# Patient Record
Sex: Male | Born: 1975
Health system: Southern US, Community
[De-identification: ages and names within clinical notes are randomized; demographics above are authoritative.]

## PROBLEM LIST (undated history)

## (undated) DIAGNOSIS — I10 Essential (primary) hypertension: Secondary | ICD-10-CM

## (undated) DIAGNOSIS — B9681 Helicobacter pylori [H. pylori] as the cause of diseases classified elsewhere: Secondary | ICD-10-CM

## (undated) DIAGNOSIS — T8859XA Other complications of anesthesia, initial encounter: Secondary | ICD-10-CM

## (undated) DIAGNOSIS — Z8489 Family history of other specified conditions: Secondary | ICD-10-CM

## (undated) DIAGNOSIS — D573 Sickle-cell trait: Secondary | ICD-10-CM

## (undated) DIAGNOSIS — G43909 Migraine, unspecified, not intractable, without status migrainosus: Secondary | ICD-10-CM

## (undated) DIAGNOSIS — M461 Sacroiliitis, not elsewhere classified: Secondary | ICD-10-CM

## (undated) DIAGNOSIS — M47819 Spondylosis without myelopathy or radiculopathy, site unspecified: Secondary | ICD-10-CM

## (undated) DIAGNOSIS — K2970 Gastritis, unspecified, without bleeding: Secondary | ICD-10-CM

## (undated) DIAGNOSIS — K5090 Crohn's disease, unspecified, without complications: Secondary | ICD-10-CM

## (undated) DIAGNOSIS — IMO0002 Reserved for concepts with insufficient information to code with codable children: Secondary | ICD-10-CM

## (undated) HISTORY — DX: Gastritis, unspecified, without bleeding: K29.70

## (undated) HISTORY — DX: Migraine, unspecified, not intractable, without status migrainosus: G43.909

## (undated) HISTORY — DX: Crohn's disease, unspecified, without complications: K50.90

## (undated) HISTORY — PX: BACK SURGERY: SHX140

## (undated) HISTORY — DX: Spondylosis without myelopathy or radiculopathy, site unspecified: M47.819

## (undated) HISTORY — DX: Reserved for concepts with insufficient information to code with codable children: IMO0002

## (undated) HISTORY — DX: Helicobacter pylori (H. pylori) as the cause of diseases classified elsewhere: B96.81

## (undated) HISTORY — DX: Sacroiliitis, not elsewhere classified: M46.1

## (undated) HISTORY — PX: COLON RESECTION: SHX5231

---

## 2005-03-08 ENCOUNTER — Ambulatory Visit (HOSPITAL_COMMUNITY): Admission: RE | Admit: 2005-03-08 | Discharge: 2005-03-08 | Payer: Self-pay | Admitting: Preventative Medicine

## 2007-06-15 DIAGNOSIS — M461 Sacroiliitis, not elsewhere classified: Secondary | ICD-10-CM

## 2007-06-15 DIAGNOSIS — B9681 Helicobacter pylori [H. pylori] as the cause of diseases classified elsewhere: Secondary | ICD-10-CM

## 2007-06-15 HISTORY — DX: Helicobacter pylori (H. pylori) as the cause of diseases classified elsewhere: B96.81

## 2007-06-15 HISTORY — DX: Sacroiliitis, not elsewhere classified: M46.1

## 2008-06-20 ENCOUNTER — Ambulatory Visit: Payer: Self-pay | Admitting: Gastroenterology

## 2008-08-15 ENCOUNTER — Ambulatory Visit: Payer: Self-pay | Admitting: Gastroenterology

## 2008-08-15 ENCOUNTER — Encounter: Payer: Self-pay | Admitting: Gastroenterology

## 2008-08-15 DIAGNOSIS — K50812 Crohn's disease of both small and large intestine with intestinal obstruction: Secondary | ICD-10-CM

## 2008-08-20 ENCOUNTER — Ambulatory Visit (HOSPITAL_COMMUNITY): Admission: RE | Admit: 2008-08-20 | Discharge: 2008-08-20 | Payer: Self-pay | Admitting: Gastroenterology

## 2008-09-30 ENCOUNTER — Telehealth (INDEPENDENT_AMBULATORY_CARE_PROVIDER_SITE_OTHER): Payer: Self-pay

## 2008-10-03 ENCOUNTER — Ambulatory Visit (HOSPITAL_COMMUNITY): Payer: Self-pay | Admitting: Gastroenterology

## 2008-10-03 ENCOUNTER — Encounter (HOSPITAL_COMMUNITY): Admission: RE | Admit: 2008-10-03 | Discharge: 2008-11-02 | Payer: Self-pay | Admitting: Gastroenterology

## 2008-10-15 ENCOUNTER — Ambulatory Visit: Payer: Self-pay | Admitting: Gastroenterology

## 2008-11-01 ENCOUNTER — Encounter (INDEPENDENT_AMBULATORY_CARE_PROVIDER_SITE_OTHER): Payer: Self-pay | Admitting: *Deleted

## 2008-11-01 ENCOUNTER — Telehealth (INDEPENDENT_AMBULATORY_CARE_PROVIDER_SITE_OTHER): Payer: Self-pay

## 2008-11-05 ENCOUNTER — Encounter: Payer: Self-pay | Admitting: Gastroenterology

## 2008-11-14 ENCOUNTER — Encounter (HOSPITAL_COMMUNITY): Admission: RE | Admit: 2008-11-14 | Discharge: 2008-12-14 | Payer: Self-pay | Admitting: Gastroenterology

## 2008-11-22 ENCOUNTER — Ambulatory Visit: Payer: Self-pay | Admitting: Gastroenterology

## 2008-11-22 DIAGNOSIS — B354 Tinea corporis: Secondary | ICD-10-CM

## 2008-11-25 ENCOUNTER — Encounter: Payer: Self-pay | Admitting: Gastroenterology

## 2009-01-13 ENCOUNTER — Encounter (INDEPENDENT_AMBULATORY_CARE_PROVIDER_SITE_OTHER): Payer: Self-pay | Admitting: *Deleted

## 2009-01-16 ENCOUNTER — Encounter (HOSPITAL_COMMUNITY): Admission: RE | Admit: 2009-01-16 | Discharge: 2009-02-15 | Payer: Self-pay | Admitting: Gastroenterology

## 2009-01-16 ENCOUNTER — Ambulatory Visit (HOSPITAL_COMMUNITY): Payer: Self-pay | Admitting: Gastroenterology

## 2009-02-04 DIAGNOSIS — F1010 Alcohol abuse, uncomplicated: Secondary | ICD-10-CM | POA: Insufficient documentation

## 2009-02-04 DIAGNOSIS — G43909 Migraine, unspecified, not intractable, without status migrainosus: Secondary | ICD-10-CM | POA: Insufficient documentation

## 2009-02-04 DIAGNOSIS — F17200 Nicotine dependence, unspecified, uncomplicated: Secondary | ICD-10-CM | POA: Insufficient documentation

## 2009-02-04 DIAGNOSIS — R1084 Generalized abdominal pain: Secondary | ICD-10-CM | POA: Insufficient documentation

## 2009-02-04 DIAGNOSIS — R634 Abnormal weight loss: Secondary | ICD-10-CM

## 2009-02-04 DIAGNOSIS — Z8719 Personal history of other diseases of the digestive system: Secondary | ICD-10-CM

## 2009-02-05 ENCOUNTER — Ambulatory Visit (HOSPITAL_COMMUNITY): Admission: RE | Admit: 2009-02-05 | Discharge: 2009-02-05 | Payer: Self-pay | Admitting: Gastroenterology

## 2009-02-05 ENCOUNTER — Ambulatory Visit: Payer: Self-pay | Admitting: Gastroenterology

## 2009-02-05 DIAGNOSIS — M549 Dorsalgia, unspecified: Secondary | ICD-10-CM | POA: Insufficient documentation

## 2009-02-07 ENCOUNTER — Encounter: Payer: Self-pay | Admitting: Gastroenterology

## 2009-02-10 ENCOUNTER — Encounter (HOSPITAL_COMMUNITY): Admission: RE | Admit: 2009-02-10 | Discharge: 2009-03-12 | Payer: Self-pay | Admitting: Gastroenterology

## 2009-02-11 ENCOUNTER — Encounter: Payer: Self-pay | Admitting: Gastroenterology

## 2009-03-13 ENCOUNTER — Ambulatory Visit (HOSPITAL_COMMUNITY): Admission: RE | Admit: 2009-03-13 | Discharge: 2009-03-13 | Payer: Self-pay | Admitting: Gastroenterology

## 2009-03-13 ENCOUNTER — Ambulatory Visit (HOSPITAL_COMMUNITY): Payer: Self-pay | Admitting: Gastroenterology

## 2009-03-19 ENCOUNTER — Telehealth: Payer: Self-pay | Admitting: Gastroenterology

## 2009-03-28 ENCOUNTER — Encounter: Payer: Self-pay | Admitting: Gastroenterology

## 2009-04-25 ENCOUNTER — Encounter: Payer: Self-pay | Admitting: Gastroenterology

## 2009-05-06 ENCOUNTER — Encounter: Payer: Self-pay | Admitting: Gastroenterology

## 2009-05-07 ENCOUNTER — Encounter (HOSPITAL_COMMUNITY): Admission: RE | Admit: 2009-05-07 | Discharge: 2009-06-06 | Payer: Self-pay | Admitting: Gastroenterology

## 2009-05-07 ENCOUNTER — Ambulatory Visit (HOSPITAL_COMMUNITY): Payer: Self-pay | Admitting: Gastroenterology

## 2009-07-03 ENCOUNTER — Encounter (HOSPITAL_COMMUNITY): Admission: RE | Admit: 2009-07-03 | Discharge: 2009-08-02 | Payer: Self-pay | Admitting: Oncology

## 2009-07-03 ENCOUNTER — Ambulatory Visit (HOSPITAL_COMMUNITY): Payer: Self-pay | Admitting: Gastroenterology

## 2009-07-15 ENCOUNTER — Telehealth (INDEPENDENT_AMBULATORY_CARE_PROVIDER_SITE_OTHER): Payer: Self-pay

## 2009-07-25 ENCOUNTER — Ambulatory Visit: Payer: Self-pay | Admitting: Gastroenterology

## 2009-07-25 DIAGNOSIS — M25539 Pain in unspecified wrist: Secondary | ICD-10-CM

## 2009-08-27 ENCOUNTER — Encounter: Payer: Self-pay | Admitting: Gastroenterology

## 2009-08-28 ENCOUNTER — Ambulatory Visit (HOSPITAL_COMMUNITY): Payer: Self-pay | Admitting: Gastroenterology

## 2009-08-28 ENCOUNTER — Encounter (HOSPITAL_COMMUNITY): Admission: RE | Admit: 2009-08-28 | Discharge: 2009-09-27 | Payer: Self-pay | Admitting: Gastroenterology

## 2009-10-14 ENCOUNTER — Encounter (INDEPENDENT_AMBULATORY_CARE_PROVIDER_SITE_OTHER): Payer: Self-pay | Admitting: *Deleted

## 2009-10-29 ENCOUNTER — Encounter: Payer: Self-pay | Admitting: Gastroenterology

## 2009-11-19 ENCOUNTER — Encounter (HOSPITAL_COMMUNITY): Admission: RE | Admit: 2009-11-19 | Discharge: 2009-12-19 | Payer: Self-pay | Admitting: Gastroenterology

## 2009-11-20 ENCOUNTER — Ambulatory Visit (HOSPITAL_COMMUNITY): Payer: Self-pay | Admitting: Gastroenterology

## 2009-11-25 ENCOUNTER — Ambulatory Visit: Payer: Self-pay | Admitting: Gastroenterology

## 2009-12-03 ENCOUNTER — Encounter: Payer: Self-pay | Admitting: Gastroenterology

## 2009-12-22 ENCOUNTER — Encounter (INDEPENDENT_AMBULATORY_CARE_PROVIDER_SITE_OTHER): Payer: Self-pay

## 2009-12-22 LAB — CONVERTED CEMR LAB
ALT: 12 units/L (ref 0–53)
AST: 25 units/L (ref 0–37)
Albumin: 4.7 g/dL (ref 3.5–5.2)
Alkaline Phosphatase: 63 units/L (ref 39–117)
Basophils Relative: 1 % (ref 0–1)
Eosinophils Absolute: 0.2 10*3/uL (ref 0.0–0.7)
HCT: 38.7 % — ABNORMAL LOW (ref 39.0–52.0)
Lymphocytes Relative: 34 % (ref 12–46)
Lymphs Abs: 2.7 10*3/uL (ref 0.7–4.0)
Monocytes Absolute: 0.5 10*3/uL (ref 0.1–1.0)
Monocytes Relative: 6 % (ref 3–12)
Platelets: 267 10*3/uL (ref 150–400)
RDW: 16.3 % — ABNORMAL HIGH (ref 11.5–15.5)
Total Protein: 7.8 g/dL (ref 6.0–8.3)

## 2010-01-22 ENCOUNTER — Ambulatory Visit (HOSPITAL_COMMUNITY): Payer: Self-pay | Admitting: Oncology

## 2010-01-22 ENCOUNTER — Encounter (HOSPITAL_COMMUNITY): Admission: RE | Admit: 2010-01-22 | Discharge: 2010-02-21 | Payer: Self-pay | Admitting: Oncology

## 2010-03-18 ENCOUNTER — Encounter (HOSPITAL_COMMUNITY)
Admission: RE | Admit: 2010-03-18 | Discharge: 2010-04-17 | Payer: Self-pay | Source: Home / Self Care | Admitting: Gastroenterology

## 2010-03-18 ENCOUNTER — Ambulatory Visit (HOSPITAL_COMMUNITY): Payer: Self-pay | Admitting: Gastroenterology

## 2010-05-13 ENCOUNTER — Encounter (HOSPITAL_COMMUNITY)
Admission: RE | Admit: 2010-05-13 | Discharge: 2010-06-12 | Payer: Self-pay | Source: Home / Self Care | Attending: Gastroenterology | Admitting: Gastroenterology

## 2010-05-13 ENCOUNTER — Ambulatory Visit (HOSPITAL_COMMUNITY): Payer: Self-pay | Admitting: Gastroenterology

## 2010-07-08 ENCOUNTER — Encounter (HOSPITAL_COMMUNITY): Admission: RE | Admit: 2010-07-08 | Payer: Self-pay | Source: Home / Self Care | Admitting: Gastroenterology

## 2010-07-08 ENCOUNTER — Emergency Department (HOSPITAL_COMMUNITY)
Admission: EM | Admit: 2010-07-08 | Discharge: 2010-07-08 | Payer: Self-pay | Source: Home / Self Care | Admitting: Emergency Medicine

## 2010-07-10 ENCOUNTER — Ambulatory Visit (HOSPITAL_COMMUNITY): Admit: 2010-07-10 | Payer: Self-pay | Admitting: Gastroenterology

## 2010-07-14 NOTE — Letter (Signed)
Summary: Plan of Care, Need to [facility]  [address]   [place], [place] [address]   Phone: [phone]  Fax: [phone]    December 22, 2009  Joshua Robertson [address] [place], [place]  [address] January 12, 1976   Dear Joshua Robertson,   We are writing this letter to inform you of treatment plans and/or discuss your plan of care.  We have tried several times to contact you; however, we have yet to reach you. Please call and give a an up to date phone number that you can be reached on.  Please do not neglect your health.   Sincerely,    Waldon Merl LPN  [facility] Ph: [phone]    Fax: [phone]

## 2010-07-14 NOTE — Progress Notes (Signed)
Summary: hand swelling  Phone Note Call from Patient Call back at [phone]   Caller: Mom Summary of Call: pts mom called- pt has ov on 07/25/09. came to her yesterday with whole hand swelling and in alot of pain. Stated he didnt remember hitting it or having any accidents and wants to know if this is a side effect of his crohns. Mom wants to know if he should go have an xray just to make sure its not broken. please advise. Initial call taken by: Burnadette Peter LPN,  July 15, 120 10:22 AM     Appended Document: hand swelling Please call pt. He should go to the ED if he does not have a primary doctor.   Appended Document: hand swelling pts mother- Mariann Laster is aware and will get pt to go to his PCP

## 2010-07-14 NOTE — Assessment & Plan Note (Signed)
Summary: SMALL BOWEL CROHN'S   Visit Type:  Follow-up Visit Primary Care Provider:  Scotty Robertson, M.D.  Chief Complaint:  follow up- doing ok.  History of Present Illness: Still getting Remicade. Saw Rheumatology. Back pain and wrist pain are gone. No wrist fracture. Appetite: okay. BMs: 2-3 a day. Rare nocturnal stools. Gained 7 lbs since last visit but was a little heavier. Still no Medicaid. No blood draw in 2011.  Current Medications (verified): 1)  Remicade 100 Mg Solr (Infliximab) .... As Directed 2)  Ultram 50 Mg Tabs (Tramadol Hcl) .Marland Kitchen.. 1 By Mouth Q6h As Needed Pain 3)  Benazepril Hcl 40 Mg Tabs (Benazepril Hcl) .... Once Daily  Allergies (verified): 1)  ! Pcn 2)  ! Imuran (Azathioprine)  Past History:  Past Surgical History: Last updated: 10/15/2008 None  Social History: Last updated: 07/25/2009 Still not working. Medicaid cancelled.   Past Medical History: Crohns Disease, Dx: AUG 2009 (NUR)  **Hb 9.4,  MCV of 66.9, Ferritin of 26 TCS-. TI could not be intubated, ICV Bx-acute colitis, ulcerations nonspecific, CT AP:   thickened terminal ileum asymmetric, 1cm mural   thickness. IMURAN CAUSED VOMITING--> ON REMICADE CTE JUN 2010: bowel wall thickening and transmural enhancement of thedistal ileum compatible with active Crohn's disease. No disease progression, abscess or fistula formation identified. SACROILEITIS AUG 2009 **CT AP: erosive  changes of the SI joints bilaterally--> 2010: Moderate bilateral sacroiliitis. H. pylori gastritis **August 2009: EGD/Bx  Migarines  Vital Signs:  Patient profile:   35 year old male Height:      69 inches Weight:      170 pounds BMI:     25.20 Temp:     98.0 degrees F oral Pulse rate:   64 / minute BP sitting:   118 / 84  (left arm) Cuff size:   regular  Vitals Entered By: Burnadette Peter LPN (November 26, 7351 2:99 AM)  Physical Exam  General:  Well developed, well nourished, no acute distress. Head:  Normocephalic and  atraumatic. Eyes:  PERRLA, no icterus. Mouth:  No deformity or lesions. Lungs:  Clear throughout to auscultation. Heart:  Regular rate and rhythm; no murmurs. Abdomen:  Soft, nontender and nondistended. Normal bowel sounds. Extremities:  No edema or deformities noted. Neurologic:  Alert and  oriented x4;  grossly normal neurologically.  Impression & Recommendations:  Problem # 1:  CROHN'S DISEASE-SMALL INTESTINE (ICD-555.0)  OPV in 4 mos. Continue Remicade. Check CBC and HFP twice a year. Call Remicade patient assistance and renew his program Last renewed 01/06/09.   CC: PCP  Orders: T-CBC w/Diff ([id number]) T-Hepatic Function [phone]) Est. Patient Level III (19417)  ADDENDUM 40814: Hb 12.4, MCV 73.9, nl HFP-recommend oral iron supplementation. Consider CT AP.

## 2010-07-14 NOTE — Letter (Signed)
Summary: OFFICE NOTE/[facility]  OFFICE NOTE/[facility]   Imported By: Zeb Comfort 07/03/2009 10:04:53  _____________________________________________________________________  External Attachment:    Type:   Image     Comment:   External Document

## 2010-07-14 NOTE — Letter (Signed)
Summary: Recall Office Visit  [facility]  [address]   [place], [place] [address]   Phone: [phone]  Fax: [phone]      Oct 14, 2009   HUTCH RHETT [address] [place], [place]  [address] 1976-02-11   Dear Mr. KLONOWSKI,   According to our records, it is time for you to schedule a follow-up office visit with [place].   At your convenience, please call [phone] to schedule an office visit. If you have any questions, concerns, or feel that this letter is in error, we would appreciate your call.   Sincerely,    [facility] Ph: [phone]   Fax: [phone]

## 2010-07-14 NOTE — Medication Information (Signed)
Summary: Visual [employer]   Imported By: Orma Flaming 12/03/2009 10:26:05  _____________________________________________________________________  External Attachment:    Type:   Image     Comment:   External Document

## 2010-07-14 NOTE — Assessment & Plan Note (Signed)
Summary: SM BWL CROHNS   Visit Type:  Follow-up Visit  Chief Complaint:  follow up- doing good.  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 BMs: 1-2/dy, soft. No blood or nocturnal stools. Appetite: pretty good. Back pain is better. Pending appt with Rheumatology. Medicaid cut off. Last Remicade within the last 2-3 weeks.     Current Medications (verified): 1)  Remicade 100 Mg Solr (Infliximab) .... As Directed 2)  Ultram 50 Mg Tabs (Tramadol Hcl) .Marland Kitchen.. 1 By Mouth Q6h As Needed Pain  Allergies (verified): 1)  ! Pcn  Past History:  Past Medical History: Last updated: 10/15/2008 Crohns Disease Migarines  Social History: Still not working. Medicaid cancelled.   Vital Signs:  Patient profile:   35 year old male Height:      69 inches Weight:      163 pounds BMI:     24.16 Temp:     98.0 degrees F oral Pulse rate:   64 / minute BP sitting:   128 / 88  (left arm) Cuff size:   regular  Vitals Entered By: Burnadette Peter LPN (July 25, 9516 10:39 AM)  Physical Exam  General:  Well developed, well nourished, no acute distress. Head:  Normocephalic and atraumatic. Lungs:  Clear throughout to auscultation. Heart:  Regular rate and rhythm; no murmurs, rubs,  or bruits. Abdomen:  Soft, nontender and nondistended. Normal bowel sounds. Msk:  R wrist no deformity  Impression & Recommendations:  Problem # 1:  CROHN'S DISEASE-SMALL INTESTINE (ICD-555.0) Assessment Improved  Continue Remicade. OPV in 3 mos and will need to re-apply for Remicade MAY 2010. Pt will re-apply for Mediacid and referred to financial counselling at [facility].  Orders: Est. Patient Level III (84166)  Problem # 2:  BACK PAIN (ICD-724.5) Assessment: Improved ? ankylosing spondylitis. Rheum appt pending at [facility].   Problem # 3:  WRIST PAIN, RIGHT (ICD-719.43) Assessment: Improved Swelling resolved. Will monitor. Obtain records from [facility]. FAX note to  [facility].  Appended Document: SM BWL CROHNS Pt dropped off ppwk for patient assistance with Remicade. Physician portion filled out. Pt asked to submit signatures and ppwk to complete the process.  Appended Document: SM BWL CROHNS tried to call pt about forms for pt assistance. LM for return call

## 2010-07-14 NOTE — Letter (Signed)
Summary: DISABILITY CLAIM  DISABILITY CLAIM   Imported By: Stephan Minister 08/27/2009 13:56:38  _____________________________________________________________________  External Attachment:    Type:   Image     Comment:   External Document  Appended Document: DISABILITY CLAIM called pt with number he left. it was disconnected. called number that was on claim forms it wasnt his number. called emergency contact and left message with her.

## 2010-07-14 NOTE — Medication Information (Signed)
Summary: pt assistance paperwork  pt assistance paperwork   Imported By: Burnadette Peter LPN 45/85/9292 44:62:86  _____________________________________________________________________  External Attachment:    Type:   Image     Comment:   External Document

## 2010-07-15 ENCOUNTER — Encounter (HOSPITAL_COMMUNITY): Payer: Self-pay | Attending: Gastroenterology

## 2010-07-15 ENCOUNTER — Ambulatory Visit (HOSPITAL_COMMUNITY): Payer: Self-pay

## 2010-07-15 DIAGNOSIS — K5090 Crohn's disease, unspecified, without complications: Secondary | ICD-10-CM | POA: Insufficient documentation

## 2010-07-24 ENCOUNTER — Encounter (INDEPENDENT_AMBULATORY_CARE_PROVIDER_SITE_OTHER): Payer: Self-pay

## 2010-07-24 ENCOUNTER — Telehealth (INDEPENDENT_AMBULATORY_CARE_PROVIDER_SITE_OTHER): Payer: Self-pay

## 2010-07-30 NOTE — Progress Notes (Addendum)
Summary: phone note/ pt needs OV to continue Remicade  Phone Note Outgoing Call   Call placed by: Blane Worthington Call placed to: Patient Summary of Call: Called to tell pt he needs OV per Laban Emperor, NP. Will do CBC and HFP @ OV...then continue Remicade.  Many rings and no answer.  Will mail letter to call.  Initial call taken by: Waldon Merl LPN,  July 24, 7586 3:23 PM     Appended Document: phone note/ pt needs OV to continue Remicade Attempted call again, many rings and no answer.

## 2010-07-30 NOTE — Letter (Signed)
Summary: Plan of Care, Need to [facility]  [address]   [place], [place] [address]   Phone: [phone]  Fax: [phone]    July 24, 2010  TODDY BOYD [address] [place], [place]  [address] [birth year]   Dear Mr. DELORIA,   We are writing this letter to inform you of treatment plans and/or discuss your plan of care.  We have tried several times to contact you; however, we have yet to reach you.  We ask that you please contact our office for follow-up on your gastrointestinal issues.  We can  be reached at [phone] to schedule an appointment, or to speak with someone regarding your health care needs.  Please do not neglect your health.   Sincerely,    Waldon Merl LPN  [facility] Ph: [phone]    Fax: [phone]

## 2010-09-08 ENCOUNTER — Ambulatory Visit (HOSPITAL_COMMUNITY): Payer: Self-pay

## 2010-09-15 ENCOUNTER — Encounter (HOSPITAL_COMMUNITY): Payer: Self-pay | Attending: Gastroenterology

## 2010-09-15 DIAGNOSIS — K5090 Crohn's disease, unspecified, without complications: Secondary | ICD-10-CM | POA: Insufficient documentation

## 2010-09-16 ENCOUNTER — Encounter (HOSPITAL_COMMUNITY): Payer: Self-pay

## 2010-10-22 ENCOUNTER — Encounter: Payer: Self-pay | Admitting: Gastroenterology

## 2010-10-22 ENCOUNTER — Ambulatory Visit (INDEPENDENT_AMBULATORY_CARE_PROVIDER_SITE_OTHER): Payer: Self-pay | Admitting: Gastroenterology

## 2010-10-22 DIAGNOSIS — I10 Essential (primary) hypertension: Secondary | ICD-10-CM | POA: Insufficient documentation

## 2010-10-22 DIAGNOSIS — K5000 Crohn's disease of small intestine without complications: Secondary | ICD-10-CM

## 2010-10-22 NOTE — Assessment & Plan Note (Signed)
Pt didn't take BP pills this AM. No Sx of end organ disease.  Go straight home and take BP pills. Pt voiced understanding.

## 2010-10-22 NOTE — Progress Notes (Signed)
Reminder in epic to follow up in 6 months °

## 2010-10-22 NOTE — Progress Notes (Deleted)
  Subjective:    Patient ID: Joshua Robertson, male    DOB: 1976/05/26, 35 y.o.   MRN: [id number]  HPI    Review of Systems     Objective:   Physical Exam        Assessment & Plan:

## 2010-10-22 NOTE — Progress Notes (Signed)
  Subjective:    Patient ID: Joshua Robertson, male    DOB: May 03, 1976, 35 y.o.   MRN: [id number]  PCP: TYSINGER/TAPPER  HPI A little stiffness every now and then. Bms: every day. Nocturnal: formed, a few soft. Frequency depends on what he's eating. No loose stools. Getting BP meds from [place] in Dr, [employer]. Appetite: good. Energy level: good. Has a part time job in [place], [place]. Next Remicade-5/29. Smoking 1pk q3days.   Past Medical History  Diagnosis Date  . Helicobacter pylori gastritis 2009  . Spondyloarthropathy 2009 EROSIVE  . Sacroiliitis 2009  . Migraine headache     NONE SINCE AGE 13  . BMI between 19-24,adult JAN 2010 149 LBS  . Crohn's disease AUG 2009 NUR [facility]    HB 9.4 FERRITIN 26  140 LBS; no serologies-pt can't afford test   History reviewed. No pertinent past surgical history.  Allergies  Allergen Reactions  . Azathioprine     REACTION: VOMITING  . Penicillins Swelling and Rash   Current Outpatient Prescriptions  Medication Sig Dispense Refill  . benazepril (LOTENSIN) 40 MG tablet Take 40 mg by mouth daily.        Marland Kitchen inFLIXimab (REMICADE) 100 MG injection Inject into the vein.         Review of Systems  All other systems reviewed and are negative.       Objective:   Physical Exam  Constitutional: He is oriented to person, place, and time. He appears well-developed and well-nourished. No distress.  HENT:  Head: Normocephalic and atraumatic.  Mouth/Throat: Oropharynx is clear and moist.       No lesions  Eyes: Pupils are equal, round, and reactive to light.  Cardiovascular: Normal rate and regular rhythm.   Pulmonary/Chest: Effort normal and breath sounds normal.  Abdominal: Soft. Bowel sounds are normal. There is no tenderness.  Musculoskeletal: He exhibits no edema.  Neurological: He is alert and oriented to person, place, and time.  Skin: No rash noted.  Psychiatric: He has a normal mood and affect.          Assessment & Plan:

## 2010-10-22 NOTE — Assessment & Plan Note (Signed)
Sx controlled.  Continue Remicade, Stop smoking. Avoid NSAIDs. OPV in 6 mos.

## 2010-10-22 NOTE — Progress Notes (Signed)
No PCP on record

## 2010-10-22 NOTE — Progress Notes (Signed)
Cc to Dr. Eligha Bridegroom

## 2010-10-27 NOTE — Assessment & Plan Note (Signed)
Joshua Robertson, Robertson                   CHART#:  [id number]   DATE:  06/20/2008                       DOB:  Feb 13, 1976   REASON FOR VISIT:  Abdominal pain and bloody stools.   HISTORY OF PRESENT ILLNESS:  The patient is a 35 year old male who  presents as a new patient visit.  He was seen and evaluated by Dr.  Laural Golden in [employer] in August 2009 for abdominal pain for 2 months.  He was  also complaining of rectal bleeding.  He had a 40-pound weight loss.  He  was seen in the emergency department and his hemoglobin was 9.4 with an  MCV of 66.9.  He was scheduled for an upper endoscopy and a colonoscopy  and his lab evaluation was performed as well.  He had a ferritin of 26  (24-336).  He had an upper endoscopy and a colonoscopy performed in  August 2009.  The stomach biopsy showed H. pylori gastritis.  His  terminal ileum could not be intubated and biopsies of the ileocecal  valve showed acute colitis with ulcerations, which was nonspecific.  KOH  prep of the esophagus was normal.  He was started on nystatin swish and  swallow and abdominal CT scan was ordered.  The CT scan of the abdomen  and pelvis showed a thickened terminal ileum asymmetric with mural  thickness up to 1 cm.  The differential diagnosis include an  inflammatory bowel disease and lymphoma infection.  He also had erosive  changes of the SI joints bilaterally suggestive of a neuropathic  spondyloarthropathy.  Notes from February 09, 2008, showed the patient was  started on Cipro and prednisone.  His weight was now 140 pounds.  He was  seen again in followup in September 2009.  He was started on Imuran 50  mg a day and continued on the prednisone taper.  No notation is made  that he ever received treatment for H. pylori gastritis.  In October  2009, he had a small bowel follow-through.  The small bowel series  showed erosive sacroiliitis and mass effect of the distal and terminal  ileum with thickening of the wall with separation of  loops.  Narrowing  of the lumen of the distal and terminal ileum appeared consistent with  Crohn disease.  However, again the possibility of lymphoma infection was  in the differential diagnosis.  The radiologist say they were Crohn  disease.  He had labs in October 2009, which showed a white count of  9.7, hemoglobin 10.6, and a platelet count of 362.  He also had  schistocyte.  While taking Imuran he had problems with vomiting.  He  stopped the Imuran around Thanksgiving and his vomiting has resolved.  His last dose of prednisone was around Christmas.  He has several  questions.  He wants to know what he can eat and what he can take for  pain for his headache.  He wants to know how he identify flares.  He  wants to know if he can prevent flares.  He wants to know the  complications of Crohn disease.  He wants to know if it is genetic.  He  wants to know if he needs another colonoscopy.   He has abdominal pain 4 out of 7 days.  Two days may be really  bad.  He  has never had any exposure to TB.  He had side effects from the  prednisone to include mood swings.  He complains of fatigue.  He has not  seen any rectal bleeding for 2-3 weeks.  He does have night sweats 3-4  times a week.  He has frequent urination.  He reports having  constipation now.  He may go 2-3 days without a bowel movement.  His  stools are either hard or soft.  He does describe dull cramp in the  right side of his abdomen that can increase to such severity that he  feels like there is a knot there.  He feels this sensation mostly all  day.  The pain can keep him awake 1-2 nights a week.  He is primarily  bothered by the night sweats.  He rarely has nausea.  He has no problems  swallowing.  He has no heartburn and indigestion.  He was having  problems with sour belches, but that has now gone.  He has not traveled  anywhere or been on any antibiotics other than Cipro.  He denies any  pelvic pain, fever, or diarrhea.  He  has no sores in his mouth or rash  in his legs.   PAST MEDICAL HISTORY:  Migraines (none since age 8 or 21).   PAST SURGICAL HISTORY:  None.   ALLERGIES:  Penicillin causes his face to swell and rashes.   MEDICATIONS:  None.   FAMILY HISTORY:  He denies any family history of colon cancer, colon  polyps, Crohn disease, or ulcerative colitis.   SOCIAL HISTORY:  He lives with his girlfriend and his little girl.  She  is currently unemployed.  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  He does smoke less than half a pack a day.  He rarely  consumes alcohol.   REVIEW OF SYSTEMS:  As per the HPI, otherwise all systems are negative.   PHYSICAL EXAMINATION:  VITAL SIGNS:  Weight 149 pounds (up 13 pounds  since August 2009), BMI 22 (healthy), temperature 98.4, blood pressure  142/94, and pulse 80.GENERAL:  He is in no apparent distress.  Alert and  oriented x4.HEENT:  Atraumatic, normocephalic.  Pupils equal and  reactive to light.  Mouth, no oral lesions.  Posterior pharynx without  erythema or exudate.  NECK:  Full range of motion.  No lymphadenopathy.LUNGS:  Clear to  auscultation bilaterally.CARDIOVASCULAR:  Regular rhythm.  No murmur.  Normal S1 and S2.ABDOMEN:  Bowel sounds are present, soft, nondistended,  nontender.  No rebound or guarding.EXTREMITIES:  No cyanosis or  edema.SKIN:  No pretibial lesions.NEUROLOGIC:  No focal deficits.   ASSESSMENT:  The patient is a 35 year old male who presented with  abdominal pain and bloody diarrhea in August 2009.  His working  diagnosis is Crohn disease, but there is no biopsy to confirm the  diagnosis.  Clinically, he appears to have Crohn disease, which is  partially controlled based on history, physical, and imaging.  The  differential diagnosis still includes lymphoma and a low likelihood of  infection.  Some of his abdominal pain and rare nausea could be due to  small bowel strictures or adhesions.  Thank you for allowing  me to see  the patient in consultation.  He also has no insurance and pays cash for  medications.  He appears to have had an adverse event with the addition  of Imuran, which includes vomiting.   PLAN:  1. He should follow a low-residue diet.  He is given a handout on a      residue diet.  I explained to him that he will know if he has a      flare by increase in number of stools, pain, blood, and fever as      well as abdominal pain.  I explained to him that he would not be in      pain all the time, and if we can find a medication to control his      symptoms.  I told him that his child is not necessarily point going      to be affected with Crohn disease.  I briefly explained some      complications of Crohn disease, which includes a sacroiliitis,      joint pain, joint swelling, arthritis, and liver disease..  2. I told him that to prevent flares, he can stop smoking.  3. He should add calcium with vitamin D twice a day to help prevent      bone loss.  4. I did give him the patient's assistance forms for Entocort as well      as Remicade.  I spoke with the [employer] Rep and no patient's      assistance is available for Entocort, but she did provide me with      144 pills.  Will wait to hear from the Remicade Rep as to whether      or not he can have assistance and in the meantime, try to control      his disease with Entocort since prednisone caused him to have mood      swings.  The discussion with the [employer] rep occurred after the      patient had left the office.  5. He will begin prednisone today and alternate with Entocort.  His      prednisone dose will be 30 mg daily beginning on the 7th.  He will      begin 9 mg of Entocort on the 14th, then 20 mg of prednisone on      21st, 9 mg of Entocort on 28, 20 mg of prednisone on the 4th, 9 mg      of Entocort on the 11th, and then 10 mg of prednisone on the 18th.      He has a follow up appointment to see me in 1 month and  hopefully,      the issues with the patient's assistance can be resolved by then.      If not, he will continue to be on Entocort and will stop the      prednisone.  I also recommended to him that he check into whether      or not he qualifies for Medicaid.   ADDENDUM 2910:  OPV 2/17       Caro Hight, M.D.  Electronically Signed     SM/MEDQ  D:  06/20/2008  T:  06/20/2008  Job:  [id number]

## 2010-11-10 ENCOUNTER — Ambulatory Visit (HOSPITAL_COMMUNITY): Payer: Self-pay

## 2010-11-19 ENCOUNTER — Encounter (HOSPITAL_COMMUNITY): Payer: Self-pay | Attending: Gastroenterology

## 2010-11-19 DIAGNOSIS — K5090 Crohn's disease, unspecified, without complications: Secondary | ICD-10-CM | POA: Insufficient documentation

## 2011-01-14 ENCOUNTER — Ambulatory Visit (HOSPITAL_COMMUNITY): Payer: Self-pay

## 2011-01-14 ENCOUNTER — Encounter (HOSPITAL_COMMUNITY): Payer: Self-pay | Attending: Gastroenterology

## 2011-01-14 VITALS — BP 177/108 | HR 61 | Temp 97.2°F | Wt 160.3 lb

## 2011-01-14 DIAGNOSIS — K5000 Crohn's disease of small intestine without complications: Secondary | ICD-10-CM | POA: Insufficient documentation

## 2011-01-14 MED ORDER — SODIUM CHLORIDE 0.9 % IV SOLN
INTRAVENOUS | Status: DC
  Administered 2011-01-14: 500 mL via INTRAVENOUS

## 2011-01-14 MED ORDER — SODIUM CHLORIDE 0.9 % IV SOLN
400.0000 mg | INTRAVENOUS | Status: DC
  Administered 2011-01-14: 400 mg via INTRAVENOUS
  Filled 2011-01-14: qty 40

## 2011-01-14 MED ORDER — SODIUM CHLORIDE 0.9 % IJ SOLN
INTRAMUSCULAR | Status: AC
  Administered 2011-01-14: 10 mL via INTRAVENOUS
  Filled 2011-01-14: qty 10

## 2011-01-14 MED ORDER — SODIUM CHLORIDE 0.9 % IJ SOLN
10.0000 mL | Freq: Once | INTRAMUSCULAR | Status: AC
  Administered 2011-01-14: 10 mL via INTRAVENOUS

## 2011-03-11 ENCOUNTER — Ambulatory Visit (HOSPITAL_COMMUNITY): Payer: Self-pay

## 2011-03-12 ENCOUNTER — Ambulatory Visit (HOSPITAL_COMMUNITY): Payer: Self-pay

## 2011-03-12 ENCOUNTER — Emergency Department (HOSPITAL_COMMUNITY)
Admission: EM | Admit: 2011-03-12 | Discharge: 2011-03-12 | Disposition: A | Discharge status: Home / Self Care | Payer: Self-pay | Attending: Emergency Medicine | Admitting: Emergency Medicine

## 2011-03-12 ENCOUNTER — Encounter (HOSPITAL_COMMUNITY): Payer: Self-pay | Admitting: *Deleted

## 2011-03-12 DIAGNOSIS — H00019 Hordeolum externum unspecified eye, unspecified eyelid: Secondary | ICD-10-CM | POA: Insufficient documentation

## 2011-03-12 DIAGNOSIS — K5090 Crohn's disease, unspecified, without complications: Secondary | ICD-10-CM | POA: Insufficient documentation

## 2011-03-12 DIAGNOSIS — H01006 Unspecified blepharitis left eye, unspecified eyelid: Secondary | ICD-10-CM

## 2011-03-12 DIAGNOSIS — F17200 Nicotine dependence, unspecified, uncomplicated: Secondary | ICD-10-CM | POA: Insufficient documentation

## 2011-03-12 MED ORDER — TETRACAINE HCL 0.5 % OP SOLN
2.0000 [drp] | Freq: Once | OPHTHALMIC | Status: AC
  Administered 2011-03-12: 2 [drp] via OPHTHALMIC
  Filled 2011-03-12: qty 2

## 2011-03-12 MED ORDER — FLUORESCEIN SODIUM 1 MG OP STRP
1.0000 | ORAL_STRIP | Freq: Once | OPHTHALMIC | Status: AC
  Administered 2011-03-12: 18:00:00 via OPHTHALMIC
  Filled 2011-03-12: qty 1

## 2011-03-12 MED ORDER — ERYTHROMYCIN 5 MG/GM OP OINT: TOPICAL_OINTMENT | OPHTHALMIC | Status: AC

## 2011-03-12 NOTE — ED Notes (Signed)
Pt c/o itching and swelling to left eye; pt states it started out x 1 day ago with a sty to the left eye

## 2011-03-12 NOTE — ED Provider Notes (Signed)
History     CSN: [id number] Arrival date & time: 03/12/2011  4:50 PM  Chief Complaint  Patient presents with  . Eye Pain    left eye  . Facial Swelling    (Consider location/radiation/quality/duration/timing/severity/associated sxs/prior treatment) HPI Comments: The patient is a 35 year old male who presents for evaluation of inflammation and swelling, irritation to the left lower eyelid at the medial border, where an apparent hordeoleum or stye is present. He reports he noticed this swelling irritation yesterday. He has had some increase in tearing from that eye, but no significant or purulent discharge from the eye. He has no pain with eye movement. He denies any injury to the. He denies any other symptoms such as rhinorrhea, nasal congestion, sore throat, or earache. He denies any change in visual acuity.  Patient is a 35 y.o. male presenting with eye pain. The history is provided by the patient.  Eye Pain This is a new problem. The current episode started yesterday. The problem occurs constantly. The problem has not changed since onset.Pertinent negatives include no chest pain, no abdominal pain, no headaches and no shortness of breath. Exacerbated by: Palpation. The symptoms are relieved by nothing. He has tried nothing for the symptoms.    Past Medical History  Diagnosis Date  . Helicobacter pylori gastritis 2009  . Spondyloarthropathy 2009 EROSIVE  . Sacroiliitis 2009  . Migraine headache     NONE SINCE AGE 89  . BMI between 19-24,adult JAN 2010 149 LBS  . Crohn's disease AUG 2009 NUR [facility]    HB 9.4 FERRITIN 26  140 LBS; no serologies-pt can't afford test    History reviewed. No pertinent past surgical history.  Family History  Problem Relation Age of Onset  . Colon cancer Neg Hx   . Colon polyps Neg Hx   . Inflammatory bowel disease Neg Hx     History  Substance Use Topics  . Smoking status: Current Everyday Smoker -- 1.0 packs/day    Types: Cigarettes  . Smokeless  tobacco: Not on file  . Alcohol Use: No      Review of Systems  Constitutional: Negative for fever and chills.  HENT: Negative for hearing loss, ear pain, congestion, sore throat, facial swelling, rhinorrhea and ear discharge.   Eyes: Positive for pain and itching. Negative for photophobia, discharge, redness and visual disturbance.  Respiratory: Negative for shortness of breath.   Cardiovascular: Negative for chest pain.  Gastrointestinal: Negative for abdominal pain.  Musculoskeletal: Negative.   Skin: Negative for rash.  Neurological: Negative for headaches.  Hematological: Negative for adenopathy.  Psychiatric/Behavioral: Negative.     Allergies  Azathioprine and Penicillins  Home Medications   Current Outpatient Rx  Name Route Sig Dispense Refill  . BENAZEPRIL HCL 40 MG PO TABS Oral Take 40 mg by mouth daily.      . INFLIXIMAB 100 MG IV SOLR Intravenous Inject into the vein.      Marland Kitchen ERYTHROMYCIN 5 MG/GM OP OINT  Place a 1/2 inch ribbon of ointment into the lower eyelid 4 times daily for one week, then once nightly for 4 weeks. 3.5 g 0    BP 157/103  Pulse 77  Temp(Src) 99 F (37.2 C) (Oral)  Resp 16  Ht 5' 9"  (1.753 m)  Wt 165 lb (74.844 kg)  BMI 24.37 kg/m2  SpO2 100%  Physical Exam  Nursing note and vitals reviewed. Constitutional: He is oriented to person, place, and time. He appears well-developed and well-nourished. No distress.  HENT:  Head: Normocephalic and atraumatic.  Right Ear: External ear normal.  Left Ear: External ear normal.  Nose: Nose normal.  Mouth/Throat: Oropharynx is clear and moist. No oropharyngeal exudate.  Eyes: Conjunctivae and EOM are normal. Pupils are equal, round, and reactive to light. Right eye exhibits no chemosis, no discharge, no exudate and no hordeolum. No foreign body present in the right eye. Left eye exhibits hordeolum. Left eye exhibits no chemosis, no discharge and no exudate. No foreign body present in the left eye. No  scleral icterus.    Neck: Normal range of motion. Neck supple.  Cardiovascular: Normal rate and regular rhythm.   Pulmonary/Chest: Effort normal and breath sounds normal.  Musculoskeletal: Normal range of motion. He exhibits no edema and no tenderness.  Lymphadenopathy:    He has no cervical adenopathy.  Neurological: He is alert and oriented to person, place, and time. He displays normal reflexes. No cranial nerve deficit. He exhibits normal muscle tone. Coordination normal.  Skin: Skin is warm and dry. No rash noted.  Psychiatric: He has a normal mood and affect. His behavior is normal. Judgment and thought content normal.    ED Course  Procedures (including critical care time)  Labs Reviewed - No data to display No results found.   No diagnosis found.    MDM  A slit-lamp exam was performed by me revealing no findings of the cornea or anterior chamber of the eyes. The patient has an apparent uncomplicated left lower lid hordeoleum. I will treat him with warm compresses and erythromycin ophthalmic ointment.       Charlena Cross, MD 03/12/11 [phone]

## 2011-03-16 ENCOUNTER — Encounter: Payer: Self-pay | Admitting: Gastroenterology

## 2011-04-21 ENCOUNTER — Ambulatory Visit (INDEPENDENT_AMBULATORY_CARE_PROVIDER_SITE_OTHER): Payer: Self-pay | Admitting: Gastroenterology

## 2011-04-21 ENCOUNTER — Encounter: Payer: Self-pay | Admitting: Gastroenterology

## 2011-04-21 VITALS — BP 156/96 | HR 72 | Temp 98.0°F | Ht 69.0 in | Wt 158.0 lb

## 2011-04-21 DIAGNOSIS — K5000 Crohn's disease of small intestine without complications: Secondary | ICD-10-CM

## 2011-04-21 NOTE — Progress Notes (Signed)
Reminder in epic to follow up in 6 months °

## 2011-04-21 NOTE — Progress Notes (Signed)
Cc to PCP 

## 2011-04-21 NOTE — Assessment & Plan Note (Addendum)
RESTART REMICADE 400 MG IV Q8 WEEKS CHECK CBC/DIFF, & CMP. FOLLOW UP WITH PCP IF COUGH BECOMES PRODUCTIVE. USE ROBITUSSIN DM FOR COUGH AND TYLENOL PRN PAIN. OPV IN 6 MOS.

## 2011-04-21 NOTE — Patient Instructions (Signed)
Get your labs drawn. START REMICADE THIS WEEK. FOLLOW UP IN 6 MOS. SEE PCP IF COUGH PRODUCES YELLOW MUCOUS OR YOU DEVELOP CHEST PAIN OR SOB.

## 2011-04-21 NOTE — Progress Notes (Signed)
  Subjective:    Patient ID: Joshua Robertson, male    DOB: [birth year], 35 y.o.   MRN: [id number]  MNO:[id number]  HPI Has a dry to slightly productive cough today. No runny nose, chest pain or SOB.  No fever or chills. Rare abd pain. Bowels soft-sml amount of blood about 1 week. NO REMICADE SINCE AUG because he was told to see me before they could continue. No paperwork to renew Remicade assistance. No back pain, joint pain, or buttock pain  Past Medical History  Diagnosis Date  . Helicobacter pylori gastritis 2009  . Spondyloarthropathy 2009 EROSIVE  . Sacroiliitis 2009  . Migraine headache     NONE SINCE AGE 35  . BMI between 19-24,adult JAN 2010 149 LBS  . Crohn's disease AUG 2009 NUR [facility]    HB 9.4 FERRITIN 26  140 LBS; no serologies-pt can't afford test    No past surgical history on file.  Allergies  Allergen Reactions  . Azathioprine     REACTION: VOMITING  . Penicillins Swelling and Rash    Current Outpatient Prescriptions  Medication Sig Dispense Refill  . benazepril (LOTENSIN) 40 MG tablet Take 40 mg by mouth daily.        Marland Kitchen inFLIXimab (REMICADE) 400 MG injection Q8WEEKS           Review of Systems LAST CBC June 2011 HB 12.4 NL HFP    Objective:   Physical Exam  Vitals reviewed. Constitutional: He is oriented to person, place, and time. He appears well-developed and well-nourished. No distress.  HENT:  Head: Normocephalic and atraumatic.  Mouth/Throat: Oropharynx is clear and moist. No oropharyngeal exudate.  Eyes: Pupils are equal, round, and reactive to light.  Neck: Normal range of motion. Neck supple.  Cardiovascular: Normal rate, regular rhythm and normal heart sounds.   Pulmonary/Chest: Effort normal and breath sounds normal.  Abdominal: Soft. Bowel sounds are normal. He exhibits no distension. There is no tenderness.  Musculoskeletal: Normal range of motion. He exhibits no edema.  Lymphadenopathy:    He has no cervical adenopathy.  Neurological: He is  alert and oriented to person, place, and time.  Skin:       NO PRE-TIBIAL LESIONS  Psychiatric: He has a normal mood and affect.          Assessment & Plan:

## 2011-04-22 LAB — CBC WITH DIFFERENTIAL/PLATELET
Basophils Absolute: 0 10*3/uL (ref 0.0–0.1)
Eosinophils Relative: 4 % (ref 0–5)
HCT: 36.7 % — ABNORMAL LOW (ref 39.0–52.0)
Hemoglobin: 12.1 g/dL — ABNORMAL LOW (ref 13.0–17.0)
Lymphocytes Relative: 22 % (ref 12–46)
MCH: 24.4 pg — ABNORMAL LOW (ref 26.0–34.0)
Monocytes Relative: 7 % (ref 3–12)
Neutro Abs: 6 10*3/uL (ref 1.7–7.7)
Platelets: 267 10*3/uL (ref 150–400)
RDW: 15.8 % — ABNORMAL HIGH (ref 11.5–15.5)

## 2011-04-22 LAB — COMPREHENSIVE METABOLIC PANEL
AST: 19 U/L (ref 0–37)
Albumin: 4.3 g/dL (ref 3.5–5.2)
Alkaline Phosphatase: 58 U/L (ref 39–117)
Calcium: 9.3 mg/dL (ref 8.4–10.5)
Glucose, Bld: 92 mg/dL (ref 70–99)
Sodium: 140 mEq/L (ref 135–145)
Total Protein: 6.9 g/dL (ref 6.0–8.3)

## 2011-04-26 ENCOUNTER — Telehealth: Payer: Self-pay | Admitting: Gastroenterology

## 2011-04-26 ENCOUNTER — Encounter (HOSPITAL_COMMUNITY): Payer: Self-pay | Attending: Gastroenterology

## 2011-04-26 VITALS — BP 124/76 | HR 70 | Temp 97.8°F | Resp 16

## 2011-04-26 DIAGNOSIS — K5000 Crohn's disease of small intestine without complications: Secondary | ICD-10-CM | POA: Insufficient documentation

## 2011-04-26 MED ORDER — SODIUM CHLORIDE 0.9 % IJ SOLN: 10.0000 mL | INTRAMUSCULAR | Status: DC | PRN

## 2011-04-26 MED ORDER — SODIUM CHLORIDE 0.9 % IV SOLN
400.0000 mg | INTRAVENOUS | Status: DC
  Administered 2011-04-26: 400 mg via INTRAVENOUS
  Filled 2011-04-26: qty 40

## 2011-04-26 MED ORDER — SODIUM CHLORIDE 0.9 % IV SOLN: INTRAVENOUS | Status: DC

## 2011-04-26 NOTE — Telephone Encounter (Signed)
Pleas call pt. His hemoglobin is stable at 12. Normal for a male is 63. He likely has mildly active disease. RESTART REMICADE. RECHECK CBC AND FERRITIN IN 6 MOS. If his blood counts remain low, he may need a repeat EGD/TCS and/or capsule endoscopy. Eat a palm size serving of meat daily. OPV IN MAY 2013.

## 2011-04-26 NOTE — Telephone Encounter (Signed)
Results Cc to PCP  

## 2011-04-26 NOTE — Progress Notes (Signed)
IV catheter d/c per protocol at end of infusion. Catheter intact. Tolerated well.

## 2011-04-26 NOTE — Progress Notes (Signed)
22 gauge needle inserted into LAFA x 1 stick by Lupita Raider RN. Site cleaned with chlorhexadine. Site WNL. Tolerated well.

## 2011-04-27 ENCOUNTER — Other Ambulatory Visit: Payer: Self-pay

## 2011-04-27 DIAGNOSIS — K5090 Crohn's disease, unspecified, without complications: Secondary | ICD-10-CM

## 2011-04-27 NOTE — Telephone Encounter (Signed)
Reminder in epic to follow up in 6 months °

## 2011-04-27 NOTE — Telephone Encounter (Signed)
Tried to call pt. [employer] customer not available. Please call later. Lab orders on file for 10/27/2011.

## 2011-04-27 NOTE — Telephone Encounter (Signed)
Called again. Could not reach patient. [employer] customer cannot be reached. Mailing  Letter to call.

## 2011-05-03 NOTE — Telephone Encounter (Signed)
Called, [facility] for pt to return call for a  very important message.

## 2011-05-03 NOTE — Telephone Encounter (Signed)
Pt called and was informed. He started his Remicade last Mon and is back on schedule.

## 2011-05-21 ENCOUNTER — Other Ambulatory Visit: Payer: Self-pay | Admitting: Medical

## 2011-06-02 ENCOUNTER — Encounter (HOSPITAL_COMMUNITY): Payer: Self-pay | Admitting: Emergency Medicine

## 2011-06-02 ENCOUNTER — Emergency Department (HOSPITAL_COMMUNITY)
Admission: EM | Admit: 2011-06-02 | Discharge: 2011-06-02 | Disposition: A | Discharge status: Home / Self Care | Payer: Self-pay | Attending: Emergency Medicine | Admitting: Emergency Medicine

## 2011-06-02 DIAGNOSIS — I10 Essential (primary) hypertension: Secondary | ICD-10-CM | POA: Insufficient documentation

## 2011-06-02 DIAGNOSIS — K5090 Crohn's disease, unspecified, without complications: Secondary | ICD-10-CM | POA: Insufficient documentation

## 2011-06-02 DIAGNOSIS — R51 Headache: Secondary | ICD-10-CM | POA: Insufficient documentation

## 2011-06-02 DIAGNOSIS — F17200 Nicotine dependence, unspecified, uncomplicated: Secondary | ICD-10-CM | POA: Insufficient documentation

## 2011-06-02 DIAGNOSIS — Z79899 Other long term (current) drug therapy: Secondary | ICD-10-CM | POA: Insufficient documentation

## 2011-06-02 HISTORY — DX: Essential (primary) hypertension: I10

## 2011-06-02 MED ORDER — BENAZEPRIL HCL 40 MG PO TABS: 40.0000 mg | ORAL_TABLET | Freq: Every day | ORAL | Status: DC

## 2011-06-02 MED ORDER — BENAZEPRIL HCL 40 MG PO TABS
40.0000 mg | ORAL_TABLET | Freq: Every day | ORAL | Status: DC
  Administered 2011-06-02: 40 mg via ORAL
  Filled 2011-06-02 (×2): qty 1

## 2011-06-02 MED ORDER — ACETAMINOPHEN 325 MG PO TABS
650.0000 mg | ORAL_TABLET | Freq: Once | ORAL | Status: DC
  Filled 2011-06-02: qty 2

## 2011-06-02 MED ORDER — AMLODIPINE BESYLATE 5 MG PO TABS
5.0000 mg | ORAL_TABLET | Freq: Once | ORAL | Status: AC
  Administered 2011-06-02: 5 mg via ORAL
  Filled 2011-06-02: qty 1

## 2011-06-02 MED ORDER — BENAZEPRIL HCL 5 MG PO TABS
ORAL_TABLET | ORAL | Status: AC
  Filled 2011-06-02: qty 4

## 2011-06-02 NOTE — ED Notes (Signed)
Patient complaining of headache and high blood pressure x 3 days. States he does not have any more of his "blood pressure medications so I haven't had any in about a week."

## 2011-06-02 NOTE — ED Provider Notes (Signed)
History     CSN: [id number] Arrival date & time: 06/02/2011  2:57 AM   First MD Initiated Contact with Patient 06/02/11 0302      Chief Complaint  Patient presents with  . Headache  . Hypertension    (Consider location/radiation/quality/duration/timing/severity/associated sxs/prior treatment) HPI Patient presents emergent complaining of headache. Patient states for the last week he hasn't been able to take his blood pressure medications because he ran out. Patient states over the last 3 days he's been having trouble with headache. Patient states the headache is all over. It is not severe. Associated with vomiting, diarrhea or fever. He denies any numbness or weakness. Patient felt like his blood pressure psychiatric emergent get checked. Past Medical History  Diagnosis Date  . Helicobacter pylori gastritis 2009  . Spondyloarthropathy 2009 EROSIVE  . Sacroiliitis 2009  . Migraine headache     NONE SINCE AGE 5  . BMI between 19-24,adult JAN 2010 149 LBS  . Crohn's disease AUG 2009 NUR [facility]    HB 9.4 FERRITIN 26  140 LBS; no serologies-pt can't afford test  . Hypertension     History reviewed. No pertinent past surgical history.  Family History  Problem Relation Age of Onset  . Colon cancer Neg Hx   . Colon polyps Neg Hx   . Inflammatory bowel disease Neg Hx     History  Substance Use Topics  . Smoking status: Current Everyday Smoker -- 0.5 packs/day    Types: Cigarettes  . Smokeless tobacco: Not on file  . Alcohol Use: No      Review of Systems  All other systems reviewed and are negative.    Allergies  Azathioprine and Penicillins  Home Medications   Current Outpatient Rx  Name Route Sig Dispense Refill  . BENAZEPRIL HCL 40 MG PO TABS Oral Take 40 mg by mouth daily.      . INFLIXIMAB 100 MG IV SOLR Intravenous Inject into the vein.        BP 201/117  Pulse 71  Temp(Src) 97.7 F (36.5 C) (Oral)  Resp 16  Ht 5' 9"  (1.753 m)  Wt 165 lb (74.844 kg)   BMI 24.37 kg/m2  SpO2 100%  Physical Exam  Nursing note and vitals reviewed. Constitutional: He is oriented to person, place, and time. He appears well-developed and well-nourished. No distress.  HENT:  Head: Normocephalic and atraumatic.  Right Ear: External ear normal.  Left Ear: External ear normal.  Mouth/Throat: Oropharynx is clear and moist.  Eyes: Conjunctivae are normal. Right eye exhibits no discharge. Left eye exhibits no discharge. No scleral icterus.  Neck: Normal range of motion. Neck supple. No tracheal deviation present.  Cardiovascular: Normal rate, regular rhythm and intact distal pulses.   Pulmonary/Chest: Effort normal and breath sounds normal. No stridor. No respiratory distress. He has no wheezes. He has no rales.  Abdominal: Soft. Bowel sounds are normal. He exhibits no distension. There is no tenderness. There is no rebound and no guarding.  Musculoskeletal: He exhibits no edema and no tenderness.  Neurological: He is alert and oriented to person, place, and time. He has normal strength. No cranial nerve deficit ( no gross defecits noted) or sensory deficit. He exhibits normal muscle tone. He displays no seizure activity. Coordination normal.       No pronator drift bilateral upper extrem, able to hold both legs off bed for 5 seconds, sensation intact in all extremities, no visual field cuts, no left or right sided neglect  Skin: Skin is warm and dry. No rash noted.  Psychiatric: He has a normal mood and affect.    ED Course  Procedures (including critical care time)  Labs Reviewed - No data to display No results found.  Diagnosis: HTN  MDM  5:36 AM BP is now 149/105 on the monitor.  Pt is feeling better.  No headache.  No evidence of stroke/htn emergency.  Will dc home on his oral BP meds.       Kathalene Frames, MD 06/02/11 [phone]

## 2011-06-02 NOTE — ED Notes (Signed)
Pt reports improvement.  States that he no longer has a headache. Declines Tylenol.  BP improved.

## 2011-06-02 NOTE — ED Notes (Signed)
Patient with no complaints at this time. Respirations even and unlabored. Skin warm/dry. Discharge instructions reviewed with patient at this time. Patient given opportunity to voice concerns/ask questions. Patient discharged at this time and left Emergency Department with steady gait.   

## 2011-06-21 ENCOUNTER — Ambulatory Visit (HOSPITAL_COMMUNITY): Payer: Self-pay

## 2011-07-01 ENCOUNTER — Encounter (HOSPITAL_COMMUNITY): Payer: Self-pay | Attending: Gastroenterology

## 2011-07-01 DIAGNOSIS — K5000 Crohn's disease of small intestine without complications: Secondary | ICD-10-CM | POA: Insufficient documentation

## 2011-07-01 MED ORDER — SODIUM CHLORIDE 0.9 % IV SOLN
400.0000 mg | Freq: Once | INTRAVENOUS | Status: AC
  Administered 2011-07-01: 400 mg via INTRAVENOUS
  Filled 2011-07-01: qty 40

## 2011-07-01 MED ORDER — SODIUM CHLORIDE 0.9 % IV SOLN: 400.0000 mg | Freq: Once | INTRAVENOUS | Status: DC

## 2011-07-01 MED ORDER — SODIUM CHLORIDE 0.9 % IV SOLN
INTRAVENOUS | Status: DC
  Administered 2011-07-01: 12:00:00 via INTRAVENOUS

## 2011-07-01 NOTE — Progress Notes (Signed)
Infusion complete.  Patient tolerated well.  IV removed intact.

## 2011-08-26 ENCOUNTER — Encounter (HOSPITAL_COMMUNITY): Payer: Self-pay | Attending: Gastroenterology

## 2011-08-26 VITALS — BP 178/98 | HR 63

## 2011-08-26 DIAGNOSIS — K5000 Crohn's disease of small intestine without complications: Secondary | ICD-10-CM | POA: Insufficient documentation

## 2011-08-26 MED ORDER — SODIUM CHLORIDE 0.9 % IV SOLN
400.0000 mg | Freq: Once | INTRAVENOUS | Status: AC
  Administered 2011-08-26: 400 mg via INTRAVENOUS
  Filled 2011-08-26: qty 40

## 2011-08-26 MED ORDER — SODIUM CHLORIDE 0.9 % IV SOLN
INTRAVENOUS | Status: DC
  Administered 2011-08-26: 09:00:00 via INTRAVENOUS

## 2011-08-26 NOTE — Progress Notes (Signed)
Remicade given without any difficulty.

## 2011-09-06 ENCOUNTER — Emergency Department (HOSPITAL_COMMUNITY)
Admission: EM | Admit: 2011-09-06 | Discharge: 2011-09-06 | Disposition: A | Discharge status: Home / Self Care | Payer: Self-pay | Attending: Emergency Medicine | Admitting: Emergency Medicine

## 2011-09-06 ENCOUNTER — Encounter (HOSPITAL_COMMUNITY): Payer: Self-pay | Admitting: *Deleted

## 2011-09-06 DIAGNOSIS — R51 Headache: Secondary | ICD-10-CM | POA: Insufficient documentation

## 2011-09-06 DIAGNOSIS — F17200 Nicotine dependence, unspecified, uncomplicated: Secondary | ICD-10-CM | POA: Insufficient documentation

## 2011-09-06 DIAGNOSIS — R42 Dizziness and giddiness: Secondary | ICD-10-CM | POA: Insufficient documentation

## 2011-09-06 DIAGNOSIS — K5090 Crohn's disease, unspecified, without complications: Secondary | ICD-10-CM | POA: Insufficient documentation

## 2011-09-06 DIAGNOSIS — I10 Essential (primary) hypertension: Secondary | ICD-10-CM | POA: Insufficient documentation

## 2011-09-06 MED ORDER — TRAMADOL HCL 50 MG PO TABS
50.0000 mg | ORAL_TABLET | Freq: Once | ORAL | Status: AC
  Administered 2011-09-06: 50 mg via ORAL
  Filled 2011-09-06: qty 1

## 2011-09-06 MED ORDER — IBUPROFEN 400 MG PO TABS
600.0000 mg | ORAL_TABLET | Freq: Once | ORAL | Status: AC
  Administered 2011-09-06: 600 mg via ORAL
  Filled 2011-09-06: qty 2

## 2011-09-06 MED ORDER — BENAZEPRIL HCL 10 MG PO TABS: 40.0000 mg | ORAL_TABLET | Freq: Every day | ORAL | Status: DC

## 2011-09-06 MED ORDER — BENAZEPRIL HCL 40 MG PO TABS: 40.0000 mg | ORAL_TABLET | Freq: Every day | ORAL | Status: DC

## 2011-09-06 MED ORDER — CLONIDINE HCL 0.1 MG PO TABS
0.1000 mg | ORAL_TABLET | Freq: Once | ORAL | Status: AC
  Administered 2011-09-06: 0.1 mg via ORAL
  Filled 2011-09-06: qty 1

## 2011-09-06 NOTE — ED Notes (Signed)
Pt states he has not had blood pressure medication in two days, pt states no c/o blurred vision or dizziness.

## 2011-09-06 NOTE — Discharge Instructions (Signed)
RESOURCE GUIDE  Dental Problems  Patients with Medicaid: [facility] [phone] [address]                                           [phone] [address] Phone:  [phone]                                                  Phone:  [phone]  If unable to pay or uninsured, contact:  [facility] or [facility]. to become qualified for the adult dental clinic.  Chronic Pain Problems Contact [facility]  [phone] Patients need to be referred by their primary care doctor.  Insufficient Money for Medicine Contact [employer]:  call "211" or [facility] [phone].  No Primary Care Doctor Call [facility]  [phone] Other agencies that provide inexpensive medical care    [facility]  [phone]    [facility]  [phone]    [facility]  [phone]    [facility]  [phone]    [facility]  [phone]    [facility]  [phone]  Psychological Services [facility]  [phone] [facility] Services  [phone] [facility]   [phone] (emergency services [phone])  Substance Abuse Resources [facility]  [phone] [facility] [phone] [facility] [phone] Floydene Flock [phone] Residential & Outpatient Substance Abuse Program  [phone]  Abuse/Neglect [facility] Child Abuse Hotline [phone] [facility] Child Abuse Hotline 9307678090 (After Hours)  Emergency Shelter [facility] [phone]  M[facility] [phone] [facility] [phone]  MRSA Hotline #:   516-454-9537    [facility] Resources  [facility]     [employer]                          [facility] Dept. [address] [place]                       [address]      [address]  Blondell Reveal Phone:  [phone]                                   Phone:  [phone]                 Phone:  [phone]  [facility] Mental [facility] Phone:  [phone]  [facility] Child Abuse Hotline [phone] [phone] (After Hours)     Hypertension Hypertension is another name for high blood pressure. High blood pressure may mean that your heart needs to work harder to pump blood. Blood pressure consists of two numbers, which includes a higher number over a lower number (example: 110/72). HOME CARE   Make lifestyle changes as told by your doctor. This may include weight loss and exercise.   Take your blood pressure medicine every day.   Limit how much salt you use.   Stop smoking if you smoke.   Do not use drugs.   Talk to your doctor if you are using decongestants or birth control pills. These medicines might make blood pressure higher.   Females should not drink more than 1 alcoholic drink per day. Males should not drink more than 2 alcoholic drinks per day.   See your doctor as told.  GET HELP RIGHT AWAY IF:   You have a blood pressure reading with a top number of 180 or higher.   You get a very bad headache.   You get blurred or changing vision.   You feel confused.   You feel weak, numb, or faint.   You get chest or belly (abdominal) pain.   You throw up (vomit).   You cannot breathe very well.  MAKE SURE YOU:   Understand these instructions.   Will watch your condition.   Will get help right away if you are not doing well or get worse.  Document Released: 11/17/2007 Document Revised: 05/20/2011 Document Reviewed: 11/17/2007 [facility] Patient Information 2012 [place], [place].

## 2011-09-06 NOTE — ED Provider Notes (Signed)
History     CSN: [id number]  Arrival date & time 09/06/11  1303   First MD Initiated Contact with Patient 09/06/11 1421      Chief Complaint  Patient presents with  . Hypertension    (Consider location/radiation/quality/duration/timing/severity/associated sxs/prior treatment) HPI Pt states he has been out of his HTN meds for 2 days. Since that time he has had a R sided HA that he describes is similar to prev HA when his BP is elevated. No visual changes or weakness. No nausea, vomiting.  Past Medical History  Diagnosis Date  . Helicobacter pylori gastritis 2009  . Spondyloarthropathy 2009 EROSIVE  . Sacroiliitis 2009  . Migraine headache     NONE SINCE AGE 69  . BMI between 19-24,adult JAN 2010 149 LBS  . Crohn's disease AUG 2009 NUR [facility]    HB 9.4 FERRITIN 26  140 LBS; no serologies-pt can't afford test  . Hypertension     History reviewed. No pertinent past surgical history.  Family History  Problem Relation Age of Onset  . Colon cancer Neg Hx   . Colon polyps Neg Hx   . Inflammatory bowel disease Neg Hx     History  Substance Use Topics  . Smoking status: Current Everyday Smoker -- 0.5 packs/day    Types: Cigarettes  . Smokeless tobacco: Not on file  . Alcohol Use: No      Review of Systems  Constitutional: Negative for fever and chills.  HENT: Negative for neck pain.   Eyes: Negative for photophobia and visual disturbance.  Respiratory: Negative for chest tightness and shortness of breath.   Cardiovascular: Negative for chest pain, palpitations and leg swelling.  Gastrointestinal: Negative for nausea, vomiting and abdominal pain.  Skin: Negative for rash.  Neurological: Positive for light-headedness and headaches. Negative for weakness and numbness.    Allergies  Azathioprine and Penicillins  Home Medications   Current Outpatient Rx  Name Route Sig Dispense Refill  . BENAZEPRIL HCL 40 MG PO TABS Oral Take 1 tablet (40 mg total) by mouth daily. 30  tablet 2  . INFLIXIMAB 100 MG IV SOLR Intravenous Inject into the vein.      Marland Kitchen BENAZEPRIL HCL 40 MG PO TABS Oral Take 1 tablet (40 mg total) by mouth daily. 30 tablet 2    BP 174/111  Pulse 70  Temp(Src) 97.9 F (36.6 C) (Oral)  Resp 18  Ht 5' 9"  (1.753 m)  Wt 165 lb (74.844 kg)  BMI 24.37 kg/m2  SpO2 100%  Physical Exam  Nursing note and vitals reviewed. Constitutional: He is oriented to person, place, and time. He appears well-developed and well-nourished. No distress.  HENT:  Head: Normocephalic and atraumatic.  Mouth/Throat: Oropharynx is clear and moist.  Eyes: EOM are normal. Pupils are equal, round, and reactive to light.  Neck: Normal range of motion. Neck supple.  Cardiovascular: Normal rate and regular rhythm.   Pulmonary/Chest: Effort normal and breath sounds normal. No respiratory distress. He has no wheezes. He has no rales.  Abdominal: Soft. Bowel sounds are normal. There is no tenderness. There is no rebound and no guarding.  Musculoskeletal: Normal range of motion. He exhibits no edema and no tenderness.  Neurological: He is alert and oriented to person, place, and time.       5/5 motor, sensation intact, finger-to-nose intact.   Skin: Skin is warm and dry. No rash noted. No erythema.  Psychiatric: He has a normal mood and affect. His behavior is normal.  ED Course  Procedures (including critical care time)  Labs Reviewed - No data to display No results found.   1. Hypertension       MDM  Will treat HA and HTN and if improved refill Lotensin.   Repeat BP 160/90. Pt states his HA has improved. Will d/c home.   Julianne Rice, MD 09/06/11 [phone]

## 2011-09-06 NOTE — ED Notes (Signed)
Pt presents to er with c/o high blood pressure, pt states that he ran out of his blood pressure medication yesterday. Pt presents with being light headed, headache.

## 2011-10-11 ENCOUNTER — Other Ambulatory Visit: Payer: Self-pay

## 2011-10-11 DIAGNOSIS — K5090 Crohn's disease, unspecified, without complications: Secondary | ICD-10-CM

## 2011-10-15 ENCOUNTER — Encounter: Payer: Self-pay | Admitting: Gastroenterology

## 2011-10-21 ENCOUNTER — Ambulatory Visit (HOSPITAL_COMMUNITY): Payer: Self-pay

## 2011-10-29 ENCOUNTER — Encounter (HOSPITAL_COMMUNITY): Payer: Self-pay | Attending: Gastroenterology

## 2011-10-29 VITALS — BP 155/98 | HR 71 | Temp 98.0°F

## 2011-10-29 DIAGNOSIS — K5000 Crohn's disease of small intestine without complications: Secondary | ICD-10-CM | POA: Insufficient documentation

## 2011-10-29 MED ORDER — SODIUM CHLORIDE 0.9 % IJ SOLN
10.0000 mL | INTRAMUSCULAR | Status: DC | PRN
  Administered 2011-10-29: 10 mL via INTRAVENOUS

## 2011-10-29 MED ORDER — SODIUM CHLORIDE 0.9 % IV SOLN: 800.0000 mg | Freq: Once | INTRAVENOUS | Status: DC

## 2011-10-29 MED ORDER — SODIUM CHLORIDE 0.9 % IV SOLN
INTRAVENOUS | Status: DC
  Administered 2011-10-29: 10:00:00 via INTRAVENOUS

## 2011-10-29 MED ORDER — INFLIXIMAB 100 MG IV SOLR
400.0000 mg | Freq: Once | INTRAVENOUS | Status: AC
  Administered 2011-10-29: 400 mg via INTRAVENOUS
  Filled 2011-10-29: qty 40

## 2011-10-29 NOTE — Progress Notes (Signed)
Tolerated infusion well.

## 2011-11-04 LAB — CBC WITH DIFFERENTIAL/PLATELET
Basophils Relative: 1 % (ref 0–1)
Eosinophils Absolute: 0.2 10*3/uL (ref 0.0–0.7)
Eosinophils Relative: 2 % (ref 0–5)
Hemoglobin: 12 g/dL — ABNORMAL LOW (ref 13.0–17.0)
Lymphs Abs: 2.1 10*3/uL (ref 0.7–4.0)
MCH: 24.3 pg — ABNORMAL LOW (ref 26.0–34.0)
MCHC: 32.4 g/dL (ref 30.0–36.0)
MCV: 75.1 fL — ABNORMAL LOW (ref 78.0–100.0)
Monocytes Relative: 5 % (ref 3–12)
RBC: 4.93 MIL/uL (ref 4.22–5.81)

## 2011-11-14 ENCOUNTER — Telehealth: Payer: Self-pay | Admitting: Gastroenterology

## 2011-11-14 NOTE — Telephone Encounter (Signed)
CALLED PT TO DISCUSS LABS: HB LOW, FERRITIN NL. NEEDS OPV E 30 ASAP TO DISCUSS NEXT STEP, dX: MICROCYTIC ANEMIA.

## 2011-11-15 NOTE — Telephone Encounter (Signed)
Pt is aware of OV on 6/20 at 10 with SF in E30

## 2011-12-02 ENCOUNTER — Ambulatory Visit (INDEPENDENT_AMBULATORY_CARE_PROVIDER_SITE_OTHER): Payer: Self-pay | Admitting: Gastroenterology

## 2011-12-02 ENCOUNTER — Encounter: Payer: Self-pay | Admitting: Gastroenterology

## 2011-12-02 VITALS — BP 155/97 | HR 95 | Temp 98.4°F | Ht 69.0 in | Wt 161.0 lb

## 2011-12-02 DIAGNOSIS — K5000 Crohn's disease of small intestine without complications: Secondary | ICD-10-CM

## 2011-12-02 NOTE — Patient Instructions (Addendum)
CONTINUE REMICADE.  FOLLOW UP IN 6 MOS. WE WILL RECHECK YOUR LABS AT THAT TIME.

## 2011-12-02 NOTE — Progress Notes (Signed)
  Subjective:    Patient ID: Joshua Robertson, male    DOB: [birth year], 36 y.o.   MRN: [id number]  PCP: ???  HPI TAKING BP PILLS DAILY. BMS: DEPENDS ON WHAT HE EATS. CAN BE 1-2:SOLID/SOFT/LOOSE, NOT WATERY. ABD PAIN: 2-3X/MO-DEPENDS ON WHAT HE EATS IT MAY LASTS HOURS. NO FEVER OR CHILLS. A LITTLE PAIN IN HIS BACK BUT NOT MUCH. NO JOINT SWELLING. NO NAUSEA OR VOMITING, OR WEIGHT LOSS.  Past Medical History  Diagnosis Date  . Helicobacter pylori gastritis 2009  . Spondyloarthropathy 2009 EROSIVE  . Sacroiliitis 2009  . Migraine headache     NONE SINCE AGE 3  . BMI between 19-24,adult JAN 2010 149 LBS  . Crohn's disease AUG 2009 NUR [place]    HB 9.4 FERRITIN 26  140 LBS; no serologies-pt can't afford test  . Hypertension     No past surgical history on file.  Allergies  Allergen Reactions  . Azathioprine     REACTION: VOMITING  . Penicillins Swelling and Rash    Current Outpatient Prescriptions  Medication Sig Dispense Refill  . benazepril (LOTENSIN) 40 MG tablet Take 1 tablet (40 mg total) by mouth daily.    Marland Kitchen inFLIXimab (REMICADE) 100 MG injection Inject into the vein.        .          Review of Systems     Objective:   Physical Exam  Constitutional: He is oriented to person, place, and time. He appears well-developed. No distress.  HENT:  Head: Normocephalic and atraumatic.  Mouth/Throat: Oropharynx is clear and moist. No oropharyngeal exudate.  Eyes: Pupils are equal, round, and reactive to light. No scleral icterus.  Neck: Normal range of motion. Neck supple.  Cardiovascular: Normal rate, regular rhythm and normal heart sounds.   Pulmonary/Chest: Effort normal and breath sounds normal. No respiratory distress.  Abdominal: Soft. Bowel sounds are normal. He exhibits no distension. There is no tenderness.  Musculoskeletal: Normal range of motion. He exhibits no edema.  Neurological: He is alert and oriented to person, place, and time.       NO FOCAL DEFICITS     Psychiatric: He has a normal mood and affect.          Assessment & Plan:

## 2011-12-02 NOTE — Assessment & Plan Note (Addendum)
PT HAS MILDLY ACTIVE DISEASE-HB 12 BUT STABLE.  Stop smoking. Avoid NSAIDs. REMICADE Q 8 WKS-I PERSONALLY SPOKE TO [facility]. DEXA SCAN TO EVALUATE FOR OSTEOPENIA. WILL ADD CA IF NEEDED.   OPV in 6 mos-RECHECK HFP/CBC.

## 2011-12-02 NOTE — Progress Notes (Signed)
No PCP on file

## 2011-12-07 ENCOUNTER — Ambulatory Visit (HOSPITAL_COMMUNITY)
Admission: RE | Admit: 2011-12-07 | Discharge: 2011-12-07 | Disposition: A | Discharge status: Home / Self Care | Payer: Self-pay | Source: Ambulatory Visit | Attending: Gastroenterology | Admitting: Gastroenterology

## 2011-12-07 DIAGNOSIS — K5000 Crohn's disease of small intestine without complications: Secondary | ICD-10-CM | POA: Insufficient documentation

## 2011-12-07 DIAGNOSIS — F17200 Nicotine dependence, unspecified, uncomplicated: Secondary | ICD-10-CM | POA: Insufficient documentation

## 2011-12-08 ENCOUNTER — Encounter (HOSPITAL_COMMUNITY): Payer: Self-pay | Admitting: *Deleted

## 2011-12-08 ENCOUNTER — Emergency Department (HOSPITAL_COMMUNITY)
Admission: EM | Admit: 2011-12-08 | Discharge: 2011-12-08 | Disposition: A | Discharge status: Home / Self Care | Payer: Self-pay | Attending: Emergency Medicine | Admitting: Emergency Medicine

## 2011-12-08 DIAGNOSIS — Z8249 Family history of ischemic heart disease and other diseases of the circulatory system: Secondary | ICD-10-CM | POA: Insufficient documentation

## 2011-12-08 DIAGNOSIS — K589 Irritable bowel syndrome without diarrhea: Secondary | ICD-10-CM | POA: Insufficient documentation

## 2011-12-08 DIAGNOSIS — I10 Essential (primary) hypertension: Secondary | ICD-10-CM | POA: Insufficient documentation

## 2011-12-08 DIAGNOSIS — F17200 Nicotine dependence, unspecified, uncomplicated: Secondary | ICD-10-CM | POA: Insufficient documentation

## 2011-12-08 DIAGNOSIS — M479 Spondylosis, unspecified: Secondary | ICD-10-CM | POA: Insufficient documentation

## 2011-12-08 DIAGNOSIS — R51 Headache: Secondary | ICD-10-CM | POA: Insufficient documentation

## 2011-12-08 LAB — BASIC METABOLIC PANEL
BUN: 10 mg/dL (ref 6–23)
CO2: 26 mEq/L (ref 19–32)
Chloride: 102 mEq/L (ref 96–112)
Creatinine, Ser: 0.91 mg/dL (ref 0.50–1.35)
Glucose, Bld: 102 mg/dL — ABNORMAL HIGH (ref 70–99)

## 2011-12-08 MED ORDER — BENAZEPRIL HCL 10 MG PO TABS: 40.0000 mg | ORAL_TABLET | Freq: Every day | ORAL | Status: DC

## 2011-12-08 MED ORDER — BENAZEPRIL HCL 10 MG PO TABS
40.0000 mg | ORAL_TABLET | Freq: Every day | ORAL | Status: DC
  Administered 2011-12-08: 30 mg via ORAL
  Filled 2011-12-08 (×3): qty 4

## 2011-12-08 MED ORDER — BENAZEPRIL HCL 5 MG PO TABS
ORAL_TABLET | ORAL | Status: AC
  Filled 2011-12-08: qty 6

## 2011-12-08 NOTE — ED Notes (Signed)
Pt has not had his bp meds for 2 days, has headache,

## 2011-12-08 NOTE — Discharge Instructions (Signed)
RESOURCE GUIDE  Chronic Pain Problems: Contact [facility]  [phone] Patients need to be referred by their primary care doctor.  Insufficient Money for Medicine: Contact United Way:  call "211" or [place] [phone].  No Primary Care Doctor: - Call [facility]  [phone] - can help you locate a primary care doctor that  accepts your insurance, provides certain services, etc. - Physician Referral Service- [phone]  Agencies that provide inexpensive medical care: - [facility]  [facility]  [phone] - [facility]  [phone] - [facility]  [phone] - Planned Parenthood  [phone] - [facility]  [phone]  [place] Providers: - [facility]- [address], [address]  [phone], Mon-Fri 9am-7pm, Sat 9am-1pm - [place], [address] [place]  [place], [address] [place]  [place]- [address]  [place], [address], [phone]  Only accepts [place] patients after they have their name  applied to their card  Self Pay (no insurance) in [place]: - Sickle Cell Patients: Dr Kevan Ny, [facility]  [place], [facility]- [place]  [facility] [place]- [address], [facility]- see information above (Speak to [employer] if you do not have insurance)       -  [facility]- [place], [place],  [place], [place]  Dr Vista Lawman-  [address], [place], [facility]- [address], [phone]       -  [facility] [place]- [address], [place], also [address] [phone]       -    [facility]- [address], [place], 1st & 3rd Saturday   every month, 10am-1pm  1) Find a Doctor and Pay Out of Pocket Although you won't have to find out who is covered by your insurance plan, it is a good idea to ask around and get recommendations. You will then need to call the office and see if the doctor you have chosen will accept you as a new patient and what types of options they offer for patients who are self-pay. Some doctors offer discounts or will set up payment plans for their patients who do not have insurance, but you will need to ask so you aren't surprised when you get to your appointment.  2) Contact Your Local Health Department Not all health departments have doctors that can see patients for sick visits, but many do, so it is worth a call to see if yours does. If you don't know where your local health department is, you can check in your phone book. The CDC also has a tool to help you locate your state's health department, and many state websites also have  listings of all of their local health departments.  3) Find a [facility] If your illness is not likely to be very severe or complicated, you may want to try a walk in clinic. These are popping up all over the country in pharmacies, drugstores, and shopping centers. They're usually staffed by nurse practitioners or physician assistants that have been trained to treat common illnesses and complaints. They're usually fairly quick and inexpensive. However, if you have serious medical issues or chronic medical problems, these are probably not your best option  STD Testing - [place], [facility], [address], [place], phone [phone] or [phone].  Monday - Friday, call for an appointment. - [place], [facility], [address], [place], phone [phone] or [phone].  Monday - Friday, call for an appointment.  Abuse/Neglect: - [place] [phone] - [place] [phone] (After Hours)  Emergency Shelter:  [facility] [phone]  Maternity Homes: - Room at the [place] [phone] - [place] [phone]  MRSA Hotline #:   (925) 492-3176  [facility] [place] Dept. [address]         [place] Phone:  [phone]                                  Phone:  [phone]                   Phone:  [phone]  [place], [place] in [place], [address]                                  [place] [phone] or [phone] (After Hours)   [place]  Substance Abuse Resources: - [facility] and Drug Services  [phone] - [place] [phone] - [facility] [place] [phone] - Residential & Outpatient Substance Abuse Program  [phone]  Psychological Services: - [place]  [place], [phone] [place]. [address], [place], [place]: [phone] or [phone], [employer]  Dental Assistance  If unable to pay or uninsured, contact:  [facility] or [facility]. to become qualified for the adult dental  clinic.  Patients with Medicaid: [facility] [phone] [address], [place] [address] [place]  If unable  to pay, or uninsured, contact [facility] [phone]) or [place] [phone] in [place], [place] in [facility]) to become qualified for the adult dental clinic  Other [place]- [place], [place], [place], [address], [place], [place], 2nd and 4th Thursday of the month at 6:30am.  10 clients each day by appointment, can sometimes see walk-in patients if someone does not show for an appointment. [facility]- [address] [place], [place], [address], [place], [place], [place], [address], [place] Department- [place] [phone]   Use resource guide to find a primary care doctor also may well check at the free clinic in the [place]. Take hypertensive meds as directed will be important to find a primary care Dr. to help you in nature blood pressure return for any new worse symptoms.

## 2011-12-08 NOTE — ED Provider Notes (Signed)
History     CSN: [id number]  Arrival date & time 12/08/11  1653   First MD Initiated Contact with Patient 12/08/11 1658      Chief Complaint  Patient presents with  . Hypertension    (Consider location/radiation/quality/duration/timing/severity/associated sxs/prior treatment) The history is provided by the patient.   patient is a 36 year old male with long-standing history of hypertension without hypertensive meds for the past 3 days. Associated with mild headache not severe. Patient denies chest pain any focal neurological findings nausea vomiting fever shortness of breath.  Headache pain is about a 2/10 in the frontal areas not severe. Patient does have a history of Crohn's disease as well. Does have a history of migraine headaches but this is not a migraine.  Past Medical History  Diagnosis Date  . Helicobacter pylori gastritis 2009  . Spondyloarthropathy 2009 EROSIVE  . Sacroiliitis 2009  . Migraine headache     NONE SINCE AGE 68  . BMI between 19-24,adult JAN 2010 149 LBS  . Crohn's disease AUG 2009 NUR [facility]    HB 9.4 FERRITIN 26  140 LBS; no serologies-pt can't afford test  . Hypertension     History reviewed. No pertinent past surgical history.  Family History  Problem Relation Age of Onset  . Colon cancer Neg Hx   . Colon polyps Neg Hx   . Inflammatory bowel disease Neg Hx   . Hypertension Mother   . Hypertension Father     History  Substance Use Topics  . Smoking status: Current Everyday Smoker -- 0.5 packs/day    Types: Cigarettes  . Smokeless tobacco: Not on file  . Alcohol Use: No      Review of Systems  Constitutional: Negative for fever and chills.  HENT: Negative for congestion and neck pain.   Eyes: Negative for visual disturbance.  Respiratory: Negative for shortness of breath.   Cardiovascular: Negative for chest pain.  Gastrointestinal: Negative for nausea, vomiting, abdominal pain and diarrhea.  Genitourinary: Negative for dysuria.    Musculoskeletal: Negative for back pain.  Skin: Negative for rash.  Neurological: Positive for headaches. Negative for seizures, speech difficulty, weakness and numbness.  Hematological: Does not bruise/bleed easily.    Allergies  Azathioprine and Penicillins  Home Medications   Current Outpatient Rx  Name Route Sig Dispense Refill  . BENAZEPRIL HCL 40 MG PO TABS Oral Take 1 tablet (40 mg total) by mouth daily. 30 tablet 2  . INFLIXIMAB 100 MG IV SOLR Intravenous Inject into the vein every 8 (eight) weeks.       BP 164/101  Pulse 79  Temp 98.3 F (36.8 C) (Oral)  Resp 20  Ht 5' 10"  (1.778 m)  Wt 160 lb (72.576 kg)  BMI 22.96 kg/m2  SpO2 100%  Physical Exam  Nursing note and vitals reviewed. Constitutional: He is oriented to person, place, and time. He appears well-developed and well-nourished. No distress.  HENT:  Head: Normocephalic and atraumatic.  Mouth/Throat: Oropharynx is clear and moist.  Eyes: Conjunctivae and EOM are normal. Pupils are equal, round, and reactive to light.  Neck: Normal range of motion. Neck supple.  Cardiovascular: Normal rate, regular rhythm and normal heart sounds.   No murmur heard. Pulmonary/Chest: Effort normal and breath sounds normal. No respiratory distress. He has no wheezes. He has no rales.  Abdominal: Soft. Bowel sounds are normal. There is no tenderness.  Musculoskeletal: Normal range of motion. He exhibits no edema and no tenderness.  Neurological: He is alert and  oriented to person, place, and time. No cranial nerve deficit. He exhibits normal muscle tone. Coordination normal.  Skin: Skin is warm. No rash noted. He is not diaphoretic.    ED Course  Procedures (including critical care time)   Labs Reviewed  BASIC METABOLIC PANEL   Dg Bone Density  12/07/2011  The Bone Mineral Densitometry hard-copy report (which includes all data, graphical display, and FRAX results when applicable) has been sent directly to the ordering  physician.  This report can also be obtained electronically by viewing images for this exam through the performing facility's EMR, or by logging directly into [employer].  Original Report Authenticated By: Burnetta Sabin, M.D.   Results for orders placed during the hospital encounter of 93/73/42  BASIC METABOLIC PANEL      Component Value Range   Sodium 137  135 - 145 mEq/L   Potassium 3.6  3.5 - 5.1 mEq/L   Chloride 102  96 - 112 mEq/L   CO2 26  19 - 32 mEq/L   Glucose, Bld 102 (*) 70 - 99 mg/dL   BUN 10  6 - 23 mg/dL   Creatinine, Ser 0.91  0.50 - 1.35 mg/dL   Calcium 9.1  8.4 - 10.5 mg/dL   GFR calc non Af Amer >90  >90 mL/min   GFR calc Af Amer >90  >90 mL/min     1. Hypertension       MDM  Patient with history of hypertension off blood pressure meds for the past 2 days associated with a mild headache no evidence of hypertensive emergency no shortness of breath no chest pain or stroke symptoms. Headache is not severe. Blood pressure here was 164/101. Patient has not had electrolytes checked for a while so do the screening and given first dose of his Lotensin here 40 mg and will discharge home with prescription if lateralized normal. Given resource guide to help him find a primary care Dr.    Mervin Kung, MD 12/08/11 [phone]

## 2011-12-09 NOTE — Progress Notes (Signed)
Reminder in epic to follow up with [facility] in 6 months and recheck HFP/CBC

## 2011-12-24 ENCOUNTER — Encounter (HOSPITAL_COMMUNITY): Payer: Self-pay | Attending: Gastroenterology

## 2011-12-24 VITALS — BP 156/106 | HR 51 | Temp 97.6°F

## 2011-12-24 DIAGNOSIS — K5000 Crohn's disease of small intestine without complications: Secondary | ICD-10-CM | POA: Insufficient documentation

## 2011-12-24 MED ORDER — SODIUM CHLORIDE 0.9 % IV SOLN
INTRAVENOUS | Status: DC
  Administered 2011-12-24: 10:00:00 via INTRAVENOUS

## 2011-12-24 MED ORDER — SODIUM CHLORIDE 0.9 % IV SOLN
400.0000 mg | Freq: Once | INTRAVENOUS | Status: AC
  Administered 2011-12-24: 400 mg via INTRAVENOUS
  Filled 2011-12-24: qty 40

## 2011-12-24 MED ORDER — SODIUM CHLORIDE 0.9 % IJ SOLN
10.0000 mL | INTRAMUSCULAR | Status: DC | PRN
  Administered 2011-12-24: 10 mL via INTRAVENOUS

## 2011-12-24 NOTE — Progress Notes (Signed)
Tolerated infusion well.

## 2012-02-18 ENCOUNTER — Ambulatory Visit (HOSPITAL_COMMUNITY): Payer: Self-pay

## 2012-02-28 ENCOUNTER — Encounter (HOSPITAL_COMMUNITY): Payer: Self-pay | Attending: Gastroenterology

## 2012-02-28 VITALS — BP 150/100 | HR 64 | Temp 98.4°F | Resp 18

## 2012-02-28 DIAGNOSIS — K5000 Crohn's disease of small intestine without complications: Secondary | ICD-10-CM | POA: Insufficient documentation

## 2012-02-28 MED ORDER — SODIUM CHLORIDE 0.9 % IJ SOLN
10.0000 mL | INTRAMUSCULAR | Status: DC | PRN
  Administered 2012-02-28: 10 mL via INTRAVENOUS

## 2012-02-28 MED ORDER — SODIUM CHLORIDE 0.9 % IV SOLN
400.0000 mg | Freq: Once | INTRAVENOUS | Status: AC
  Administered 2012-02-28: 400 mg via INTRAVENOUS
  Filled 2012-02-28: qty 40

## 2012-02-28 MED ORDER — SODIUM CHLORIDE 0.9 % IV SOLN
INTRAVENOUS | Status: DC
  Administered 2012-02-28: 10:00:00 via INTRAVENOUS

## 2012-02-28 NOTE — Progress Notes (Signed)
Tolerated remicade infusion well.

## 2012-04-10 ENCOUNTER — Emergency Department (HOSPITAL_COMMUNITY)
Admission: EM | Admit: 2012-04-10 | Discharge: 2012-04-10 | Disposition: A | Discharge status: Home / Self Care | Payer: Self-pay | Attending: Emergency Medicine | Admitting: Emergency Medicine

## 2012-04-10 ENCOUNTER — Encounter (HOSPITAL_COMMUNITY): Payer: Self-pay

## 2012-04-10 DIAGNOSIS — Z79899 Other long term (current) drug therapy: Secondary | ICD-10-CM | POA: Insufficient documentation

## 2012-04-10 DIAGNOSIS — Z8719 Personal history of other diseases of the digestive system: Secondary | ICD-10-CM | POA: Insufficient documentation

## 2012-04-10 DIAGNOSIS — F17200 Nicotine dependence, unspecified, uncomplicated: Secondary | ICD-10-CM | POA: Insufficient documentation

## 2012-04-10 DIAGNOSIS — K5090 Crohn's disease, unspecified, without complications: Secondary | ICD-10-CM | POA: Insufficient documentation

## 2012-04-10 DIAGNOSIS — I10 Essential (primary) hypertension: Secondary | ICD-10-CM | POA: Insufficient documentation

## 2012-04-10 DIAGNOSIS — Z760 Encounter for issue of repeat prescription: Secondary | ICD-10-CM | POA: Insufficient documentation

## 2012-04-10 DIAGNOSIS — Z8739 Personal history of other diseases of the musculoskeletal system and connective tissue: Secondary | ICD-10-CM | POA: Insufficient documentation

## 2012-04-10 DIAGNOSIS — Z8669 Personal history of other diseases of the nervous system and sense organs: Secondary | ICD-10-CM | POA: Insufficient documentation

## 2012-04-10 DIAGNOSIS — IMO0002 Reserved for concepts with insufficient information to code with codable children: Secondary | ICD-10-CM | POA: Insufficient documentation

## 2012-04-10 DIAGNOSIS — R51 Headache: Secondary | ICD-10-CM | POA: Insufficient documentation

## 2012-04-10 MED ORDER — BENAZEPRIL HCL 10 MG PO TABS
40.0000 mg | ORAL_TABLET | Freq: Every day | ORAL | Status: DC
  Administered 2012-04-10: 40 mg via ORAL
  Filled 2012-04-10 (×3): qty 4

## 2012-04-10 MED ORDER — DIPHENHYDRAMINE HCL 50 MG/ML IJ SOLN
25.0000 mg | Freq: Once | INTRAMUSCULAR | Status: AC
  Administered 2012-04-10: 25 mg via INTRAVENOUS
  Filled 2012-04-10: qty 1

## 2012-04-10 MED ORDER — METOCLOPRAMIDE HCL 10 MG PO TABS: 10.0000 mg | ORAL_TABLET | Freq: Four times a day (QID) | ORAL | Status: DC | PRN

## 2012-04-10 MED ORDER — BENAZEPRIL HCL 5 MG PO TABS: 40.0000 mg | ORAL_TABLET | Freq: Every day | ORAL | Status: DC

## 2012-04-10 MED ORDER — SODIUM CHLORIDE 0.9 % IV SOLN
Freq: Once | INTRAVENOUS | Status: AC
  Administered 2012-04-10: 19:00:00 via INTRAVENOUS

## 2012-04-10 MED ORDER — METOCLOPRAMIDE HCL 5 MG/ML IJ SOLN
10.0000 mg | Freq: Once | INTRAMUSCULAR | Status: AC
  Administered 2012-04-10: 10 mg via INTRAVENOUS
  Filled 2012-04-10: qty 2

## 2012-04-10 NOTE — ED Notes (Signed)
Pt diaphoretic, rechecked vitals, BP still elevated (see VS) temp 98.1, denies pain just feels hot. Adjusted room temperature, will reassess in 10 minutes.

## 2012-04-10 NOTE — ED Notes (Signed)
Called ac to request meds from pharmacy. Will bring asap

## 2012-04-10 NOTE — ED Provider Notes (Signed)
History     CSN: [id number]  Arrival date & time 04/10/12  1718   First MD Initiated Contact with Patient 04/10/12 1816      Chief Complaint  Patient presents with  . Hypertension  . Medication Refill    (Consider location/radiation/quality/duration/timing/severity/associated sxs/prior treatment) Patient is a 36 y.o. male presenting with hypertension. The history is provided by the patient.  Hypertension  He has a history of hypertension and ran out of his benazepril 40 mg tablets 3 days ago. Today, he developed a bifrontal headache which is dull and throbbing in nature and moderately severe. He rates it at 8/10. Headache is worse with exposure to light and there is associated nausea. He denies visual disturbance and denies vomiting. Nothing makes the headache better other than that and trying to stay still in a dark and quiet room. He checks his blood pressure was 170/120 so he came into emergency. Last time he checked his blood pressure was about 2 weeks ago and he does not remember what it was at that time.  Past Medical History  Diagnosis Date  . Helicobacter pylori gastritis 2009  . Spondyloarthropathy 2009 EROSIVE  . Sacroiliitis 2009  . Migraine headache     NONE SINCE AGE 12  . BMI between 19-24,adult JAN 2010 149 LBS  . Crohn's disease AUG 2009 NUR [facility]    HB 9.4 FERRITIN 26  140 LBS; no serologies-pt can't afford test  . Hypertension     History reviewed. No pertinent past surgical history.  Family History  Problem Relation Age of Onset  . Colon cancer Neg Hx   . Colon polyps Neg Hx   . Inflammatory bowel disease Neg Hx   . Hypertension Mother   . Hypertension Father     History  Substance Use Topics  . Smoking status: Current Every Day Smoker -- 0.5 packs/day    Types: Cigarettes  . Smokeless tobacco: Not on file  . Alcohol Use: No      Review of Systems  All other systems reviewed and are negative.    Allergies  Azathioprine and  Penicillins  Home Medications   Current Outpatient Rx  Name Route Sig Dispense Refill  . BENAZEPRIL HCL 40 MG PO TABS Oral Take 1 tablet (40 mg total) by mouth daily. 30 tablet 2  . INFLIXIMAB 100 MG IV SOLR Intravenous Inject into the vein every 8 (eight) weeks.       BP 196/124  Pulse 65  Temp 98.2 F (36.8 C) (Oral)  Resp 18  Ht 5' 9"  (1.753 m)  Wt 165 lb (74.844 kg)  BMI 24.37 kg/m2  SpO2 100%  Physical Exam  Nursing note and vitals reviewed. 36 year old male, resting comfortably and in no acute distress. Vital signs are  Significant for hypertension with blood pressure 196/124. Oxygen saturation is 100%, which is normal. Head is normocephalic and atraumatic. PERRLA, EOMI. fundi show no hemorrhage, exudate, or papilledema. Oropharynx is clear. There is tenderness palpation over the temporalis muscles bilaterally. Neck is nontender and supple without adenopathy or JVD. Back is nontender and there is no CVA tenderness. Lungs are clear without rales, wheezes, or rhonchi. Chest is nontender. Heart has regular rate and rhythm without murmur. Abdomen is soft, flat, nontender without masses or hepatosplenomegaly and peristalsis is normoactive. Extremities have no cyanosis or edema, full range of motion is present. Skin is warm and dry without rash. Neurologic: Mental status is normal, cranial nerves are intact, there are no motor  or sensory deficits.   ED Course  Procedures (including critical care time)   1. Headache   2. Hypertension       MDM  Hypertension which is partly due to medication noncompliance and may be partly due 2 pain. Headache seems to be migraine or muscle contraction headache. He will be given a dose of his Benazepril and be given a migraine cocktail and reassessed.  Headache is much better after IV metoclopramide and diphenhydramine. Blood pressure has come down to 141/95. He is sent home with a prescription for benazepril and  metoclopramide.        Delora Fuel, MD 37/48/27 0786

## 2012-04-10 NOTE — ED Notes (Signed)
Dr.glick to see pt.

## 2012-04-10 NOTE — ED Notes (Signed)
Pt stated feels much better, cool to the touch, not diaphoretic at this time. Discharge instructions reviewed with pt, questions answered. Pt verbalized understanding.

## 2012-04-10 NOTE — ED Notes (Signed)
Pt reports that he ran out of his bp meds 3 days, (enazapril)  bp now high and has headache

## 2012-04-10 NOTE — ED Notes (Signed)
Pt reports being out of his bp meds x 3 days, today checked bp at home and was elevated and has headache.

## 2012-04-24 ENCOUNTER — Encounter (HOSPITAL_COMMUNITY): Payer: MEDICAID | Attending: Gastroenterology

## 2012-04-24 VITALS — BP 175/110 | HR 58 | Temp 98.3°F | Resp 18

## 2012-04-24 DIAGNOSIS — K5000 Crohn's disease of small intestine without complications: Secondary | ICD-10-CM | POA: Insufficient documentation

## 2012-04-24 MED ORDER — SODIUM CHLORIDE 0.9 % IV SOLN
INTRAVENOUS | Status: DC
  Administered 2012-04-24: 10:00:00 via INTRAVENOUS

## 2012-04-24 MED ORDER — SODIUM CHLORIDE 0.9 % IJ SOLN
10.0000 mL | INTRAMUSCULAR | Status: DC | PRN
  Administered 2012-04-24: 10 mL via INTRAVENOUS

## 2012-04-24 MED ORDER — SODIUM CHLORIDE 0.9 % IV SOLN
400.0000 mg | Freq: Once | INTRAVENOUS | Status: AC
  Administered 2012-04-24: 400 mg via INTRAVENOUS
  Filled 2012-04-24: qty 40

## 2012-04-24 NOTE — Progress Notes (Signed)
Tolerated infusion well.

## 2012-05-05 ENCOUNTER — Telehealth: Payer: Self-pay

## 2012-05-05 ENCOUNTER — Encounter: Payer: Self-pay | Admitting: *Deleted

## 2012-05-05 ENCOUNTER — Other Ambulatory Visit: Payer: Self-pay

## 2012-05-05 DIAGNOSIS — K5090 Crohn's disease, unspecified, without complications: Secondary | ICD-10-CM

## 2012-05-05 NOTE — Telephone Encounter (Signed)
From: [place]   Sent: 05/05/2012 9:12 AM   To: Marylou Mccoy, LPN      OPV IN 6 MONTHS WITH [facility] AND RECHECK HFP/CBC            Forwarded by:     Marylou Mccoy, LPN Date: 58/68/2574      It said recheck HFP and CBC. Putting on file for 10/2012.

## 2012-06-19 ENCOUNTER — Encounter (HOSPITAL_COMMUNITY): Payer: MEDICAID | Attending: Gastroenterology

## 2012-06-19 ENCOUNTER — Telehealth: Payer: Self-pay | Admitting: Gastroenterology

## 2012-06-19 ENCOUNTER — Other Ambulatory Visit: Payer: Self-pay

## 2012-06-19 VITALS — BP 152/97 | HR 76 | Temp 97.8°F | Resp 16

## 2012-06-19 DIAGNOSIS — K5000 Crohn's disease of small intestine without complications: Secondary | ICD-10-CM | POA: Insufficient documentation

## 2012-06-19 DIAGNOSIS — K5090 Crohn's disease, unspecified, without complications: Secondary | ICD-10-CM

## 2012-06-19 MED ORDER — SODIUM CHLORIDE 0.9 % IV SOLN
INTRAVENOUS | Status: DC
  Administered 2012-06-19: 10:00:00 via INTRAVENOUS

## 2012-06-19 MED ORDER — SODIUM CHLORIDE 0.9 % IV SOLN
400.0000 mg | Freq: Once | INTRAVENOUS | Status: AC
  Administered 2012-06-19: 400 mg via INTRAVENOUS
  Filled 2012-06-19: qty 40

## 2012-06-19 MED ORDER — SODIUM CHLORIDE 0.9 % IJ SOLN
10.0000 mL | INTRAMUSCULAR | Status: DC | PRN
  Administered 2012-06-19: 10 mL via INTRAVENOUS

## 2012-06-19 NOTE — Telephone Encounter (Signed)
Needs OPV IN JAN OR FEB 2014 Dx: Crohn's w/ SLF

## 2012-06-19 NOTE — Telephone Encounter (Signed)
Joshua Emperor, NP wrote the order and [facility]. Pt had not had his LFT's and CBC done in Nov/Dec. I faxed those orders to them also. ( Remicade order was done for 1 year).

## 2012-06-19 NOTE — Progress Notes (Signed)
Tolerated remicade infusion well.  IV D/C.

## 2012-06-19 NOTE — Telephone Encounter (Signed)
Routing to [place] to schedule.

## 2012-06-19 NOTE — Telephone Encounter (Signed)
Called and spoke to [place]. Pt is there now and his orders have expired. They are faxing me a copy of the previous order for Laban Emperor, NP to use to send a current order in Dr. Oneida Alar absence today.

## 2012-06-19 NOTE — Telephone Encounter (Signed)
Dr Jaclyn Prime office called. Patient is at his office now. Nurse said that his remicaid order expired back in November and they need another one faxed to them along with any labs that are due. Fax [phone]

## 2012-06-20 ENCOUNTER — Encounter: Payer: Self-pay | Admitting: Gastroenterology

## 2012-06-20 NOTE — Telephone Encounter (Signed)
Pt is aware of OV on 1/29 at 0900 with [facility] and appt card was mailed

## 2012-07-12 ENCOUNTER — Ambulatory Visit: Payer: Self-pay | Admitting: Gastroenterology

## 2012-08-09 ENCOUNTER — Encounter: Payer: Self-pay | Admitting: Gastroenterology

## 2012-08-09 ENCOUNTER — Ambulatory Visit (INDEPENDENT_AMBULATORY_CARE_PROVIDER_SITE_OTHER): Payer: Self-pay | Admitting: Gastroenterology

## 2012-08-09 VITALS — BP 160/110 | HR 75 | Temp 98.3°F | Ht 69.0 in | Wt 158.8 lb

## 2012-08-09 DIAGNOSIS — I10 Essential (primary) hypertension: Secondary | ICD-10-CM

## 2012-08-09 DIAGNOSIS — K5000 Crohn's disease of small intestine without complications: Secondary | ICD-10-CM

## 2012-08-09 MED ORDER — CLONIDINE HCL 0.1 MG PO TABS: 0.1000 mg | ORAL_TABLET | Freq: Two times a day (BID) | ORAL | Status: AC

## 2012-08-09 NOTE — Assessment & Plan Note (Addendum)
SBP > 105. NO SX.  ADD CLONIDINE 0.1 MG BID. FIRST DOES TODAY AT 430P AND THEN NEXT DOSE AT 1030 PM THEN BID. RECHECK BP IN 3 DAYS WITH HEALTH DEPARTMENT. PT INSTRUCTED TO GO TO THE ED FOR HAs, CHANGE IN VISION, CHEST PAIN, SOB, OR DIZZINESS.

## 2012-08-09 NOTE — Patient Instructions (Addendum)
YOUR BP IS OUT OF CONTROL. START CLONIDINE 0.1 MG BID AND HAVE YOUR BP RECHECKED AT [place].  CONTINUE REMICADE.  CALL IN MAY 2014 TO RENEW YOUR ASSISTANCE.  GET YOU LABS DRAWN IN THE NEXT 30 DAYS.  FOLLOW UP IN 6 MOS.

## 2012-08-09 NOTE — Assessment & Plan Note (Signed)
DISEASE CONTROLLED. MILD LBP MOST LIKELY MUSCULOSKELETAL. [facility] LABS JUN 2013: HB 12.3  CONTINUE REMICADE. PT WILL CALL IN MAY TO RENEW PAPERWORK. AVOID SMOKING. CBC/HFP WITHIN THE NEXT 30 DAYS. OPV IN 6 MOS.

## 2012-08-09 NOTE — Progress Notes (Signed)
No PCP on file

## 2012-08-09 NOTE — Progress Notes (Signed)
  Subjective:    Patient ID: Joshua Robertson, male    DOB: Sep 29, 1975, 37 y.o.   MRN: [id number]  PCP: NONE  HPI STILL SMOKING: < 1 PK/DAY. HAVING SOME STIFFNESS IN LOWER BACK. HAVING DIFFICULTY GETTING A JOB. IF EATS WRONG, 2-3 BMS: #3-4. NO RASH ON LEGS OR SORES ON YOUR MOUTH. NO PELVIC PAIN.  PT DENIES FEVER, CHILLS, BRBPR, nausea, vomiting, melena, diarrhea, constipation, abd pain, problems swallowing, heartburn or indigestion.  Past Medical History  Diagnosis Date  . Helicobacter pylori gastritis 2009  . Spondyloarthropathy 2009 EROSIVE  . Sacroiliitis 2009  . Migraine headache     NONE SINCE AGE 4  . BMI between 19-24,adult JAN 2010 149 LBS  . Crohn's disease AUG 2009 NUR [place]    HB 9.4 FERRITIN 26  140 LBS; no serologies-pt can't afford test  . Hypertension    No past surgical history on file.  Allergies  Allergen Reactions  . Azathioprine     REACTION: VOMITING  . Penicillins Swelling and Rash   Current Outpatient Prescriptions  Medication Sig Dispense Refill  . benazepril (LOTENSIN) 40 MG tablet Take 1 tablet (40 mg total) by mouth daily.    .      . inFLIXimab (REMICADE) 100 MG injection Inject into the vein every 8 (eight) weeks.  LAST DOSE JAN 2014   .            Review of Systems     Objective:   Physical Exam  Vitals reviewed. Constitutional: He is oriented to person, place, and time. He appears well-nourished.  HENT:  Head: Normocephalic and atraumatic.  Mouth/Throat: No oropharyngeal exudate.  Eyes: Pupils are equal, round, and reactive to light. No scleral icterus.  Neck: Normal range of motion. Neck supple.  Cardiovascular: Normal rate, regular rhythm and normal heart sounds.   Pulmonary/Chest: Effort normal and breath sounds normal. No respiratory distress.  Abdominal: Soft. Bowel sounds are normal. He exhibits no distension. There is no tenderness.  Musculoskeletal: He exhibits no edema.  Lymphadenopathy:    He has no cervical adenopathy.   Neurological: He is alert and oriented to person, place, and time.  NO FOCAL DEFICITS   Psychiatric: He has a normal mood and affect.          Assessment & Plan:

## 2012-08-10 LAB — CBC WITH DIFFERENTIAL/PLATELET
Basophils Absolute: 0 10*3/uL (ref 0.0–0.1)
Eosinophils Absolute: 0.2 10*3/uL (ref 0.0–0.7)
Eosinophils Relative: 2 % (ref 0–5)
HCT: 38 % — ABNORMAL LOW (ref 39.0–52.0)
Lymphocytes Relative: 25 % (ref 12–46)
MCH: 25 pg — ABNORMAL LOW (ref 26.0–34.0)
MCHC: 33.9 g/dL (ref 30.0–36.0)
MCV: 73.6 fL — ABNORMAL LOW (ref 78.0–100.0)
Monocytes Absolute: 0.6 10*3/uL (ref 0.1–1.0)
RDW: 18 % — ABNORMAL HIGH (ref 11.5–15.5)
WBC: 10.4 10*3/uL (ref 4.0–10.5)

## 2012-08-10 LAB — COMPREHENSIVE METABOLIC PANEL
AST: 16 U/L (ref 0–37)
BUN: 9 mg/dL (ref 6–23)
Calcium: 9.7 mg/dL (ref 8.4–10.5)
Chloride: 104 mEq/L (ref 96–112)
Creat: 1.08 mg/dL (ref 0.50–1.35)

## 2012-08-11 NOTE — Progress Notes (Signed)
Reminder in epic to follow up in 6 months °

## 2012-08-14 ENCOUNTER — Ambulatory Visit (HOSPITAL_COMMUNITY): Payer: Self-pay

## 2012-08-16 NOTE — Progress Notes (Signed)
LMOM to call.

## 2012-08-16 NOTE — Progress Notes (Signed)
No PCP on file

## 2012-08-16 NOTE — Progress Notes (Signed)
Pt returned call and was informed.

## 2012-08-16 NOTE — Progress Notes (Signed)
PLEASE CALL PT.  HIS BLOOD COUNT IS STABLE AT 12.9. IT SUGGESTS HE HAS MILDLY ACTIVE DISEASE. HE SHOULD STOP SMOKING ALL TOGETHER. HIS BLOOD CHEMISTRIES SHOW  NORMAL KIDNEY/LOVER FUNCTION.

## 2012-08-17 ENCOUNTER — Encounter (HOSPITAL_COMMUNITY): Payer: Self-pay | Attending: Gastroenterology

## 2012-08-17 DIAGNOSIS — K5000 Crohn's disease of small intestine without complications: Secondary | ICD-10-CM | POA: Insufficient documentation

## 2012-08-17 MED ORDER — SODIUM CHLORIDE 0.9 % IJ SOLN
10.0000 mL | INTRAMUSCULAR | Status: DC | PRN
  Administered 2012-08-17: 10 mL via INTRAVENOUS

## 2012-08-17 MED ORDER — SODIUM CHLORIDE 0.9 % IV SOLN
INTRAVENOUS | Status: DC
  Administered 2012-08-17: 12:00:00 via INTRAVENOUS

## 2012-08-17 MED ORDER — SODIUM CHLORIDE 0.9 % IV SOLN
400.0000 mg | INTRAVENOUS | Status: DC
  Administered 2012-08-17: 400 mg via INTRAVENOUS
  Filled 2012-08-17: qty 40

## 2012-08-17 NOTE — Progress Notes (Signed)
Tolerated well

## 2012-10-12 ENCOUNTER — Ambulatory Visit (HOSPITAL_COMMUNITY): Payer: Self-pay

## 2012-10-18 ENCOUNTER — Ambulatory Visit (HOSPITAL_COMMUNITY): Payer: Self-pay

## 2012-10-19 ENCOUNTER — Encounter (HOSPITAL_COMMUNITY): Payer: Self-pay | Attending: Gastroenterology

## 2012-10-19 VITALS — BP 153/93 | HR 70 | Temp 98.1°F | Resp 18 | Wt 156.0 lb

## 2012-10-19 DIAGNOSIS — K5000 Crohn's disease of small intestine without complications: Secondary | ICD-10-CM | POA: Insufficient documentation

## 2012-10-19 MED ORDER — SODIUM CHLORIDE 0.9 % IV SOLN
Freq: Once | INTRAVENOUS | Status: AC
  Administered 2012-10-19: 11:00:00 via INTRAVENOUS

## 2012-10-19 MED ORDER — SODIUM CHLORIDE 0.9 % IV SOLN
400.0000 mg | Freq: Once | INTRAVENOUS | Status: AC
  Administered 2012-10-19: 400 mg via INTRAVENOUS
  Filled 2012-10-19: qty 40

## 2012-10-19 NOTE — Progress Notes (Signed)
Tolerated well

## 2012-12-14 ENCOUNTER — Inpatient Hospital Stay (HOSPITAL_COMMUNITY): Admission: RE | Admit: 2012-12-14 | Payer: Self-pay | Source: Ambulatory Visit

## 2012-12-14 ENCOUNTER — Ambulatory Visit (HOSPITAL_COMMUNITY): Payer: Self-pay

## 2012-12-14 ENCOUNTER — Ambulatory Visit (HOSPITAL_COMMUNITY)
Admission: RE | Admit: 2012-12-14 | Discharge: 2012-12-14 | Disposition: A | Discharge status: Home / Self Care | Payer: Self-pay | Source: Ambulatory Visit | Attending: Gastroenterology | Admitting: Gastroenterology

## 2012-12-14 DIAGNOSIS — K5090 Crohn's disease, unspecified, without complications: Secondary | ICD-10-CM | POA: Insufficient documentation

## 2012-12-14 MED ORDER — DEXTROSE 5 % IV SOLN
INTRAVENOUS | Status: DC
  Administered 2012-12-14: 500 mL via INTRAVENOUS

## 2012-12-14 MED ORDER — SODIUM CHLORIDE 0.9 % IV SOLN: INTRAVENOUS | Status: DC

## 2012-12-14 MED ORDER — SODIUM CHLORIDE 0.9 % IV SOLN
400.0000 mg | Freq: Once | INTRAVENOUS | Status: AC
  Administered 2012-12-14: 400 mg via INTRAVENOUS
  Filled 2012-12-14: qty 40

## 2012-12-14 NOTE — Progress Notes (Signed)
Pt completed Remicaid 46m IV infusion over 2.5 hrs without and signs and symptoms of reaction.  Remicaid infusion ordered per Dr. FOneida Alarfor Hx. Of Crohns.

## 2013-01-24 ENCOUNTER — Ambulatory Visit: Payer: Self-pay | Admitting: Gastroenterology

## 2013-01-24 ENCOUNTER — Telehealth: Payer: Self-pay | Admitting: *Deleted

## 2013-01-24 NOTE — Telephone Encounter (Signed)
Pt was a no show

## 2013-01-24 NOTE — Telephone Encounter (Signed)
REVIEWED.  

## 2013-02-08 ENCOUNTER — Encounter (HOSPITAL_COMMUNITY): Admission: RE | Admit: 2013-02-08 | Payer: MEDICAID | Source: Ambulatory Visit

## 2013-02-08 ENCOUNTER — Encounter (HOSPITAL_COMMUNITY)
Admission: RE | Admit: 2013-02-08 | Discharge: 2013-02-08 | Disposition: A | Discharge status: Home / Self Care | Payer: Self-pay | Source: Ambulatory Visit | Attending: Gastroenterology | Admitting: Gastroenterology

## 2013-02-08 DIAGNOSIS — K5000 Crohn's disease of small intestine without complications: Secondary | ICD-10-CM | POA: Insufficient documentation

## 2013-02-08 MED ORDER — SODIUM CHLORIDE 0.9 % IV SOLN: INTRAVENOUS | Status: DC

## 2013-02-08 MED ORDER — SODIUM CHLORIDE 0.9 % IV SOLN
INTRAVENOUS | Status: DC
  Administered 2013-02-08: 11:00:00 via INTRAVENOUS

## 2013-02-08 MED ORDER — SODIUM CHLORIDE 0.9 % IV SOLN: 400.0000 mg | Freq: Once | INTRAVENOUS | Status: DC

## 2013-02-08 MED ORDER — SODIUM CHLORIDE 0.9 % IV SOLN
400.0000 mg | INTRAVENOUS | Status: DC
  Administered 2013-02-08: 400 mg via INTRAVENOUS
  Filled 2013-02-08: qty 40

## 2013-02-13 ENCOUNTER — Inpatient Hospital Stay (HOSPITAL_COMMUNITY): Admission: RE | Admit: 2013-02-13 | Payer: Self-pay | Source: Ambulatory Visit

## 2013-02-21 ENCOUNTER — Encounter: Payer: Self-pay | Admitting: Gastroenterology

## 2013-02-21 ENCOUNTER — Ambulatory Visit (INDEPENDENT_AMBULATORY_CARE_PROVIDER_SITE_OTHER): Payer: Self-pay | Admitting: Gastroenterology

## 2013-02-21 VITALS — BP 120/80 | HR 68 | Temp 97.6°F | Ht 69.0 in | Wt 151.4 lb

## 2013-02-21 DIAGNOSIS — K5010 Crohn's disease of large intestine without complications: Secondary | ICD-10-CM

## 2013-02-21 DIAGNOSIS — K5000 Crohn's disease of small intestine without complications: Secondary | ICD-10-CM

## 2013-02-21 NOTE — Progress Notes (Signed)
  Subjective:    Patient ID: Joshua Robertson, male    DOB: 04-Dec-1975, 37 y.o.   MRN: [id number] MUSE,ROCHELLE D., PA-C  HPI HURTING A LOT MORE. MORE LOOSE STOOLS (#6-7):2-3 DAYS A WEEK FOR PAST 3 MOS. CAN HAVE #1 OR 2.GETTING REMICADE EVERY 8 HRS. 3 WEEKS AGO CUT DOWN ON CIGARETTES: 5/DAY. LOST 8 LBS SINCE FEB 2014. NO NEW STRESS. MILD 1-2X/WK. MAY HURTING 2-3 DAYS/WK (DULL ACHY, IN MID ON L AND R, NO RADIATION, TRIGGERS: NONE) FOOD DOESN'T SEEM TO MAKE IT BETTER OR WORSE. EATING BETTER. CAN TELL WHEN IT'S TIME FOR REMICADE: MORE LOOSE STOOLS AND PAIN.   PT DENIES FEVER, CHILLS, BRBPR,  vomiting, OR melena.  Past Medical History  Diagnosis Date  . Helicobacter pylori gastritis 2009  . Spondyloarthropathy 2009 EROSIVE  . Sacroiliitis 2009  . Migraine headache     NONE SINCE AGE 77  . BMI between 19-24,adult JAN 2010 149 LBS  . Crohn's disease AUG 2009 NUR [facility]    HB 9.4 FERRITIN 26  140 LBS; no serologies-pt can't afford test  . Hypertension    No past surgical history on file.  Allergies  Allergen Reactions  . Azathioprine     REACTION: VOMITING  . Penicillins Swelling and Rash   Current Outpatient Prescriptions  Medication Sig Dispense Refill  . benazepril (LOTENSIN) 40 MG tablet Take 1 tablet (40 mg total) by mouth daily.    . cloNIDine (CATAPRES) 0.1 MG tablet Take 1 tablet (0.1 mg total) by mouth 2 (two) times daily.    . hydrochlorothiazide (MICROZIDE) 12.5 MG capsule Take 12.5 mg by mouth daily.    Marland Kitchen inFLIXimab (REMICADE) 100 MG injection Inject into the vein every 8 (eight) weeks.     . benazepril (LOTENSIN) 5 MG tablet Take 8 tablets (40 mg total) by mouth daily.    . metoCLOPramide (REGLAN) 10 MG tablet Take 1 tablet (10 mg total) by mouth every 6 (six) hours as needed (nausea or headache).      Review of Systems     Objective:   Physical Exam  Vitals reviewed. Constitutional: He is oriented to person, place, and time. He appears well-nourished. No distress.   HENT:  Head: Normocephalic and atraumatic.  Mouth/Throat: Oropharynx is clear and moist. No oropharyngeal exudate.  Eyes: Pupils are equal, round, and reactive to light. No scleral icterus.  Neck: Normal range of motion. Neck supple.  Cardiovascular: Normal rate, regular rhythm and normal heart sounds.   Pulmonary/Chest: Effort normal and breath sounds normal. No respiratory distress.  Abdominal: Soft. Bowel sounds are normal. He exhibits no distension. There is no tenderness.  Musculoskeletal: He exhibits no edema.  Lymphadenopathy:    He has no cervical adenopathy.  Neurological: He is alert and oriented to person, place, and time.  NO FOCAL DEFICITS   Psychiatric: He has a normal mood and affect.          Assessment & Plan:

## 2013-02-21 NOTE — Patient Instructions (Signed)
START PENTASA. COMPLETE YOUR PAPERWORK FOR PENTASA ASSISTANCE.  CONTINUE TO WATCH FOR FOOD THAT MAY MAKE YOUR SYMPTOMS WORSE.  FOLLOW UP IN 6 MOS.

## 2013-02-21 NOTE — Assessment & Plan Note (Signed)
Sx NOT IDEALLY CONTROLLED BETWEEN REMICADE.  DISCUSSED BENEFITS V. RISKS OF IMURAN AND PENTASA. WILL TRY PENTASA 2 PO TID. PT WILL APPLY FOR ASSISTANCE. CONTINUE REMICADE. OPV IN 6 MOS.

## 2013-02-21 NOTE — Progress Notes (Signed)
CC'd to PCP 

## 2013-03-05 NOTE — Progress Notes (Signed)
Reminder appt made

## 2013-04-05 ENCOUNTER — Encounter (HOSPITAL_COMMUNITY)
Admission: RE | Admit: 2013-04-05 | Discharge: 2013-04-05 | Disposition: A | Discharge status: Home / Self Care | Payer: Self-pay | Source: Ambulatory Visit | Attending: Gastroenterology | Admitting: Gastroenterology

## 2013-04-05 DIAGNOSIS — K5090 Crohn's disease, unspecified, without complications: Secondary | ICD-10-CM | POA: Insufficient documentation

## 2013-04-05 MED ORDER — SODIUM CHLORIDE 0.9 % IV SOLN
INTRAVENOUS | Status: DC
  Administered 2013-04-05: 11:00:00 via INTRAVENOUS

## 2013-04-05 MED ORDER — SODIUM CHLORIDE 0.9 % IV SOLN
400.0000 mg | Freq: Once | INTRAVENOUS | Status: AC
  Administered 2013-04-05: 400 mg via INTRAVENOUS
  Filled 2013-04-05: qty 40

## 2013-04-05 NOTE — Progress Notes (Signed)
remicade infusion complete. Tubing flushed with 0.9 saline at 20 ml/hr for 15 min. D/c ambulatory in good condition.

## 2013-05-31 ENCOUNTER — Inpatient Hospital Stay (HOSPITAL_COMMUNITY): Admission: RE | Admit: 2013-05-31 | Payer: Self-pay | Source: Ambulatory Visit

## 2013-05-31 ENCOUNTER — Encounter (HOSPITAL_COMMUNITY)
Admission: RE | Admit: 2013-05-31 | Discharge: 2013-05-31 | Disposition: A | Discharge status: Home / Self Care | Payer: Self-pay | Source: Ambulatory Visit | Attending: *Deleted | Admitting: *Deleted

## 2013-05-31 ENCOUNTER — Telehealth: Payer: Self-pay | Admitting: *Deleted

## 2013-05-31 ENCOUNTER — Ambulatory Visit: Payer: Self-pay | Admitting: Gastroenterology

## 2013-05-31 NOTE — Progress Notes (Signed)
Pt "no show" for scheduled remicade infusion. Dr Oneida Alar' office notified. Unable to reach pt by phone.

## 2013-05-31 NOTE — Telephone Encounter (Signed)
Joshua Robertson from short stayed called to let us know that pt was a no show for his remicade appointment this morning, Joshua Robertson stated number in chart is not a working number. If pt comes to his 2:00 appointment today with Dr. Oneida Alar have him call Joshua Robertson at short stay [phone]

## 2013-06-01 ENCOUNTER — Ambulatory Visit: Payer: Self-pay | Admitting: Gastroenterology

## 2013-06-01 ENCOUNTER — Encounter (HOSPITAL_COMMUNITY): Payer: Self-pay

## 2013-06-04 ENCOUNTER — Encounter (HOSPITAL_COMMUNITY)
Admission: RE | Admit: 2013-06-04 | Discharge: 2013-06-04 | Disposition: A | Discharge status: Home / Self Care | Payer: Self-pay | Source: Ambulatory Visit | Attending: Gastroenterology | Admitting: Gastroenterology

## 2013-06-04 DIAGNOSIS — K5090 Crohn's disease, unspecified, without complications: Secondary | ICD-10-CM | POA: Insufficient documentation

## 2013-06-04 MED ORDER — SODIUM CHLORIDE 0.9 % IV SOLN
INTRAVENOUS | Status: DC
  Administered 2013-06-04: 250 mL via INTRAVENOUS

## 2013-06-04 MED ORDER — SODIUM CHLORIDE 0.9 % IV SOLN
400.0000 mg | INTRAVENOUS | Status: DC
  Administered 2013-06-04: 400 mg via INTRAVENOUS
  Filled 2013-06-04: qty 40

## 2013-06-04 NOTE — Progress Notes (Signed)
remicade complete. Tolerated well.

## 2013-06-04 NOTE — Progress Notes (Signed)
Here for remicade infusion. Voices no c/o at this time.

## 2013-06-04 NOTE — Progress Notes (Signed)
remicade infusing without difficulty. Voices no c/o at this time.

## 2013-06-04 NOTE — Progress Notes (Signed)
Voices no c/o at this time. D/C to home in good condition.

## 2013-06-04 NOTE — Progress Notes (Signed)
remicade started.

## 2013-07-04 ENCOUNTER — Ambulatory Visit: Payer: Self-pay | Admitting: Gastroenterology

## 2013-07-05 ENCOUNTER — Ambulatory Visit: Payer: Self-pay | Admitting: Gastroenterology

## 2013-07-08 ENCOUNTER — Encounter (HOSPITAL_COMMUNITY): Payer: Self-pay | Admitting: Emergency Medicine

## 2013-07-08 DIAGNOSIS — G43909 Migraine, unspecified, not intractable, without status migrainosus: Secondary | ICD-10-CM | POA: Insufficient documentation

## 2013-07-08 DIAGNOSIS — Z79899 Other long term (current) drug therapy: Secondary | ICD-10-CM | POA: Insufficient documentation

## 2013-07-08 DIAGNOSIS — Z880 Allergy status to penicillin: Secondary | ICD-10-CM | POA: Insufficient documentation

## 2013-07-08 DIAGNOSIS — I10 Essential (primary) hypertension: Secondary | ICD-10-CM | POA: Insufficient documentation

## 2013-07-08 DIAGNOSIS — F17200 Nicotine dependence, unspecified, uncomplicated: Secondary | ICD-10-CM | POA: Insufficient documentation

## 2013-07-08 DIAGNOSIS — Z8619 Personal history of other infectious and parasitic diseases: Secondary | ICD-10-CM | POA: Insufficient documentation

## 2013-07-08 DIAGNOSIS — Z8719 Personal history of other diseases of the digestive system: Secondary | ICD-10-CM | POA: Insufficient documentation

## 2013-07-08 DIAGNOSIS — M542 Cervicalgia: Secondary | ICD-10-CM | POA: Insufficient documentation

## 2013-07-08 NOTE — ED Notes (Signed)
Pain on left side of neck, denies injury

## 2013-07-09 ENCOUNTER — Emergency Department (HOSPITAL_COMMUNITY): Payer: Self-pay

## 2013-07-09 ENCOUNTER — Emergency Department (HOSPITAL_COMMUNITY)
Admission: EM | Admit: 2013-07-09 | Discharge: 2013-07-09 | Disposition: A | Discharge status: Home / Self Care | Payer: Self-pay | Attending: Emergency Medicine | Admitting: Emergency Medicine

## 2013-07-09 DIAGNOSIS — M542 Cervicalgia: Secondary | ICD-10-CM

## 2013-07-09 LAB — CBC WITH DIFFERENTIAL/PLATELET
BASOS ABS: 0.1 10*3/uL (ref 0.0–0.1)
Basophils Relative: 1 % (ref 0–1)
Eosinophils Absolute: 0.3 10*3/uL (ref 0.0–0.7)
Eosinophils Relative: 3 % (ref 0–5)
HEMATOCRIT: 31.9 % — AB (ref 39.0–52.0)
Hemoglobin: 10.8 g/dL — ABNORMAL LOW (ref 13.0–17.0)
LYMPHS ABS: 3.2 10*3/uL (ref 0.7–4.0)
LYMPHS PCT: 33 % (ref 12–46)
MCH: 26 pg (ref 26.0–34.0)
MCHC: 33.9 g/dL (ref 30.0–36.0)
MCV: 76.9 fL — ABNORMAL LOW (ref 78.0–100.0)
Monocytes Absolute: 0.6 10*3/uL (ref 0.1–1.0)
Monocytes Relative: 7 % (ref 3–12)
NEUTROS ABS: 5.3 10*3/uL (ref 1.7–7.7)
Neutrophils Relative %: 56 % (ref 43–77)
PLATELETS: 267 10*3/uL (ref 150–400)
RBC: 4.15 MIL/uL — AB (ref 4.22–5.81)
RDW: 15.1 % (ref 11.5–15.5)
WBC: 9.4 10*3/uL (ref 4.0–10.5)

## 2013-07-09 LAB — BASIC METABOLIC PANEL
BUN: 12 mg/dL (ref 6–23)
CHLORIDE: 96 meq/L (ref 96–112)
CO2: 30 meq/L (ref 19–32)
Calcium: 9.1 mg/dL (ref 8.4–10.5)
Creatinine, Ser: 1.09 mg/dL (ref 0.50–1.35)
GFR calc Af Amer: >90 mL/min (ref >90)
GFR calc non Af Amer: 85 mL/min — ABNORMAL LOW (ref >90)
GLUCOSE: 98 mg/dL (ref 70–99)
Potassium: 3.7 mEq/L (ref 3.7–5.3)
SODIUM: 136 meq/L — AB (ref 137–147)

## 2013-07-09 MED ORDER — MORPHINE SULFATE 4 MG/ML IJ SOLN
4.0000 mg | Freq: Once | INTRAMUSCULAR | Status: AC
  Administered 2013-07-09: 4 mg via INTRAVENOUS
  Filled 2013-07-09: qty 1

## 2013-07-09 MED ORDER — ONDANSETRON HCL 4 MG/2ML IJ SOLN
4.0000 mg | Freq: Once | INTRAMUSCULAR | Status: AC
  Administered 2013-07-09: 4 mg via INTRAVENOUS
  Filled 2013-07-09: qty 2

## 2013-07-09 MED ORDER — IOHEXOL 300 MG/ML  SOLN
75.0000 mL | Freq: Once | INTRAMUSCULAR | Status: AC | PRN
  Administered 2013-07-09: 75 mL via INTRAVENOUS

## 2013-07-09 MED ORDER — KETOROLAC TROMETHAMINE 30 MG/ML IJ SOLN
30.0000 mg | Freq: Once | INTRAMUSCULAR | Status: AC
  Administered 2013-07-09: 30 mg via INTRAVENOUS
  Filled 2013-07-09: qty 1

## 2013-07-09 MED ORDER — IOHEXOL 300 MG/ML  SOLN: 100.0000 mL | Freq: Once | INTRAMUSCULAR | Status: DC | PRN

## 2013-07-09 NOTE — ED Provider Notes (Signed)
CSN: [id number]     Arrival date & time 07/08/13  2344 History   First MD Initiated Contact with Patient 07/09/13 0127     Chief Complaint  Patient presents with  . Neck Pain   (Consider location/radiation/quality/duration/timing/severity/associated sxs/prior Treatment) Patient is a 38 y.o. male presenting with neck pain. The history is provided by the patient.  Neck Pain He is of pain and swelling in the left side of his neck anteriorly. This is been present for the last week and is getting worse. Pain is worse with turning his head. There's no pain with swallowing he has no difficulty swallowing. He denies fever or chills. He rates pain at 8/10. He denies any,. He has not taken anything to try and help his pain. Of note, he is treated for Crohn's diseaseh periodic injections of Remicade.  Past Medical History  Diagnosis Date  . Helicobacter pylori gastritis 2009  . Spondyloarthropathy 2009 EROSIVE  . Sacroiliitis 2009  . Migraine headache     NONE SINCE AGE 57  . BMI between 19-24,adult JAN 2010 149 LBS  . Crohn's disease AUG 2009 NUR [facility]    HB 9.4 FERRITIN 26  140 LBS; no serologies-pt can't afford test  . Hypertension    History reviewed. No pertinent past surgical history. Family History  Problem Relation Age of Onset  . Colon cancer Neg Hx   . Colon polyps Neg Hx   . Inflammatory bowel disease Neg Hx   . Hypertension Mother   . Hypertension Father    History  Substance Use Topics  . Smoking status: Current Every Day Smoker -- 0.50 packs/day    Types: Cigarettes  . Smokeless tobacco: Not on file  . Alcohol Use: No    Review of Systems  Musculoskeletal: Positive for neck pain.  All other systems reviewed and are negative.    Allergies  Azathioprine and Penicillins  Home Medications   Current Outpatient Rx  Name  Route  Sig  Dispense  Refill  . benazepril (LOTENSIN) 40 MG tablet   Oral   Take 1 tablet (40 mg total) by mouth daily.   30 tablet   2   .  cloNIDine (CATAPRES) 0.1 MG tablet   Oral   Take 1 tablet (0.1 mg total) by mouth 2 (two) times daily.   60 tablet   11   . hydrochlorothiazide (MICROZIDE) 12.5 MG capsule   Oral   Take 12.5 mg by mouth daily.         Marland Kitchen inFLIXimab (REMICADE) 100 MG injection   Intravenous   Inject into the vein every 8 (eight) weeks.          . benazepril (LOTENSIN) 5 MG tablet   Oral   Take 8 tablets (40 mg total) by mouth daily.   30 tablet   0   . metoCLOPramide (REGLAN) 10 MG tablet   Oral   Take 1 tablet (10 mg total) by mouth every 6 (six) hours as needed (nausea or headache).   30 tablet   0    BP 118/75  Pulse 89  Temp(Src) 98.2 F (36.8 C) (Oral)  Resp 20  Ht 5' 9.5" (1.765 m)  Wt 155 lb (70.308 kg)  BMI 22.57 kg/m2  SpO2 100% Physical Exam  Nursing note and vitals reviewed.  38 year old male, resting comfortably and in no acute distress. Vital signs are normal. Oxygen saturation is 100%, which is normal. Head is normocephalic and atraumatic. PERRLA, EOMI. Oropharynx is  clear. Neck is nontender and supple without adenopathy or JVD. No mass or swelling is detected, but there is tenderness to palpation in the left side of the neck over the sternocleidomastoid muscle and soft tissue anterior to the general, the mastoid muscle. Back is nontender and there is no CVA tenderness. Lungs are clear without rales, wheezes, or rhonchi. Chest is nontender. Heart has regular rate and rhythm without murmur. Abdomen is soft, flat, nontender without masses or hepatosplenomegaly and peristalsis is normoactive. Extremities have no cyanosis or edema, full range of motion is present. Skin is warm and dry without rash. Neurologic: Mental status is normal, cranial nerves are intact, there are no motor or sensory deficits.  ED Course  Procedures (including critical care time) Labs Review Results for orders placed during the hospital encounter of 07/09/13  CBC WITH DIFFERENTIAL      Result  Value Range   WBC 9.4  4.0 - 10.5 K/uL   RBC 4.15 (*) 4.22 - 5.81 MIL/uL   Hemoglobin 10.8 (*) 13.0 - 17.0 g/dL   HCT 31.9 (*) 39.0 - 52.0 %   MCV 76.9 (*) 78.0 - 100.0 fL   MCH 26.0  26.0 - 34.0 pg   MCHC 33.9  30.0 - 36.0 g/dL   RDW 15.1  11.5 - 15.5 %   Platelets 267  150 - 400 K/uL   Neutrophils Relative % 56  43 - 77 %   Neutro Abs 5.3  1.7 - 7.7 K/uL   Lymphocytes Relative 33  12 - 46 %   Lymphs Abs 3.2  0.7 - 4.0 K/uL   Monocytes Relative 7  3 - 12 %   Monocytes Absolute 0.6  0.1 - 1.0 K/uL   Eosinophils Relative 3  0 - 5 %   Eosinophils Absolute 0.3  0.0 - 0.7 K/uL   Basophils Relative 1  0 - 1 %   Basophils Absolute 0.1  0.0 - 0.1 K/uL  BASIC METABOLIC PANEL      Result Value Range   Sodium 136 (*) 137 - 147 mEq/L   Potassium 3.7  3.7 - 5.3 mEq/L   Chloride 96  96 - 112 mEq/L   CO2 30  19 - 32 mEq/L   Glucose, Bld 98  70 - 99 mg/dL   BUN 12  6 - 23 mg/dL   Creatinine, Ser 1.09  0.50 - 1.35 mg/dL   Calcium 9.1  8.4 - 10.5 mg/dL   GFR calc non Af Amer 85 (*) >90 mL/min   GFR calc Af Amer >90  >90 mL/min   Imaging Review Ct Soft Tissue Neck W Contrast  07/09/2013   CLINICAL DATA:  Left-sided neck pain for 1 week radiating to left jaw. History of Crohn disease.  EXAM: CT NECK WITH CONTRAST  TECHNIQUE: Multidetector CT imaging of the neck was performed using the standard protocol following the bolus administration of intravenous contrast.  CONTRAST:  51m OMNIPAQUE IOHEXOL 300 MG/ML  SOLN  COMPARISON:  None available for comparison at time of study interpretation.  FINDINGS: 5 mm calcification along the the posterior aspect of the tongue, between the soft palate, sagittal 51/93. Aerodigestive tract is otherwise unremarkable. Preservation parapharyngeal fat tissue planes. Normal appearance of the major salivary glands. Normal appearance of thyroid gland.  Cervical vessels are unremarkable. No lymphadenopathy by CT size criteria. Included view of the lung demonstrates paraseptal  and mild centrilobular emphysema. Right supraclavicular intravenous gas consistent with recent instrumentation. Included view of the paranasal sinuses  and mastoid air cells are well aerated. Moderate to severe C5-6 degenerative disc disease.  IMPRESSION: No acute process within the neck.  5 mm calcification interposed between the posterior tongue and soft palate, recommend direct inspection.   Electronically Signed   By: Elon Alas   On: 07/09/2013 02:58   Images viewed by me.  MDM   1. Neck pain on left side    Neck pain and subjective sense of swelling. I did not see any objective findings to explain it. However, he is somewhat immunosuppressed because of Remicade infusions, so he will be sent for CT scan to further evaluate it.  CT shows no evidence of swelling, but there does appear to be any lesion and around the base of the tongue. I'm not able to see anything on direct inspection, so he is referred to ENT for followup.  Delora Fuel, MD 61/47/09 [id number]

## 2013-07-09 NOTE — Discharge Instructions (Signed)
Your CAT scan did not show any actual swelling, but there is a suggestion of something at the base of your tongue. Please make an appointment with the ENT physician to have him look at it directly.

## 2013-07-09 NOTE — ED Notes (Signed)
Pain in L side of neck began a week ago.  Denies fevers, sore throat, ear pain.  Reports it does radiate to L jaw.  No dental issues noted.

## 2013-07-11 ENCOUNTER — Ambulatory Visit (INDEPENDENT_AMBULATORY_CARE_PROVIDER_SITE_OTHER): Payer: Self-pay | Admitting: Gastroenterology

## 2013-07-11 ENCOUNTER — Encounter: Payer: Self-pay | Admitting: Gastroenterology

## 2013-07-11 VITALS — BP 109/69 | HR 68 | Temp 97.0°F | Ht 70.0 in | Wt 155.6 lb

## 2013-07-11 DIAGNOSIS — K5000 Crohn's disease of small intestine without complications: Secondary | ICD-10-CM

## 2013-07-11 DIAGNOSIS — K610 Anal abscess: Secondary | ICD-10-CM | POA: Insufficient documentation

## 2013-07-11 DIAGNOSIS — K612 Anorectal abscess: Secondary | ICD-10-CM

## 2013-07-11 DIAGNOSIS — D509 Iron deficiency anemia, unspecified: Secondary | ICD-10-CM

## 2013-07-11 NOTE — Patient Instructions (Signed)
1. Once I review records from [facility], I will discuss further management with Dr. Oneida Alar. We will give you a call.

## 2013-07-11 NOTE — Progress Notes (Addendum)
Primary Care Physician: Raiford Simmonds., PA-C  Primary Gastroenterologist:  Barney Drain, MD   Chief Complaint  Patient presents with  . Rectal Pain    HPI: Joshua Robertson is a 38 y.o. male here for followup. Last seen September 2014. History of Crohn's disease. On Remicade every 8 weeks. Last infusion on December 22nd 2014. At last office visit Pentasa recommended. Rash with pentasa.  [place] 4-7 for past few weeks. Anywhere 1-3 per day. No rectal bleeding. Couple of weeks before Remicade due starts hurting in lower abdomen. No increase in stooling during this time. Appetite okay until stomach hurts. No vomiting. Some reflux related to certain food. Avoids this foods. Two abscess on perianal area 1 on each side. Seen in the emergency department at [facility] on 2 occasions and had to have these lanced. Last time in 04/2013. Last abscess continues to drain off and on, last time about one week ago. Drains pus. No h/o fistula in the past.   Has been on remicade for three years.   Past Medical History  Diagnosis Date  . Helicobacter pylori gastritis 2009  . Spondyloarthropathy 2009 EROSIVE  . Sacroiliitis 2009  . Migraine headache     NONE SINCE AGE 19  . BMI between 19-24,adult JAN 2010 149 LBS  . Crohn's disease AUG 2009 NUR [place]    HB 9.4 FERRITIN 26  140 LBS; no serologies-pt can't afford test  . Hypertension     Past Surgical History  Procedure Laterality Date  . Colonoscopy    . Colonoscopy N/A 07/30/2013    stenotic IC VALVE. UNABLE TO INTUBATE ILEUM.   Current Outpatient Prescriptions  Medication Sig Dispense Refill  . benazepril (LOTENSIN) 40 MG tablet Take 1 tablet (40 mg total) by mouth daily.  30 tablet  2  . cloNIDine (CATAPRES) 0.1 MG tablet Take 1 tablet (0.1 mg total) by mouth 2 (two) times daily.  60 tablet  11  . hydrochlorothiazide (MICROZIDE) 12.5 MG capsule Take 12.5 mg by mouth daily.      Marland Kitchen inFLIXimab (REMICADE) 100 MG injection Inject into the vein  every 8 (eight) weeks.        No current facility-administered medications for this visit.    Allergies as of 07/11/2013 - Review Complete 07/11/2013  Allergen Reaction Noted  . Azathioprine  11/25/2009  . Penicillins Swelling and Rash 08/15/2008  . Pentasa [mesalamine er] Rash 07/11/2013    ROS:  General: Negative for anorexia, weight loss, fever, chills, fatigue, weakness. ENT: Negative for hoarseness, difficulty swallowing , nasal congestion. CV: Negative for chest pain, angina, palpitations, dyspnea on exertion, peripheral edema.  Respiratory: Negative for dyspnea at rest, dyspnea on exertion, cough, sputum, wheezing.  GI: See history of present illness. GU:  Negative for dysuria, hematuria, urinary incontinence, urinary frequency, nocturnal urination.  Endo: Negative for unusual weight change.    Physical Examination:   BP 109/69  Pulse 68  Temp(Src) 97 F (36.1 C) (Oral)  Ht 5' 10"  (1.778 m)  Wt 155 lb 9.6 oz (70.58 kg)  BMI 22.33 kg/m2  General: Well-nourished, well-developed in no acute distress.  Eyes: No icterus. Mouth: Oropharyngeal mucosa moist and pink , no lesions erythema or exudate. Lungs: Clear to auscultation bilaterally.  Heart: Regular rate and rhythm, no murmurs rubs or gallops.  Abdomen: Bowel sounds are normal, nontender, nondistended, no hepatosplenomegaly or masses, no abdominal bruits or hernia , no rebound or guarding.   Rectal: External exam only. Healed lesions in the  perianal area at the 5 and 7:00. Was not able to produce any drainage. Slightly tender to exam. No erythema. Extremities: No lower extremity edema. No clubbing or deformities. Neuro: Alert and oriented x 4   Skin: Warm and dry, no jaundice.   Psych: Alert and cooperative, normal mood and affect.  Labs:   Lab Results  Component Value Date   WBC 9.4 07/09/2013   HGB 10.8* 07/09/2013   HCT 31.9* 07/09/2013   MCV 76.9* 07/09/2013   PLT 267 07/09/2013   Lab Results  Component  Value Date   CREATININE 1.09 07/09/2013   BUN 12 07/09/2013   NA 136* 07/09/2013   K 3.7 07/09/2013   CL 96 07/09/2013   CO2 30 07/09/2013

## 2013-07-11 NOTE — Assessment & Plan Note (Addendum)
History of small bowel Crohn's disease diagnosed in 2009. First and only colonoscopy at that time. Continues to have flare of abdominal pain 5-6 weeks after Remicade infusions. He developed rash with Pentasa apparently but did not call when this occurred. Listed to have nausea and vomiting related to azathioprine the patient cannot recall. No active perianal drainage at this time. Question would be if he has developed a fistulas. He reports having CT scan at [facility] recently. We have requested these records. Once reviewed I will touch base with Dr. fields to determine next step. Patient ultimately need ileocolonoscopy to determine if disease is chronically active as well as evaluation of microcytic anemia which has been progressive. Further recommendations to follow.

## 2013-07-11 NOTE — Progress Notes (Signed)
cc'd to pcp 

## 2013-07-25 ENCOUNTER — Encounter (HOSPITAL_COMMUNITY): Payer: Self-pay

## 2013-07-25 NOTE — Progress Notes (Signed)
Reviewed records from [facility] from 07/08/2012: Creatinine 0.93, total bilirubin 0.8, alkaline phosphatase 61, AST 18, ALT 11, albumin 4.2, white blood cells 12,800, hemoglobin 12.9, hematocrit 39.5, MCV 74.9, platelets 205,000.  Patient had bedside I&D of left buttock/perianal abscess by Dr. Truddie Hidden in 07/08/2012.  NO CT available.

## 2013-07-25 NOTE — Progress Notes (Addendum)
Patient has required I+D twice last year of perianal/buttocks abscess. He has progressive microcytic anemia. Complains of GI symptoms 5-6 weeks after Remicade infusion.  May need ileocolonoscopy to evaluate for active Crohn's disease and/or some sort of imaging to look for fistula.

## 2013-07-25 NOTE — Progress Notes (Signed)
Please see [facility] response below.  Change remicade dosing to every six weeks. Schedule TCS on 07/30/13.  Schedule CT pelvis with rectal contrast to evaluate for perianal fistula.(creatinine 1.09 on 07/09/13).

## 2013-07-25 NOTE — Progress Notes (Addendum)
CHANGE REMICADE TO Q6 WEEKS SCHEDULE FOR TCS FEB 16 & CT PELVIS WITH RECTAL CONTRAST TO EVALUATE FOR PERIANAL FISTULA.

## 2013-07-26 NOTE — Progress Notes (Signed)
TRY TO SCHEDULE PT FOR BOTH ON FEB 16.

## 2013-07-26 NOTE — Progress Notes (Signed)
I called pt and informed him. Will forward to Darius Bump to schedule the TCS and CT.   I called Endo and spoke to Kim to ask if we needed to fax over new orders for the Remicade. She said his last treatment was 06/04/2013 and he has no showed or cancelled some.  She said pt is scheduled for Remicade on 07/30/2013 at 10:00 AM ( I called pt and he was not aware)  I told him we will check with Dr. Oneida Alar and see if she wants him to do Remicade on 2/16 or the TCS and we will let him know.   Dr. Oneida Alar, please advise!

## 2013-07-27 ENCOUNTER — Encounter (HOSPITAL_COMMUNITY): Payer: Self-pay | Admitting: Pharmacy Technician

## 2013-07-27 ENCOUNTER — Other Ambulatory Visit: Payer: Self-pay | Admitting: Gastroenterology

## 2013-07-27 DIAGNOSIS — K6289 Other specified diseases of anus and rectum: Secondary | ICD-10-CM

## 2013-07-27 NOTE — Progress Notes (Signed)
TCS is scheduled for Monday Feb 16th at 1:00 w/SLF, CT scan scheduled for Tues Feb 17th at 4:00 and I have called and [facility] for patient to return my call so he can come by and pick up prep instructions and prep

## 2013-07-30 ENCOUNTER — Encounter (HOSPITAL_COMMUNITY)
Admission: RE | Admit: 2013-07-30 | Discharge: 2013-07-30 | Disposition: A | Discharge status: Home / Self Care | Payer: Self-pay | Source: Ambulatory Visit | Attending: Gastroenterology | Admitting: Gastroenterology

## 2013-07-30 ENCOUNTER — Ambulatory Visit (HOSPITAL_COMMUNITY)
Admission: RE | Admit: 2013-07-30 | Discharge: 2013-07-30 | Disposition: A | Discharge status: Home / Self Care | Payer: Self-pay | Source: Ambulatory Visit | Attending: Gastroenterology | Admitting: Gastroenterology

## 2013-07-30 ENCOUNTER — Encounter (HOSPITAL_COMMUNITY)
Admission: RE | Disposition: A | Discharge status: Home / Self Care | Payer: Self-pay | Source: Ambulatory Visit | Attending: Gastroenterology

## 2013-07-30 ENCOUNTER — Encounter (HOSPITAL_COMMUNITY): Payer: Self-pay | Admitting: *Deleted

## 2013-07-30 DIAGNOSIS — K6289 Other specified diseases of anus and rectum: Secondary | ICD-10-CM

## 2013-07-30 DIAGNOSIS — K644 Residual hemorrhoidal skin tags: Secondary | ICD-10-CM | POA: Insufficient documentation

## 2013-07-30 DIAGNOSIS — K6389 Other specified diseases of intestine: Secondary | ICD-10-CM

## 2013-07-30 DIAGNOSIS — Z79899 Other long term (current) drug therapy: Secondary | ICD-10-CM | POA: Insufficient documentation

## 2013-07-30 DIAGNOSIS — I10 Essential (primary) hypertension: Secondary | ICD-10-CM | POA: Insufficient documentation

## 2013-07-30 DIAGNOSIS — K5000 Crohn's disease of small intestine without complications: Secondary | ICD-10-CM | POA: Insufficient documentation

## 2013-07-30 DIAGNOSIS — R109 Unspecified abdominal pain: Secondary | ICD-10-CM | POA: Insufficient documentation

## 2013-07-30 DIAGNOSIS — K5289 Other specified noninfective gastroenteritis and colitis: Secondary | ICD-10-CM | POA: Insufficient documentation

## 2013-07-30 HISTORY — PX: COLONOSCOPY: SHX5424

## 2013-07-30 SURGERY — COLONOSCOPY
Anesthesia: Moderate Sedation

## 2013-07-30 MED ORDER — SODIUM CHLORIDE 0.9 % IV SOLN: INTRAVENOUS | Status: DC

## 2013-07-30 MED ORDER — SIMETHICONE 40 MG/0.6ML PO SUSP
ORAL | Status: DC | PRN
  Administered 2013-07-30: 12:00:00

## 2013-07-30 MED ORDER — MIDAZOLAM HCL 5 MG/5ML IJ SOLN
INTRAMUSCULAR | Status: DC | PRN
  Administered 2013-07-30: 2 mg via INTRAVENOUS
  Administered 2013-07-30 (×3): 1 mg via INTRAVENOUS
  Administered 2013-07-30: 2 mg via INTRAVENOUS
  Administered 2013-07-30: 1 mg via INTRAVENOUS

## 2013-07-30 MED ORDER — SODIUM CHLORIDE 0.9 % IV SOLN
Freq: Once | INTRAVENOUS | Status: AC
  Administered 2013-07-30: 1000 mL via INTRAVENOUS

## 2013-07-30 MED ORDER — MEPERIDINE HCL 100 MG/ML IJ SOLN
INTRAMUSCULAR | Status: DC | PRN
  Administered 2013-07-30: 25 mg via INTRAVENOUS
  Administered 2013-07-30: 50 mg via INTRAVENOUS
  Administered 2013-07-30 (×2): 25 mg via INTRAVENOUS

## 2013-07-30 MED ORDER — INFLIXIMAB 100 MG IV SOLR: 100.0000 mg | INTRAVENOUS | Status: DC

## 2013-07-30 MED ORDER — MEPERIDINE HCL 100 MG/ML IJ SOLN
INTRAMUSCULAR | Status: AC
  Filled 2013-07-30: qty 2

## 2013-07-30 MED ORDER — MIDAZOLAM HCL 5 MG/5ML IJ SOLN
INTRAMUSCULAR | Status: DC
Start: 2013-07-30 — End: 2013-07-31
  Filled 2013-07-30: qty 10

## 2013-07-30 NOTE — Progress Notes (Signed)
Having colonoscopy today, will reschedule remicade for next week.

## 2013-07-30 NOTE — H&P (Signed)
Primary Care Physician:  Raiford Simmonds., PA-C Primary Gastroenterologist:  Dr. Oneida Alar  Pre-Procedure History & Physical: HPI:  Joshua Robertson is a 38 y.o. male here for ABDOMINAL PAIN/Crohn's not ideally controlled on REMICADE Q8 WEEKS.  Past Medical History  Diagnosis Date  . Helicobacter pylori gastritis 2009  . Spondyloarthropathy 2009 EROSIVE  . Sacroiliitis 2009  . Migraine headache     NONE SINCE AGE 65  . BMI between 19-24,adult JAN 2010 149 LBS  . Crohn's disease AUG 2009 NUR [facility]    HB 9.4 FERRITIN 26  140 LBS; no serologies-pt can't afford test  . Hypertension     Past Surgical History  Procedure Laterality Date  . Colonoscopy      Prior to Admission medications   Medication Sig Start Date End Date Taking? Authorizing Provider  benazepril (LOTENSIN) 40 MG tablet Take 1 tablet (40 mg total) by mouth daily. 06/02/11  Yes Kathalene Frames, MD  cloNIDine (CATAPRES) 0.1 MG tablet Take 1 tablet (0.1 mg total) by mouth 2 (two) times daily. 08/09/12  Yes [name], MD  Dextromethorphan-Guaifenesin (MUCINEX DM MAXIMUM STRENGTH) 60-1200 MG TB12 Take 1 tablet by mouth daily as needed (FOR COLD SYMPTOMS).   Yes Historical Provider, MD  hydrochlorothiazide (MICROZIDE) 12.5 MG capsule Take 12.5 mg by mouth daily.   Yes Historical Provider, MD  inFLIXimab (REMICADE) 100 MG injection Inject into the vein every 6 (six) weeks.    Yes Historical Provider, MD  acetaminophen (TYLENOL) 500 MG tablet Take 500 mg by mouth every 6 (six) hours as needed for mild pain, moderate pain or headache.    Historical Provider, MD    Allergies as of 07/27/2013 - Review Complete 07/27/2013  Allergen Reaction Noted  . Azathioprine  11/25/2009  . Penicillins Swelling and Rash 08/15/2008  . Pentasa [mesalamine er] Rash 07/11/2013    Family History  Problem Relation Age of Onset  . Colon cancer Neg Hx   . Colon polyps Neg Hx   . Inflammatory bowel disease Neg Hx   . Hypertension Mother   .  Hypertension Father     History   Social History  . Marital Status: Single    Spouse Name: N/A    Number of Children: N/A  . Years of Education: N/A   Occupational History  . Not on file.   Social History Main Topics  . Smoking status: Current Every Day Smoker -- 0.50 packs/day    Types: Cigarettes  . Smokeless tobacco: Not on file  . Alcohol Use: No  . Drug Use: Yes    Special: Marijuana  . Sexual Activity: Yes    Birth Control/ Protection: None   Other Topics Concern  . Not on file   Social History Narrative   Smokes cigs  & pot.    Review of Systems: See HPI, otherwise negative ROS   Physical Exam: BP 100/66  Pulse 86  Temp(Src) 98.2 F (36.8 C) (Oral)  Resp 13  Ht 5' 10"  (1.778 m)  Wt 155 lb (70.308 kg)  BMI 22.24 kg/m2  SpO2 97% General:   Alert,  pleasant and cooperative in NAD Head:  Normocephalic and atraumatic. Neck:  Supple; Lungs:  Clear throughout to auscultation.    Heart:  Regular rate and rhythm. Abdomen:  Soft, nontender and nondistended. Normal bowel sounds, without guarding, and without rebound.   Neurologic:  Alert and  oriented x4;  grossly normal neurologically.  Impression/Plan:     ABDOMINAL PAIN/Crohn's not ideally controlled on  REMICADE Q8 WEEKS.  PLAN:  1. TCS TODAY

## 2013-07-30 NOTE — Op Note (Addendum)
[facility] [address] [place], [address]   COLONOSCOPY PROCEDURE REPORT  PATIENT: Joshua Robertson, Joshua Robertson  MR#: [id number] BIRTHDATE: Jul 09, 1975 , 37  yrs. old GENDER: Male ENDOSCOPIST: Barney Drain, MD REFERRED ID:[id number] Muse, PA PROCEDURE DATE:  07/30/2013 PROCEDURE:   Colonoscopy with biopsy and Colonoscopy with balloon dilation INDICATIONS:PMHx: CROHN'S ILEITIS.  ON REMICADE FOR 2 YEARS. PRESENTS WITH ONTERMITTENT ABD PAIN AND LOOSE/WATERY/SOLID STOOLS. FEELS REMICADE DOESN'T LAST FOR 8 WEEKS DUE TO ABD PAIN.Marland Kitchen MEDICATIONS: Demerol 125 mg IV and Versed 8 mg IV DESCRIPTION OF PROCEDURE:    Physical exam was performed.  Informed consent was obtained from the patient after explaining the benefits, risks, and alternatives to procedure.  The patient was connected to monitor and placed in left lateral position. Continuous oxygen was provided by nasal cannula and IV medicine administered through an indwelling cannula.  After administration of sedation and rectal exam, the patients rectum was intubated and the EC-[id number] ([id number])  colonoscope was advanced under direct visualization to the cecum.  The scope was removed slowly by carefully examining the color, texture, anatomy, and integrity mucosa on the way out.  The patient was recovered in endoscopy and discharged home in satisfactory condition.    COLON FINDINGS: ILEOCECAL STENOSIS.  DISTAL ILEUM DILATED FROM 10 TO 12 MM AND UNABLE TO PASS ADULT COLONOSCOPE.  FEW ULCERS APPRECIATED AT THE IC VALVE OPENING.  SLIGHTLY VILLOUS APPEARANCE TO IC VALVE. COLD BIOPSIES OBTAINED, The colon was redundant.  Manual abdominal counter-pressure was used to reach the cecum.  The patient was moved on to their back to reach the cecum, A normal appearing cecum, ileocecal valve, and appendiceal orifice were identified. OTHERWISE NL COLON, No polyps were seen.  Multiple biopsies were performed. Small external hemorrhoids were  found.  PREP QUALITY: good.WITH IRRIGATION CECAL W/D TIME: 58 minutes COMPLICATIONS: None  ENDOSCOPIC IMPRESSION: 1.   ILEOCECAL STENOSIS/CROHN'S ILEITIS 2.   The LEFT colon IS redundant. 3.   Small external hemorrhoids  RECOMMENDATIONS: COMPLETE YOUR CT TOMORROW TO EVALUATE FOR FISTULIZING DISEASE AND ACTIVE ILEITIS.. FOLLOW A LOW RESIDUE DIET.  SEE INFO BELOW STOP SMOKING.  REMICADE Q8 WEEKS. DO NOT USE ASPIRIN, BC/GOODY POWDERS, OR IBUPROFEN, MOTRIN, ALEVE, OR NAPROXEN. TYLENOL AS NEEDED FOR ABDOMINAL PAIN. ADD A PROBIOTIC DAILY. FOLLOW UP IN 3 MOS. REFER TO [facility] GENERAL SURGERY FOR CONSIDERATION OF ILEOCECECTOMY.       _______________________________ eSignedBarney Drain, MD 07/30/2013 2:39 PM     PATIENT NAME:  Joshua Robertson, Joshua Robertson MR#: [id number]

## 2013-07-30 NOTE — Progress Notes (Signed)
Patient , mother and  girlfriend given instruction to return to xray tomorrow for ct scan. Written instruction and verbal instructions given.

## 2013-07-30 NOTE — Discharge Instructions (Signed)
You have moderately active small bowel CROHN'S DISEASE . YOU HAVE ILEAL STENOSIS. YOUR COLON AND RECTUM APPEARED NORMAL. I BIOPSIES YOUR ILEO CECAL VALVE, COLON, AND RECTUM.   COMPLETE YOUR CT TODAY.  FOLLOW A LOW RESIDUE DIET. SEE INFO BELOW  STOP SMOKING.   DO NOT USE ASPIRIN, BC/GOODY POWDERS,  OR IBUPROFEN, MOTRIN, ALEVE, OR NAPROXEN.   TYLENOL AS NEEDED FOR ABDOMINAL PAIN.   ADD A PROBIOTIC DAILY([facility], [facility], OR [facility])  FOLLOW UP IN 3 MOS.  Colonoscopy Care After Read the instructions outlined below and refer to this sheet in the next week. These discharge instructions provide you with general information on caring for yourself after you leave the hospital. While your treatment has been planned according to the most current medical practices available, unavoidable complications occasionally occur. If you have any problems or questions after discharge, call DR. Berish Bohman, [phone].  ACTIVITY  You may resume your regular activity, but move at a slower pace for the next 24 hours.   Take frequent rest periods for the next 24 hours.   Walking will help get rid of the air and reduce the bloated feeling in your belly (abdomen).   No driving for 24 hours (because of the medicine (anesthesia) used during the test).   You may shower.   Do not sign any important legal documents or operate any machinery for 24 hours (because of the anesthesia used during the test).    NUTRITION  Drink plenty of fluids.   You may resume your normal diet as instructed by your doctor.   Begin with a light meal and progress to your normal diet. Heavy or fried foods are harder to digest and may make you feel sick to your stomach (nauseated).   Avoid alcoholic beverages for 24 hours or as instructed.    MEDICATIONS  You may resume your normal medications.   WHAT YOU CAN EXPECT TODAY  Some feelings of bloating in the abdomen.   Passage of more gas than usual.   Spotting  of blood in your stool or on the toilet paper  .  IF YOU HAD POLYPS REMOVED DURING THE COLONOSCOPY:  No aspirin products for 7 days or as instructed.   Eat a soft diet IF YOU HAVE NAUSEA, BLOATING, ABDOMINAL PAIN, OR VOMITING.    FINDING OUT THE RESULTS OF YOUR TEST Not all test results are available during your visit. DR. Oneida Alar WILL CALL YOU WITHIN 7 DAYS OF YOUR PROCEDUE WITH YOUR RESULTS. Do not assume everything is normal if you have not heard from DR. Juliani Laduke IN ONE WEEK, CALL HER OFFICE AT [phone].  SEEK IMMEDIATE MEDICAL ATTENTION AND CALL THE OFFICE: [phone] IF:  You have more than a spotting of blood in your stool.   Your belly is swollen (abdominal distention).   You are nauseated or vomiting.   You have a temperature over 101F.   You have abdominal pain or discomfort that is severe or gets worse throughout the day.   Low Fiber and Residue Restricted Diet A low fiber diet restricts foods that contain carbohydrates that are not digested in the small intestine. A diet containing about 10 g of fiber is considered low fiber. The diet needs to be individualized to suit patient tolerances and preferences and to avoid unnecessary restrictions. Generally, the foods emphasized in a low fiber diet have no skins or seeds. They may have been processed to remove bran, germ, or husks. Cooking may not necessarily eliminate the fiber. Cooking  may, in fact, enable a greater quantity of fiber to be consumed in a lesser volume. Legumes and nuts are also restricted. The term low residue has also been used to describe low fiber diets, although the two are not the same. Residue refers to any substance that adds to bowel (colonic) contents, such as sloughed cells and intestinal bacteria, in addition to fiber. Residue-containing foods, prunes and prune juice, milk, and connective tissue from meats may also need to be eliminated. It is important to eliminate these foods during sudden (acute)  attacks of inflammatory bowel disease, when there is a partial obstruction due to another reason, or when minimal fecal output is desired. When these problems are gone, a more normal diet may be used.  PURPOSE  Reduce stool weight and volume.   CHOOSING FOODS Check labels, especially on foods from the starch list. Often, dietary fiber content is listed with the Nutrition Facts panel.  Breads and Starches  Allowed: White, [place], and pita breads, plain rolls, buns, or sweet rolls, doughnuts, waffles, pancakes, bagels. Plain muffins, sweet breads, biscuits, matzoth. Flour. Soda, saltine, or graham crackers. Pretzels, rusks, melba toast, zwieback. Cooked cereals: cornmeal, farina, cream cereals. Dry cereals: refined corn, wheat, rice, and oat cereals (check label). Potatoes prepared any way without skins, refined macaroni, spaghetti, noodles, refined rice.   Avoid: Bread, rolls, or crackers made with whole-wheat, multigrains, rye, bran seeds, nuts, or coconut. Corn tortillas, table-shells. Corn chips, tortilla chips. Cereals containing whole-grains, multigrains, bran, coconut, nuts, or raisins. Cooked or dry oatmeal. Coarse wheat cereals, granola. Cereals advertised as "high fiber." Potato skins. Whole-grain pasta, wild or brown rice. Popcorn.  Vegetables  Allowed:  Strained tomato and vegetable juices. Fresh: tender lettuce, cucumber, cabbage, spinach, bean sprouts. Cooked, canned: asparagus, bean sprouts, cut green or wax beans, cauliflower, pumpkin, beets, mushrooms, olives, spinach, yellow squash, tomato, tomato sauce (no seeds), zucchini (peeled), turnips. Canned sweet potatoes. Small amounts of celery, onion, radish, and green pepper may be used. Keep servings limited to  cup.   Avoid: Fresh, cooked, or canned: artichokes, baked beans, beet greens, broccoli, Brussels sprouts, French-style green beans, corn, kale, legumes, peas, sweet potatoes. Cooked: green or red cabbage, spinach. Avoid large  servings of any vegetables.  Fruit  Allowed:  All fruit juices except prune juice. Cooked or canned: apricots applesauce, cantaloupe, cherries, grapefruit, grapes, kiwi, mandarin oranges, peaches, pears, fruit cocktail, pineapple, plums, watermelon. Fresh: banana, grapes, cantaloupe, avocado, cherries, pineapple, grapefruit, kiwi, nectarines, peaches, oranges, blueberries, plums. Keep servings limited to  cup or 1 piece.   Avoid: Fresh: apple with or without skin, apricots, mango, pears, raspberries, strawberries. Prune juice, stewed or dried prunes. Dried fruits, raisins, dates. Avoid large servings of all fresh fruits.  Meat and Meat Substitutes  Allowed:  Ground or well-cooked tender beef, ham, veal, lamb, pork, or poultry. Eggs, plain cheese. Fish, oysters, shrimp, lobster, other seafood. Liver, organ meats.   Avoid: Tough, fibrous meats with gristle. Peanut butter, smooth or chunky. Cheese with seeds, nuts, or other foods not allowed. Nuts, seeds, legumes, dried peas, beans, lentils.  Milk  Allowed:  All milk products except those not allowed. Milk and milk product consumption should be minimal when low residue is desired.   Avoid: Yogurt that contains nuts or seeds.  Soups and Combination Foods  Allowed:  Bouillon, broth, or cream soups made from allowed foods. Any strained soup. Casseroles or mixed dishes made with allowed foods.   Avoid: Soups made from vegetables that are not allowed  or that contain other foods not allowed.  Desserts and Sweets  Allowed:  Plain cakes and cookies, pie made with allowed fruit, pudding, custard, cream pie. Gelatin, fruit, ice, sherbet, frozen ice pops. Ice cream, ice milk without nuts. Plain hard candy, honey, jelly, molasses, syrup, sugar, chocolate syrup, gumdrops, marshmallows.   Avoid: Desserts, cookies, or candies that contain nuts, peanut butter, or dried fruits. Jams, preserves with seeds, marmalade.  Fats and Oils  Allowed:  Margarine,  butter, cream, mayonnaise, salad oils, plain salad dressings made from allowed foods. Plain gravy, crisp bacon without rind.   Avoid: Seeds, nuts, olives. Avocados.  Beverages  Allowed:  All, except those listed to avoid.   Avoid: Fruit juices with high pulp, prune juice.  Condiments  Allowed:  Ketchup, mustard, horseradish, vinegar, cream sauce, cheese sauce, cocoa powder. Spices in moderation: allspice, basil, bay leaves, celery powder or leaves, cinnamon, cumin powder, curry powder, ginger, mace, marjoram, onion or garlic powder, oregano, paprika, parsley flakes, ground pepper, rosemary, sage, savory, tarragon, thyme, turmeric.   Avoid: Coconut, pickles.  SAMPLE MEAL PLAN The following menu is provided as a sample. Your daily menu plans will vary. Be sure to include a minimum of the following each day in order to provide essential nutrients for the adult:  Starch/Bread/Cereal Group, 6 servings.   Fruit/Vegetable Group, 5 servings.   Meat/Meat Substitute Group, 2 servings.   Milk/Milk Substitute Group, 2 servings.  A serving is equal to  cup for fruits, vegetables, and cooked cereals or 1 piece for foods such as a piece of bread, 1 orange, or 1 apple. For dry cereals and crackers, use serving sizes listed on the label. Combination foods may count as full or partial servings from various food groups. Fats, desserts, and sweets may be added to the meal plan after the requirements for essential nutrients are met. SAMPLE MENU Breakfast   cup orange juice.   1 boiled egg.   1 slice white toast.   Margarine.    cup cornflakes.   1 cup milk.   Beverage.  Lunch   cup chicken noodle soup.   2 to 3 oz sliced roast beef.   2 slices seedless rye bread.   Mayonnaise.    cup tomato juice.   1 small banana.   Beverage.  Dinner  3 oz baked chicken.    cup scalloped potatoes.    cup cooked beets.   White dinner roll.   Margarine.    cup canned peaches.    Beverage.     Lactose Free Diet Lactose is a carbohydrate that is found mainly in milk and milk products, as well as in foods with added milk or whey. Lactose must be digested by the enzyme in order to be used by the body. Lactose intolerance occurs when there is a shortage of lactase. When your body is not able to digest lactose, you may feel sick to your stomach (nausea), bloating, cramping, gas and diarrhea.  There are many dairy products that may be tolerated better than milk by some people:  The use of cultured dairy products such as yogurt, buttermilk, cottage cheese, and sweet acidophilus milk (Kefir) for lactase-deficient individuals is usually well tolerated. This is because the healthy bacteria help digest lactose.   Lactose-hydrolyzed milk (Lactaid) contains 40-90% less lactose than milk and may also be well tolerated.    SPECIAL NOTES  Lactose is a carbohydrates. The major food source is dairy products. Reading food labels is important. Many products  contain lactose even when they are not made from milk. Look for the following words: whey, milk solids, dry milk solids, nonfat dry milk powder. Typical sources of lactose other than dairy products include breads, candies, cold cuts, prepared and processed foods, and commercial sauces and gravies.   All foods must be prepared without milk, cream, or other dairy foods.   Soy milk and lactose-free supplements (LACTASE) may be used as an alternative to milk.   FOOD GROUP ALLOWED/RECOMMENDED AVOID/USE SPARINGLY  BREADS / STARCHES 4 servings or more* Breads and rolls made without milk. [place], [place], or [place] bread. Breads and rolls that contain milk. Prepared mixes such as muffins, biscuits, waffles, pancakes. Sweet rolls, donuts, [place] toast (if made with milk or lactose).  Crackers: Soda crackers, graham crackers. Any crackers prepared without lactose. Zwieback crackers, corn curls, or any that contain lactose.  Cereals:  Cooked or dry cereals prepared without lactose (read labels). Cooked or dry cereals prepared with lactose (read labels). Total, Cocoa Krispies. Special K.  Potatoes / Pasta / Rice: Any prepared without milk or lactose. Popcorn. Instant potatoes, frozen [place] fries, scalloped or au gratin potatoes.  VEGETABLES 2 servings or more Fresh, frozen, and canned vegetables. Creamed or breaded vegetables. Vegetables in a cheese sauce or with lactose-containing margarines.  FRUIT 2 servings or more All fresh, canned, or frozen fruits that are not processed with lactose. Any canned or frozen fruits processed with lactose.  MEAT & SUBSTITUTES 2 servings or more (4 to 6 oz. total per day) Plain beef, chicken, fish, [place], lamb, veal, pork, or ham. Kosher prepared meat products. Strained or junior meats that do not contain milk. Eggs, soy meat substitutes, nuts. Scrambled eggs, omelets, and souffles that contain milk. Creamed or breaded meat, fish, or fowl. Sausage products such as wieners, liver sausage, or cold cuts that contain milk solids. Cheese, cottage cheese, or cheese spreads.  MILK None. (See BEVERAGES for milk substitutes. See DESSERTS for ice cream and frozen desserts.) Milk (whole, 2%, skim, or chocolate). Evaporated, powdered, or condensed milk; malted milk.  SOUPS & COMBINATION FOODS Bouillon, broth, vegetable soups, clear soups, consomms. Homemade soups made with allowed ingredients. Combination or prepared foods that do not contain milk or milk products (read labels). Cream soups, chowders, commercially prepared soups containing lactose. Macaroni and cheese, pizza. Combination or prepared foods that contain milk or milk products.  DESSERTS & SWEETS In moderation Water and fruit ices; gelatin; angel food cake. Homemade cookies, pies, or cakes made from allowed ingredients. Pudding (if made with water or a milk substitute). Lactose-free tofu desserts. Sugar, honey, corn syrup, jam, jelly;  marmalade; molasses (beet sugar); Pure sugar candy; marshmallows. Ice cream, ice milk, sherbet, custard, pudding, frozen yogurt. Commercial cake and cookie mixes. Desserts that contain chocolate. Pie crust made with milk-containing margarine; reduced-calorie desserts made with a sugar substitute that contains lactose. Toffee, peppermint, butterscotch, chocolate, caramels.  FATS & OILS In moderation Butter (as tolerated; contains very small amounts of lactose). Margarines and dressings that do not contain milk, Vegetable oils, shortening, Miracle Whip, mayonnaise, nondairy cream & whipped toppings without lactose or milk solids added (examples: Coffee Rich, Carnation Coffeemate, Rich's Whipped Topping, PolyRich). Berniece Salines. Margarines and salad dressings containing milk; cream, cream cheese; peanut butter with added milk solids, sour cream, chip dips, made with sour cream.  BEVERAGES Carbonated drinks; tea; coffee and freeze-dried coffee; some instant coffees (check labels). Fruit drinks; fruit and vegetable juice; Rice or Soy milk. Ovaltine, hot chocolate. Some cocoas; some  instant coffees; instant iced teas; powdered fruit drinks (read labels).   CONDIMENTS / MISCELLANEOUS Soy sauce, carob powder, olives, gravy made with water, baker's cocoa, pickles, pure seasonings and spices, wine, pure monosodium glutamate, catsup, mustard. Some chewing gums, chocolate, some cocoas. Certain antibiotics and vitamin / mineral preparations. Spice blends if they contain milk products. MSG extender. Artificial sweeteners that contain lactose such as Equal (Nutra-Sweet) and Sweet 'n Low. Some nondairy creamers (read labels).   SAMPLE MENU*  Breakfast   Orange Juice.  Banana.   Bran flakes.   Nondairy Creamer.  [place]ienna Bread (toasted).   Butter or milk-free margarine.   Coffee or tea.    Noon Meal   Chicken Breast.  Rice.   Green beans.   Butter or milk-free margarine.  Fresh melon.   Coffee or  tea.    Evening Meal   Roast Beef.  Baked potato.   Butter or milk-free margarine.   Broccoli.   Lettuce salad with vinegar and oil dressing.  [employer]   Coffee or tea.

## 2013-07-31 ENCOUNTER — Ambulatory Visit (HOSPITAL_COMMUNITY): Admission: RE | Admit: 2013-07-31 | Payer: Self-pay | Source: Ambulatory Visit

## 2013-08-01 ENCOUNTER — Emergency Department (HOSPITAL_COMMUNITY)
Admission: EM | Admit: 2013-08-01 | Discharge: 2013-08-01 | Disposition: A | Discharge status: Home / Self Care | Payer: Self-pay | Attending: Emergency Medicine | Admitting: Emergency Medicine

## 2013-08-01 ENCOUNTER — Telehealth: Payer: Self-pay

## 2013-08-01 ENCOUNTER — Encounter (HOSPITAL_COMMUNITY): Payer: Self-pay | Admitting: Emergency Medicine

## 2013-08-01 DIAGNOSIS — Z880 Allergy status to penicillin: Secondary | ICD-10-CM | POA: Insufficient documentation

## 2013-08-01 DIAGNOSIS — Z79899 Other long term (current) drug therapy: Secondary | ICD-10-CM | POA: Insufficient documentation

## 2013-08-01 DIAGNOSIS — I10 Essential (primary) hypertension: Secondary | ICD-10-CM | POA: Insufficient documentation

## 2013-08-01 DIAGNOSIS — K5289 Other specified noninfective gastroenteritis and colitis: Secondary | ICD-10-CM | POA: Insufficient documentation

## 2013-08-01 DIAGNOSIS — Z8619 Personal history of other infectious and parasitic diseases: Secondary | ICD-10-CM | POA: Insufficient documentation

## 2013-08-01 DIAGNOSIS — K529 Noninfective gastroenteritis and colitis, unspecified: Secondary | ICD-10-CM

## 2013-08-01 DIAGNOSIS — F17200 Nicotine dependence, unspecified, uncomplicated: Secondary | ICD-10-CM | POA: Insufficient documentation

## 2013-08-01 DIAGNOSIS — Z8739 Personal history of other diseases of the musculoskeletal system and connective tissue: Secondary | ICD-10-CM | POA: Insufficient documentation

## 2013-08-01 DIAGNOSIS — Z8669 Personal history of other diseases of the nervous system and sense organs: Secondary | ICD-10-CM | POA: Insufficient documentation

## 2013-08-01 LAB — BASIC METABOLIC PANEL
BUN: 11 mg/dL (ref 6–23)
CO2: 28 mEq/L (ref 19–32)
Calcium: 9.8 mg/dL (ref 8.4–10.5)
Chloride: 92 mEq/L — ABNORMAL LOW (ref 96–112)
Creatinine, Ser: 1.15 mg/dL (ref 0.50–1.35)
GFR calc non Af Amer: 80 mL/min — ABNORMAL LOW (ref >90)
Glucose, Bld: 89 mg/dL (ref 70–99)
POTASSIUM: 3.6 meq/L — AB (ref 3.7–5.3)
SODIUM: 136 meq/L — AB (ref 137–147)

## 2013-08-01 LAB — CBC WITH DIFFERENTIAL/PLATELET
BASOS ABS: 0.1 10*3/uL (ref 0.0–0.1)
BASOS PCT: 1 % (ref 0–1)
EOS ABS: 0 10*3/uL (ref 0.0–0.7)
EOS PCT: 0 % (ref 0–5)
HCT: 39.3 % (ref 39.0–52.0)
Hemoglobin: 13.5 g/dL (ref 13.0–17.0)
Lymphocytes Relative: 17 % (ref 12–46)
Lymphs Abs: 1.4 10*3/uL (ref 0.7–4.0)
MCH: 25.8 pg — AB (ref 26.0–34.0)
MCHC: 34.4 g/dL (ref 30.0–36.0)
MCV: 75 fL — AB (ref 78.0–100.0)
Monocytes Absolute: 1.3 10*3/uL — ABNORMAL HIGH (ref 0.1–1.0)
Monocytes Relative: 16 % — ABNORMAL HIGH (ref 3–12)
NEUTROS PCT: 67 % (ref 43–77)
Neutro Abs: 5.6 10*3/uL (ref 1.7–7.7)
PLATELETS: 318 10*3/uL (ref 150–400)
RBC: 5.24 MIL/uL (ref 4.22–5.81)
RDW: 14.8 % (ref 11.5–15.5)
WBC: 8.3 10*3/uL (ref 4.0–10.5)

## 2013-08-01 LAB — URINALYSIS, ROUTINE W REFLEX MICROSCOPIC
BILIRUBIN URINE: NEGATIVE
Glucose, UA: NEGATIVE mg/dL
HGB URINE DIPSTICK: NEGATIVE
KETONES UR: 15 mg/dL — AB
Leukocytes, UA: NEGATIVE
NITRITE: NEGATIVE
PROTEIN: NEGATIVE mg/dL
Specific Gravity, Urine: 1.01 (ref 1.005–1.030)
UROBILINOGEN UA: 0.2 mg/dL (ref 0.0–1.0)
pH: 6.5 (ref 5.0–8.0)

## 2013-08-01 MED ORDER — ONDANSETRON HCL 4 MG/2ML IJ SOLN
4.0000 mg | Freq: Once | INTRAMUSCULAR | Status: AC
  Administered 2013-08-01: 4 mg via INTRAVENOUS
  Filled 2013-08-01: qty 2

## 2013-08-01 MED ORDER — SODIUM CHLORIDE 0.9 % IV BOLUS (SEPSIS)
1000.0000 mL | Freq: Once | INTRAVENOUS | Status: AC
  Administered 2013-08-01: 1000 mL via INTRAVENOUS

## 2013-08-01 MED ORDER — PROMETHAZINE HCL 25 MG PO TABS: 25.0000 mg | ORAL_TABLET | Freq: Four times a day (QID) | ORAL | Status: DC | PRN

## 2013-08-01 NOTE — Discharge Instructions (Signed)
Medication for nausea. Clear liquids. Followup your primary care Dr.

## 2013-08-01 NOTE — ED Notes (Signed)
Pt reports has crohn's disease and had colonoscopy 2 days ago.  C/O n/v/d, generalized body aches, fever, and chills since then.

## 2013-08-01 NOTE — Telephone Encounter (Signed)
Called and informed pt's mom.

## 2013-08-01 NOTE — ED Notes (Signed)
Pt taking PO with no vomiting

## 2013-08-01 NOTE — ED Notes (Signed)
Pt ambulated to bathroom with no difficulty

## 2013-08-01 NOTE — ED Provider Notes (Signed)
CSN: [id number]     Arrival date & time 08/01/13  1511 History    This chart was scribed for Nat Christen, MD by Era Bumpers, ED scribe. This patient was seen in room APA01/APA01 and the patient's care was started at 1511.  Chief Complaint  Patient presents with  . Emesis  . Diarrhea   HPI HPI Comments: Joshua Robertson is a 38 y.o. male who presents to the Emergency Department w/hx of chron's complaining of nausea, vomiting, diarrhea, and myalgias, onset 2 days ago. He reports this problem does not feel like previous flare ups of his Chron's disease. He gets Remicade infusions for his Crohn's. He denies sick contacts at home.  Decreased oral intake. Feels dehydrated. Severity is mild to moderate. His PCP is Dr. Darleene Cleaver at [facility]  Past Medical History  Diagnosis Date  . Helicobacter pylori gastritis 2009  . Spondyloarthropathy 2009 EROSIVE  . Sacroiliitis 2009  . Migraine headache     NONE SINCE AGE 89  . BMI between 19-24,adult JAN 2010 149 LBS  . Crohn's disease AUG 2009 NUR [facility]    HB 9.4 FERRITIN 26  140 LBS; no serologies-pt can't afford test  . Hypertension    Past Surgical History  Procedure Laterality Date  . Colonoscopy     Family History  Problem Relation Age of Onset  . Colon cancer Neg Hx   . Colon polyps Neg Hx   . Inflammatory bowel disease Neg Hx   . Hypertension Mother   . Hypertension Father    History  Substance Use Topics  . Smoking status: Current Every Day Smoker -- 0.50 packs/day    Types: Cigarettes  . Smokeless tobacco: Not on file  . Alcohol Use: No    Review of Systems    Allergies  Azathioprine; Penicillins; and Pentasa  Home Medications   Current Outpatient Rx  Name  Route  Sig  Dispense  Refill  . benazepril (LOTENSIN) 40 MG tablet   Oral   Take 1 tablet (40 mg total) by mouth daily.   30 tablet   2   . cloNIDine (CATAPRES) 0.1 MG tablet   Oral   Take 1 tablet (0.1 mg total) by mouth 2 (two) times daily.   60  tablet   11   . Dextromethorphan-Guaifenesin (MUCINEX DM MAXIMUM STRENGTH) 60-1200 MG TB12   Oral   Take 1 tablet by mouth daily as needed (FOR COLD SYMPTOMS).         . hydrochlorothiazide (MICROZIDE) 12.5 MG capsule   Oral   Take 12.5 mg by mouth daily.         Marland Kitchen inFLIXimab (REMICADE) 100 MG injection   Intravenous   Inject 100 mg into the vein every 8 (eight) weeks.   1 each   5   . promethazine (PHENERGAN) 25 MG tablet   Oral   Take 1 tablet (25 mg total) by mouth every 6 (six) hours as needed for nausea or vomiting.   20 tablet   0    BP 137/80  Pulse 80  Temp(Src) 98.9 F (37.2 C) (Oral)  Resp 12  Ht 5' 10"  (1.778 m)  Wt 155 lb (70.308 kg)  BMI 22.24 kg/m2  SpO2 100% Physical Exam  Nursing note and vitals reviewed. Constitutional: He is oriented to person, place, and time. He appears well-developed and well-nourished.  HENT:  Head: Normocephalic and atraumatic.  Dry mucous membranes  Eyes: Conjunctivae and EOM are normal. Pupils are equal, round,  and reactive to light.  Neck: Normal range of motion. Neck supple.  Cardiovascular: Normal rate, regular rhythm and normal heart sounds.   Pulmonary/Chest: Effort normal and breath sounds normal.  Abdominal: Soft. Bowel sounds are normal.  Musculoskeletal: Normal range of motion.  Neurological: He is alert and oriented to person, place, and time.  Skin: Skin is warm and dry.  Psychiatric: He has a normal mood and affect. His behavior is normal.    ED Course  Procedures (including critical care time) Labs Review Labs Reviewed  URINALYSIS, ROUTINE W REFLEX MICROSCOPIC - Abnormal; Notable for the following:    Ketones, ur 15 (*)    All other components within normal limits  CBC WITH DIFFERENTIAL - Abnormal; Notable for the following:    MCV 75.0 (*)    MCH 25.8 (*)    Monocytes Relative 16 (*)    Monocytes Absolute 1.3 (*)    All other components within normal limits  BASIC METABOLIC PANEL - Abnormal;  Notable for the following:    Sodium 136 (*)    Potassium 3.6 (*)    Chloride 92 (*)    GFR calc non Af Amer 80 (*)    All other components within normal limits   Imaging Review No results found.  EKG Interpretation   None       MDM   Final diagnoses:  Gastroenteritis  patient feels much better after IV fluids.   No acute abdomen.  D/C meds phenergan  I personally performed the services described in this documentation, which was scribed in my presence. The recorded information has been reviewed and is accurate.     Nat Christen, MD 08/01/13 2020

## 2013-08-01 NOTE — Telephone Encounter (Signed)
Pt's mom call and said that pt has been sick every since he had TCS on 07/30/2013. He has been having chills. Fever of 101 since yesterday. Vomiting. She wants to know if he should go to the ED. Michela Pitcher he can hardly keep anything down, he is getting some fluids but she is not sure if it is enough. She wants to know can Dr. Oneida Alar see him if he goes to the ED. Please advise!

## 2013-08-01 NOTE — Telephone Encounter (Signed)
PLEASE CALL PT's mother. HE SHOULD GO TO THE ED. SHE SHOULD LET THEM KNOW HE CAN'T KEEP ANYTHING DOWN.

## 2013-08-02 ENCOUNTER — Encounter (HOSPITAL_COMMUNITY): Payer: Self-pay | Admitting: Gastroenterology

## 2013-08-06 ENCOUNTER — Encounter (HOSPITAL_COMMUNITY)
Admission: RE | Admit: 2013-08-06 | Discharge: 2013-08-06 | Disposition: A | Discharge status: Home / Self Care | Payer: Self-pay | Source: Ambulatory Visit | Attending: Gastroenterology | Admitting: Gastroenterology

## 2013-08-06 MED ORDER — SODIUM CHLORIDE 0.9 % IV SOLN
INTRAVENOUS | Status: DC
  Administered 2013-08-06: 10:00:00 via INTRAVENOUS

## 2013-08-06 MED ORDER — SODIUM CHLORIDE 0.9 % IV SOLN
400.0000 mg | INTRAVENOUS | Status: DC
  Administered 2013-08-06: 400 mg via INTRAVENOUS
  Filled 2013-08-06: qty 40

## 2013-08-08 ENCOUNTER — Ambulatory Visit (HOSPITAL_COMMUNITY): Payer: Self-pay

## 2013-08-08 ENCOUNTER — Ambulatory Visit (HOSPITAL_COMMUNITY)
Admission: RE | Admit: 2013-08-08 | Discharge: 2013-08-08 | Disposition: A | Discharge status: Home / Self Care | Payer: Self-pay | Source: Ambulatory Visit | Attending: Gastroenterology | Admitting: Gastroenterology

## 2013-08-08 DIAGNOSIS — R197 Diarrhea, unspecified: Secondary | ICD-10-CM | POA: Insufficient documentation

## 2013-08-08 DIAGNOSIS — R933 Abnormal findings on diagnostic imaging of other parts of digestive tract: Secondary | ICD-10-CM | POA: Insufficient documentation

## 2013-08-08 DIAGNOSIS — K6289 Other specified diseases of anus and rectum: Secondary | ICD-10-CM | POA: Insufficient documentation

## 2013-08-08 DIAGNOSIS — K5090 Crohn's disease, unspecified, without complications: Secondary | ICD-10-CM | POA: Insufficient documentation

## 2013-08-08 MED ORDER — IOHEXOL 300 MG/ML  SOLN
100.0000 mL | Freq: Once | INTRAMUSCULAR | Status: AC | PRN
  Administered 2013-08-08: 100 mL via INTRAVENOUS

## 2013-08-08 NOTE — Progress Notes (Signed)
Quick Note:  Pt is aware and will await info from Dr. Oneida Alar. ______

## 2013-08-13 ENCOUNTER — Encounter: Payer: Self-pay | Admitting: Gastroenterology

## 2013-08-13 NOTE — Progress Notes (Addendum)
PLEASE CALL PT. His biopsy show continued inflammation in his small bowel. HE SHOULD STOP SMOKING. IT MAKES HIS CROHN'S DIFFICULT TO GET UNDER CONTROL. Continue Remicade every 6 weeks. See surgery at [place]. I WILL WORK ON PT ASSISTANCE FOR Humira. [facility] IN 2 MOS E30 SLF DX: CROHN'S DISEASE.

## 2013-08-13 NOTE — Progress Notes (Signed)
Pt returned call and was informed. Patient assistance forms mailed to pt to complete for the Humira.  Routing to Darius Bump for the referral.

## 2013-08-13 NOTE — Progress Notes (Signed)
LMOM to call.

## 2013-08-13 NOTE — Progress Notes (Signed)
Reminder in [facility]

## 2013-08-13 NOTE — Progress Notes (Signed)
Monday March 9th at 10:00 at [facility] in the [facility] and Joshua Robertson is aware

## 2013-08-13 NOTE — Progress Notes (Signed)
Reminder in epic °

## 2013-08-15 NOTE — Progress Notes (Signed)
REVIEWED.  

## 2013-08-23 ENCOUNTER — Telehealth: Payer: Self-pay | Admitting: Gastroenterology

## 2013-08-23 NOTE — Telephone Encounter (Signed)
Patient came by office earlier today and brought patient assistance papers back to [facility] and also said that he would need a letter for disability (Im unsure of what he is asking for) and said he would need it by April 6th. Please advise [phone] or [phone]

## 2013-08-31 NOTE — Telephone Encounter (Addendum)
CALLED PT. HAD APPT WITH [place]. SURGERY ON HOLD. AGREE WITH CHANGING TO HUMIRA. PT WILL PICK UP LETTER TUES.

## 2013-08-31 NOTE — Telephone Encounter (Signed)
I tried to call pt. LMOM to call. I called the other number and spoke to his mom. She said that Dr. Nona Dell is working on a letter in reference to his condition with crohn's, to help him get disability. She said Dr. Oneida Alar had discussed with him, but he will just need it by 09/17/2013 as he requested.

## 2013-09-01 ENCOUNTER — Encounter (HOSPITAL_COMMUNITY): Payer: Self-pay | Admitting: Emergency Medicine

## 2013-09-01 ENCOUNTER — Emergency Department (HOSPITAL_COMMUNITY)
Admission: EM | Admit: 2013-09-01 | Discharge: 2013-09-01 | Disposition: A | Discharge status: Home / Self Care | Payer: Self-pay | Attending: Emergency Medicine | Admitting: Emergency Medicine

## 2013-09-01 DIAGNOSIS — Z8739 Personal history of other diseases of the musculoskeletal system and connective tissue: Secondary | ICD-10-CM | POA: Insufficient documentation

## 2013-09-01 DIAGNOSIS — R109 Unspecified abdominal pain: Secondary | ICD-10-CM

## 2013-09-01 DIAGNOSIS — F17200 Nicotine dependence, unspecified, uncomplicated: Secondary | ICD-10-CM | POA: Insufficient documentation

## 2013-09-01 DIAGNOSIS — R112 Nausea with vomiting, unspecified: Secondary | ICD-10-CM | POA: Insufficient documentation

## 2013-09-01 DIAGNOSIS — I10 Essential (primary) hypertension: Secondary | ICD-10-CM | POA: Insufficient documentation

## 2013-09-01 DIAGNOSIS — K5090 Crohn's disease, unspecified, without complications: Secondary | ICD-10-CM | POA: Insufficient documentation

## 2013-09-01 DIAGNOSIS — Z79899 Other long term (current) drug therapy: Secondary | ICD-10-CM | POA: Insufficient documentation

## 2013-09-01 DIAGNOSIS — G43909 Migraine, unspecified, not intractable, without status migrainosus: Secondary | ICD-10-CM | POA: Insufficient documentation

## 2013-09-01 DIAGNOSIS — Z8619 Personal history of other infectious and parasitic diseases: Secondary | ICD-10-CM | POA: Insufficient documentation

## 2013-09-01 DIAGNOSIS — Z880 Allergy status to penicillin: Secondary | ICD-10-CM | POA: Insufficient documentation

## 2013-09-01 DIAGNOSIS — E876 Hypokalemia: Secondary | ICD-10-CM | POA: Insufficient documentation

## 2013-09-01 LAB — COMPREHENSIVE METABOLIC PANEL
ALBUMIN: 4.4 g/dL (ref 3.5–5.2)
ALT: 12 U/L (ref 0–53)
AST: 22 U/L (ref 0–37)
Alkaline Phosphatase: 71 U/L (ref 39–117)
BUN: 12 mg/dL (ref 6–23)
CO2: 28 mEq/L (ref 19–32)
Calcium: 10.1 mg/dL (ref 8.4–10.5)
Chloride: 96 mEq/L (ref 96–112)
Creatinine, Ser: 1.02 mg/dL (ref 0.50–1.35)
GFR calc Af Amer: >90 mL/min (ref >90)
GFR calc non Af Amer: >90 mL/min (ref >90)
Glucose, Bld: 122 mg/dL — ABNORMAL HIGH (ref 70–99)
Potassium: 3.2 mEq/L — ABNORMAL LOW (ref 3.7–5.3)
SODIUM: 139 meq/L (ref 137–147)
TOTAL PROTEIN: 8.9 g/dL — AB (ref 6.0–8.3)
Total Bilirubin: 0.5 mg/dL (ref 0.3–1.2)

## 2013-09-01 LAB — CBC WITH DIFFERENTIAL/PLATELET
BASOS PCT: 0 % (ref 0–1)
Basophils Absolute: 0 10*3/uL (ref 0.0–0.1)
EOS ABS: 0.2 10*3/uL (ref 0.0–0.7)
Eosinophils Relative: 1 % (ref 0–5)
HCT: 37.2 % — ABNORMAL LOW (ref 39.0–52.0)
HEMOGLOBIN: 12.7 g/dL — AB (ref 13.0–17.0)
LYMPHS ABS: 2 10*3/uL (ref 0.7–4.0)
Lymphocytes Relative: 17 % (ref 12–46)
MCH: 25.8 pg — AB (ref 26.0–34.0)
MCHC: 34.1 g/dL (ref 30.0–36.0)
MCV: 75.5 fL — ABNORMAL LOW (ref 78.0–100.0)
MONOS PCT: 3 % (ref 3–12)
Monocytes Absolute: 0.4 10*3/uL (ref 0.1–1.0)
NEUTROS PCT: 78 % — AB (ref 43–77)
Neutro Abs: 8.9 10*3/uL — ABNORMAL HIGH (ref 1.7–7.7)
PLATELETS: 284 10*3/uL (ref 150–400)
RBC: 4.93 MIL/uL (ref 4.22–5.81)
RDW: 14.8 % (ref 11.5–15.5)
WBC: 11.4 10*3/uL — ABNORMAL HIGH (ref 4.0–10.5)

## 2013-09-01 LAB — LIPASE, BLOOD: Lipase: 22 U/L (ref 11–59)

## 2013-09-01 LAB — I-STAT CG4 LACTIC ACID, ED: Lactic Acid, Venous: 1.42 mmol/L (ref 0.5–2.2)

## 2013-09-01 MED ORDER — ONDANSETRON 8 MG PO TBDP
8.0000 mg | ORAL_TABLET | Freq: Once | ORAL | Status: AC
  Administered 2013-09-01: 8 mg via ORAL
  Filled 2013-09-01: qty 1

## 2013-09-01 MED ORDER — OXYCODONE-ACETAMINOPHEN 5-325 MG PO TABS: 1.0000 | ORAL_TABLET | ORAL | Status: DC | PRN

## 2013-09-01 MED ORDER — ONDANSETRON HCL 4 MG/2ML IJ SOLN
4.0000 mg | Freq: Once | INTRAMUSCULAR | Status: AC
  Administered 2013-09-01 (×2): 4 mg via INTRAVENOUS
  Filled 2013-09-01: qty 2

## 2013-09-01 MED ORDER — POTASSIUM CHLORIDE CRYS ER 20 MEQ PO TBCR
40.0000 meq | EXTENDED_RELEASE_TABLET | Freq: Once | ORAL | Status: AC
  Administered 2013-09-01: 40 meq via ORAL
  Filled 2013-09-01: qty 2

## 2013-09-01 MED ORDER — ONDANSETRON HCL 4 MG/2ML IJ SOLN
INTRAMUSCULAR | Status: AC
  Administered 2013-09-01: 4 mg via INTRAVENOUS
  Filled 2013-09-01: qty 2

## 2013-09-01 MED ORDER — ONDANSETRON HCL 4 MG/2ML IJ SOLN: 4.0000 mg | Freq: Once | INTRAMUSCULAR | Status: AC

## 2013-09-01 MED ORDER — ONDANSETRON HCL 8 MG PO TABS: 8.0000 mg | ORAL_TABLET | Freq: Three times a day (TID) | ORAL | Status: DC | PRN

## 2013-09-01 MED ORDER — SODIUM CHLORIDE 0.9 % IV BOLUS (SEPSIS)
1000.0000 mL | Freq: Once | INTRAVENOUS | Status: AC
  Administered 2013-09-01: 1000 mL via INTRAVENOUS

## 2013-09-01 MED ORDER — SODIUM CHLORIDE 0.9 % IV SOLN
Freq: Once | INTRAVENOUS | Status: AC
Start: 2013-09-01 — End: 2013-09-01
  Administered 2013-09-01: 04:00:00 via INTRAVENOUS

## 2013-09-01 MED ORDER — HYDROMORPHONE HCL PF 1 MG/ML IJ SOLN
1.0000 mg | Freq: Once | INTRAMUSCULAR | Status: AC
  Administered 2013-09-01: 1 mg via INTRAVENOUS
  Filled 2013-09-01: qty 1

## 2013-09-01 NOTE — Discharge Instructions (Signed)
Abdominal (belly) pain can be caused by many things. any cases can be observed and treated at home after initial evaluation in the emergency department. Even though you are being discharged home, abdominal pain can be unpredictable. Therefore, you need a repeated exam if your pain does not resolve, returns, or worsens. Most patients with abdominal pain don't have to be admitted to the hospital or have surgery, but serious problems like appendicitis and gallbladder attacks can start out as nonspecific pain. Many abdominal conditions cannot be diagnosed in one visit, so follow-up evaluations are very important. SEEK IMMEDIATE MEDICAL ATTENTION IF: The pain does not go away or becomes severe, particularly over the next 8-12 hours.  A temperature above 100.52F develops.  Repeated vomiting occurs (multiple episodes).  The pain becomes localized to portions of the abdomen. The right side could possibly be appendicitis. In an adult, the left lower portion of the abdomen could be colitis or diverticulitis.  Blood is being passed in stools or vomit (bright red or black tarry stools).  Return also if you develop chest pain, difficulty breathing, dizziness or fainting, or become confused, poorly responsive, or inconsolable.

## 2013-09-01 NOTE — ED Provider Notes (Signed)
CSN: [id number]     Arrival date & time 09/01/13  0222 History   First MD Initiated Contact with Patient 09/01/13 0232     Chief Complaint  Patient presents with  . Emesis      Patient is a 38 y.o. male presenting with vomiting. The history is provided by the patient.  Emesis Severity:  Moderate Duration:  7 hours Timing:  Intermittent Progression:  Worsening Chronicity:  New Relieved by:  Nothing Worsened by:  Liquids Associated symptoms: abdominal pain and chills   Associated symptoms: no cough, no diarrhea and no fever   pt presents for abdominal pain and vomiting He reports he started to develop vomiting about 7 hrs ago and then the abdominal pain occurred He has h/o Crohn's Disease and this pain is above his usual abdominal pain  No blood in vomitus He denies diarrhea and denies bloody stool.  He reports stool output is at baseline  No h/o abdominal surgeries He does not take prednisone daily He takes remicade injections, but he is unsure of last injection   Past Medical History  Diagnosis Date  . Helicobacter pylori gastritis 2009  . Spondyloarthropathy 2009 EROSIVE  . Sacroiliitis 2009  . Migraine headache     NONE SINCE AGE 67  . BMI between 19-24,adult JAN 2010 149 LBS  . Crohn's disease AUG 2009 NUR [place]    HB 9.4 FERRITIN 26  140 LBS; no serologies-pt can't afford test  . Hypertension    Past Surgical History  Procedure Laterality Date  . Colonoscopy    . Colonoscopy N/A 07/30/2013    stenotic IC VALVE. UNABLE TO INTUBATE ILEUM.   Family History  Problem Relation Age of Onset  . Colon cancer Neg Hx   . Colon polyps Neg Hx   . Inflammatory bowel disease Neg Hx   . Hypertension Mother   . Hypertension Father    History  Substance Use Topics  . Smoking status: Current Every Day Smoker -- 0.50 packs/day    Types: Cigarettes  . Smokeless tobacco: Not on file  . Alcohol Use: No    Review of Systems  Constitutional: Positive for chills. Negative  for fever.  Respiratory: Negative for shortness of breath.   Cardiovascular: Negative for chest pain.  Gastrointestinal: Positive for nausea, vomiting and abdominal pain. Negative for diarrhea and blood in stool.  Genitourinary: Negative for dysuria.  Neurological: Positive for weakness.  All other systems reviewed and are negative.      Allergies  Azathioprine; Penicillins; and Pentasa  Home Medications   Current Outpatient Rx  Name  Route  Sig  Dispense  Refill  . benazepril (LOTENSIN) 40 MG tablet   Oral   Take 1 tablet (40 mg total) by mouth daily.   30 tablet   2   . cloNIDine (CATAPRES) 0.1 MG tablet   Oral   Take 1 tablet (0.1 mg total) by mouth 2 (two) times daily.   60 tablet   11   . hydrochlorothiazide (MICROZIDE) 12.5 MG capsule   Oral   Take 12.5 mg by mouth daily.         Marland Kitchen inFLIXimab (REMICADE) 100 MG injection   Intravenous   Inject 100 mg into the vein every 8 (eight) weeks.   1 each   5   . Dextromethorphan-Guaifenesin (MUCINEX DM MAXIMUM STRENGTH) 60-1200 MG TB12   Oral   Take 1 tablet by mouth daily as needed (FOR COLD SYMPTOMS).         Marland Kitchen  promethazine (PHENERGAN) 25 MG tablet   Oral   Take 1 tablet (25 mg total) by mouth every 6 (six) hours as needed for nausea or vomiting.   20 tablet   0    BP 156/84  Pulse 52  Temp(Src) 98.3 F (36.8 C) (Oral)  Resp 16  Ht 5' 9.5" (1.765 m)  Wt 145 lb (65.772 kg)  BMI 21.11 kg/m2  SpO2 100% Physical Exam CONSTITUTIONAL: Well developed/well nourished, uncomfortable appearing HEAD: Normocephalic/atraumatic EYES: EOMI/PERRL ENMT: Mucous membranes moist NECK: supple no meningeal signs SPINE:entire spine nontender CV: S1/S2 noted, no murmurs/rubs/gallops noted LUNGS: Lungs are clear to auscultation bilaterally, no apparent distress ABDOMEN: soft, diffuse moderate tenderness, no rebound is noted.  +BS noted GU:no cva tenderness NEURO: Pt is awake/alert, moves all extremitiesx4 EXTREMITIES:  pulses normal, full ROM SKIN: warm, color normal PSYCH: no abnormalities of mood noted  ED Course  Procedures   3:51 AM Pt feels improved He reports his abd pain is improved.  He feels most of his pain is "soreness" from vomiting He would like to defer CT imaging for now.  Will give another IV fluid bolus with zofran and reassess   Pt felt dramatically improved He is taking PO His abdomen is soft without any focal tenderness Aside from elevated WBC and mild hypoKalemia, labs reassuring.  Lactate normal He wishes to go home and does not want further testing Pt is very reasonable and appropriate, and feels pain is now at his usual level of pain and not different from previous episodes We discussed strict return precautions.  He is agreeable with plan  Labs Review Labs Reviewed  CBC WITH DIFFERENTIAL - Abnormal; Notable for the following:    WBC 11.4 (*)    Hemoglobin 12.7 (*)    HCT 37.2 (*)    MCV 75.5 (*)    MCH 25.8 (*)    Neutrophils Relative % 78 (*)    Neutro Abs 8.9 (*)    All other components within normal limits  COMPREHENSIVE METABOLIC PANEL - Abnormal; Notable for the following:    Potassium 3.2 (*)    Glucose, Bld 122 (*)    Total Protein 8.9 (*)    All other components within normal limits  LIPASE, BLOOD  I-STAT CG4 LACTIC ACID, ED     MDM   Final diagnoses:  Abdominal pain  Crohn's disease  Nausea and vomiting  Hypokalemia    Nursing notes including past medical history and social history reviewed and considered in documentation Labs/vital reviewed and considered Previous records reviewed and considered     Sharyon Cable, MD 09/01/13 [phone]

## 2013-09-01 NOTE — ED Notes (Signed)
Patient states that he has been vomiting since 5 p.m. Not able to keep anything down. Feels like he is going to pass out.

## 2013-09-03 ENCOUNTER — Encounter: Payer: Self-pay | Admitting: General Practice

## 2013-09-03 NOTE — Telephone Encounter (Signed)
LETTER DRAFTED. PT MAY PICK UP MAR 24 AROUND 12 NOON.

## 2013-09-03 NOTE — Telephone Encounter (Signed)
Letter printed on letterhead and placed at the front desk for pick up.

## 2013-09-17 ENCOUNTER — Encounter (HOSPITAL_COMMUNITY): Admission: RE | Admit: 2013-09-17 | Payer: Self-pay | Source: Ambulatory Visit

## 2013-09-17 ENCOUNTER — Telehealth: Payer: Self-pay | Admitting: *Deleted

## 2013-09-17 NOTE — Telephone Encounter (Signed)
Pt's mother called saying they have not received a letter for pt's ss pt has to go tomorrow for ss. Please Advise [phone]

## 2013-09-17 NOTE — Telephone Encounter (Signed)
Pt's mother is aware that pt's letter is at the front desk to come by and pick it up, Per mother she will let pt know.

## 2013-09-17 NOTE — Telephone Encounter (Signed)
Please call the patient and let her know that the letter is ready for pick up and has been since last week

## 2013-10-04 ENCOUNTER — Telehealth: Payer: Self-pay | Admitting: *Deleted

## 2013-10-04 NOTE — Telephone Encounter (Signed)
Pt called stating Dr. Oneida Alar wanted him to let her know he got his humira, pt states he got it today.

## 2013-10-04 NOTE — Telephone Encounter (Signed)
REVIEWED.  

## 2013-10-04 NOTE — Telephone Encounter (Signed)
Routing to Dr. Oneida Alar and Almyra Free.

## 2013-10-04 NOTE — Telephone Encounter (Signed)
noted 

## 2013-10-05 NOTE — Telephone Encounter (Signed)
LMOM that the instructions are very clear with the Humira. However, if he feels he needs more understanding, please call and we can give him a time that would be convenient to come by so we can explain it to him.

## 2013-12-03 ENCOUNTER — Telehealth: Payer: Self-pay | Admitting: *Deleted

## 2013-12-03 DIAGNOSIS — K50012 Crohn's disease of small intestine with intestinal obstruction: Secondary | ICD-10-CM

## 2013-12-03 NOTE — Telephone Encounter (Signed)
I spoke with Dr. Oneida Alar and she said she does not have any quick answers. It his abdominal pain is that bad, then he needs to go to the ED. I spoke to pt's friend, Maree Erie and also Mariann Laster, his mom.

## 2013-12-03 NOTE — Telephone Encounter (Signed)
Pt's mother called stating that pt has been in a lot of abd pain and vomiting since pt has started the humara he has also had weight loss. Please advise wanda [phone] or [place] [phone]

## 2013-12-03 NOTE — Telephone Encounter (Signed)
I called and spoke to pt's mom, Mariann Laster. She said he has complained with abdominal pain ever since he started the Humira.  Michela Pitcher he has lost weight also, but they do not know how much.  Today he is having the abdominal pain and nausea/vomiting and diarrhea.  He feels that he cannot continue the Humira.  Please advise!

## 2013-12-04 ENCOUNTER — Other Ambulatory Visit: Payer: Self-pay | Admitting: Gastroenterology

## 2013-12-04 DIAGNOSIS — K50012 Crohn's disease of small intestine with intestinal obstruction: Secondary | ICD-10-CM

## 2013-12-04 DIAGNOSIS — R112 Nausea with vomiting, unspecified: Secondary | ICD-10-CM

## 2013-12-04 DIAGNOSIS — R109 Unspecified abdominal pain: Secondary | ICD-10-CM

## 2013-12-04 NOTE — Telephone Encounter (Signed)
REVIEWED.  

## 2013-12-04 NOTE — Telephone Encounter (Signed)
SBFT is scheduled for Wednesday June 24th at 8:30 and patient is aware

## 2013-12-04 NOTE — Telephone Encounter (Signed)
I called and told pt's mom the written prescription for the Vicodin is at the front desk here for the pt to pick up.

## 2013-12-04 NOTE — Assessment & Plan Note (Signed)
SBFT PREDNISONE

## 2013-12-04 NOTE — Addendum Note (Signed)
Addended by: [name] on: 12/04/2013 09:28 AM   Modules accepted: Orders

## 2013-12-04 NOTE — Telephone Encounter (Signed)
JUN 22: CALLED PT. NEEDS PREDNISONE AND PAIN MEDS. SBFT TUE OR WED-DX: CROHN'S ILEITIS WITH STRICTURE, ABDOMINAL PAIN, VOMITING. I CALLED IN PREDNISONE, BUT UNABLE TO CALL IN HYDROCODONE. ULTRAM 50 MG #10 CALLED IN.   CALL PT WITH SBFT TIME AND HE CAN PICK UP RX FOR HYDROCODONE.

## 2013-12-05 ENCOUNTER — Ambulatory Visit (HOSPITAL_COMMUNITY)
Admission: RE | Admit: 2013-12-05 | Discharge: 2013-12-05 | Disposition: A | Discharge status: Home / Self Care | Payer: Self-pay | Source: Ambulatory Visit | Attending: Gastroenterology | Admitting: Gastroenterology

## 2013-12-05 DIAGNOSIS — K50012 Crohn's disease of small intestine with intestinal obstruction: Secondary | ICD-10-CM

## 2013-12-05 DIAGNOSIS — K5000 Crohn's disease of small intestine without complications: Secondary | ICD-10-CM | POA: Insufficient documentation

## 2013-12-07 ENCOUNTER — Encounter: Payer: Self-pay | Admitting: Gastroenterology

## 2013-12-07 ENCOUNTER — Ambulatory Visit (INDEPENDENT_AMBULATORY_CARE_PROVIDER_SITE_OTHER): Payer: Self-pay | Admitting: Gastroenterology

## 2013-12-07 VITALS — BP 100/65 | HR 76 | Temp 97.3°F | Ht 69.5 in | Wt 139.0 lb

## 2013-12-07 DIAGNOSIS — K50018 Crohn's disease of small intestine with other complication: Secondary | ICD-10-CM

## 2013-12-07 DIAGNOSIS — R634 Abnormal weight loss: Secondary | ICD-10-CM

## 2013-12-07 DIAGNOSIS — K5000 Crohn's disease of small intestine without complications: Secondary | ICD-10-CM

## 2013-12-07 NOTE — Assessment & Plan Note (Addendum)
Crohn's disease of the distal small bowel with documented stricture, worse on current SBFT then on CT in 07/2013. No improvement on Humira, actually patient feels worse. He quit smoking one week ago. Complains of daily pain, loose stool, nausea, ongoing weight loss. Patient against surgery and requested to go back on Remicade.   Advised to pick up his prednisone today. I will discuss with Dr. Oneida Alar regarding change in long-term medication. ?increase Remicade to 20m/kg dosing?

## 2013-12-07 NOTE — Progress Notes (Signed)
Primary Care Physician:  Raiford Simmonds., PA-C  Primary Gastroenterologist:  Barney Drain, MD   Chief Complaint  Patient presents with  . Follow-up    humira not working  . Crohn's Disease    HPI:  Joshua Robertson is a 38 y.o. male here for further evaluation of his Crohn's disease. Colonoscopy in February revealed a stenotic ICV, terminal ileum could not be intubated. CT scan showed mucosal thickening of the distal 7 cm of the terminal ileum. He was transitioned from Remicade to Humira and has subsequently been referred to [facility] for consideration of surgery. Currently patient is refusing surgery. At first he wanted to give Humira chance to work but he's been on it 3 months now with no improvement and actually feels like his symptoms are worse than when he was on Remicade. He wants to go back on Remicade. Previously Remicade worked until 5-6 weeks into treatment.  One Humira left, due next Friday. Pain every day, whole stomach. No constipation. Stools soft to runny, 3-4 per day but may have 1-2 days with no BM. No blood in stool. No fever. Some vomiting this weight. PP stomach pain. One week without cigarette. Called and spoke with Dr. Oneida Alar a few days ago, prednisone prescribed patient has been unable to afford the medication. He plans to get it today. He also had a small bowel follow-through with details as outlined below.   Current Outpatient Prescriptions  Medication Sig Dispense Refill  . Adalimumab (HUMIRA [place]) Inject into the skin every 14 (fourteen) days.      . benazepril (LOTENSIN) 40 MG tablet Take 1 tablet (40 mg total) by mouth daily.  30 tablet  2  . cloNIDine (CATAPRES) 0.1 MG tablet Take 1 tablet (0.1 mg total) by mouth 2 (two) times daily.  60 tablet  11  . hydrochlorothiazide (MICROZIDE) 12.5 MG capsule Take 12.5 mg by mouth daily.       No current facility-administered medications for this visit.    Allergies as of 12/07/2013 - Review Complete 12/07/2013  Allergen  Reaction Noted  . Azathioprine Nausea And Vomiting 11/25/2009  . Penicillins Swelling and Rash 08/15/2008  . Pentasa [mesalamine er] Rash 07/11/2013    Past Medical History  Diagnosis Date  . Helicobacter pylori gastritis 2009  . Spondyloarthropathy 2009 EROSIVE  . Sacroiliitis 2009  . Migraine headache     NONE SINCE AGE 39  . BMI between 19-24,adult JAN 2010 149 LBS  . Crohn's disease AUG 2009 NUR [place]    HB 9.4 FERRITIN 26  140 LBS; no serologies-pt can't afford test  . Hypertension     Past Surgical History  Procedure Laterality Date  . Colonoscopy    . Colonoscopy N/A 07/30/2013    stenotic IC VALVE. UNABLE TO INTUBATE ILEUM.    Family History  Problem Relation Age of Onset  . Colon cancer Neg Hx   . Colon polyps Neg Hx   . Inflammatory bowel disease Neg Hx   . Hypertension Mother   . Hypertension Father     History   Social History  . Marital Status: Single    Spouse Name: N/A    Number of Children: N/A  . Years of Education: N/A   Occupational History  . Not on file.   Social History Main Topics  . Smoking status: Current Every Day Smoker -- 0.50 packs/day    Types: Cigarettes  . Smokeless tobacco: Not on file  . Alcohol Use: No  . Drug Use: Yes  Special: Marijuana  . Sexual Activity: Yes    Birth Control/ Protection: None   Other Topics Concern  . Not on file   Social History Narrative   Smokes cigs  & pot.      ROS:  General: Negative for anorexia,fever, chills, fatigue, weakness. 15 pound weight loss Eyes: Negative for vision changes.  ENT: Negative for hoarseness, difficulty swallowing , nasal congestion. CV: Negative for chest pain, angina, palpitations, dyspnea on exertion, peripheral edema.  Respiratory: Negative for dyspnea at rest, dyspnea on exertion, cough, sputum, wheezing.  GI: See history of present illness. GU:  Negative for dysuria, hematuria, urinary incontinence, urinary frequency, nocturnal urination.  MS: Negative for  joint pain, low back pain.  Derm: Negative for rash or itching.  Neuro: Negative for weakness, abnormal sensation, seizure, frequent headaches, memory loss, confusion.  Psych: Negative for anxiety, depression, suicidal ideation, hallucinations.  Endo: Negative for unusual weight change.  Heme: Negative for bruising or bleeding. Allergy: Negative for rash or hives.    Physical Examination:  BP 100/65  Pulse 76  Temp(Src) 97.3 F (36.3 C) (Oral)  Ht 5' 9.5" (1.765 m)  Wt 139 lb (63.05 kg)  BMI 20.24 kg/m2   General: Well-nourished, well-developed in no acute distress.  Head: Normocephalic, atraumatic.   Eyes: Conjunctiva pink, no icterus. Mouth: Oropharyngeal mucosa moist and pink , no lesions erythema or exudate. Neck: Supple without thyromegaly, masses, or lymphadenopathy.  Lungs: Clear to auscultation bilaterally.  Heart: Regular rate and rhythm, no murmurs rubs or gallops.  Abdomen: Bowel sounds are normal, moderate diffuse tenderness, nondistended, no hepatosplenomegaly or masses, no abdominal bruits or    hernia , no rebound or guarding.   Rectal: Not performed Extremities: No lower extremity edema. No clubbing or deformities.  Neuro: Alert and oriented x 4 , grossly normal neurologically.  Skin: Warm and dry, no rash or jaundice.   Psych: Alert and cooperative, normal mood and affect.    Imaging Studies: Dg Small Bowel  12/05/2013   CLINICAL DATA:  Abdominal pain, Crohn's ileitis with stricture  EXAM: SMALL BOWEL SERIES  COMPARISON:  CT abdomen pelvis 08/08/2013  TECHNIQUE: Following ingestion of thin barium, serial small bowel images were obtained including spot views of the terminal ileum.  FLUOROSCOPY TIME:  1 min 24 seconds  FINDINGS: Normal bowel gas pattern on scout image.  Multiple pelvic phleboliths.  Osseous structures unremarkable.  No gastric outlet obstruction.  Normal appearing duodenal, jejunal and proximal ileal small bowel loops.  Normal mucosal fold patterns  seen within these segments without bowel dilatation, wall thickening or obstruction.  Terminal ileum demonstrates mucosal fold thickening extending into the ileocecal valve and cecum, matching the segment of involvement seen on CT.  Slightly more inflammation of the ileocecal valve and cecum suggested by current small bowel study than on prior CT.  No evidence of bowel obstruction or dilatation.  Ascending colon and visualized transverse/descending colon grossly unremarkable.  IMPRESSION: Long segment of mucosal/wall thickening involving the terminal ileum extending into the ileocecal valve and cecum compatible with history of Crohn's disease.  Findings appear slightly progressive at the cecum since the previous CT exam.  While the lumen appears narrowed, no evidence of distal ileal small bowel obstruction identified.   Electronically Signed   By: Lavonia Dana M.D.   On: 12/05/2013 10:34

## 2013-12-07 NOTE — Patient Instructions (Signed)
1. Please call your pharmacy and find out how much your prednisone and hydrocodone will cost. You do not have to get the ultram. 2. I will discuss your case with Dr. Oneida Alar regarding Humira.

## 2013-12-10 ENCOUNTER — Other Ambulatory Visit (HOSPITAL_COMMUNITY): Payer: Self-pay

## 2013-12-10 NOTE — Progress Notes (Signed)
cc'd to pcp 

## 2013-12-17 NOTE — Progress Notes (Signed)
Please let patient know that Dr. Oneida Alar recommends going back on Remicade.  We will work towards double dose of 17m/kg every 8 weeks but likely have to start back on initiation dose.   Please start process of pursuing this. ?patient assistance etc. Patient is uninsured.  Continue Humira until we know he has been approved for Remicade.

## 2013-12-18 NOTE — Progress Notes (Signed)
Pt called and was informed.He wants to know how to go about getting his next Humira until he goes back on the Remicade.   He took his last dose last Friday, 12/14/2013, and was taking every two weeks.   Please advise!  Forwarding to Dr. Oneida Alar and Burnadette Peter.

## 2013-12-18 NOTE — Progress Notes (Signed)
PLEASE CALL Pt's pharmacy and have them send him HUMIRA.

## 2013-12-18 NOTE — Progress Notes (Signed)
Pt had called and left a phone number of [phone] for me to call him. I called and could not leave a VM.

## 2013-12-18 NOTE — Progress Notes (Signed)
Called and left message for a return call.

## 2013-12-19 NOTE — Progress Notes (Signed)
Forwarding to [place].

## 2013-12-19 NOTE — Progress Notes (Signed)
Pt gets humira from the patient assistance program. They had him approved for one year and will automatically send the humira to him. Gave him the phone number for [employer] and told him that if his shipment did not come, he should call them. Reminded him to continue the humira until we get the remicade approved. He voiced understanding. Pt has already been on the patient assistance for remicade and knows how to fill out paperwork and what I need to process the request. I have mailed pt assistance forms to him. He said if he had any problems he would call us.

## 2013-12-26 NOTE — Progress Notes (Signed)
REVIEWED.  

## 2013-12-27 ENCOUNTER — Telehealth: Payer: Self-pay

## 2013-12-27 NOTE — Telephone Encounter (Signed)
Pt left Vm that he was having some problems getting all of his Humira.  I called and left Vm for a return call.

## 2013-12-31 ENCOUNTER — Telehealth: Payer: Self-pay | Admitting: Gastroenterology

## 2013-12-31 NOTE — Telephone Encounter (Signed)
Called and Joshua Robertson is at lunch. Call back later at [phone].

## 2013-12-31 NOTE — Telephone Encounter (Signed)
Joshua Robertson, I spoke with Joshua Robertson. She said they can schedule when they get the order. For pre-medications they use Claritin 10 mg po and Tylenol 650 mg po. ( write it on the orders) If they need anything else they have to call the doctor.   Per Joshua Robertson is just now faxing the PA.

## 2013-12-31 NOTE — Telephone Encounter (Signed)
Patient assistance for Remicade completed.  Plans to start 93m/kg IV at weeks 0,2,6 and then 153mkg IV every 8 weeks.  He will need premedicated per protocol (please discuss with ElKris MoutonRN).

## 2014-01-01 NOTE — Telephone Encounter (Signed)
Patient assistance paperwork has been faxed to the company.

## 2014-01-01 NOTE — Telephone Encounter (Signed)
Routing the note to Neil Crouch, PA.

## 2014-01-07 NOTE — Telephone Encounter (Signed)
I called pt . See phone note of 12/27/2013.

## 2014-01-07 NOTE — Telephone Encounter (Signed)
Pt called wanting to speak with Tamela Oddi. Please advise [phone]

## 2014-01-07 NOTE — Telephone Encounter (Signed)
Pt left VM and I returned his call. He said that we have to order his Humira in the interim for the Remicade. Per Almyra Free, she has faxed the orders for assistance for the Remicade. Dr. Oneida Alar, please advise!  Also, pt states that he needs another prescription for pain medication ( Hydrocodone )

## 2014-01-15 NOTE — Telephone Encounter (Signed)
I called pt- LMOM- I need to know when his last humira injection was and what are the best days that he can go to [facility] so I can get it scheduled for him.

## 2014-01-15 NOTE — Telephone Encounter (Signed)
Patient assistance for Remicade has been approved. Form with instructions and delivery address for [facility] pharmacy is on AS desk for rx and to be signed. [facility] orders will need to be done when this is scheduled with the hospital. Tried to call Kris Mouton at [facility] and she is on vacation this week. Called [facility] pharmacy and they said for medication to be delivered to them.

## 2014-01-15 NOTE — Telephone Encounter (Signed)
Pt called and is aware that his Remicade has been approved and will be scheduled soon.  Waiting for the prescription to arrive at [facility].  Also, sending message to Dr. Oneida Alar in reference to request for pain meds. Michela Pitcher he is in some pain, but he has been kind of managing.

## 2014-01-15 NOTE — Telephone Encounter (Signed)
I spoke with pt, his last humira injection  Was 4 weeks ago. Per [facility] pt needed to wait 2 weeks in between humira and remicade and needs to be premedicated prior to getting remicade. All forms have been faxed back to the pt assistance company. Filling out paperwork and orders to send to [facility] for pt to be scheduled. He can go any morning but needs time to let work know that he needs the time off.

## 2014-01-15 NOTE — Telephone Encounter (Signed)
All orders and paperwork have been faxed to [facility]

## 2014-01-16 ENCOUNTER — Encounter (HOSPITAL_COMMUNITY): Payer: Self-pay | Admitting: *Deleted

## 2014-01-21 ENCOUNTER — Encounter (HOSPITAL_COMMUNITY)
Admission: RE | Admit: 2014-01-21 | Discharge: 2014-01-21 | Disposition: A | Discharge status: Home / Self Care | Payer: Self-pay | Source: Ambulatory Visit | Attending: Gastroenterology | Admitting: Gastroenterology

## 2014-01-21 DIAGNOSIS — K5090 Crohn's disease, unspecified, without complications: Secondary | ICD-10-CM | POA: Insufficient documentation

## 2014-01-21 MED ORDER — LORATADINE 10 MG PO TABS
10.0000 mg | ORAL_TABLET | Freq: Once | ORAL | Status: AC
  Administered 2014-01-21: 10 mg via ORAL

## 2014-01-21 MED ORDER — METHYLPREDNISOLONE SODIUM SUCC 125 MG IJ SOLR
INTRAMUSCULAR | Status: AC
  Filled 2014-01-21: qty 2

## 2014-01-21 MED ORDER — ACETAMINOPHEN 325 MG PO TABS
650.0000 mg | ORAL_TABLET | Freq: Once | ORAL | Status: AC
  Administered 2014-01-21: 650 mg via ORAL

## 2014-01-21 MED ORDER — METHYLPREDNISOLONE SODIUM SUCC 125 MG IJ SOLR
40.0000 mg | Freq: Once | INTRAMUSCULAR | Status: AC
  Administered 2014-01-21: 40 mg via INTRAVENOUS

## 2014-01-21 MED ORDER — LORATADINE 10 MG PO TABS
ORAL_TABLET | ORAL | Status: AC
  Filled 2014-01-21: qty 1

## 2014-01-21 MED ORDER — SODIUM CHLORIDE 0.9 % IV SOLN
Freq: Once | INTRAVENOUS | Status: AC
  Administered 2014-01-21: 250 mL via INTRAVENOUS

## 2014-01-21 MED ORDER — ACETAMINOPHEN 325 MG PO TABS
ORAL_TABLET | ORAL | Status: AC
  Filled 2014-01-21: qty 2

## 2014-01-21 MED ORDER — SODIUM CHLORIDE 0.9 % IV SOLN
300.0000 mg | Freq: Once | INTRAVENOUS | Status: AC
  Administered 2014-01-21: 300 mg via INTRAVENOUS
  Filled 2014-01-21: qty 30

## 2014-01-23 NOTE — Telephone Encounter (Signed)
REVIEWED.  

## 2014-02-04 ENCOUNTER — Encounter (HOSPITAL_COMMUNITY)
Admission: RE | Admit: 2014-02-04 | Discharge: 2014-02-04 | Disposition: A | Discharge status: Home / Self Care | Payer: Self-pay | Source: Ambulatory Visit | Attending: Gastroenterology | Admitting: Gastroenterology

## 2014-02-04 MED ORDER — ACETAMINOPHEN 325 MG PO TABS
650.0000 mg | ORAL_TABLET | Freq: Once | ORAL | Status: AC
  Administered 2014-02-04: 650 mg via ORAL

## 2014-02-04 MED ORDER — LORATADINE 10 MG PO TABS
ORAL_TABLET | ORAL | Status: AC
  Filled 2014-02-04: qty 1

## 2014-02-04 MED ORDER — ACETAMINOPHEN 325 MG PO TABS
ORAL_TABLET | ORAL | Status: AC
  Filled 2014-02-04: qty 2

## 2014-02-04 MED ORDER — METHYLPREDNISOLONE SODIUM SUCC 125 MG IJ SOLR
INTRAMUSCULAR | Status: AC
  Filled 2014-02-04: qty 2

## 2014-02-04 MED ORDER — SODIUM CHLORIDE 0.9 % IV SOLN
INTRAVENOUS | Status: DC
  Administered 2014-02-04: 10:00:00 via INTRAVENOUS

## 2014-02-04 MED ORDER — SODIUM CHLORIDE 0.9 % IV SOLN
300.0000 mg | Freq: Once | INTRAVENOUS | Status: AC
  Administered 2014-02-04: 300 mg via INTRAVENOUS
  Filled 2014-02-04: qty 30

## 2014-02-04 MED ORDER — LORATADINE 10 MG PO TABS
10.0000 mg | ORAL_TABLET | Freq: Every day | ORAL | Status: DC
  Administered 2014-02-04: 10 mg via ORAL

## 2014-02-04 MED ORDER — METHYLPREDNISOLONE SODIUM SUCC 125 MG IJ SOLR
40.0000 mg | Freq: Once | INTRAMUSCULAR | Status: AC
  Administered 2014-02-04: 40 mg via INTRAVENOUS

## 2014-02-15 ENCOUNTER — Telehealth: Payer: Self-pay

## 2014-02-15 NOTE — Telephone Encounter (Signed)
Pt called and left Vm requesting pain medication. Dr. Oneida Alar is out of the office and we will be leaving at 12:00 noon.  He said that he is at work now and we could call him back after 3:00 pm. I called and left VM for pt that we leave at 12:00   Noon today, and there is no way we could get a written prescription to him since he is not available til after 3:00. Also, Dr. Oneida Alar is on vacation til Tues and our office is closed on Mon. I told him if he had too much pain, all I could recommend was to go to the ED if he has to.

## 2014-02-19 MED ORDER — HYDROCODONE-ACETAMINOPHEN 5-325 MG PO TABS: ORAL_TABLET | ORAL | Status: DC

## 2014-02-19 NOTE — Telephone Encounter (Signed)
Pt left Vm that he is calling about the pain medicine prescription again. He can be reached after 3:00 pm today. Please advise!

## 2014-02-19 NOTE — Telephone Encounter (Signed)
PLEASE CALL PT. I DO NOT PRESCRIBE NARCOTICS FOR CHRONIC PAIN MANAGEMENT. HE WILL NEED A PAIN CLINIC APPT. HE SHOULD CONTINUE HIS REMICADE AND IF HE IS HAVING ABDOMINAL PAIN THAT IS NOT RESPONDING TO REMICADE WE WILL NEED TO RE-IMAGE HIS ABDOMEN. I WILL GIVE HIM #30 HYDROCODONE. HE CAN PICK UP RX TOMORROW.

## 2014-02-20 ENCOUNTER — Telehealth: Payer: Self-pay | Admitting: Gastroenterology

## 2014-02-20 NOTE — Telephone Encounter (Signed)
I returned pt's call and informed him. He said he is not having stomach problems, he had hurt his hand and was having pain and just needed some pain medicine for a little while. He will come by tomorrow after 3:00 Pm for the prescription. He will also let us know if he has problems with his stomach.

## 2014-02-20 NOTE — Telephone Encounter (Signed)
Called and informed pt's significant other.

## 2014-02-20 NOTE — Telephone Encounter (Signed)
Phone call from [place] family, and I told them to have pt call me when he gets off work.

## 2014-02-20 NOTE — Telephone Encounter (Signed)
See phone note of 02/15/2014.

## 2014-02-20 NOTE — Telephone Encounter (Signed)
PLEASE CALL PT. LET HIM KNOW THAT LEGALLY I CANNOT PRESCRIBE PAIN MEDICINE FOR HIS HAND. HE WILL NEED TO GO TO URGENT CARE FOR AN EVALUATION. HE CAN LET THE DOCTOR KNOW THAT HE SHOULD NOT TAKE NSAIDS BECAUSE HE HAS CROHN'S DISEASE. HE SHOULD USE TYLENOL OR TYLENOL THAT CONTAINS A NARCOTIC.

## 2014-02-20 NOTE — Telephone Encounter (Signed)
LMOM for pt to call. 

## 2014-02-20 NOTE — Telephone Encounter (Signed)
PATIENT RETURNED [place]  [phone]

## 2014-03-04 ENCOUNTER — Encounter (HOSPITAL_COMMUNITY)
Admission: RE | Admit: 2014-03-04 | Discharge: 2014-03-04 | Disposition: A | Discharge status: Home / Self Care | Payer: Self-pay | Source: Ambulatory Visit | Attending: Gastroenterology | Admitting: Gastroenterology

## 2014-03-04 DIAGNOSIS — K5090 Crohn's disease, unspecified, without complications: Secondary | ICD-10-CM | POA: Insufficient documentation

## 2014-03-04 MED ORDER — SODIUM CHLORIDE 0.9 % IV SOLN
INTRAVENOUS | Status: DC
  Administered 2014-03-04: 200 mL via INTRAVENOUS

## 2014-03-04 MED ORDER — ACETAMINOPHEN 325 MG PO TABS
650.0000 mg | ORAL_TABLET | Freq: Once | ORAL | Status: AC
  Administered 2014-03-04: 650 mg via ORAL

## 2014-03-04 MED ORDER — LORATADINE 10 MG PO TABS
10.0000 mg | ORAL_TABLET | Freq: Every day | ORAL | Status: DC
  Administered 2014-03-04: 10 mg via ORAL

## 2014-03-04 MED ORDER — METHYLPREDNISOLONE SODIUM SUCC 125 MG IJ SOLR
40.0000 mg | Freq: Once | INTRAMUSCULAR | Status: AC
  Administered 2014-03-04: 40 mg via INTRAVENOUS
  Filled 2014-03-04: qty 2

## 2014-03-04 MED ORDER — SODIUM CHLORIDE 0.9 % IV SOLN
300.0000 mg | Freq: Once | INTRAVENOUS | Status: AC
  Administered 2014-03-04: 300 mg via INTRAVENOUS
  Filled 2014-03-04: qty 30

## 2014-03-04 MED ORDER — ACETAMINOPHEN 325 MG PO TABS
ORAL_TABLET | ORAL | Status: AC
  Filled 2014-03-04: qty 2

## 2014-04-22 ENCOUNTER — Encounter (HOSPITAL_COMMUNITY)
Admission: RE | Admit: 2014-04-22 | Discharge: 2014-04-22 | Disposition: A | Discharge status: Home / Self Care | Payer: Self-pay | Source: Ambulatory Visit | Attending: Gastroenterology | Admitting: Gastroenterology

## 2014-04-22 ENCOUNTER — Encounter (HOSPITAL_COMMUNITY): Payer: Self-pay

## 2014-04-22 DIAGNOSIS — K5090 Crohn's disease, unspecified, without complications: Secondary | ICD-10-CM | POA: Insufficient documentation

## 2014-04-22 MED ORDER — ACETAMINOPHEN 325 MG PO TABS
650.0000 mg | ORAL_TABLET | Freq: Once | ORAL | Status: AC
  Administered 2014-04-22: 650 mg via ORAL
  Filled 2014-04-22: qty 2

## 2014-04-22 MED ORDER — LORATADINE 10 MG PO TABS
10.0000 mg | ORAL_TABLET | Freq: Every day | ORAL | Status: DC
Start: 2014-04-22 — End: 2014-04-23
  Administered 2014-04-22: 10 mg via ORAL
  Filled 2014-04-22: qty 1

## 2014-04-22 MED ORDER — SODIUM CHLORIDE 0.9 % IV SOLN
700.0000 mg | INTRAVENOUS | Status: DC
  Administered 2014-04-22: 700 mg via INTRAVENOUS
  Filled 2014-04-22: qty 70

## 2014-04-22 MED ORDER — METHYLPREDNISOLONE SODIUM SUCC 125 MG IJ SOLR
40.0000 mg | Freq: Once | INTRAMUSCULAR | Status: AC
  Administered 2014-04-22: 40 mg via INTRAVENOUS
  Filled 2014-04-22: qty 2

## 2014-04-22 MED ORDER — SODIUM CHLORIDE 0.9 % IV SOLN: INTRAVENOUS | Status: DC

## 2014-06-17 ENCOUNTER — Encounter (HOSPITAL_COMMUNITY): Payer: Self-pay

## 2014-06-17 ENCOUNTER — Encounter (HOSPITAL_COMMUNITY)
Admission: RE | Admit: 2014-06-17 | Discharge: 2014-06-17 | Disposition: A | Discharge status: Home / Self Care | Payer: Self-pay | Source: Ambulatory Visit | Attending: Gastroenterology | Admitting: Gastroenterology

## 2014-06-17 DIAGNOSIS — K5090 Crohn's disease, unspecified, without complications: Secondary | ICD-10-CM | POA: Insufficient documentation

## 2014-06-17 MED ORDER — SODIUM CHLORIDE 0.9 % IV SOLN
700.0000 mg | Freq: Once | INTRAVENOUS | Status: AC
  Administered 2014-06-17: 700 mg via INTRAVENOUS
  Filled 2014-06-17: qty 70

## 2014-06-17 MED ORDER — METHYLPREDNISOLONE SODIUM SUCC 125 MG IJ SOLR
40.0000 mg | Freq: Once | INTRAMUSCULAR | Status: AC
  Administered 2014-06-17: 40 mg via INTRAVENOUS
  Filled 2014-06-17: qty 2

## 2014-06-17 MED ORDER — ACETAMINOPHEN 325 MG PO TABS
650.0000 mg | ORAL_TABLET | Freq: Four times a day (QID) | ORAL | Status: DC | PRN
  Administered 2014-06-17: 650 mg via ORAL
  Filled 2014-06-17: qty 2

## 2014-06-17 MED ORDER — LORATADINE 10 MG PO TABS
10.0000 mg | ORAL_TABLET | Freq: Every day | ORAL | Status: DC
  Administered 2014-06-17: 10 mg via ORAL
  Filled 2014-06-17: qty 1

## 2014-06-17 MED ORDER — SODIUM CHLORIDE 0.9 % IV SOLN
INTRAVENOUS | Status: DC
  Administered 2014-06-17: 09:00:00 via INTRAVENOUS

## 2014-08-01 ENCOUNTER — Telehealth: Payer: Self-pay | Admitting: Gastroenterology

## 2014-08-01 NOTE — Telephone Encounter (Signed)
PATIENT HAS APPOINTMENT AND IS AWARE OF DATE AND TIME

## 2014-08-01 NOTE — Telephone Encounter (Signed)
FEB 24TH 11:30 IS WHEN APPOINTMENT WAS MADE

## 2014-08-01 NOTE — Telephone Encounter (Signed)
[facility] FEB 24 AT 1130 AM OR MAR 2 AT 1000 E30 CROHN'S DISEASE.

## 2014-08-01 NOTE — Telephone Encounter (Signed)
Routing to [place] to schedule.

## 2014-08-01 NOTE — Telephone Encounter (Signed)
Patient called today asking when his last visit with [place] was. He is trying to get disability and they were needing a date of service. I told him [facility]SL seen him last in June 2015.  Patient then asked when should he follow up again. He isn't on the recall list and I told him that I would ask SF when she would like to see him again. Please advise. [phone]

## 2014-08-01 NOTE — Telephone Encounter (Signed)
Routing to Dr. Oneida Alar to advise!

## 2014-08-07 ENCOUNTER — Ambulatory Visit (INDEPENDENT_AMBULATORY_CARE_PROVIDER_SITE_OTHER): Payer: Self-pay | Admitting: Gastroenterology

## 2014-08-07 ENCOUNTER — Encounter: Payer: Self-pay | Admitting: Gastroenterology

## 2014-08-07 VITALS — BP 104/65 | HR 81 | Temp 97.5°F | Ht 69.0 in | Wt 148.0 lb

## 2014-08-07 DIAGNOSIS — K50012 Crohn's disease of small intestine with intestinal obstruction: Secondary | ICD-10-CM

## 2014-08-07 MED ORDER — HYDROCODONE-ACETAMINOPHEN 5-325 MG PO TABS: ORAL_TABLET | ORAL | Status: DC

## 2014-08-07 NOTE — Progress Notes (Signed)
   Subjective:    Patient ID: Joshua Robertson, male    DOB: 15-Feb-1976, 39 y.o.   MRN: [id number]  MUSE,ROCHELLE D., PA-C  HPI STILL NO RX DRUG COVERAGE. REMICADE GOING FOR JUN 2015. NO PROBLEMS WITH INFUSION. NOT MISSING ANY APPTS. NEXT ONE MON. NAUSEA RANDOM: REALLY DEPENDS. VOMITING: 1-2 Q2 WEEKS. BMs: #1 , WITH #5-6. BEEN IN PAIN AND OUT OF PAIN MEDS.  NO VISIT SINCE MAR 2015.   PT DENIES FEVER, CHILLS, HEMATOCHEZIA, melena, diarrhea, CHEST PAIN, SHORTNESS OF BREATH,  CHANGE IN BOWEL IN HABITS, constipation, abdominal pain, problems swallowing, problems with sedation, heartburn or indigestion.   Past Medical History  Diagnosis Date  . Helicobacter pylori gastritis 2009  . Spondyloarthropathy 2009 EROSIVE  . Sacroiliitis 2009  . Migraine headache     NONE SINCE AGE 53  . BMI between 19-24,adult JAN 2010 149 LBS  . Crohn's disease AUG 2009 NUR [place]    HB 9.4 FERRITIN 26  140 LBS; no serologies-pt can't afford test  . Hypertension    Past Surgical History  Procedure Laterality Date  . Colonoscopy    . Colonoscopy N/A 07/30/2013    stenotic IC VALVE. UNABLE TO INTUBATE ILEUM.   Allergies  Allergen Reactions  . Azathioprine Nausea And Vomiting  . Penicillins Swelling and Rash  . Pentasa [Mesalamine Er] Rash    Current Outpatient Prescriptions  Medication Sig Dispense Refill  . benazepril (LOTENSIN) 40 MG tablet Take 1 tablet (40 mg total) by mouth daily. 30 tablet 2  . cloNIDine (CATAPRES) 0.1 MG tablet Take 1 tablet (0.1 mg total) by mouth 2 (two) times daily. 60 tablet 11  . hydrochlorothiazide (MICROZIDE) 12.5 MG capsule Take 12.5 mg by mouth daily.    Marland Kitchen inFLIXimab (REMICADE) 100 MG injection Inject 700 mg into the vein every 8 (eight) weeks.     .      .       Review of Systems     Objective:   Physical Exam  Constitutional: He is oriented to person, place, and time. He appears well-developed. No distress.  APPEARS VERY CALM  HENT:  Head: Normocephalic and  atraumatic.  Mouth/Throat: Oropharynx is clear and moist. No oropharyngeal exudate.  Eyes: Pupils are equal, round, and reactive to light. No scleral icterus.  Neck: Normal range of motion. Neck supple.  Cardiovascular: Normal rate, regular rhythm and normal heart sounds.   Pulmonary/Chest: Effort normal and breath sounds normal. No respiratory distress.  Abdominal: Soft. Bowel sounds are normal. He exhibits no distension. There is tenderness. There is no rebound and no guarding.  MILD LEFT PERI-UMBILICAL TTP   Musculoskeletal: He exhibits no edema.  Lymphadenopathy:    He has no cervical adenopathy.  Neurological: He is alert and oriented to person, place, and time.  NO FOCAL DEFICITS   Psychiatric:  FLAT AFFECT, NL MOOD   Vitals reviewed.         Assessment & Plan:

## 2014-08-07 NOTE — Assessment & Plan Note (Addendum)
SX FAIRLY WELL CONTROLLED BUT PT LOSING WEIGHT. NAUSEA, VOMITING, BOWEL CHANGES, AND ABDOMINAL PAIN DUE TO ILEAL STRICTURE. LAST TCS FEB 2015: NL COLON/RECTUM.   CONTINUE REMICADE AWAIT DISBAILITY CIB W/ PROTEIN POWDER TID FOLLOW UP IN 3 MOS.

## 2014-08-07 NOTE — Patient Instructions (Signed)
CONTINUE REMICADE. YOU CAN HAVE LABS DRAWN BEFORE YOUR REMICADE.  AWAIT DISABILITY.  MAKE A MILK SHAKE USING CARNATION INSTANT BREAKFAST WITHONE SCOOP OF PROTEIN POWDER THREE TIMES A DAY.  FOLLOW UP IN 3 MOS.

## 2014-08-07 NOTE — Progress Notes (Signed)
ON RECALL LIST  °

## 2014-08-12 ENCOUNTER — Encounter (HOSPITAL_COMMUNITY)
Admission: RE | Admit: 2014-08-12 | Discharge: 2014-08-12 | Disposition: A | Discharge status: Home / Self Care | Payer: Self-pay | Source: Ambulatory Visit | Attending: Gastroenterology | Admitting: Gastroenterology

## 2014-08-13 NOTE — Progress Notes (Signed)
CC'ED TO PCP 

## 2014-08-14 ENCOUNTER — Inpatient Hospital Stay (HOSPITAL_COMMUNITY): Admission: RE | Admit: 2014-08-14 | Payer: Self-pay | Source: Ambulatory Visit

## 2014-08-14 ENCOUNTER — Encounter (HOSPITAL_COMMUNITY)
Admission: RE | Admit: 2014-08-14 | Discharge: 2014-08-14 | Disposition: A | Discharge status: Home / Self Care | Payer: Self-pay | Source: Ambulatory Visit | Attending: Gastroenterology | Admitting: Gastroenterology

## 2014-08-14 DIAGNOSIS — K5090 Crohn's disease, unspecified, without complications: Secondary | ICD-10-CM | POA: Insufficient documentation

## 2014-08-14 LAB — CBC
HEMATOCRIT: 35.5 % — AB (ref 39.0–52.0)
Hemoglobin: 11.7 g/dL — ABNORMAL LOW (ref 13.0–17.0)
MCH: 24.9 pg — ABNORMAL LOW (ref 26.0–34.0)
MCHC: 33 g/dL (ref 30.0–36.0)
MCV: 75.7 fL — ABNORMAL LOW (ref 78.0–100.0)
PLATELETS: 311 10*3/uL (ref 150–400)
RBC: 4.69 MIL/uL (ref 4.22–5.81)
RDW: 15.5 % (ref 11.5–15.5)
WBC: 9.2 10*3/uL (ref 4.0–10.5)

## 2014-08-14 LAB — COMPREHENSIVE METABOLIC PANEL
ALK PHOS: 52 U/L (ref 39–117)
ALT: 13 U/L (ref 0–53)
AST: 22 U/L (ref 0–37)
Albumin: 4 g/dL (ref 3.5–5.2)
Anion gap: 7 (ref 5–15)
BILIRUBIN TOTAL: 0.3 mg/dL (ref 0.3–1.2)
BUN: 17 mg/dL (ref 6–23)
CHLORIDE: 100 mmol/L (ref 96–112)
CO2: 31 mmol/L (ref 19–32)
Calcium: 9.4 mg/dL (ref 8.4–10.5)
Creatinine, Ser: 1.03 mg/dL (ref 0.50–1.35)
GFR calc non Af Amer: >90 mL/min (ref >90)
GLUCOSE: 89 mg/dL (ref 70–99)
POTASSIUM: 4.3 mmol/L (ref 3.5–5.1)
Sodium: 138 mmol/L (ref 135–145)
Total Protein: 7.3 g/dL (ref 6.0–8.3)

## 2014-08-14 MED ORDER — ACETAMINOPHEN 325 MG PO TABS
650.0000 mg | ORAL_TABLET | Freq: Four times a day (QID) | ORAL | Status: DC | PRN
  Administered 2014-08-14: 325 mg via ORAL

## 2014-08-14 MED ORDER — SODIUM CHLORIDE 0.9 % IV SOLN
10.0000 mg/kg | INTRAVENOUS | Status: DC
  Administered 2014-08-14 (×6): 700 mg via INTRAVENOUS
  Filled 2014-08-14: qty 70

## 2014-08-14 MED ORDER — METHYLPREDNISOLONE SODIUM SUCC 125 MG IJ SOLR
40.0000 mg | Freq: Once | INTRAMUSCULAR | Status: AC
  Administered 2014-08-14: 40 mg via INTRAVENOUS

## 2014-08-14 MED ORDER — ACETAMINOPHEN 325 MG PO TABS
ORAL_TABLET | ORAL | Status: AC
  Filled 2014-08-14: qty 2

## 2014-08-14 MED ORDER — METHYLPREDNISOLONE SODIUM SUCC 125 MG IJ SOLR
INTRAMUSCULAR | Status: AC
  Filled 2014-08-14: qty 2

## 2014-08-14 MED ORDER — LORATADINE 10 MG PO TABS
10.0000 mg | ORAL_TABLET | Freq: Every day | ORAL | Status: DC
  Administered 2014-08-14: 10 mg via ORAL

## 2014-08-14 MED ORDER — LORATADINE 10 MG PO TABS
ORAL_TABLET | ORAL | Status: AC
  Filled 2014-08-14: qty 1

## 2014-08-14 NOTE — Progress Notes (Signed)
Results for Joshua Robertson, Joshua Robertson (MRN [id number]) as of 08/14/2014 10:05  Ref. Range 08/14/2014 09:05  Sodium Latest Range: 135-145 mmol/L 138  Potassium Latest Range: 3.5-5.1 mmol/L 4.3  Chloride Latest Range: 96-112 mmol/L 100  CO2 Latest Range: 19-32 mmol/L 31  BUN Latest Range: 6-23 mg/dL 17  Creatinine Latest Range: 0.50-1.35 mg/dL 1.03  Calcium Latest Range: 8.4-10.5 mg/dL 9.4  GFR calc non Af Amer Latest Range: >90 mL/min >90  GFR calc Af Amer Latest Range: >90 mL/min >90  Glucose Latest Range: 70-99 mg/dL 89  Anion gap Latest Range: 5-15  7  Alkaline Phosphatase Latest Range: 39-117 U/L 52  Albumin Latest Range: 3.5-5.2 g/dL 4.0  AST Latest Range: 0-37 U/L 22  ALT Latest Range: 0-53 U/L 13  Total Protein Latest Range: 6.0-8.3 g/dL 7.3  Total Bilirubin Latest Range: 0.3-1.2 mg/dL 0.3  WBC Latest Range: 4.0-10.5 K/uL 9.2  RBC Latest Range: 4.22-5.81 MIL/uL 4.69  Hemoglobin Latest Range: 13.0-17.0 g/dL 11.7 (L)  HCT Latest Range: 39.0-52.0 % 35.5 (L)  MCV Latest Range: 78.0-100.0 fL 75.7 (L)  MCH Latest Range: 26.0-34.0 pg 24.9 (L)  MCHC Latest Range: 30.0-36.0 g/dL 33.0  RDW Latest Range: 11.5-15.5 % 15.5  Platelets Latest Range: 150-400 K/uL 311

## 2014-08-23 NOTE — Progress Notes (Addendum)
PLEASE CALL PT. HIS LABS ARE ESSENTIALLY UNCHANGED. HIS BLOOD COUNT IS 11.7. HIS LIVER AND KIDNEY TESTS ARE NORMAL.

## 2014-08-26 NOTE — Progress Notes (Signed)
Pt is aware of results. 

## 2014-09-30 ENCOUNTER — Telehealth: Payer: Self-pay

## 2014-09-30 NOTE — Telephone Encounter (Signed)
LMOM for a return call.  

## 2014-09-30 NOTE — Telephone Encounter (Signed)
Pt said that he had discussed with Dr. Oneida Alar at last [place] about some surgery at [facility] and he was to think about it.  He is ready to go forward with Dr.Fields recommendations. He will be gong out of town, but can discuss or plan anytime after Friday this week.

## 2014-09-30 NOTE — Telephone Encounter (Signed)
Tried calling pt many times. Unable to reach. Phone will go through.  The number he needs is [phone]

## 2014-09-30 NOTE — Telephone Encounter (Signed)
PLEASE CALL PT. HE ONLY HAS TO CALL [facility] TO MAKE AN APPT.

## 2014-09-30 NOTE — Telephone Encounter (Signed)
Routing to [place] pool to call pt with the phone number that he needs.

## 2014-10-01 NOTE — Telephone Encounter (Signed)
LMOM for a return call.  

## 2014-10-07 ENCOUNTER — Encounter (HOSPITAL_COMMUNITY): Payer: Self-pay | Admitting: Emergency Medicine

## 2014-10-07 ENCOUNTER — Emergency Department (HOSPITAL_COMMUNITY)
Admission: EM | Admit: 2014-10-07 | Discharge: 2014-10-07 | Disposition: A | Discharge status: Home / Self Care | Payer: Self-pay | Attending: Emergency Medicine | Admitting: Emergency Medicine

## 2014-10-07 DIAGNOSIS — G43909 Migraine, unspecified, not intractable, without status migrainosus: Secondary | ICD-10-CM | POA: Insufficient documentation

## 2014-10-07 DIAGNOSIS — B353 Tinea pedis: Secondary | ICD-10-CM | POA: Insufficient documentation

## 2014-10-07 DIAGNOSIS — K5090 Crohn's disease, unspecified, without complications: Secondary | ICD-10-CM | POA: Insufficient documentation

## 2014-10-07 DIAGNOSIS — K121 Other forms of stomatitis: Secondary | ICD-10-CM | POA: Insufficient documentation

## 2014-10-07 DIAGNOSIS — I10 Essential (primary) hypertension: Secondary | ICD-10-CM | POA: Insufficient documentation

## 2014-10-07 DIAGNOSIS — Z79899 Other long term (current) drug therapy: Secondary | ICD-10-CM | POA: Insufficient documentation

## 2014-10-07 DIAGNOSIS — Z8739 Personal history of other diseases of the musculoskeletal system and connective tissue: Secondary | ICD-10-CM | POA: Insufficient documentation

## 2014-10-07 MED ORDER — MAGIC MOUTHWASH W/LIDOCAINE: 5.0000 mL | Freq: Four times a day (QID) | ORAL | Status: DC | PRN

## 2014-10-07 MED ORDER — TOLNAFTATE 1 % EX CREA: TOPICAL_CREAM | CUTANEOUS | Status: DC

## 2014-10-07 MED ORDER — TOLNAFTATE 1 % EX POWD: CUTANEOUS | Status: DC

## 2014-10-07 MED ORDER — DEXAMETHASONE 4 MG PO TABS: 4.0000 mg | ORAL_TABLET | Freq: Two times a day (BID) | ORAL | Status: DC

## 2014-10-07 NOTE — ED Provider Notes (Signed)
CSN: [id number]     Arrival date & time 10/07/14  0810 History   First MD Initiated Contact with Patient 10/07/14 [phone]     Chief Complaint  Patient presents with  . Rash     (Consider location/radiation/quality/duration/timing/severity/associated sxs/prior Treatment) HPI Comments: Patient is a 39 year old male who presents to the emergency department with a complaint of rash in the mouth, and on the right foot. The patient states that approximately 1-1/2 weeks ago he was treated with Keflex or problem with the tooth. He states that after this he noticed a rash in the mouth, and a rash on the right foot. This has not gone away. He now has rash also in the groin area that he is concerned about. There's been no high fever. There's been no drainage or discharge from any of the rash areas. The patient states he has had some mild or minor ear issues, but does not recall runny nose, sore throat, headache, coughing or sneezing, or other upper respiratory related issues.  Patient is a 39 y.o. male presenting with rash. The history is provided by the patient.  Rash Associated symptoms: headaches and joint pain     Past Medical History  Diagnosis Date  . Helicobacter pylori gastritis 2009  . Spondyloarthropathy 2009 EROSIVE  . Sacroiliitis 2009  . Migraine headache     NONE SINCE AGE 14  . BMI between 19-24,adult JAN 2010 149 LBS  . Crohn's disease AUG 2009 NUR [place]    HB 9.4 FERRITIN 26  140 LBS; no serologies-pt can't afford test  . Hypertension    Past Surgical History  Procedure Laterality Date  . Colonoscopy    . Colonoscopy N/A 07/30/2013    stenotic IC VALVE. UNABLE TO INTUBATE ILEUM.   Family History  Problem Relation Age of Onset  . Colon cancer Neg Hx   . Colon polyps Neg Hx   . Inflammatory bowel disease Neg Hx   . Hypertension Mother   . Hypertension Father    History  Substance Use Topics  . Smoking status: Current Every Day Smoker -- 0.50 packs/day    Types: Cigarettes   . Smokeless tobacco: Never Used  . Alcohol Use: No    Review of Systems  HENT: Positive for dental problem.   Musculoskeletal: Positive for arthralgias.  Skin: Positive for rash.  Neurological: Positive for headaches.  All other systems reviewed and are negative.     Allergies  Azathioprine; Penicillins; and Pentasa  Home Medications   Prior to Admission medications   Medication Sig Start Date End Date Taking? Authorizing Provider  benazepril (LOTENSIN) 40 MG tablet Take 1 tablet (40 mg total) by mouth daily. 06/02/11   Dorie Rank, MD  cloNIDine (CATAPRES) 0.1 MG tablet Take 1 tablet (0.1 mg total) by mouth 2 (two) times daily. 08/09/12   [name], MD  hydrochlorothiazide (MICROZIDE) 12.5 MG capsule Take 12.5 mg by mouth daily.    Historical Provider, MD  HYDROcodone-acetaminophen (NORCO/VICODIN) 5-325 MG per tablet 1 OR 2 EVERY 4-6 H PRN FOR PAIN 08/07/14   [name], MD  inFLIXimab (REMICADE) 100 MG injection Inject 700 mg into the vein every 8 (eight) weeks.     Historical Provider, MD   BP 101/62 mmHg  Pulse 73  Temp(Src) 98.6 F (37 C) (Oral)  Resp 18  Ht 5' 9.5" (1.765 m)  Wt 135 lb 3.2 oz (61.326 kg)  BMI 19.69 kg/m2  SpO2 100% Physical Exam  HENT:  There are  also ulcer areas of the buccal mucosa of the mouth, and one ulcer with a plaque on it on the right side of the tongue. There is no swelling under the tongue. The airway is patent.  Genitourinary:  There are dry scaling areas of the upper thigh extending into the groin creases.  There are dried in bunches of small lesions at the tip of the penis, also on the scrotum. And a few at the base of the penis. There is no active drainage or discharge from the urethra. There is no tenderness of the scrotum or testicles.  Musculoskeletal:  There is dry scaling areas about the web spaces between the toes of the right greater than left foot. In the web space between the third and fourth toe, and the fourth and  fifth toe on, there is some increased redness and some increased white areas consistent with a secondary infection related to fungus. There are fungal changes of the toenails. The dorsalis pedis pulses 2+. The posterior tibial pulses 2+. The capillary refill is less than 2 seconds.    ED Course  Procedures (including critical care time) Labs Review Labs Reviewed - No data to display  Imaging Review No results found.   EKG Interpretation None      MDM  the examination favors stomatitis involving the oral mucosa. There appears to be tinea involvement of the toes and the groin consistent with athlete's foot and jock itch. There are some dried in lesions of the head of the penis, and on the scrotum. I asked the patient about previous history of herpes. The patient in fact clear and ounces that he and his significant other have been tested and all other blood work is always come back negative.  The patient is treated with Tinactin cream and powder for the groin and for the feet. He is oriented Decadron and Magic mouthwash for the lesions in the mouth. I have asked the patient to monitor areas on the penis and scrotal area, and to see the physicians at the health department if this does not improve. The patient acknowledges understanding of these discharge instructions.      Final diagnoses:  None    **I have reviewed nursing notes, vital signs, and all appropriate lab and imaging results for this patient.Lily Kocher, PA-C 10/07/14 [id number]  Orpah Greek, MD 10/07/14 [phone]

## 2014-10-07 NOTE — ED Notes (Signed)
Pt reports he thinks he may have had an allergic reaction to Keflex. Pt has small area of rash on his face, his R foot and states he has a rash in his mouth.

## 2014-10-07 NOTE — Discharge Instructions (Signed)
Please use the medications as ordered. Please monitor the rash on the genitals. See the MD at the Health Dept if not improving. Stomatitis Stomatitis is an inflammation of the mucous lining of the mouth. It can affect part of the mouth or the whole mouth. The intensity of symptoms can range from mild to severe. It can affect your cheek, teeth, gums, lips, or tongue. In almost all cases, the lining of the mouth becomes swollen, red, and painful. Painful ulcers can develop in your mouth. Stomatitis recurs in some people. CAUSES  There are many common causes of stomatitis. They include:  Viruses (such as cold sores or shingles).  Canker sores.  Bacteria (such as ulcerative gingivitis or sexually transmitted diseases).  Fungus or yeast (such as candidiasis or oral thrush).  Poor oral hygiene and poor nutrition (Vincent's stomatitis or trench mouth).  Lack of vitamin B, vitamin C, or niacin.  Dentures or braces that do not fit properly.  High acid foods (uncommon).  Sharp or broken teeth.  Cheek biting.  Breathing through the mouth.  Chewing tobacco.  Allergy to toothpaste, mouthwash, candy, gum, lipstick, or some medicines.  Burning your mouth with hot drinks or food.  Exposure to dyes, heavy metals, acid fumes, or mineral dust. SYMPTOMS   Painful ulcers in the mouth.  Blisters in the mouth.  Bleeding gums.  Swollen gums.  Irritability.  Bad breath.  Bad taste in the mouth.  Fever.  Trouble eating because of burning and pain in the mouth. DIAGNOSIS  Your caregiver will examine your mouth and look for bleeding gums and mouth ulcers. Your caregiver may ask you about the medicines you are taking. Your caregiver may suggest a blood test and tissue sample (biopsy) of the mouth ulcer or mass if either is present. This will help find the cause of your condition. TREATMENT  Your treatment will depend on the cause of your condition. Your caregiver will first try to treat  your symptoms.   You may be given pain medicine. Topical anesthetic may be used to numb the area if you have severe pain.  Your caregiver may prescribe antibiotic medicine if you have a bacterial infection.  Your caregiver may prescribe antifungal medicine if you have a fungal infection.  You may need to take antiviral medicine if you have a viral infection like herpes.  You may be asked to use medicated mouth rinses.  Your caregiver will advise you about proper brushing and using a soft toothbrush. You also need to get your teeth cleaned regularly. HOME CARE INSTRUCTIONS   Maintain good oral hygiene. This is especially important for transplant patients.  Brush your teeth carefully with a soft, nylon-bristled toothbrush.  Floss at least 2 times a day.  Clean your mouth after eating.  Rinse your mouth with salt water 3 to 4 times a day.  Gargle with cold water.  Use topical numbing medicines to decrease pain if recommended by your caregiver.  Stop smoking, and stop using chewing or smokeless tobacco.  Avoid eating hot and spicy foods.  Eat soft and bland food.  Reduce your stress wherever possible.  Eat healthy and nutritious foods. SEEK MEDICAL CARE IF:   Your symptoms persist or get worse.  You develop new symptoms.  Your mouth ulcers are present for more than 3 weeks.  Your mouth ulcers come back frequently.  You have increasing difficulty with normal eating and drinking.  You have increasing fatigue or weakness.  You develop loss of appetite or  nausea. SEEK IMMEDIATE MEDICAL CARE IF:   You have a fever.  You develop pain, redness, or sores around one or both eyes.  You cannot eat or drink because of pain or other symptoms.  You develop worsening weakness, or you faint.  You develop vomiting or diarrhea.  You develop chest pain, shortness of breath, or rapid and irregular heartbeats. MAKE SURE YOU:  Understand these instructions.  Will watch  your condition.  Will get help right away if you are not doing well or get worse. Document Released: 03/28/2007 Document Revised: 08/23/2011 Document Reviewed: 01/07/2011 [facility] Patient Information 2015 [place], [place]. This information is not intended to replace advice given to you by your health care provider. Make sure you discuss any questions you have with your health care provider.

## 2014-10-07 NOTE — ED Notes (Signed)
PA at bedside.

## 2014-10-09 ENCOUNTER — Encounter: Payer: Self-pay | Admitting: Gastroenterology

## 2014-10-09 ENCOUNTER — Encounter (HOSPITAL_COMMUNITY): Payer: Self-pay

## 2014-10-09 ENCOUNTER — Encounter (HOSPITAL_COMMUNITY)
Admission: RE | Admit: 2014-10-09 | Discharge: 2014-10-09 | Disposition: A | Discharge status: Home / Self Care | Payer: Self-pay | Source: Ambulatory Visit | Attending: Gastroenterology | Admitting: Gastroenterology

## 2014-10-09 DIAGNOSIS — K5090 Crohn's disease, unspecified, without complications: Secondary | ICD-10-CM | POA: Insufficient documentation

## 2014-10-09 MED ORDER — LORATADINE 10 MG PO TABS
10.0000 mg | ORAL_TABLET | Freq: Every day | ORAL | Status: DC
  Administered 2014-10-09: 10 mg via ORAL
  Filled 2014-10-09: qty 1

## 2014-10-09 MED ORDER — METHYLPREDNISOLONE SODIUM SUCC 125 MG IJ SOLR
40.0000 mg | Freq: Once | INTRAMUSCULAR | Status: AC
  Administered 2014-10-09: 40 mg via INTRAVENOUS
  Filled 2014-10-09: qty 2

## 2014-10-09 MED ORDER — ACETAMINOPHEN 325 MG PO TABS
650.0000 mg | ORAL_TABLET | Freq: Once | ORAL | Status: AC
  Administered 2014-10-09: 650 mg via ORAL
  Filled 2014-10-09: qty 2

## 2014-10-09 MED ORDER — SODIUM CHLORIDE 0.9 % IV SOLN
10.0000 mg/kg | INTRAVENOUS | Status: DC
  Administered 2014-10-09: 600 mg via INTRAVENOUS
  Filled 2014-10-09: qty 60

## 2014-10-14 NOTE — Telephone Encounter (Signed)
Pt would like to have office visit with Dr. Oneida Alar before he calls and sets up the appt at [facility]. He is scheduled for 11/04/2014 at 10:30 Am with Dr. Oneida Alar to discuss his concerns.

## 2014-10-15 NOTE — Telephone Encounter (Signed)
REVIEWED-NO ADDITIONAL RECOMMENDATIONS. 

## 2014-11-04 ENCOUNTER — Encounter: Payer: Self-pay | Admitting: Gastroenterology

## 2014-11-04 ENCOUNTER — Other Ambulatory Visit: Payer: Self-pay

## 2014-11-04 ENCOUNTER — Ambulatory Visit (INDEPENDENT_AMBULATORY_CARE_PROVIDER_SITE_OTHER): Payer: Self-pay | Admitting: Gastroenterology

## 2014-11-04 ENCOUNTER — Telehealth: Payer: Self-pay

## 2014-11-04 VITALS — BP 108/64 | HR 67 | Temp 97.5°F | Ht 69.0 in | Wt 142.4 lb

## 2014-11-04 DIAGNOSIS — K50018 Crohn's disease of small intestine with other complication: Secondary | ICD-10-CM

## 2014-11-04 DIAGNOSIS — K50012 Crohn's disease of small intestine with intestinal obstruction: Secondary | ICD-10-CM

## 2014-11-04 MED ORDER — ONDANSETRON HCL 4 MG PO TABS: 4.0000 mg | ORAL_TABLET | Freq: Every day | ORAL | Status: DC | PRN

## 2014-11-04 MED ORDER — HYDROCODONE-ACETAMINOPHEN 5-325 MG PO TABS: ORAL_TABLET | ORAL | Status: DC

## 2014-11-04 NOTE — Telephone Encounter (Signed)
Pt's mom left VM that the pharmacy could not do the [employer], insurance will not cover, but will cover. Please fax in Rx for Ensure to [facility].

## 2014-11-04 NOTE — Patient Instructions (Addendum)
SEE [facility].  CONTINUE REMICADE.  USE CARNATION INSTANT BREAKFAST FOUR TIMES A DAY.  ZOFRAN AS NEEDED FOR NAUSEA/VOMTIING.  VICODIN AS NEEDED.  CUT BACK ON MARIJUANA USE.  FOLLOW UP IN 4 MOS.

## 2014-11-04 NOTE — Progress Notes (Addendum)
Subjective:    Patient ID: Joshua Robertson, male    DOB: 24-Dec-1975, 39 y.o.   MRN: [id number]  MUSE,ROCHELLE D., PA-C  HPI TOOK ABX FOR TOOTH INFECTION AND HAD REACTION: SORES IN MOUTH, RASH ON GROIN. WAS TAKING DECADRON. LOST DOWN TO 130 LBS 2 WEEKS AGO. NOW BACK UP TO 142 LBS. GETTING Q8 WEEK REMICADE. NAUSEA/VOMTIING: NO BLOOD, 2-3X/WEEK. NO MEDS AT HOME. BMs:  LAST FEW WEEKS #4-7, 2-4 DAYS A WEEK. FEELS LIKE FINGERS SWELLING AND ACHING, NECK, AND BACK. DISABILITY IS PENDING. ABD PAIN: SAME-DAILY(LASTS MINS TO HRS). INDIGESTION: SOUR BELCHES AND UPPER ABD GAS. THC AS MUCH AS HE CAN. NO ADDITIONAL SORES IN MOUITH OR RASHES ON HIS LEGS.   PT DENIES FEVER, CHILLS, HEMATOCHEZIA, HEMATEMESIS, melena,  CHEST PAIN, SHORTNESS OF BREATH, CHANGE IN BOWEL IN HABITS, problems swallowing, problems with sedation, OR heartburn.  Past Medical History  Diagnosis Date  . Helicobacter pylori gastritis 2009  . Spondyloarthropathy 2009 EROSIVE  . Sacroiliitis 2009  . Migraine headache     NONE SINCE AGE 45  . BMI between 19-24,adult JAN 2010 149 LBS  . Crohn's disease AUG 2009 NUR [place]    HB 9.4 FERRITIN 26  140 LBS; no serologies-pt can't afford test  . Hypertension    Past Surgical History  Procedure Laterality Date  . Colonoscopy    . Colonoscopy N/A 07/30/2013    stenotic IC VALVE. UNABLE TO INTUBATE ILEUM.   Allergies  Allergen Reactions  . Azathioprine Nausea And Vomiting  . Penicillins Swelling and Rash  . Pentasa [Mesalamine Er] Rash    Current Outpatient Prescriptions  Medication Sig Dispense Refill  . Alum & Mag Hydroxide-Simeth (MAGIC MOUTHWASH W/LIDOCAINE) SOLN Take 5 mLs by mouth every 6 (six) hours as needed for mouth pain.    . benazepril (LOTENSIN) 40 MG tablet Take 1 tablet (40 mg total) by mouth daily.    . cloNIDine (CATAPRES) 0.1 MG tablet Take 1 tablet (0.1 mg total) by mouth 2 (two) times daily.    . hydrochlorothiazide (MICROZIDE) 12.5 MG capsule Take 12.5 mg by mouth  daily.    Marland Kitchen HYDROcodone-acetaminophen (NORCO/VICODIN) 5-325 MG per tablet 1 OR 2 EVERY 4-6 H PRN FOR PAIN    . inFLIXimab (REMICADE) 100 MG injection Inject 700 mg into the vein every 8 (eight) weeks.     Marland Kitchen tolnaftate (TINACTIN) 1 % cream Apply to feet and groin at hs    . tolnaftate (TINACTIN) 1 % powder Apply to feet and groin each morning and mid day     Review of Systems     Objective:   Physical Exam  Constitutional: He is oriented to person, place, and time. He appears well-developed and well-nourished. No distress.  HENT:  Head: Normocephalic and atraumatic.  Mouth/Throat: Oropharynx is clear and moist. No oropharyngeal exudate.  Eyes: Pupils are equal, round, and reactive to light. No scleral icterus.  Neck: Normal range of motion. Neck supple.  Cardiovascular: Normal rate, regular rhythm and normal heart sounds.   Pulmonary/Chest: Effort normal and breath sounds normal. No respiratory distress.  Abdominal: Soft. Bowel sounds are normal. He exhibits no distension. There is tenderness. There is no rebound and no guarding.  MILD RUQ TTP  Musculoskeletal: He exhibits no edema.  Lymphadenopathy:    He has no cervical adenopathy.  Neurological: He is alert and oriented to person, place, and time.  NO  NEW FOCAL DEFICITS   Psychiatric: He has a normal mood and affect.  Vitals reviewed.  Assessment & Plan:

## 2014-11-04 NOTE — Telephone Encounter (Signed)
Rx has been faxed. Tried to call and the mail box is full and I could not leave a message.

## 2014-11-04 NOTE — Telephone Encounter (Signed)
PT MAY HAVE ENSURE QID #1 MO SUPPLY REFILL x 5. FAX RX TO PHARMACY.

## 2014-11-04 NOTE — Progress Notes (Signed)
Reminder placed in [facility]

## 2014-11-04 NOTE — Assessment & Plan Note (Addendum)
SYMPTOMS NOT IDEALLY CONTROLLED. UNINTENTIONAL WEIGHT LOSS-DOWN FROM 155 LBS TO 142 LBS TODAY. TOLERATING REMICADE. CAN'T TAKE IMURAN.  DISCUSSED PROCEDURE, BENEFITS, RISKS, AND MANAGEMENT OF ILEAL RESECTION. REFER TO [facility] GEN SURG. CONTINUE REMICADE. CIB QID ZOFRAN PRN VICODIN PRN CUT BACK ON THC. FOLLOW UP IN 4 MOS.

## 2014-11-05 NOTE — Progress Notes (Signed)
cc'ed to pcp °

## 2014-11-26 ENCOUNTER — Encounter (HOSPITAL_COMMUNITY): Payer: Self-pay

## 2014-11-26 ENCOUNTER — Emergency Department (HOSPITAL_COMMUNITY)
Admission: EM | Admit: 2014-11-26 | Discharge: 2014-11-26 | Disposition: A | Discharge status: Home / Self Care | Payer: Self-pay | Attending: Emergency Medicine | Admitting: Emergency Medicine

## 2014-11-26 ENCOUNTER — Emergency Department (HOSPITAL_COMMUNITY): Payer: Self-pay

## 2014-11-26 DIAGNOSIS — Z8619 Personal history of other infectious and parasitic diseases: Secondary | ICD-10-CM | POA: Insufficient documentation

## 2014-11-26 DIAGNOSIS — Z79899 Other long term (current) drug therapy: Secondary | ICD-10-CM | POA: Insufficient documentation

## 2014-11-26 DIAGNOSIS — I10 Essential (primary) hypertension: Secondary | ICD-10-CM | POA: Insufficient documentation

## 2014-11-26 DIAGNOSIS — K5090 Crohn's disease, unspecified, without complications: Secondary | ICD-10-CM | POA: Insufficient documentation

## 2014-11-26 DIAGNOSIS — Z880 Allergy status to penicillin: Secondary | ICD-10-CM | POA: Insufficient documentation

## 2014-11-26 DIAGNOSIS — Z720 Tobacco use: Secondary | ICD-10-CM | POA: Insufficient documentation

## 2014-11-26 DIAGNOSIS — G43909 Migraine, unspecified, not intractable, without status migrainosus: Secondary | ICD-10-CM | POA: Insufficient documentation

## 2014-11-26 DIAGNOSIS — Z8739 Personal history of other diseases of the musculoskeletal system and connective tissue: Secondary | ICD-10-CM | POA: Insufficient documentation

## 2014-11-26 LAB — COMPREHENSIVE METABOLIC PANEL
ALBUMIN: 4.3 g/dL (ref 3.5–5.0)
ALK PHOS: 62 U/L (ref 38–126)
ALT: 12 U/L — ABNORMAL LOW (ref 17–63)
AST: 17 U/L (ref 15–41)
Anion gap: 6 (ref 5–15)
BILIRUBIN TOTAL: 0.6 mg/dL (ref 0.3–1.2)
BUN: 20 mg/dL (ref 6–20)
CO2: 29 mmol/L (ref 22–32)
CREATININE: 1.01 mg/dL (ref 0.61–1.24)
Calcium: 9.3 mg/dL (ref 8.9–10.3)
Chloride: 102 mmol/L (ref 101–111)
GFR calc non Af Amer: >60 mL/min (ref >60)
GLUCOSE: 100 mg/dL — AB (ref 65–99)
POTASSIUM: 3.7 mmol/L (ref 3.5–5.1)
Sodium: 137 mmol/L (ref 135–145)
Total Protein: 8 g/dL (ref 6.5–8.1)

## 2014-11-26 LAB — CBC WITH DIFFERENTIAL/PLATELET
BASOS PCT: 0 % (ref 0–1)
Basophils Absolute: 0.1 10*3/uL (ref 0.0–0.1)
EOS PCT: 1 % (ref 0–5)
Eosinophils Absolute: 0.1 10*3/uL (ref 0.0–0.7)
HEMATOCRIT: 37.2 % — AB (ref 39.0–52.0)
Hemoglobin: 12.6 g/dL — ABNORMAL LOW (ref 13.0–17.0)
LYMPHS ABS: 2.5 10*3/uL (ref 0.7–4.0)
Lymphocytes Relative: 18 % (ref 12–46)
MCH: 26 pg (ref 26.0–34.0)
MCHC: 33.9 g/dL (ref 30.0–36.0)
MCV: 76.7 fL — AB (ref 78.0–100.0)
MONO ABS: 1.2 10*3/uL — AB (ref 0.1–1.0)
Monocytes Relative: 9 % (ref 3–12)
NEUTROS ABS: 9.9 10*3/uL — AB (ref 1.7–7.7)
Neutrophils Relative %: 72 % (ref 43–77)
PLATELETS: 389 10*3/uL (ref 150–400)
RBC: 4.85 MIL/uL (ref 4.22–5.81)
RDW: 16.1 % — AB (ref 11.5–15.5)
WBC: 13.7 10*3/uL — AB (ref 4.0–10.5)

## 2014-11-26 LAB — URINALYSIS, ROUTINE W REFLEX MICROSCOPIC
Bilirubin Urine: NEGATIVE
Glucose, UA: NEGATIVE mg/dL
Hgb urine dipstick: NEGATIVE
KETONES UR: 15 mg/dL — AB
LEUKOCYTES UA: NEGATIVE
NITRITE: NEGATIVE
PROTEIN: NEGATIVE mg/dL
Specific Gravity, Urine: 1.015 (ref 1.005–1.030)
Urobilinogen, UA: 0.2 mg/dL (ref 0.0–1.0)
pH: 6.5 (ref 5.0–8.0)

## 2014-11-26 MED ORDER — SODIUM CHLORIDE 0.9 % IV BOLUS (SEPSIS)
1000.0000 mL | Freq: Once | INTRAVENOUS | Status: AC
  Administered 2014-11-26: 1000 mL via INTRAVENOUS

## 2014-11-26 MED ORDER — HYDROCODONE-ACETAMINOPHEN 5-325 MG PO TABS: 1.0000 | ORAL_TABLET | Freq: Four times a day (QID) | ORAL | Status: DC | PRN

## 2014-11-26 MED ORDER — PREDNISONE 20 MG PO TABS: ORAL_TABLET | ORAL | Status: DC

## 2014-11-26 MED ORDER — ONDANSETRON HCL 4 MG/2ML IJ SOLN
4.0000 mg | Freq: Once | INTRAMUSCULAR | Status: AC
  Administered 2014-11-26: 4 mg via INTRAVENOUS
  Filled 2014-11-26: qty 2

## 2014-11-26 MED ORDER — MORPHINE SULFATE 4 MG/ML IJ SOLN
4.0000 mg | Freq: Once | INTRAMUSCULAR | Status: AC
  Administered 2014-11-26: 4 mg via INTRAVENOUS
  Filled 2014-11-26: qty 1

## 2014-11-26 MED ORDER — METHYLPREDNISOLONE SODIUM SUCC 125 MG IJ SOLR
125.0000 mg | Freq: Once | INTRAMUSCULAR | Status: AC
  Administered 2014-11-26: 125 mg via INTRAVENOUS
  Filled 2014-11-26: qty 2

## 2014-11-26 NOTE — ED Notes (Addendum)
Pt c/o abd pain with nausea and vomiting.  No diarrhea.  LBM was this morning and was normal per pt.  Reports history of crohn's.   Also reports has appt at [facility] in 2 weeks for evaluation of an intestinal blockage.

## 2014-11-26 NOTE — ED Provider Notes (Signed)
CSN: [id number]     Arrival date & time 11/26/14  1359 History   First MD Initiated Contact with Patient 11/26/14 1406     Chief Complaint  Patient presents with  . Abdominal Pain     (Consider location/radiation/quality/duration/timing/severity/associated sxs/prior Treatment) HPI Comments: Patient is a 39 year old male with past medical history of Crohn's disease diagnosed in 2012. Patient is currently taking Remicade and is followed by Dr. Oneida Alar. He presents today with a four-day history of worsening generalized abdominal pain, loose stool, and decreased by mouth intake. He reports some nausea and vomiting. He tells me he has a history of a "blockage" and is due to see a Psychologist, sport and exercise at [facility] in 2 weeks. This was apparently arranged by Dr. Oneida Alar.  Patient is a 39 y.o. male presenting with abdominal pain. The history is provided by the patient.  Abdominal Pain Pain location:  Generalized Pain quality: cramping   Pain radiates to:  Does not radiate Pain severity:  Moderate Onset quality:  Gradual Duration:  4 days Timing:  Constant Progression:  Worsening Chronicity:  Recurrent Relieved by:  Nothing Worsened by:  Nothing tried Ineffective treatments:  None tried Associated symptoms: no chills, no diarrhea and no fever     Past Medical History  Diagnosis Date  . Helicobacter pylori gastritis 2009  . Spondyloarthropathy 2009 EROSIVE  . Sacroiliitis 2009  . Migraine headache     NONE SINCE AGE 24  . BMI between 19-24,adult JAN 2010 149 LBS  . Crohn's disease AUG 2009 NUR [place]    HB 9.4 FERRITIN 26  140 LBS; no serologies-pt can't afford test  . Hypertension    Past Surgical History  Procedure Laterality Date  . Colonoscopy    . Colonoscopy N/A 07/30/2013    stenotic IC VALVE. UNABLE TO INTUBATE ILEUM.   Family History  Problem Relation Age of Onset  . Colon cancer Neg Hx   . Colon polyps Neg Hx   . Inflammatory bowel disease Neg Hx   . Hypertension Mother   .  Hypertension Father    History  Substance Use Topics  . Smoking status: Current Every Day Smoker -- 0.50 packs/day    Types: Cigarettes  . Smokeless tobacco: Never Used  . Alcohol Use: No    Review of Systems  Constitutional: Negative for fever and chills.  Gastrointestinal: Positive for abdominal pain. Negative for diarrhea.  All other systems reviewed and are negative.     Allergies  Azathioprine; Penicillins; and Pentasa  Home Medications   Prior to Admission medications   Medication Sig Start Date End Date Taking? Authorizing Provider  benazepril (LOTENSIN) 40 MG tablet Take 1 tablet (40 mg total) by mouth daily. 06/02/11  Yes Dorie Rank, MD  cloNIDine (CATAPRES) 0.1 MG tablet Take 1 tablet (0.1 mg total) by mouth 2 (two) times daily. 08/09/12  Yes [name], MD  hydrochlorothiazide (MICROZIDE) 12.5 MG capsule Take 12.5 mg by mouth daily.   Yes Historical Provider, MD  inFLIXimab (REMICADE) 100 MG injection Inject 700 mg into the vein every 8 (eight) weeks.    Yes Historical Provider, MD  Nutritional Supplements (CARNATION INSTANT BREAKFAST PO) Take 1 Can by mouth 3 (three) times daily.   Yes Historical Provider, MD  ondansetron (ZOFRAN) 4 MG tablet Take 1 tablet (4 mg total) by mouth daily as needed for nausea or vomiting. 11/04/14  Yes [name], MD  tolnaftate (TINACTIN) 1 % cream Apply to feet and groin at hs 10/07/14  Yes Lily Kocher, PA-C  HYDROcodone-acetaminophen (NORCO/VICODIN) 5-325 MG per tablet 1 OR 2 EVERY 4-6 H PRN FOR PAIN Patient not taking: Reported on 11/26/2014 11/04/14   [name], MD  tolnaftate (TINACTIN) 1 % powder Apply to feet and groin each morning and mid day Patient not taking: Reported on 11/26/2014 10/07/14   Lily Kocher, PA-C   BP 118/74 mmHg  Pulse 101  Temp(Src) 98.7 F (37.1 C) (Oral)  Resp 18  Ht 5' 9.5" (1.765 m)  Wt 131 lb 4.8 oz (59.557 kg)  BMI 19.12 kg/m2  SpO2 100% Physical Exam  Constitutional: He is oriented to  person, place, and time. He appears well-developed and well-nourished. No distress.  Awake, alert, nontoxic appearance.  HENT:  Head: Normocephalic and atraumatic.  Mouth/Throat: Oropharynx is clear and moist.  Eyes: Right eye exhibits no discharge. Left eye exhibits no discharge.  Neck: Normal range of motion. Neck supple.  Cardiovascular: Normal rate, regular rhythm and normal heart sounds.   No murmur heard. Pulmonary/Chest: Effort normal and breath sounds normal. No respiratory distress. He has no wheezes. He exhibits no tenderness.  Abdominal: Soft. Bowel sounds are normal. He exhibits no distension. There is tenderness. There is no rebound.  There is tenderness to palpation in the umbilical region.  Musculoskeletal: Normal range of motion. He exhibits no edema or tenderness.  Baseline ROM, no obvious new focal weakness.  Lymphadenopathy:    He has no cervical adenopathy.  Neurological: He is alert and oriented to person, place, and time.  Mental status and motor strength appears baseline for patient and situation.  Skin: Skin is warm and dry. No rash noted. He is not diaphoretic.  Psychiatric: He has a normal mood and affect.  Nursing note and vitals reviewed.   ED Course  Procedures (including critical care time) Labs Review Labs Reviewed  CBC WITH DIFFERENTIAL/PLATELET - Abnormal; Notable for the following:    WBC 13.7 (*)    Hemoglobin 12.6 (*)    HCT 37.2 (*)    MCV 76.7 (*)    RDW 16.1 (*)    Neutro Abs 9.9 (*)    Monocytes Absolute 1.2 (*)    All other components within normal limits  URINALYSIS, ROUTINE W REFLEX MICROSCOPIC (NOT AT [facility]) - Abnormal; Notable for the following:    Ketones, ur 15 (*)    All other components within normal limits  COMPREHENSIVE METABOLIC PANEL    Imaging Review No results found.   EKG Interpretation None      MDM   Final diagnoses:  None    Patient is a 39 year old male with history of Crohn's who presents today with an  apparent flareup of this. He is having abdominal pain, nausea, and vomiting. He has not had any bloody stool or fever. He is feeling better with fluids, steroids, and pain medication and I believe is appropriate for discharge. His obstruction series reveals no evidence for SBO.    Veryl Speak, MD 11/26/14 [phone]

## 2014-11-26 NOTE — ED Notes (Signed)
MD at bedside. 

## 2014-11-26 NOTE — Discharge Instructions (Signed)
Prednisone as prescribed.  Hydrocodone as prescribed as needed for pain.  Follow-up with Dr. Oneida Alar in the next week and return to the ER if symptoms significantly worsen or change.   Crohn Disease Crohn disease is a long-term (chronic) soreness and redness (inflammation) of the intestines (bowel). It can affect any portion of the digestive tract, from the mouth to the anus. It can also cause problems outside the digestive tract. Crohn disease is closely related to a disease called ulcerative colitis (together, these two diseases are called inflammatory bowel disease).  CAUSES  The cause of Crohn disease is not known. One Link Snuffer is that, in an easily affected person, the immune system is triggered to attack the body's own digestive tissue. Crohn disease runs in families. It seems to be more common in certain geographic areas and amongst certain races. There are no clear-cut dietary causes.  SYMPTOMS  Crohn disease can cause many different symptoms since it can affect many different parts of the body. Symptoms include:  Fatigue.  Weight loss.  Chronic diarrhea, sometime bloody.  Abdominal pain and cramps.  Fever.  Ulcers or canker sores in the mouth or rectum.  Anemia (low red blood cells).  Arthritis, skin problems, and eye problems may occur. Complications of Crohn disease can include:  Series of holes (perforation) of the bowel.  Portions of the intestines sticking to each other (adhesions).  Obstruction of the bowel.  Fistula formation, typically in the rectal area but also in other areas. A fistula is an opening between the bowels and the outside, or between the bowels and another organ.  A painful crack in the mucous membrane of the anus (rectal fissure). DIAGNOSIS  Your caregiver may suspect Crohn disease based on your symptoms and an exam. Blood tests may confirm that there is a problem. You may be asked to submit a stool specimen for examination. X-rays and CT scans  may be necessary. Ultimately, the diagnosis is usually made after a procedure that uses a flexible tube that is inserted via your mouth or your anus. This is done under sedation and is called either an upper endoscopy or colonoscopy. With these tests, the specialist can take tiny tissue samples and remove them from the inside of the bowel (biopsy). Examination of this biopsy tissue under a microscope can reveal Crohn disease as the cause of your symptoms. Due to the many different forms that Crohn disease can take, symptoms may be present for several years before a diagnosis is made. TREATMENT  Medications are often used to decrease inflammation and control the immune system. These include medicines related to aspirin, steroid medications, and newer and stronger medications to slow down the immune system. Some medications may be used as suppositories or enemas. A number of other medications are used or have been studied. Your caregiver will make specific recommendations. HOME CARE INSTRUCTIONS   Symptoms such as diarrhea can be controlled with medications. Avoid foods that have a laxative effect such as fresh fruit, vegetables, and dairy products. During flare-ups, you can rest your bowel by refraining from solid foods. Drink clear liquids frequently during the day. (Electrolyte or rehydrating fluids are best. Your caregiver can help you with suggestions.) Drink often to prevent loss of body fluids (dehydration). When diarrhea has cleared, eat small meals and more frequently. Avoid food additives and stimulants such as caffeine (coffee, tea, or chocolate). Enzyme supplements may help if you develop intolerance to a sugar in dairy products (lactose). Ask your caregiver or dietitian about  specific dietary instructions.  Try to maintain a positive attitude. Learn relaxation techniques such as self-hypnosis, mental imaging, and muscle relaxation.  If possible, avoid stresses which can aggravate your  condition.  Exercise regularly.  Follow your diet.  Always get plenty of rest. SEEK MEDICAL CARE IF:   Your symptoms fail to improve after a week or two of new treatment.  You experience continued weight loss.  You have ongoing cramps or loose bowels.  You develop a new skin rash, skin sores, or eye problems. SEEK IMMEDIATE MEDICAL CARE IF:   You have worsening of your symptoms or develop new symptoms.  You have a fever.  You develop bloody diarrhea.  You develop severe abdominal pain. MAKE SURE YOU:   Understand these instructions.  Will watch your condition.  Will get help right away if you are not doing well or get worse. Document Released: 03/10/2005 Document Revised: 10/15/2013 Document Reviewed: 02/06/2007 [facility] Patient Information 2015 [place], [place]. This information is not intended to replace advice given to you by your health care provider. Make sure you discuss any questions you have with your health care provider.

## 2014-12-03 ENCOUNTER — Encounter (HOSPITAL_COMMUNITY)
Admission: RE | Admit: 2014-12-03 | Discharge: 2014-12-03 | Disposition: A | Discharge status: Home / Self Care | Payer: Self-pay | Source: Ambulatory Visit | Attending: Gastroenterology | Admitting: Gastroenterology

## 2014-12-03 MED ORDER — ACETAMINOPHEN 325 MG PO TABS: 650.0000 mg | ORAL_TABLET | Freq: Once | ORAL | Status: DC

## 2014-12-03 MED ORDER — SODIUM CHLORIDE 0.9 % IV SOLN: INTRAVENOUS | Status: DC

## 2014-12-03 MED ORDER — LORATADINE 10 MG PO TABS
10.0000 mg | ORAL_TABLET | Freq: Every day | ORAL | Status: DC
Start: 2014-12-03 — End: 2014-12-04

## 2014-12-03 MED ORDER — METHYLPREDNISOLONE SODIUM SUCC 125 MG IJ SOLR: 40.0000 mg | Freq: Once | INTRAMUSCULAR | Status: DC

## 2014-12-03 MED ORDER — SODIUM CHLORIDE 0.9 % IV SOLN
10.0000 mg/kg | Freq: Once | INTRAVENOUS | Status: DC
  Filled 2014-12-03: qty 60

## 2014-12-04 ENCOUNTER — Encounter (HOSPITAL_COMMUNITY)
Admission: RE | Admit: 2014-12-04 | Discharge: 2014-12-04 | Disposition: A | Discharge status: Home / Self Care | Payer: Self-pay | Source: Ambulatory Visit | Attending: Gastroenterology | Admitting: Gastroenterology

## 2014-12-04 ENCOUNTER — Encounter (HOSPITAL_COMMUNITY): Payer: Self-pay

## 2014-12-04 DIAGNOSIS — K5090 Crohn's disease, unspecified, without complications: Secondary | ICD-10-CM | POA: Insufficient documentation

## 2014-12-04 MED ORDER — ACETAMINOPHEN 325 MG PO TABS
650.0000 mg | ORAL_TABLET | Freq: Once | ORAL | Status: AC
  Administered 2014-12-04: 650 mg via ORAL
  Filled 2014-12-04: qty 2

## 2014-12-04 MED ORDER — METHYLPREDNISOLONE SODIUM SUCC 125 MG IJ SOLR
40.0000 mg | Freq: Once | INTRAMUSCULAR | Status: AC
  Administered 2014-12-04: 40 mg via INTRAVENOUS
  Filled 2014-12-04: qty 2

## 2014-12-04 MED ORDER — LORATADINE 10 MG PO TABS
10.0000 mg | ORAL_TABLET | Freq: Once | ORAL | Status: AC
  Administered 2014-12-04: 10 mg via ORAL
  Filled 2014-12-04: qty 1

## 2014-12-04 MED ORDER — SODIUM CHLORIDE 0.9 % IV SOLN: INTRAVENOUS | Status: DC

## 2014-12-04 MED ORDER — SODIUM CHLORIDE 0.9 % IV SOLN
10.0000 mg/kg | Freq: Once | INTRAVENOUS | Status: AC
  Administered 2014-12-04: 600 mg via INTRAVENOUS
  Filled 2014-12-04: qty 60

## 2014-12-05 ENCOUNTER — Telehealth: Payer: Self-pay

## 2014-12-05 MED ORDER — HYDROCODONE-ACETAMINOPHEN 5-325 MG PO TABS: 1.0000 | ORAL_TABLET | Freq: Four times a day (QID) | ORAL | Status: DC | PRN

## 2014-12-05 NOTE — Telephone Encounter (Signed)
Pt called and states that he needs a refill for his hydrocodone because his appt. At [facility] has gotten postponed 1 month. Wants to know if he can have a one called to his pharmacy

## 2014-12-05 NOTE — Telephone Encounter (Signed)
PLEASE CALL PT. HE CAN PICK UP RX.

## 2014-12-06 NOTE — Telephone Encounter (Signed)
Called pt and he is aware to come pick up RX here at the office

## 2014-12-27 ENCOUNTER — Telehealth: Payer: Self-pay

## 2014-12-27 NOTE — Telephone Encounter (Signed)
Pt called and is aware of appt at [facility] on 01/20/2015 @ 11:15 am. Pt states he was wondering if he could have some more pain medication. States he only has 3 days worth left.

## 2014-12-31 ENCOUNTER — Telehealth: Payer: Self-pay | Admitting: Gastroenterology

## 2014-12-31 NOTE — Telephone Encounter (Signed)
PATIENT CALLING WITH QUESTION ABOUT PAIN MEDS  PLEASE CALL AT [phone]

## 2014-12-31 NOTE — Telephone Encounter (Signed)
Pt has called back and asked about the prescription for pain meds. I told him that Dr. Oneida Alar is at the hospital but I will send her a message.

## 2014-12-31 NOTE — Telephone Encounter (Signed)
See note dated 12/27/2014.

## 2015-01-01 MED ORDER — HYDROCODONE-ACETAMINOPHEN 5-325 MG PO TABS: 1.0000 | ORAL_TABLET | Freq: Four times a day (QID) | ORAL | Status: DC | PRN

## 2015-01-01 NOTE — Telephone Encounter (Signed)
Rx signed and UP FRONT.

## 2015-01-01 NOTE — Telephone Encounter (Signed)
LMOM that Rx is ready for pickup.

## 2015-01-13 ENCOUNTER — Encounter: Payer: Self-pay | Admitting: Gastroenterology

## 2015-01-21 ENCOUNTER — Telehealth: Payer: Self-pay

## 2015-01-21 NOTE — Telephone Encounter (Signed)
PLEASE CALL PT. HIS RX FOR VICODIN WAS WRITTEN ON JUL 20. I WILL ONLY RX #30 PILLS PER 30 DAYS. IF HE NEED CHRONIC NARCOTICS HE WILL NEED TO SEE A PAIN SPECIALIST.

## 2015-01-21 NOTE — Telephone Encounter (Signed)
Pt left Vm that he needs a prescription for the pain medicine.

## 2015-01-21 NOTE — Telephone Encounter (Signed)
Pt called again and asked about his prescription for pain meds.

## 2015-01-22 ENCOUNTER — Other Ambulatory Visit: Payer: Self-pay

## 2015-01-22 DIAGNOSIS — M545 Low back pain: Secondary | ICD-10-CM

## 2015-01-22 DIAGNOSIS — R109 Unspecified abdominal pain: Secondary | ICD-10-CM

## 2015-01-22 NOTE — Telephone Encounter (Signed)
Referral made to [facility]

## 2015-01-22 NOTE — Telephone Encounter (Signed)
I called and informed pt. He said if that is what has to be done, OK to refer to pain specialist.

## 2015-01-23 ENCOUNTER — Encounter: Payer: Self-pay | Admitting: Gastroenterology

## 2015-01-23 ENCOUNTER — Ambulatory Visit (INDEPENDENT_AMBULATORY_CARE_PROVIDER_SITE_OTHER): Payer: Self-pay | Admitting: Gastroenterology

## 2015-01-23 VITALS — BP 105/66 | HR 68 | Temp 98.6°F | Ht 69.5 in | Wt 144.0 lb

## 2015-01-23 DIAGNOSIS — K50018 Crohn's disease of small intestine with other complication: Secondary | ICD-10-CM

## 2015-01-23 MED ORDER — HYDROCODONE-ACETAMINOPHEN 5-325 MG PO TABS: 1.0000 | ORAL_TABLET | Freq: Four times a day (QID) | ORAL | Status: DC | PRN

## 2015-01-23 NOTE — Progress Notes (Signed)
cc'ed to pcp °

## 2015-01-23 NOTE — Assessment & Plan Note (Signed)
SYMPTOMS NOT IDEALLY CONTROLLED. CONTINUES TO REQUIRE VICODIN.  CONTINUE REMICADE. PAIN CLINIC REFERRAL MADE. EXPLAINED WHY HE NEEDS TO SEE THEM. VICODIN #[id number] MODIFY DIET SBFT AFTER PT GETS [facility] ASSISTANCE REVIEWED OPV FROM [facility] AND XRAY FROM 2010 TO PRESENT FOLLOW UP IN DEC 2016.  GREATER THAN 50% WAS SPENT IN COUNSELING & COORDINATION OF CARE WITH THE PATIENT: DISCUSSED UNDERLYING PATHOPHYSIOLOGY, SBFT PROCEDURE, BENEFITS & RISKS SURGERY, AND MANAGEMENT OF CROHN'S DISEASE. TOTAL ENCOUNTER TIME: 25 MINS.

## 2015-01-23 NOTE — Progress Notes (Signed)
ON RECALL  °

## 2015-01-23 NOTE — Patient Instructions (Signed)
CONTINUE REMICADE.  MAKE AN APPOINTMENT WITH THE PAIN CLINIC.  CONTINUE TO MODIFY YOUR DIET. SEE INFO BELOW ON LOW RESIDUE DIET.  COMPLETE YOUR SMALL BOWEL FOLLOW THROUGH WHEN YOU GET [facility] ASSISTANCE.  FOLLOW UP IN DEC 2016.  FOLLOW UP WITH [facility] IN JAN 2017.  Low Fiber and Residue Restricted Diet A low fiber diet restricts foods that contain carbohydrates that are not digested in the small intestine. A diet containing about 10 g of fiber is considered low fiber. The diet needs to be individualized to suit patient tolerances and preferences and to avoid unnecessary restrictions. Generally, the foods emphasized in a low fiber diet have no skins or seeds. They may have been processed to remove bran, germ, or husks. Cooking may not necessarily eliminate the fiber. Cooking may, in fact, enable a greater quantity of fiber to be consumed in a lesser volume. Legumes and nuts are also restricted. The term low residue has also been used to describe low fiber diets, although the two are not the same. Residue refers to any substance that adds to bowel (colonic) contents, such as sloughed cells and intestinal bacteria, in addition to fiber. Residue-containing foods, prunes and prune juice, milk, and connective tissue from meats may also need to be eliminated. It is important to eliminate these foods during sudden (acute) attacks of inflammatory bowel disease, when there is a partial obstruction due to another reason, or when minimal fecal output is desired. When these problems are gone, a more normal diet may be used.  PURPOSE  Reduce stool weight and volume.   CHOOSING FOODS Check labels, especially on foods from the starch list. Often, dietary fiber content is listed with the Nutrition Facts panel.  Breads and Starches  Allowed: White, [place], and pita breads, plain rolls, buns, or sweet rolls, doughnuts, waffles, pancakes, bagels. Plain muffins, sweet breads, biscuits, matzoth. Flour. Soda, saltine,  or graham crackers. Pretzels, rusks, melba toast, zwieback. Cooked cereals: cornmeal, farina, cream cereals. Dry cereals: refined corn, wheat, rice, and oat cereals (check label). Potatoes prepared any way without skins, refined macaroni, spaghetti, noodles, refined rice.   Avoid: Bread, rolls, or crackers made with whole-wheat, multigrains, rye, bran seeds, nuts, or coconut. Corn tortillas, table-shells. Corn chips, tortilla chips. Cereals containing whole-grains, multigrains, bran, coconut, nuts, or raisins. Cooked or dry oatmeal. Coarse wheat cereals, granola. Cereals advertised as "high fiber." Potato skins. Whole-grain pasta, wild or brown rice. Popcorn.  Vegetables  Allowed:  Strained tomato and vegetable juices. Fresh: tender lettuce, cucumber, cabbage, spinach, bean sprouts. Cooked, canned: asparagus, bean sprouts, cut green or wax beans, cauliflower, pumpkin, beets, mushrooms, olives, spinach, yellow squash, tomato, tomato sauce (no seeds), zucchini (peeled), turnips. Canned sweet potatoes. Small amounts of celery, onion, radish, and green pepper may be used. Keep servings limited to  cup.   Avoid: Fresh, cooked, or canned: artichokes, baked beans, beet greens, broccoli, Brussels sprouts, French-style green beans, corn, kale, legumes, peas, sweet potatoes. Cooked: green or red cabbage, spinach. Avoid large servings of any vegetables.  Fruit  Allowed:  All fruit juices except prune juice. Cooked or canned: apricots applesauce, cantaloupe, cherries, grapefruit, grapes, kiwi, mandarin oranges, peaches, pears, fruit cocktail, pineapple, plums, watermelon. Fresh: banana, grapes, cantaloupe, avocado, cherries, pineapple, grapefruit, kiwi, nectarines, peaches, oranges, blueberries, plums. Keep servings limited to  cup or 1 piece.   Avoid: Fresh: apple with or without skin, apricots, mango, pears, raspberries, strawberries. Prune juice, stewed or dried prunes. Dried fruits, raisins, dates. Avoid large  servings of all  fresh fruits.  Meat and Meat Substitutes  Allowed:  Ground or well-cooked tender beef, ham, veal, lamb, pork, or poultry. Eggs, plain cheese. Fish, oysters, shrimp, lobster, other seafood. Liver, organ meats.   Avoid: Tough, fibrous meats with gristle. Peanut butter, smooth or chunky. Cheese with seeds, nuts, or other foods not allowed. Nuts, seeds, legumes, dried peas, beans, lentils.  Milk  Allowed:  All milk products except those not allowed. Milk and milk product consumption should be minimal when low residue is desired.   Avoid: Yogurt that contains nuts or seeds.  Soups and Combination Foods  Allowed:  Bouillon, broth, or cream soups made from allowed foods. Any strained soup. Casseroles or mixed dishes made with allowed foods.   Avoid: Soups made from vegetables that are not allowed or that contain other foods not allowed.  Desserts and Sweets  Allowed:  Plain cakes and cookies, pie made with allowed fruit, pudding, custard, cream pie. Gelatin, fruit, ice, sherbet, frozen ice pops. Ice cream, ice milk without nuts. Plain hard candy, honey, jelly, molasses, syrup, sugar, chocolate syrup, gumdrops, marshmallows.   Avoid: Desserts, cookies, or candies that contain nuts, peanut butter, or dried fruits. Jams, preserves with seeds, marmalade.  Fats and Oils  Allowed:  Margarine, butter, cream, mayonnaise, salad oils, plain salad dressings made from allowed foods. Plain gravy, crisp bacon without rind.   Avoid: Seeds, nuts, olives. Avocados.  Beverages  Allowed:  All, except those listed to avoid.   Avoid: Fruit juices with high pulp, prune juice.  Condiments  Allowed:  Ketchup, mustard, horseradish, vinegar, cream sauce, cheese sauce, cocoa powder. Spices in moderation: allspice, basil, bay leaves, celery powder or leaves, cinnamon, cumin powder, curry powder, ginger, mace, marjoram, onion or garlic powder, oregano, paprika, parsley flakes, ground pepper, rosemary,  sage, savory, tarragon, thyme, turmeric.   Avoid: Coconut, pickles.    SAMPLE MEAL PLAN The following menu is provided as a sample. Your daily menu plans will vary. Be sure to include a minimum of the following each day in order to provide essential nutrients for the adult:  Starch/Bread/Cereal Group, 6 servings.   Fruit/Vegetable Group, 5 servings.   Meat/Meat Substitute Group, 2 servings.   Milk/Milk Substitute Group, 2 servings.  A serving is equal to  cup for fruits, vegetables, and cooked cereals or 1 piece for foods such as a piece of bread, 1 orange, or 1 apple. For dry cereals and crackers, use serving sizes listed on the label. Combination foods may count as full or partial servings from various food groups. Fats, desserts, and sweets may be added to the meal plan after the requirements for essential nutrients are met. SAMPLE MENU Breakfast   cup orange juice.   1 boiled egg.   1 slice white toast.   Margarine.    cup cornflakes.   1 cup milk.   Beverage.  Lunch   cup chicken noodle soup.   2 to 3 oz sliced roast beef.   2 slices seedless rye bread.   Mayonnaise.    cup tomato juice.   1 small banana.   Beverage.  Dinner  3 oz baked chicken.    cup scalloped potatoes.    cup cooked beets.   White dinner roll.   Margarine.    cup canned peaches.   Beverage.

## 2015-01-23 NOTE — Telephone Encounter (Signed)
REVIEWED-NO ADDITIONAL RECOMMENDATIONS. 

## 2015-01-23 NOTE — Progress Notes (Signed)
   Subjective:    Patient ID: Joshua Robertson, male    DOB: [birth year], 39 y.o.   MRN: [id number]  MUSE,ROCHELLE D., PA-C  HPI Pt seen at [facility]. They want to delay surgery for 6 mos UNLESS HE STARTS LOSING WEIGHT OR UNABLE TO HAVE A BM. HAD ONE THIS AM: #1 OR #5. MAY BE #3/6. Getting Remicade every 8 weeks. IT'S BETTER THAN HUMIRA.  RARE NAUSEA. PAIN IN BETWEEN 8-9 RANGE. HURTS IN LOWER ABDOMEN AND AROUND NAVEL.   PT DENIES FEVER, CHILLS, HEMATOCHEZIA, vomiting, melena, diarrhea, CHEST PAIN, SHORTNESS OF BREATH,  CHANGE IN BOWEL IN HABITS, problems swallowing, OR heartburn or indigestion.'   Past Medical History  Diagnosis Date  . Helicobacter pylori gastritis 2009  . Spondyloarthropathy 2009 EROSIVE  . Sacroiliitis 2009  . Migraine headache     NONE SINCE AGE 56  . BMI between 19-24,adult JAN 2010 149 LBS  . Crohn's disease AUG 2009 NUR [facility]    HB 9.4 FERRITIN 26  140 LBS; no serologies-pt can't afford test  . Hypertension     Past Surgical History  Procedure Laterality Date  . Colonoscopy    . Colonoscopy N/A 07/30/2013    stenotic IC VALVE. UNABLE TO INTUBATE ILEUM.    Allergies  Allergen Reactions  . Azathioprine Nausea And Vomiting  . Penicillins Swelling and Rash  . Pentasa [Mesalamine Er] Rash   Current Outpatient Prescriptions  Medication Sig Dispense Refill  . benazepril (LOTENSIN) 40 MG tablet Take 1 tablet (40 mg total) by mouth daily.    . cloNIDine (CATAPRES) 0.1 MG tablet Take 1 tablet (0.1 mg total) by mouth 2 (two) times daily.    . hydrochlorothiazide (MICROZIDE) 12.5 MG capsule Take 12.5 mg by mouth daily.    Marland Kitchen inFLIXimab (REMICADE) 100 MG injection Inject 700 mg into the vein every 8 (eight) weeks.     . Nutritional Supplements (CARNATION INSTANT BREAKFAST PO) Take 1 Can by mouth 3 (three) times daily.    Marland Kitchen tolnaftate (TINACTIN) 1 % cream Apply to feet and groin at hs    . HYDROcodone-acetaminophen (NORCO) 5-325 MG per tablet Take 1-2 tablets by mouth  every 6 (six) hours as needed.     .      .      .        Review of Systems PER HPI OTHERWISE ALL SYSTEMS ARE NEGATIVE.     Objective:   Physical Exam  Constitutional: He is oriented to person, place, and time. He appears well-developed and well-nourished. No distress.  HENT:  Head: Normocephalic and atraumatic.  Mouth/Throat: Oropharynx is clear and moist. No oropharyngeal exudate.  EYES APPEAR GLASSY AND RED  Eyes: Pupils are equal, round, and reactive to light. No scleral icterus.  Neck: Normal range of motion. Neck supple.  Cardiovascular: Normal rate, regular rhythm and normal heart sounds.   Pulmonary/Chest: Effort normal and breath sounds normal. No respiratory distress.  Abdominal: Soft. Bowel sounds are normal. He exhibits no distension. There is tenderness. There is guarding (VOLUNTARY). There is no rebound.  MILD BLQs TTP   Musculoskeletal: He exhibits no edema.  Lymphadenopathy:    He has no cervical adenopathy.  Neurological: He is alert and oriented to person, place, and time.  NO FOCAL DEFICITS   Psychiatric: He has a normal mood and affect.  Vitals reviewed.         Assessment & Plan:

## 2015-01-29 ENCOUNTER — Encounter (HOSPITAL_COMMUNITY)
Admission: RE | Admit: 2015-01-29 | Discharge: 2015-01-29 | Disposition: A | Discharge status: Home / Self Care | Payer: Self-pay | Source: Ambulatory Visit | Attending: Gastroenterology | Admitting: Gastroenterology

## 2015-01-29 DIAGNOSIS — K5090 Crohn's disease, unspecified, without complications: Secondary | ICD-10-CM | POA: Insufficient documentation

## 2015-01-29 MED ORDER — LORATADINE 10 MG PO TABS
ORAL_TABLET | ORAL | Status: AC
  Filled 2015-01-29: qty 1

## 2015-01-29 MED ORDER — ACETAMINOPHEN 325 MG PO TABS
ORAL_TABLET | ORAL | Status: AC
  Filled 2015-01-29: qty 2

## 2015-01-29 MED ORDER — METHYLPREDNISOLONE SODIUM SUCC 125 MG IJ SOLR
40.0000 mg | Freq: Once | INTRAMUSCULAR | Status: AC
  Administered 2015-01-29: 40 mg via INTRAVENOUS
  Filled 2015-01-29: qty 2

## 2015-01-29 MED ORDER — SODIUM CHLORIDE 0.9 % IV SOLN
INTRAVENOUS | Status: DC
  Administered 2015-01-29: 09:00:00 via INTRAVENOUS

## 2015-01-29 MED ORDER — LORATADINE 10 MG PO TABS
10.0000 mg | ORAL_TABLET | Freq: Every day | ORAL | Status: DC
  Administered 2015-01-29: 10 mg via ORAL

## 2015-01-29 MED ORDER — ACETAMINOPHEN 325 MG PO TABS
650.0000 mg | ORAL_TABLET | Freq: Once | ORAL | Status: AC
  Administered 2015-01-29: 650 mg via ORAL

## 2015-01-29 MED ORDER — SODIUM CHLORIDE 0.9 % IV SOLN
10.0000 mg/kg | INTRAVENOUS | Status: DC
  Administered 2015-01-29: 700 mg via INTRAVENOUS
  Filled 2015-01-29: qty 70

## 2015-02-24 ENCOUNTER — Telehealth: Payer: Self-pay

## 2015-02-24 NOTE — Telephone Encounter (Signed)
Pt left Vm requesting Rx for pain med. I called him at [phone] and told him Dr. Oneida Alar is off today. I will send her a message for tomorrow.

## 2015-02-25 NOTE — Telephone Encounter (Signed)
PLEASE CALL PT. I TOLD HIM AT HIS VISIT THAT HE NEEDED TO GET ADDITIONAL PAIN MEDS FROM THE PAIN CLINIC. THE REFERRAL TO THE PAIN CLINIC WAS MADE ONE MONTH AGO.

## 2015-02-25 NOTE — Telephone Encounter (Signed)
I called and informed pt. He said that he could not afford to go to the pain clinic. The first visit would cost $450.00 and each additional visit would cost $350.00.  He just wants me to let Dr. Oneida Alar know and see if he has any other options.

## 2015-02-25 NOTE — Telephone Encounter (Signed)
Pt's mom came by the office to pick up RX for pain. I told her I had sent the message to Dr. Oneida Alar and she is doing procedures at the hospital. I will call them when I hear from her.

## 2015-03-04 ENCOUNTER — Encounter (HOSPITAL_COMMUNITY): Payer: Self-pay | Admitting: Emergency Medicine

## 2015-03-04 ENCOUNTER — Emergency Department (HOSPITAL_COMMUNITY)
Admission: EM | Admit: 2015-03-04 | Discharge: 2015-03-04 | Disposition: A | Discharge status: Home / Self Care | Payer: Self-pay | Attending: Emergency Medicine | Admitting: Emergency Medicine

## 2015-03-04 DIAGNOSIS — Z8619 Personal history of other infectious and parasitic diseases: Secondary | ICD-10-CM | POA: Insufficient documentation

## 2015-03-04 DIAGNOSIS — Z880 Allergy status to penicillin: Secondary | ICD-10-CM | POA: Insufficient documentation

## 2015-03-04 DIAGNOSIS — Z8719 Personal history of other diseases of the digestive system: Secondary | ICD-10-CM | POA: Insufficient documentation

## 2015-03-04 DIAGNOSIS — I10 Essential (primary) hypertension: Secondary | ICD-10-CM | POA: Insufficient documentation

## 2015-03-04 DIAGNOSIS — Z79899 Other long term (current) drug therapy: Secondary | ICD-10-CM | POA: Insufficient documentation

## 2015-03-04 DIAGNOSIS — Z720 Tobacco use: Secondary | ICD-10-CM | POA: Insufficient documentation

## 2015-03-04 DIAGNOSIS — M436 Torticollis: Secondary | ICD-10-CM | POA: Insufficient documentation

## 2015-03-04 MED ORDER — HYDROCODONE-ACETAMINOPHEN 5-325 MG PO TABS: ORAL_TABLET | ORAL | Status: DC

## 2015-03-04 MED ORDER — OXYCODONE-ACETAMINOPHEN 5-325 MG PO TABS
1.0000 | ORAL_TABLET | Freq: Once | ORAL | Status: AC
  Administered 2015-03-04: 1 via ORAL
  Filled 2015-03-04: qty 1

## 2015-03-04 MED ORDER — METHOCARBAMOL 500 MG PO TABS: 500.0000 mg | ORAL_TABLET | Freq: Three times a day (TID) | ORAL | Status: DC

## 2015-03-04 MED ORDER — IBUPROFEN 800 MG PO TABS
800.0000 mg | ORAL_TABLET | Freq: Once | ORAL | Status: DC
  Filled 2015-03-04: qty 1

## 2015-03-04 MED ORDER — METHOCARBAMOL 500 MG PO TABS
500.0000 mg | ORAL_TABLET | Freq: Once | ORAL | Status: AC
  Administered 2015-03-04: 500 mg via ORAL
  Filled 2015-03-04: qty 1

## 2015-03-04 MED ORDER — NAPROXEN 500 MG PO TABS: 500.0000 mg | ORAL_TABLET | Freq: Two times a day (BID) | ORAL | Status: DC

## 2015-03-04 NOTE — ED Notes (Signed)
Pt reports right sided neck pain x1 week. Pt reports intermittent relief with hydrocodone.nad noted. Pt denies any other symptoms.

## 2015-03-04 NOTE — Discharge Instructions (Signed)

## 2015-03-04 NOTE — ED Provider Notes (Signed)
CSN: [id number]     Arrival date & time 03/04/15  1016 History   First MD Initiated Contact with Patient 03/04/15 1121     Chief Complaint  Patient presents with  . Neck Pain     (Consider location/radiation/quality/duration/timing/severity/associated sxs/prior Treatment) HPI   Joshua Robertson is a 39 y.o. male who presents to the Emergency Department complaining of acute onset of right-sided neck pain for nearly one week. He states the pain began after sleeping on a sofa awoke the following morning with pain to his neck that is worse with movement. Pain resolves at rest. He states that he has been taking hydrocodone with intermittent relief. He denies numbness or weakness or pain to the upper extremities, discoloration, headaches, dizziness, or swelling.   Past Medical History  Diagnosis Date  . Helicobacter pylori gastritis 2009  . Spondyloarthropathy 2009 EROSIVE  . Sacroiliitis 2009  . Migraine headache     NONE SINCE AGE 4  . BMI between 19-24,adult JAN 2010 149 LBS  . Crohn's disease AUG 2009 NUR [place]    HB 9.4 FERRITIN 26  140 LBS; no serologies-pt can't afford test  . Hypertension    Past Surgical History  Procedure Laterality Date  . Colonoscopy    . Colonoscopy N/A 07/30/2013    stenotic IC VALVE. UNABLE TO INTUBATE ILEUM.   Family History  Problem Relation Age of Onset  . Colon cancer Neg Hx   . Colon polyps Neg Hx   . Inflammatory bowel disease Neg Hx   . Hypertension Mother   . Hypertension Father    Social History  Substance Use Topics  . Smoking status: Current Every Day Smoker -- 0.50 packs/day    Types: Cigarettes  . Smokeless tobacco: Never Used     Comment: Smokes 2-3 cigarettes daily  . Alcohol Use: No    Review of Systems  Constitutional: Negative for fever and chills.  Cardiovascular: Negative for chest pain.  Gastrointestinal: Negative for nausea and vomiting.  Genitourinary: Negative for dysuria and difficulty urinating.  Musculoskeletal:  Positive for neck pain (Right neck pain) and neck stiffness. Negative for back pain and joint swelling.  Skin: Negative for color change and wound.  Neurological: Negative for dizziness, weakness, numbness and headaches.  All other systems reviewed and are negative.     Allergies  Azathioprine; Penicillins; and Pentasa  Home Medications   Prior to Admission medications   Medication Sig Start Date End Date Taking? Authorizing Provider  benazepril (LOTENSIN) 40 MG tablet Take 1 tablet (40 mg total) by mouth daily. 06/02/11  Yes Dorie Rank, MD  cloNIDine (CATAPRES) 0.1 MG tablet Take 1 tablet (0.1 mg total) by mouth 2 (two) times daily. 08/09/12  Yes [name], MD  hydrochlorothiazide (MICROZIDE) 12.5 MG capsule Take 12.5 mg by mouth daily.   Yes Historical Provider, MD  inFLIXimab (REMICADE) 100 MG injection Inject 700 mg into the vein every 8 (eight) weeks.    Yes Historical Provider, MD  Nutritional Supplements (CARNATION INSTANT BREAKFAST PO) Take 1 Can by mouth 3 (three) times daily.   Yes Historical Provider, MD  tolnaftate (TINACTIN) 1 % cream Apply to feet and groin at hs 10/07/14  Yes Lily Kocher, PA-C  HYDROcodone-acetaminophen (NORCO/VICODIN) 5-325 MG per tablet Take one-two tabs po q 4-6 hrs prn pain 03/04/15   Yandel Zeiner, PA-C  methocarbamol (ROBAXIN) 500 MG tablet Take 1 tablet (500 mg total) by mouth 3 (three) times daily. 03/04/15   Lomax Poehler, PA-C  naproxen (  NAPROSYN) 500 MG tablet Take 1 tablet (500 mg total) by mouth 2 (two) times daily with a meal. 03/04/15   Carrieann Spielberg, PA-C   BP 112/65 mmHg  Pulse 74  Temp(Src) 97.8 F (36.6 C) (Oral)  Resp 18  Ht 5' 9.5" (1.765 m)  Wt 145 lb (65.772 kg)  BMI 21.11 kg/m2  SpO2 100% Physical Exam  Constitutional: He is oriented to person, place, and time. He appears well-developed and well-nourished. No distress.  HENT:  Head: Normocephalic and atraumatic.  Mouth/Throat: Oropharynx is clear and moist.  Eyes: EOM  are normal. Pupils are equal, round, and reactive to light.  Neck: Phonation normal. Muscular tenderness present. No spinous process tenderness present. No rigidity. Decreased range of motion present. No erythema present. No Brudzinski's sign and no Kernig's sign noted. No thyromegaly present.  ttp of the right cervical paraspinal muscles and along the right trapezius muscle.  Grip strength is strong and equal bilaterally.  Distal sensation intact,  CR < 2 sec.  5 out of 5 strength against resistance bilaterally of the upper extremities  Cardiovascular: Normal rate, regular rhythm, normal heart sounds and intact distal pulses.   No murmur heard. Pulmonary/Chest: Effort normal and breath sounds normal. No respiratory distress. He exhibits no tenderness.  Musculoskeletal: He exhibits tenderness. He exhibits no edema.       Cervical back: He exhibits tenderness. He exhibits normal range of motion, no bony tenderness, no swelling, no deformity, no spasm and normal pulse.  See neck exam  Lymphadenopathy:    He has no cervical adenopathy.  Neurological: He is alert and oriented to person, place, and time. He has normal strength. No sensory deficit. He exhibits normal muscle tone. Coordination normal.  Reflex Scores:      Tricep reflexes are 2+ on the right side and 2+ on the left side.      Bicep reflexes are 2+ on the right side and 2+ on the left side. Skin: Skin is warm and dry.  Nursing note and vitals reviewed.   ED Course  Procedures (including critical care time) Labs Review Labs Reviewed - No data to display  Imaging Review No results found. I have personally reviewed and evaluated these images and lab results as part of my medical decision-making.   EKG Interpretation None      MDM   Final diagnoses:  Torticollis    Right neck pain one week after sleeping on a sofa.  Likely acute torticollis.  No focal neuro deficits on exam.  Pain reproduced with palpation of the muscles of  the right neck. No reported history of trauma to indicate need for imaging at this time.  Vital signs are stable. Patient is well-appearing. He agrees to symptomatic treatment and close follow-up with his PCP. I have advised him to return here for any worsening symptoms.   Kem Parkinson, PA-C 03/05/15 1800  Milton Ferguson, MD 03/07/15 [phone]

## 2015-03-05 NOTE — Telephone Encounter (Signed)
PLEASE CALL PT. I AM SORRY BUT I CANNOT CONTINUE TO PRESCRIBE NARCOTICS FOR HIM. HE SHOULD PROCEED WITH HAVING HIS SURGERY TO RELIVE THE SMALL BOWEL STRICTURE.

## 2015-03-06 NOTE — Telephone Encounter (Signed)
I called and informed pt. He is scheduled to have another colonoscopy and MRI at [facility] around 03/17/2015.

## 2015-03-06 NOTE — Telephone Encounter (Signed)
REVIEWED-NO ADDITIONAL RECOMMENDATIONS. 

## 2015-03-15 HISTORY — PX: COLONOSCOPY: SHX174

## 2015-03-26 ENCOUNTER — Encounter (HOSPITAL_COMMUNITY): Admission: RE | Admit: 2015-03-26 | Payer: Self-pay | Source: Ambulatory Visit

## 2015-03-27 ENCOUNTER — Encounter (HOSPITAL_COMMUNITY)
Admission: RE | Admit: 2015-03-27 | Discharge: 2015-03-27 | Disposition: A | Discharge status: Home / Self Care | Payer: Self-pay | Source: Ambulatory Visit | Attending: Gastroenterology | Admitting: Gastroenterology

## 2015-03-27 DIAGNOSIS — K5090 Crohn's disease, unspecified, without complications: Secondary | ICD-10-CM | POA: Insufficient documentation

## 2015-03-27 LAB — COMPREHENSIVE METABOLIC PANEL
ALBUMIN: 4.2 g/dL (ref 3.5–5.0)
ALT: 12 U/L — ABNORMAL LOW (ref 17–63)
AST: 19 U/L (ref 15–41)
Alkaline Phosphatase: 56 U/L (ref 38–126)
Anion gap: 10 (ref 5–15)
BILIRUBIN TOTAL: 0.6 mg/dL (ref 0.3–1.2)
BUN: 19 mg/dL (ref 6–20)
CHLORIDE: 101 mmol/L (ref 101–111)
CO2: 29 mmol/L (ref 22–32)
Calcium: 9.4 mg/dL (ref 8.9–10.3)
Creatinine, Ser: 1.07 mg/dL (ref 0.61–1.24)
GFR calc Af Amer: >60 mL/min (ref >60)
GFR calc non Af Amer: >60 mL/min (ref >60)
GLUCOSE: 118 mg/dL — AB (ref 65–99)
POTASSIUM: 4 mmol/L (ref 3.5–5.1)
Sodium: 140 mmol/L (ref 135–145)
TOTAL PROTEIN: 7.3 g/dL (ref 6.5–8.1)

## 2015-03-27 LAB — CBC
HEMATOCRIT: 33.2 % — AB (ref 39.0–52.0)
Hemoglobin: 11.2 g/dL — ABNORMAL LOW (ref 13.0–17.0)
MCH: 25.6 pg — ABNORMAL LOW (ref 26.0–34.0)
MCHC: 33.7 g/dL (ref 30.0–36.0)
MCV: 75.8 fL — AB (ref 78.0–100.0)
Platelets: 346 10*3/uL (ref 150–400)
RBC: 4.38 MIL/uL (ref 4.22–5.81)
RDW: 14.9 % (ref 11.5–15.5)
WBC: 7.9 10*3/uL (ref 4.0–10.5)

## 2015-03-27 MED ORDER — SODIUM CHLORIDE 0.9 % IV SOLN: INTRAVENOUS | Status: DC

## 2015-03-27 MED ORDER — METHYLPREDNISOLONE SODIUM SUCC 125 MG IJ SOLR
40.0000 mg | Freq: Once | INTRAMUSCULAR | Status: AC
  Administered 2015-03-27: 40 mg via INTRAVENOUS

## 2015-03-27 MED ORDER — ACETAMINOPHEN 325 MG PO TABS
650.0000 mg | ORAL_TABLET | Freq: Once | ORAL | Status: AC
  Administered 2015-03-27: 650 mg via ORAL

## 2015-03-27 MED ORDER — LORATADINE 10 MG PO TABS
ORAL_TABLET | ORAL | Status: AC
  Filled 2015-03-27: qty 1

## 2015-03-27 MED ORDER — SODIUM CHLORIDE 0.9 % IV SOLN
700.0000 mg | Freq: Once | INTRAVENOUS | Status: AC
  Administered 2015-03-27: 700 mg via INTRAVENOUS
  Filled 2015-03-27: qty 70

## 2015-03-27 MED ORDER — ACETAMINOPHEN 325 MG PO TABS
ORAL_TABLET | ORAL | Status: AC
  Filled 2015-03-27: qty 2

## 2015-03-27 MED ORDER — LORATADINE 10 MG PO TABS
10.0000 mg | ORAL_TABLET | Freq: Every day | ORAL | Status: DC
  Administered 2015-03-27: 10 mg via ORAL

## 2015-03-27 MED ORDER — METHYLPREDNISOLONE SODIUM SUCC 125 MG IJ SOLR
INTRAMUSCULAR | Status: AC
  Filled 2015-03-27: qty 2

## 2015-04-02 ENCOUNTER — Telehealth: Payer: Self-pay

## 2015-04-02 ENCOUNTER — Ambulatory Visit (INDEPENDENT_AMBULATORY_CARE_PROVIDER_SITE_OTHER): Payer: Self-pay | Admitting: Gastroenterology

## 2015-04-02 ENCOUNTER — Encounter: Payer: Self-pay | Admitting: Gastroenterology

## 2015-04-02 ENCOUNTER — Other Ambulatory Visit: Payer: Self-pay

## 2015-04-02 VITALS — BP 126/87 | HR 61 | Temp 98.3°F | Ht 69.0 in | Wt 144.0 lb

## 2015-04-02 DIAGNOSIS — M79675 Pain in left toe(s): Secondary | ICD-10-CM

## 2015-04-02 DIAGNOSIS — K50812 Crohn's disease of both small and large intestine with intestinal obstruction: Secondary | ICD-10-CM

## 2015-04-02 MED ORDER — HYDROCODONE-ACETAMINOPHEN 5-325 MG PO TABS: ORAL_TABLET | ORAL | Status: DC

## 2015-04-02 NOTE — Patient Instructions (Signed)
CONTINUE REMICADE.   FOLLOW UP WITH [facility]. I WILL GET THE LAST VISIT NOTE FROM [facility].  SEE PODIATRY FOR YOUR TOE PROBLEM.

## 2015-04-02 NOTE — Telephone Encounter (Signed)
I called and informed pt, [phone]).

## 2015-04-02 NOTE — Assessment & Plan Note (Addendum)
ACUTE ONSET, POSSIBLE DUE TO SUBUNGUAL HEMATOMA. DENIES TRAUMA. PMHx: TINEA CORPORIS ON LEFT FOOT.  PODIATRY REFERRAL. ICE TO TOE

## 2015-04-02 NOTE — Telephone Encounter (Signed)
PLEASE CALL PT. HE CAN PICK IT UP AFTER 2 PM TOMORROW.

## 2015-04-02 NOTE — Assessment & Plan Note (Signed)
DISEASE NOT CONTROLLED WITH REMICADE. FAILED HUMIRA. SYMPTOMS FAIRLY WELL CONTROLLED.  REFILL HYDROCODONE CONTINUE REMICADE CHANGE BIOLOGIC PER [facility] [place]. AWAIT RECS FROM [facility] SURGERY. EXPLAINED TO PT LOW HB ASSOCIATED WITH CHRONIC ILEITIS. FOLLOW UP IN 4 MOS.

## 2015-04-02 NOTE — Progress Notes (Signed)
Subjective:    Patient ID: Joshua Robertson, male    DOB: 12-31-1975, 39 y.o.   MRN: [id number] MUSE,ROCHELLE D., PA-C  HPI STILL GETTING REMICADE. LAST Rx THURS. SEEN AT [facility] AND HAD MRE AND TCS. HAS DISTAL ILEITIS UNRESOLVED WITH REMICADE. [facility] GI PLANNING TO START NEW MED. BMs: #4-7, MOSTLY #5-6 RANGE. VOMITED: 2 X IN LAST 3 WEEKS(NO BLOOD). FEELS CONSTIPATED SOMETIMES. NOW HAS LEFT TOE PAIN ON REMICADE. RASH OF LEFT FOOT WENT AWAY WITH TINACTIN.BEEN GONE FOR 2-3 MOS.  LEFT TOE PAIN SEVERE STARTED 4 DAYS AGO.Marland Kitchen ABDOMINAL PAIN: CRAMPING, SQUEEZING, MID TO LOWER ABDOMINAL.  PT DENIES FEVER, CHILLS, HEMATOCHEZIA, nausea,  melena, CHEST PAIN, SHORTNESS OF BREATH,  CHANGE IN BOWEL IN HABITS, constipation, problems swallowing, problems with sedation, OR heartburn or indigestion.   Past Medical History  Diagnosis Date  . Helicobacter pylori gastritis 2009  . Spondyloarthropathy 2009 EROSIVE  . Sacroiliitis 2009  . Migraine headache     NONE SINCE AGE 36  . BMI between 19-24,adult JAN 2010 149 LBS  . Crohn's disease AUG 2009 NUR [place]    HB 9.4 FERRITIN 26  140 LBS; no serologies-pt can't afford test  . Hypertension    Past Surgical History  Procedure Laterality Date  . Colonoscopy    . Colonoscopy N/A 07/30/2013    stenotic IC VALVE. UNABLE TO INTUBATE ILEUM.   Allergies  Allergen Reactions  . Azathioprine Nausea And Vomiting  . Penicillins Swelling and Rash  . Pentasa [Mesalamine Er] Rash    Current Outpatient Prescriptions  Medication Sig Dispense Refill  . benazepril (LOTENSIN) 40 MG tablet Take 1 tablet (40 mg total) by mouth daily.    . cloNIDine (CATAPRES) 0.1 MG tablet Take 1 tablet (0.1 mg total) by mouth 2 (two) times daily.    . hydrochlorothiazide (MICROZIDE) 12.5 MG capsule Take 12.5 mg by mouth daily.    Marland Kitchen HYDROcodone-acetaminophen (NORCO/VICODIN) 5-325 MG per tablet Take one-two tabs po q 4-6 hrs prn pain    . inFLIXimab (REMICADE) 100 MG injection Inject 700 mg into the  vein every 8 (eight) weeks.     . methocarbamol (ROBAXIN) 500 MG tablet Take 1 tablet (500 mg total) by mouth 3 (three) times daily.    . Nutritional Supplements (CARNATION INSTANT BREAKFAST PO) Take 1 Can by mouth 3 (three) times daily.    .      . tolnaftate (TINACTIN) 1 % cream Apply to feet and groin at hs (Patient not taking: Reported on 04/02/2015)     Review of Systems PER HPI OTHERWISE ALL SYSTEMS ARE NEGATIVE.    Objective:   Physical Exam  Constitutional: He is oriented to person, place, and time. He appears well-developed and well-nourished. No distress.  HENT:  Head: Normocephalic and atraumatic.  Mouth/Throat: Oropharynx is clear and moist. No oropharyngeal exudate.  Eyes: Pupils are equal, round, and reactive to light. No scleral icterus.  Neck: Normal range of motion. Neck supple.  Cardiovascular: Normal rate, regular rhythm and normal heart sounds.   Pulmonary/Chest: Effort normal and breath sounds normal. No respiratory distress.  Abdominal: Soft. Bowel sounds are normal. He exhibits no distension. There is tenderness. There is no rebound and no guarding.  MILD BLQs TTP  Musculoskeletal: He exhibits tenderness (MODERTAE WITH DISCOLORATION OF LEFT GREAT TOE NAIL, NO ERYTHEM OR DISCHARGE.). He exhibits no edema.  Lymphadenopathy:    He has no cervical adenopathy.  Neurological: He is alert and oriented to person, place, and time.  NO  NEW FOCAL DEFICITS  Psychiatric: He has a normal mood and affect.  Vitals reviewed.         Assessment & Plan:

## 2015-04-02 NOTE — Telephone Encounter (Signed)
Pt's girl friend called and said he needs a letter stating that he cannot work due to [employer]. She said he lives with her and she is out on a medical leave and trying to get food stamps and they said they need some info on him.

## 2015-04-02 NOTE — Progress Notes (Signed)
cc'ed to pcp °

## 2015-04-02 NOTE — Progress Notes (Signed)
ON RECALL  °

## 2015-04-04 ENCOUNTER — Encounter: Payer: Self-pay | Admitting: General Practice

## 2015-05-22 ENCOUNTER — Encounter (HOSPITAL_COMMUNITY)
Admission: RE | Admit: 2015-05-22 | Discharge: 2015-05-22 | Disposition: A | Discharge status: Home / Self Care | Payer: Self-pay | Source: Ambulatory Visit | Attending: Gastroenterology | Admitting: Gastroenterology

## 2015-05-22 DIAGNOSIS — K5090 Crohn's disease, unspecified, without complications: Secondary | ICD-10-CM | POA: Insufficient documentation

## 2015-05-22 MED ORDER — SODIUM CHLORIDE 0.9 % IV SOLN
INTRAVENOUS | Status: DC
  Administered 2015-05-22: 250 mL via INTRAVENOUS

## 2015-05-22 MED ORDER — SODIUM CHLORIDE 0.9 % IV SOLN
10.0000 mg/kg | Freq: Once | INTRAVENOUS | Status: AC
  Administered 2015-05-22: 700 mg via INTRAVENOUS
  Filled 2015-05-22: qty 70

## 2015-05-22 MED ORDER — LORATADINE 10 MG PO TABS
10.0000 mg | ORAL_TABLET | Freq: Every day | ORAL | Status: DC
  Administered 2015-05-22: 10 mg via ORAL
  Filled 2015-05-22: qty 1

## 2015-05-22 MED ORDER — ACETAMINOPHEN 325 MG PO TABS
650.0000 mg | ORAL_TABLET | Freq: Four times a day (QID) | ORAL | Status: DC | PRN
  Administered 2015-05-22: 650 mg via ORAL
  Filled 2015-05-22: qty 2

## 2015-05-22 MED ORDER — METHYLPREDNISOLONE SODIUM SUCC 125 MG IJ SOLR
40.0000 mg | Freq: Once | INTRAMUSCULAR | Status: AC
  Administered 2015-05-22: 40 mg via INTRAVENOUS
  Filled 2015-05-22: qty 2

## 2015-06-12 ENCOUNTER — Telehealth: Payer: Self-pay | Admitting: General Practice

## 2015-06-12 NOTE — Telephone Encounter (Signed)
I spoke with Joshua Robertson at [name] and she stated that they have been shipping the product to [facility] in the patient's name.  She went on to say that D. Landry Mellow signed for the 03/2015 shipment and P. Kieth Brightly signed for the 01/2015 shipment.

## 2015-06-12 NOTE — Telephone Encounter (Signed)
I also spoke with [phone]) in the pharmacy department at [facility] and he stated that he will credit the patient's account for the charges except for December because they have not received shipment for that visit, however per Wynetta Emery and Wynetta Emery it should be next week.    The pharmacy department is working with the Epic team on a way to flag these patients that will be receivng shipments from the drug companies to [facility] for their meidcations

## 2015-06-12 NOTE — Telephone Encounter (Signed)
I received a call from Genoveva Ill from [place], stating that the patient has accrued over $175,000 worth of debt for Remicade, however he is on the patient assistance program with Wynetta Emery and Wynetta Emery, they ship the medication to [facility] for them to administer.    Per Caryl Pina she is going to check with someone else and get back to me

## 2015-06-12 NOTE — Telephone Encounter (Signed)
I called Wynetta Emery and [place] patient assistance customer service at [phone] to verify shipments to [facility].

## 2015-06-18 NOTE — Telephone Encounter (Signed)
I spoke with Dr. Oneida Alar and she stated she will speak to the patient at his February office visit.  Failure to complete his financial assistance can cause a delay in him receiving his Remicade

## 2015-06-18 NOTE — Telephone Encounter (Signed)
I spoke back with Caryl Pina at [facility] and she stated she will call Mr, Scheiber in for a meeting in regards to his financial assistance and bad debt status

## 2015-06-18 NOTE — Telephone Encounter (Signed)
Patient's financial assistance is good until June of 2017

## 2015-06-18 NOTE — Telephone Encounter (Signed)
Per Wynetta Emery and [place] patient did not have patient assistance during 2012-2015 and he continued to receive Remicade Infusions at [facility]

## 2015-06-18 NOTE — Telephone Encounter (Signed)
Routing to Dr. Oneida Alar

## 2015-06-19 NOTE — Telephone Encounter (Signed)
REVIEWED-NO ADDITIONAL RECOMMENDATIONS. 

## 2015-06-25 NOTE — Telephone Encounter (Signed)
I called Joshua Robertson and Joshua Robertson to follow-up on the retro delivery of Remicade for dose that was administered in 01/2015, however I was told that it was still in review and I needed to call back on Friday.

## 2015-06-30 ENCOUNTER — Encounter: Payer: Self-pay | Admitting: Gastroenterology

## 2015-07-03 NOTE — Telephone Encounter (Signed)
I spoke with a representative at [facility] and I was told that the retro request for the 01/2015 shipment was denied.

## 2015-07-15 ENCOUNTER — Telehealth: Payer: Self-pay

## 2015-07-15 NOTE — Telephone Encounter (Signed)
T/C from [place] at Endo, said she was asked to call and let Dr. Oneida Alar know that he would need to be seen here by Dr. Oneida Alar to reschedule his Remicade. Said he owes in excess of $100,000 and is not trying to work out anything for his part.

## 2015-07-16 NOTE — Telephone Encounter (Signed)
I spoke with Joshua Robertson and confirmed that it's ok for Joshua Robertson to have his infusion tomorrow.  Also, I asked her if she would make sure the nurses are communicating with [place] prior to making his appt for his f/u infusion.

## 2015-07-16 NOTE — Telephone Encounter (Signed)
Noted  

## 2015-07-16 NOTE — Telephone Encounter (Signed)
I confirmed with Tretha Sciara at [facility] that the medication will arrive at [facility] today, so no need to cancel his test.

## 2015-07-17 ENCOUNTER — Encounter (HOSPITAL_COMMUNITY): Admission: RE | Admit: 2015-07-17 | Payer: Self-pay | Source: Ambulatory Visit

## 2015-07-23 ENCOUNTER — Encounter (HOSPITAL_COMMUNITY)
Admission: RE | Admit: 2015-07-23 | Discharge: 2015-07-23 | Disposition: A | Discharge status: Home / Self Care | Payer: Self-pay | Source: Ambulatory Visit | Attending: Gastroenterology | Admitting: Gastroenterology

## 2015-07-23 DIAGNOSIS — K5090 Crohn's disease, unspecified, without complications: Secondary | ICD-10-CM | POA: Insufficient documentation

## 2015-07-23 LAB — COMPREHENSIVE METABOLIC PANEL
ALBUMIN: 4.3 g/dL (ref 3.5–5.0)
ALT: 11 U/L — AB (ref 17–63)
AST: 20 U/L (ref 15–41)
Alkaline Phosphatase: 64 U/L (ref 38–126)
Anion gap: 9 (ref 5–15)
BUN: 14 mg/dL (ref 6–20)
CHLORIDE: 102 mmol/L (ref 101–111)
CO2: 31 mmol/L (ref 22–32)
CREATININE: 1.08 mg/dL (ref 0.61–1.24)
Calcium: 9.2 mg/dL (ref 8.9–10.3)
GFR calc Af Amer: >60 mL/min (ref >60)
GFR calc non Af Amer: >60 mL/min (ref >60)
GLUCOSE: 104 mg/dL — AB (ref 65–99)
POTASSIUM: 3.6 mmol/L (ref 3.5–5.1)
Sodium: 142 mmol/L (ref 135–145)
Total Bilirubin: 0.4 mg/dL (ref 0.3–1.2)
Total Protein: 7.5 g/dL (ref 6.5–8.1)

## 2015-07-23 LAB — CBC
HCT: 36.3 % — ABNORMAL LOW (ref 39.0–52.0)
Hemoglobin: 12.2 g/dL — ABNORMAL LOW (ref 13.0–17.0)
MCH: 25.4 pg — ABNORMAL LOW (ref 26.0–34.0)
MCHC: 33.6 g/dL (ref 30.0–36.0)
MCV: 75.5 fL — ABNORMAL LOW (ref 78.0–100.0)
PLATELETS: 315 10*3/uL (ref 150–400)
RBC: 4.81 MIL/uL (ref 4.22–5.81)
RDW: 15.5 % (ref 11.5–15.5)
WBC: 8.8 10*3/uL (ref 4.0–10.5)

## 2015-07-23 MED ORDER — LORATADINE 10 MG PO TABS
10.0000 mg | ORAL_TABLET | Freq: Once | ORAL | Status: AC
  Administered 2015-07-23: 10 mg via ORAL
  Filled 2015-07-23: qty 1

## 2015-07-23 MED ORDER — SODIUM CHLORIDE 0.9 % IV SOLN
Freq: Once | INTRAVENOUS | Status: AC
  Administered 2015-07-23: 200 mL via INTRAVENOUS

## 2015-07-23 MED ORDER — ACETAMINOPHEN 325 MG PO TABS
650.0000 mg | ORAL_TABLET | Freq: Once | ORAL | Status: AC
  Administered 2015-07-23: 650 mg via ORAL
  Filled 2015-07-23: qty 2

## 2015-07-23 MED ORDER — METHYLPREDNISOLONE SODIUM SUCC 125 MG IJ SOLR
40.0000 mg | Freq: Once | INTRAMUSCULAR | Status: AC
  Administered 2015-07-23: 40 mg via INTRAVENOUS
  Filled 2015-07-23: qty 2

## 2015-07-23 MED ORDER — SODIUM CHLORIDE 0.9 % IV SOLN
700.0000 mg | Freq: Once | INTRAVENOUS | Status: AC
  Administered 2015-07-23: 700 mg via INTRAVENOUS
  Filled 2015-07-23: qty 70

## 2015-07-31 ENCOUNTER — Telehealth: Payer: Self-pay | Admitting: Gastroenterology

## 2015-07-31 NOTE — Telephone Encounter (Signed)
PLEASE CALL PT. HIS LABS ARE STABLE.

## 2015-08-01 NOTE — Telephone Encounter (Signed)
PT is aware of results.  

## 2015-09-04 ENCOUNTER — Encounter: Payer: Self-pay | Admitting: Gastroenterology

## 2015-09-04 ENCOUNTER — Ambulatory Visit (INDEPENDENT_AMBULATORY_CARE_PROVIDER_SITE_OTHER): Payer: Self-pay | Admitting: Gastroenterology

## 2015-09-04 VITALS — BP 104/68 | HR 71 | Temp 97.7°F | Ht 69.0 in | Wt 139.0 lb

## 2015-09-04 DIAGNOSIS — K50812 Crohn's disease of both small and large intestine with intestinal obstruction: Secondary | ICD-10-CM

## 2015-09-04 MED ORDER — HYDROCODONE-ACETAMINOPHEN 5-325 MG PO TABS: ORAL_TABLET | ORAL | Status: DC

## 2015-09-04 NOTE — Progress Notes (Signed)
CC'ED TO PCP 

## 2015-09-04 NOTE — Progress Notes (Signed)
   Subjective:    Patient ID: Joshua Robertson, male    DOB: [birth year], 40 y.o.   MRN: [id number]  MUSE,ROCHELLE D., PA-C  HPI Seen at [facility]. No plan for surgery. REcommended change biologic agents. ATTEMPTED TO OBTAIN ENTYVIO, BUT HASN'T HEARD ANYTHING SINCE OCT 2016.  ABDOMINAL PAIN(SHARP OR DULL, BALLS UP IN THE MIDDLE): EVERY DAY, 2-3 DAYS A WEEL IT GETS MORE SEVERE. APPETITE: OK, LOST 9 LBS SINCE FEB 2016. NAUSEA: 3X/WEEK. VOMITING: ONCE A WEEK.  BM: #4-7 AND IT MOVES 3 TIMES A WEEK. PT DENIES FEVER, CHILLS, HEMATOCHEZIA, HEMATEMESIS, melena, CHEST PAIN, SHORTNESS OF BREATH,  CHANGE IN BOWEL IN HABITS, constipation, problems swallowing, problems with sedation, OR heartburn or indigestion.   Past Medical History  Diagnosis Date  . Helicobacter pylori gastritis 2009  . Spondyloarthropathy 2009 EROSIVE  . Sacroiliitis ([place]) 2009  . Migraine headache     NONE SINCE AGE 36  . BMI between 19-24,adult JAN 2010 149 LBS  . Crohn's disease ([place]) AUG 2009 NUR [facility]    HB 9.4 FERRITIN 26  140 LBS; no serologies-pt can't afford test  . Hypertension     Past Surgical History  Procedure Laterality Date  . Colonoscopy    . Colonoscopy N/A 07/30/2013    stenotic IC VALVE. UNABLE TO INTUBATE ILEUM.    Allergies  Allergen Reactions  . Azathioprine Nausea And Vomiting  . Penicillins Swelling and Rash  . Pentasa [Mesalamine Er] Rash   Current Outpatient Prescriptions  Medication Sig Dispense Refill  . benazepril (LOTENSIN) 40 MG tablet Take 1 tablet (40 mg total) by mouth daily.    . cloNIDine (CATAPRES) 0.1 MG tablet Take 1 tablet (0.1 mg total) by mouth 2 (two) times daily.    . hydrochlorothiazide  12.5 MG capsule Take 12.5 mg by mouth daily.    Marland Kitchen inFLIXimab 100 MG injection Inject 700 mg into the vein every 8 (eight) weeks.     Marland Kitchen ENUSRE  Take 1 Can by mouth 3 (three) times daily.    .      .      .      Marland Kitchen tolnaftate (TINACTIN) 1 % cream Apply to feet and groin at hs (Patient not taking:  Reported on 04/02/2015)     Review of Systems PER HPI OTHERWISE ALL SYSTEMS ARE NEGATIVE.    Objective:   Physical Exam  Constitutional: He is oriented to person, place, and time. He appears well-developed and well-nourished. No distress.  HENT:  Head: Normocephalic and atraumatic.  Mouth/Throat: Oropharynx is clear and moist. No oropharyngeal exudate.  Eyes: Pupils are equal, round, and reactive to light. No scleral icterus.  Neck: Normal range of motion. Neck supple.  Cardiovascular: Normal rate, regular rhythm and normal heart sounds.   Pulmonary/Chest: Effort normal and breath sounds normal. No respiratory distress.  Abdominal: Soft. Bowel sounds are normal. He exhibits no distension. There is tenderness. There is no rebound and no guarding.  MILD RUQ TENDERNESS  Musculoskeletal: He exhibits no edema.  Lymphadenopathy:    He has no cervical adenopathy.  Neurological: He is alert and oriented to person, place, and time.  NO  NEW FOCAL DEFICITS  Psychiatric: He has a normal mood and affect.  Vitals reviewed.     Assessment & Plan:

## 2015-09-04 NOTE — Patient Instructions (Signed)
COMPLETE PAPERWORK FOR ENTYVIO.  USE NORCO AS NEEDED.  CONTINUE REMICADE UNTIL ENTYVIO IS OBTAINED.  FOLLOW UP IN 6 MOS.

## 2015-09-04 NOTE — Assessment & Plan Note (Signed)
MILDLY ACTIVE DISEASE WITH STRICTURE AND FAILING TO RESPOND TO/RESOLVEWITH REMICADE.  COMPLETE PAPERWORK FOR ENTYVIO. USE NORCO AS NEEDED. CONTINUE REMICADE UNTIL ENTYVIO IS OBTAINED. REVIEWED LABS FROM FEB 2017. FOLLOW UP IN 6 MOS.

## 2015-09-04 NOTE — Progress Notes (Signed)
ON RECALL  °

## 2015-09-12 ENCOUNTER — Telehealth: Payer: Self-pay | Admitting: General Practice

## 2015-09-12 NOTE — Telephone Encounter (Signed)
I spoke with Tameka at [facility] [place] to confirm that the patient's Remicade was shipped to [facility] today at 9:54 am and B. Abner Greenspan signed for the medication.  [employer] EX tracking number: [id number]

## 2015-09-12 NOTE — Telephone Encounter (Signed)
I spoke with Joshua Robertson and he stated he would have the assistance paperwork for Entyvio to me by Monday

## 2015-09-13 ENCOUNTER — Telehealth: Payer: Self-pay | Admitting: Gastroenterology

## 2015-09-13 NOTE — Telephone Encounter (Signed)
Paperwork complete. To be mailed APR 3.

## 2015-09-14 NOTE — Telephone Encounter (Signed)
Paperwork mailed

## 2015-09-17 ENCOUNTER — Encounter (HOSPITAL_COMMUNITY): Payer: Self-pay

## 2015-09-17 ENCOUNTER — Encounter (HOSPITAL_COMMUNITY)
Admission: RE | Admit: 2015-09-17 | Discharge: 2015-09-17 | Disposition: A | Discharge status: Home / Self Care | Payer: Self-pay | Source: Ambulatory Visit | Attending: Gastroenterology | Admitting: Gastroenterology

## 2015-09-17 ENCOUNTER — Ambulatory Visit (HOSPITAL_COMMUNITY): Admission: RE | Admit: 2015-09-17 | Payer: Self-pay | Source: Ambulatory Visit

## 2015-09-17 DIAGNOSIS — K5090 Crohn's disease, unspecified, without complications: Secondary | ICD-10-CM | POA: Insufficient documentation

## 2015-09-17 MED ORDER — ACETAMINOPHEN 325 MG PO TABS
650.0000 mg | ORAL_TABLET | Freq: Once | ORAL | Status: AC
  Administered 2015-09-17: 650 mg via ORAL
  Filled 2015-09-17: qty 2

## 2015-09-17 MED ORDER — METHYLPREDNISOLONE SODIUM SUCC 125 MG IJ SOLR
40.0000 mg | Freq: Once | INTRAMUSCULAR | Status: AC
  Administered 2015-09-17: 40 mg via INTRAVENOUS
  Filled 2015-09-17: qty 2

## 2015-09-17 MED ORDER — INFLIXIMAB 100 MG IV SOLR
10.0000 mg/kg | Freq: Once | INTRAVENOUS | Status: AC
  Administered 2015-09-17: 600 mg via INTRAVENOUS
  Filled 2015-09-17: qty 60

## 2015-09-17 MED ORDER — SODIUM CHLORIDE 0.9 % IV SOLN
Freq: Once | INTRAVENOUS | Status: AC
  Administered 2015-09-17: 250 mL via INTRAVENOUS

## 2015-09-17 MED ORDER — LORATADINE 10 MG PO TABS
10.0000 mg | ORAL_TABLET | Freq: Every day | ORAL | Status: DC
  Administered 2015-09-17: 10 mg via ORAL
  Filled 2015-09-17: qty 1

## 2015-09-25 ENCOUNTER — Telehealth: Payer: Self-pay | Admitting: Gastroenterology

## 2015-09-25 NOTE — Telephone Encounter (Signed)
PAPERWORK COMPLETE FOR REMICADE & ENTYVIO.

## 2015-10-29 NOTE — Telephone Encounter (Signed)
Pt brought by more paperwork that needs to be filled out and signed for entyvio. I have filled out what I can and placed it in Dr.Fields chair for her signature and for rx part to be filled out.

## 2015-10-30 ENCOUNTER — Encounter: Payer: Self-pay | Admitting: Podiatry

## 2015-10-30 ENCOUNTER — Ambulatory Visit: Payer: No Typology Code available for payment source | Admitting: Podiatry

## 2015-10-30 VITALS — BP 91/63 | HR 73 | Resp 14

## 2015-10-30 DIAGNOSIS — L600 Ingrowing nail: Secondary | ICD-10-CM

## 2015-10-30 DIAGNOSIS — B351 Tinea unguium: Secondary | ICD-10-CM

## 2015-10-30 NOTE — Progress Notes (Signed)
   Subjective:   Patient ID: Joshua Robertson, male    DOB: 12/13/1975, 40 y.o.   MRN: [id number]  HPI this patient presents to the office with problems with both great toenails on both feet. He states that his left great toenail came off 2 months ago, but has regrown and he is presently not having any problems with his left great toenail. He says his right great toenail also was injured and the inside border of the toenail is extremely painful and sore walking and wearing his shoes. He also has a loose nail at the distal nail lateral border. He presents the office today for definitive evaluation and treatment of this condition    Review of Systems  Skin:       Change in nails  All other systems reviewed and are negative.     Objective:  Physical Exam GENERAL APPEARANCE: Alert, conversant. Appropriately groomed. No acute distress.  VASCULAR: Pedal pulses are  palpable at  [facility] and PT bilateral.  Capillary refill time is immediate to all digits,  Normal temperature gradient.  Digital hair growth is present bilateral  NEUROLOGIC: sensation is normal to 5.07 monofilament at 5/5 sites bilateral.  Light touch is intact bilateral, Muscle strength normal.  MUSCULOSKELETAL: acceptable muscle strength, tone and stability bilateral.  Intrinsic muscluature intact bilateral.  HAV deformity 1st MPJ  B/L  DERMATOLOGIC: skin color, texture, and turgor are within normal limits.  No preulcerative lesions or ulcers  are seen, no interdigital maceration noted.  No open lesions present.   No drainage noted.  NAILS  Nail left hallux is regrowing attached to nail bed.  The right thick incurvated nail is painful medial border right hallux.  No redness or swelling or pain.       Assessment & Plan:  Onychomycosis Right hallux  Ingrown Toenail medial border right hallux.  IE  Nail surgery.  Nail surgery.  Treatment options and alternatives discussed.  Recommended permanent phenol matrixectomy and patient agreed.   Left hallux  was prepped with alcohol and a toe block of 3cc of 2% lidocaine plain was administered in a digital toe block. .  The toe was then prepped with betadine solution .  The offending nail border was then excised and matrix tissue exposed.  Phenol was then applied to the matrix tissue followed by an alcohol wash.  Antibiotic ointment and a dry sterile dressing was applied.  The patient was dispensed instructions for aftercare. RTC 1 week. Remnant nail right hallux will be worked on at that visit.     Gardiner Barefoot DPM

## 2015-10-31 NOTE — Progress Notes (Deleted)
error 

## 2015-11-06 ENCOUNTER — Ambulatory Visit (INDEPENDENT_AMBULATORY_CARE_PROVIDER_SITE_OTHER): Payer: No Typology Code available for payment source | Admitting: Podiatry

## 2015-11-06 ENCOUNTER — Encounter: Payer: Self-pay | Admitting: Podiatry

## 2015-11-06 VITALS — BP 115/67 | HR 70 | Resp 12

## 2015-11-06 DIAGNOSIS — Z09 Encounter for follow-up examination after completed treatment for conditions other than malignant neoplasm: Secondary | ICD-10-CM

## 2015-11-06 DIAGNOSIS — L600 Ingrowing nail: Secondary | ICD-10-CM

## 2015-11-06 NOTE — Progress Notes (Signed)
Patient ID: Joshua Robertson, male   DOB: [birth year], 40 y.o.   MRN: [id number] This patient returns to the office following nail surgery one week ago.  The patient says toe has been soaked and bandaged as directed.  There has been improvement of the toe since the surgery has been performed. The patient presents for continued evaluation and treatment.  GENERAL APPEARANCE: Alert, conversant. Appropriately groomed. No acute distress.  VASCULAR: Pedal pulses palpable at  [facility] and PT bilateral.  Capillary refill time is immediate to all digits,  Normal temperature gradient.    NEUROLOGIC: sensation is normal to 5.07 monofilament at 5/5 sites bilateral.  Light touch is intact bilateral, Muscle strength normal.  MUSCULOSKELETAL: acceptable muscle strength, tone and stability bilateral.  Intrinsic muscluature intact bilateral.  Rectus appearance of foot and digits noted bilateral.   DERMATOLOGIC: skin color, texture, and turgor are within normal limits.  No preulcerative lesions or ulcers  are seen, no interdigital maceration noted.   NAILS  There is necrotic tissue along the nail groove  In the absence of redness swelling and pain.  DX  S/p nail surgery  ROV  Home instructions were discussed.  Patient to call the office if there are any questions or concerns.   Gardiner Barefoot DPM

## 2015-11-07 NOTE — Telephone Encounter (Signed)
Paperwork complete May 25.

## 2015-11-11 ENCOUNTER — Telehealth: Payer: Self-pay | Admitting: General Practice

## 2015-11-11 NOTE — Telephone Encounter (Signed)
I received a call from [place] [phone]) at [facility] in regards to Remicade medication for Joshua Robertson.    I called Wynetta Emery and [facility] and spoke with Kandra Nicolas in the patient assistance department to confirm shipment for Joshua Robertson Nov 11, 2015 date of service.    The order was placed on September 30, 2015.  The medication will be shipped out by the end of the week via [employer].  I made Endo aware of this.

## 2015-11-12 ENCOUNTER — Encounter (HOSPITAL_COMMUNITY)
Admission: RE | Admit: 2015-11-12 | Discharge: 2015-11-12 | Disposition: A | Discharge status: Home / Self Care | Payer: Self-pay | Source: Ambulatory Visit | Attending: Gastroenterology | Admitting: Gastroenterology

## 2015-11-12 DIAGNOSIS — K5090 Crohn's disease, unspecified, without complications: Secondary | ICD-10-CM | POA: Insufficient documentation

## 2015-11-12 LAB — COMPREHENSIVE METABOLIC PANEL
ALT: 12 U/L — AB (ref 17–63)
ANION GAP: 8 (ref 5–15)
AST: 18 U/L (ref 15–41)
Albumin: 4 g/dL (ref 3.5–5.0)
Alkaline Phosphatase: 53 U/L (ref 38–126)
BUN: 16 mg/dL (ref 6–20)
CHLORIDE: 98 mmol/L — AB (ref 101–111)
CO2: 31 mmol/L (ref 22–32)
CREATININE: 1.03 mg/dL (ref 0.61–1.24)
Calcium: 9.4 mg/dL (ref 8.9–10.3)
Glucose, Bld: 129 mg/dL — ABNORMAL HIGH (ref 65–99)
POTASSIUM: 3.7 mmol/L (ref 3.5–5.1)
SODIUM: 137 mmol/L (ref 135–145)
Total Bilirubin: 0.5 mg/dL (ref 0.3–1.2)
Total Protein: 7.1 g/dL (ref 6.5–8.1)

## 2015-11-12 LAB — CBC
HEMATOCRIT: 34.7 % — AB (ref 39.0–52.0)
HEMOGLOBIN: 11.4 g/dL — AB (ref 13.0–17.0)
MCH: 25.1 pg — AB (ref 26.0–34.0)
MCHC: 32.9 g/dL (ref 30.0–36.0)
MCV: 76.4 fL — AB (ref 78.0–100.0)
PLATELETS: 315 10*3/uL (ref 150–400)
RBC: 4.54 MIL/uL (ref 4.22–5.81)
RDW: 15.9 % — ABNORMAL HIGH (ref 11.5–15.5)
WBC: 7.7 10*3/uL (ref 4.0–10.5)

## 2015-11-12 MED ORDER — SODIUM CHLORIDE 0.9 % IV SOLN
10.0000 mg/kg | INTRAVENOUS | Status: DC
  Administered 2015-11-12: 600 mg via INTRAVENOUS
  Filled 2015-11-12: qty 60

## 2015-11-12 MED ORDER — METHYLPREDNISOLONE SODIUM SUCC 40 MG IJ SOLR
40.0000 mg | Freq: Once | INTRAMUSCULAR | Status: AC
  Administered 2015-11-12: 40 mg via INTRAVENOUS

## 2015-11-12 MED ORDER — ACETAMINOPHEN 325 MG PO TABS
ORAL_TABLET | ORAL | Status: AC
  Filled 2015-11-12: qty 2

## 2015-11-12 MED ORDER — LORATADINE 10 MG PO TABS
10.0000 mg | ORAL_TABLET | Freq: Every day | ORAL | Status: DC
  Administered 2015-11-12: 10 mg via ORAL

## 2015-11-12 MED ORDER — ACETAMINOPHEN 325 MG PO TABS
650.0000 mg | ORAL_TABLET | Freq: Once | ORAL | Status: AC
  Administered 2015-11-12: 650 mg via ORAL

## 2015-11-12 MED ORDER — LORATADINE 10 MG PO TABS
ORAL_TABLET | ORAL | Status: AC
  Filled 2015-11-12: qty 1

## 2015-11-12 MED ORDER — SODIUM CHLORIDE 0.9 % IV SOLN
INTRAVENOUS | Status: DC
  Administered 2015-11-12: 09:00:00 via INTRAVENOUS

## 2015-11-12 MED ORDER — METHYLPREDNISOLONE SODIUM SUCC 40 MG IJ SOLR
INTRAMUSCULAR | Status: AC
  Filled 2015-11-12: qty 1

## 2015-11-12 NOTE — Progress Notes (Signed)
Results for Joshua Robertson, Joshua Robertson (MRN [id number]) as of 11/12/2015 10:22  Ref. Range 11/12/2015 09:33  Sodium Latest Ref Range: 135-145 mmol/L 137  Potassium Latest Ref Range: 3.5-5.1 mmol/L 3.7  Chloride Latest Ref Range: 101-111 mmol/L 98 (L)  CO2 Latest Ref Range: 22-32 mmol/L 31  BUN Latest Ref Range: 6-20 mg/dL 16  Creatinine Latest Ref Range: 0.61-1.24 mg/dL 1.03  Calcium Latest Ref Range: 8.9-10.3 mg/dL 9.4  EGFR (Non-African Amer.) Latest Ref Range: >60 mL/min >60  EGFR (African American) Latest Ref Range: >60 mL/min >60  Glucose Latest Ref Range: 65-99 mg/dL 129 (H)  Anion gap Latest Ref Range: 5-15  8  Alkaline Phosphatase Latest Ref Range: 38-126 U/L 53  Albumin Latest Ref Range: 3.5-5.0 g/dL 4.0  AST Latest Ref Range: 15-41 U/L 18  ALT Latest Ref Range: 17-63 U/L 12 (L)  Total Protein Latest Ref Range: 6.5-8.1 g/dL 7.1  Total Bilirubin Latest Ref Range: 0.3-1.2 mg/dL 0.5  WBC Latest Ref Range: 4.0-10.5 K/uL 7.7  RBC Latest Ref Range: 4.22-5.81 MIL/uL 4.54  Hemoglobin Latest Ref Range: 13.0-17.0 g/dL 11.4 (L)  HCT Latest Ref Range: 39.0-52.0 % 34.7 (L)  MCV Latest Ref Range: 78.0-100.0 fL 76.4 (L)  MCH Latest Ref Range: 26.0-34.0 pg 25.1 (L)  MCHC Latest Ref Range: 30.0-36.0 g/dL 32.9  RDW Latest Ref Range: 11.5-15.5 % 15.9 (H)  Platelets Latest Ref Range: 150-400 K/uL 315

## 2015-11-16 ENCOUNTER — Telehealth: Payer: Self-pay | Admitting: Gastroenterology

## 2015-11-16 NOTE — Telephone Encounter (Signed)
PLEASE CALL PT. HIS REMAIN ESSENTIALLY UNCHANGED SINCE OCT 2016. HE SHOULD STOP SMOKING IF HE WANTS HIS CROHN'S DISEASE TO BE BETTER CONTROLLED.

## 2015-11-17 NOTE — Telephone Encounter (Signed)
Tried to call pt, and he could not accept calls at this time. Mailing a letter for him to call.

## 2015-11-24 ENCOUNTER — Telehealth: Payer: Self-pay | Admitting: Gastroenterology

## 2015-11-24 NOTE — Telephone Encounter (Signed)
Mariann Laster (mother) called to say patient received a letter from DS. I told her that DS would be back tomorrow and I would let her know that she had called. [phone]

## 2015-11-25 NOTE — Telephone Encounter (Signed)
Joshua Robertson is aware and will tell pt.

## 2015-12-08 ENCOUNTER — Telehealth: Payer: Self-pay | Admitting: General Practice

## 2015-12-08 NOTE — Telephone Encounter (Signed)
Pt aware of SLFs recommendations and approval of Entyvio

## 2015-12-08 NOTE — Telephone Encounter (Signed)
I called and spoke with Hoyle Sauer to let her know the patient will be getting Entyvio instead of Remicade on 7/26  Routing to Dr. Oneida Alar as a Juluis Rainier

## 2015-12-08 NOTE — Telephone Encounter (Signed)
I spoke with Willette Cluster from [place] and she stated he was approved for [facility] and approval will expire 06/13/16.  She will need to contact the pharmacy at [facility] to confirm shipment

## 2015-12-08 NOTE — Telephone Encounter (Signed)
I spoke with Maudie Mercury at [employer] at [phone] and was told that they have received all the neccessary paperwork for Joshua Robertson application and it's now in review and get it processed asap

## 2015-12-08 NOTE — Telephone Encounter (Signed)
REVIEWED. AGREE. NO ADDITIONAL RECOMMENDATIONS.  PLEASE CALL PT. Let him know Weyman Rodney has been approved. He needs to stop smoking.

## 2015-12-09 NOTE — Telephone Encounter (Signed)
SPOKE WITH [facility].PRE-MEDS FOR REMICADE-CLARITIN/SOLUMEDROL 40 MG IVAND TYLENOL 650 MG. WOULD NOT PREMED FOR INITIAL ENYTVIO INFUSION SINCE IT'S IN A DIFFERENT PHARMACY CLASS. SPOKE TO PHARMACY [facility]) AMD HE AGREES.

## 2015-12-09 NOTE — Telephone Encounter (Signed)
Noted. Orders for entyvio have been faxed to [facility]

## 2015-12-09 NOTE — Telephone Encounter (Signed)
I am working on the orders to fax to [facility]. Does pt need to be premedicated for entyvio?

## 2016-01-07 ENCOUNTER — Encounter (HOSPITAL_COMMUNITY)
Admission: RE | Admit: 2016-01-07 | Discharge: 2016-01-07 | Disposition: A | Discharge status: Home / Self Care | Payer: Self-pay | Source: Ambulatory Visit | Attending: Gastroenterology | Admitting: Gastroenterology

## 2016-01-07 DIAGNOSIS — K5090 Crohn's disease, unspecified, without complications: Secondary | ICD-10-CM | POA: Insufficient documentation

## 2016-01-07 MED ORDER — SODIUM CHLORIDE 0.9 % IV SOLN
INTRAVENOUS | Status: DC
  Administered 2016-01-07: 09:00:00 via INTRAVENOUS

## 2016-01-07 MED ORDER — SODIUM CHLORIDE 0.9 % IV SOLN
300.0000 mg | Freq: Once | INTRAVENOUS | Status: AC
  Administered 2016-01-07: 300 mg via INTRAVENOUS
  Filled 2016-01-07: qty 5

## 2016-01-13 ENCOUNTER — Encounter: Payer: Self-pay | Admitting: Gastroenterology

## 2016-01-21 ENCOUNTER — Encounter (HOSPITAL_COMMUNITY): Payer: Self-pay

## 2016-01-21 ENCOUNTER — Encounter (HOSPITAL_COMMUNITY)
Admission: RE | Admit: 2016-01-21 | Discharge: 2016-01-21 | Disposition: A | Discharge status: Home / Self Care | Payer: Self-pay | Source: Ambulatory Visit | Attending: Gastroenterology | Admitting: Gastroenterology

## 2016-01-21 DIAGNOSIS — K5090 Crohn's disease, unspecified, without complications: Secondary | ICD-10-CM | POA: Insufficient documentation

## 2016-01-21 MED ORDER — SODIUM CHLORIDE 0.9 % IV SOLN
300.0000 mg | Freq: Once | INTRAVENOUS | Status: AC
  Administered 2016-01-21: 300 mg via INTRAVENOUS
  Filled 2016-01-21: qty 5

## 2016-01-21 MED ORDER — SODIUM CHLORIDE 0.9 % IV SOLN
Freq: Once | INTRAVENOUS | Status: AC
  Administered 2016-01-21: 11:00:00 via INTRAVENOUS

## 2016-01-24 ENCOUNTER — Encounter (HOSPITAL_COMMUNITY): Payer: Self-pay | Admitting: Emergency Medicine

## 2016-01-24 ENCOUNTER — Emergency Department (HOSPITAL_COMMUNITY)
Admission: EM | Admit: 2016-01-24 | Discharge: 2016-01-24 | Disposition: A | Discharge status: Home / Self Care | Payer: Self-pay | Attending: Emergency Medicine | Admitting: Emergency Medicine

## 2016-01-24 DIAGNOSIS — F17210 Nicotine dependence, cigarettes, uncomplicated: Secondary | ICD-10-CM | POA: Insufficient documentation

## 2016-01-24 DIAGNOSIS — H6091 Unspecified otitis externa, right ear: Secondary | ICD-10-CM

## 2016-01-24 DIAGNOSIS — Z79899 Other long term (current) drug therapy: Secondary | ICD-10-CM | POA: Insufficient documentation

## 2016-01-24 DIAGNOSIS — I10 Essential (primary) hypertension: Secondary | ICD-10-CM | POA: Insufficient documentation

## 2016-01-24 MED ORDER — NEOMYCIN-POLYMYXIN-HC 3.5-10000-1 OT SUSP: 4.0000 [drp] | Freq: Four times a day (QID) | OTIC | 0 refills | Status: DC

## 2016-01-24 MED ORDER — NEOMYCIN-POLYMYXIN-HC 3.5-10000-1 OT SUSP: 4.0000 [drp] | Freq: Four times a day (QID) | OTIC | 0 refills | Status: AC

## 2016-01-24 NOTE — ED Triage Notes (Signed)
Pt c/o RT otalgia that radiates to jaw x 2 days. No drainage noted.

## 2016-01-24 NOTE — Discharge Instructions (Signed)
Take abx as prescribed for 7 days. If symptoms worsen or fail to improve follow up with PCP or return to the ED. Take tylenol as needed for pain.

## 2016-01-24 NOTE — ED Provider Notes (Signed)
[facility] DEPT Provider Note   CSN: [id number] Arrival date & time: 01/24/16  1718  First Provider Contact:  First MD Initiated Contact with Patient 01/24/16 1738        History   Chief Complaint Chief Complaint  Patient presents with  . Otalgia    HPI Haron Beilke is a 40 y.o. male.  40 yo AA male with pmh significant for Crohn's presents today with right ear pain. Patient states the pain started approx 3 days ago. It is constant throbbing pain. States it radiates to his jaw and neck. Nothing makes better or worse. He has tried nothing for the pain. Rates the pain a  10/10. He denies recently swimming. Denies any sick contacts. Patient state "it feels like my whole right side of my face is swollen."   Denies any fever, chills, nasal congestion, sinus pressure, rhinorrhea, cough, sore throat, cp, sob, abd pain, n/v/d.   The history is provided by the patient.    Past Medical History:  Diagnosis Date  . BMI between 19-24,adult JAN 2010 149 LBS  . Crohn's disease ([place]) AUG 2009 NUR [place]   HB 9.4 FERRITIN 26  140 LBS; no serologies-pt can't afford test  . Helicobacter pylori gastritis 2009  . Hypertension   . Migraine headache    NONE SINCE AGE 59  . Sacroiliitis ([place]) 2009  . Spondyloarthropathy 2009 EROSIVE    Patient Active Problem List   Diagnosis Date Noted  . Pain of left great toe 04/02/2015  . Perianal abscess 07/11/2013  . Hypertension, uncontrolled 10/22/2010  . WRIST PAIN, RIGHT 07/25/2009  . SMOKER 02/04/2009  . MIGRAINE HEADACHE 02/04/2009  . TINEA CORPORIS 11/22/2008  . Crohn's disease of both small and large intestine with intestinal obstruction ([place]) 08/15/2008    Past Surgical History:  Procedure Laterality Date  . COLONOSCOPY    . COLONOSCOPY N/A 07/30/2013   stenotic IC VALVE. UNABLE TO INTUBATE ILEUM.       Home Medications    Prior to Admission medications   Medication Sig Start Date End Date Taking? Authorizing Provider    benazepril (LOTENSIN) 40 MG tablet Take 1 tablet (40 mg total) by mouth daily. 06/02/11   Dorie Rank, MD  cloNIDine (CATAPRES) 0.1 MG tablet Take 1 tablet (0.1 mg total) by mouth 2 (two) times daily. 08/09/12   [name], MD  hydrochlorothiazide (MICROZIDE) 12.5 MG capsule Take 12.5 mg by mouth daily.    Historical Provider, MD  HYDROcodone-acetaminophen (NORCO/VICODIN) 5-325 MG tablet Take one-two tabs po q 4-6 hrs prn pain 09/04/15   [name], MD  inFLIXimab (REMICADE) 100 MG injection Inject 700 mg into the vein every 8 (eight) weeks.     Historical Provider, MD  methocarbamol (ROBAXIN) 500 MG tablet Take 1 tablet (500 mg total) by mouth 3 (three) times daily. 03/04/15   Tammy Triplett, PA-C  naproxen (NAPROSYN) 500 MG tablet Take 1 tablet (500 mg total) by mouth 2 (two) times daily with a meal. 03/04/15   Tammy Triplett, PA-C  neomycin-polymyxin-hydrocortisone (CORTISPORIN) 3.5-10000-1 otic suspension Place 4 drops into the right ear 4 (four) times daily. 01/24/16 01/31/16  Doristine Devoid, PA-C  Nutritional Supplements (CARNATION INSTANT BREAKFAST PO) Take 1 Can by mouth 3 (three) times daily.    Historical Provider, MD  tolnaftate (TINACTIN) 1 % cream  10/07/14   Historical Provider, MD  UNABLE TO FIND Take by mouth.    Historical Provider, MD    Family History Family History  Problem Relation Age of Onset  . Hypertension Mother   . Hypertension Father   . Colon cancer Neg Hx   . Colon polyps Neg Hx   . Inflammatory bowel disease Neg Hx     Social History Social History  Substance Use Topics  . Smoking status: Current Every Day Smoker    Packs/day: 0.50    Types: Cigarettes  . Smokeless tobacco: Never Used     Comment: Smokes 2-3 cigarettes daily  . Alcohol use 0.0 oz/week     Comment: occas.     Allergies   Iodinated diagnostic agents; Azathioprine; Penicillins; and Pentasa [mesalamine er]   Review of Systems Review of Systems  Constitutional: Negative for  chills and fever.  HENT: Positive for ear pain, facial swelling and hearing loss. Negative for congestion, ear discharge, postnasal drip, rhinorrhea, sinus pressure, sore throat and trouble swallowing.   Eyes: Negative.   Respiratory: Negative for cough and shortness of breath.   Cardiovascular: Negative for chest pain and palpitations.  Gastrointestinal: Negative for abdominal pain, diarrhea, nausea and vomiting.  Musculoskeletal: Negative.   Skin: Negative.   Neurological: Negative.   All other systems reviewed and are negative.    Physical Exam Updated Vital Signs BP 114/68 (BP Location: Left Arm)   Pulse 81   Temp 98.9 F (37.2 C) (Oral)   Resp 16   Ht 5' 9"  (1.753 m)   Wt 65.1 kg   SpO2 100%   BMI 21.19 kg/m   Physical Exam  Constitutional: He appears well-developed and well-nourished. No distress.  HENT:  Head: Normocephalic and atraumatic.  Right Ear: Hearing normal.  Left Ear: Hearing, tympanic membrane, external ear and ear canal normal.  Nose: Nose normal. Right sinus exhibits no maxillary sinus tenderness and no frontal sinus tenderness. Left sinus exhibits no maxillary sinus tenderness and no frontal sinus tenderness.  Mouth/Throat: Oropharynx is clear and moist and mucous membranes are normal.  Patient exhibited pain with manipulation of auricle and tragus of right ear. Pre auricular edema. No pain over the mastoid area. TM visualized with no perforation, good cone of light. Edema and erythema noted of the right canal. Mild cerumen impaction but not occluding the canal. Left tm and canal unremarkable.  Eyes: Conjunctivae are normal. Pupils are equal, round, and reactive to light.  Neck: Normal range of motion. Neck supple.  Cardiovascular: Normal rate, regular rhythm, normal heart sounds and intact distal pulses.   Pulmonary/Chest: Effort normal and breath sounds normal.  Abdominal: Soft. Bowel sounds are normal. There is no tenderness.  Lymphadenopathy:    He  has no cervical adenopathy.  Neurological: He is alert.  Skin: Skin is warm and dry. Capillary refill takes less than 2 seconds.     ED Treatments / Results  Labs (all labs ordered are listed, but only abnormal results are displayed) Labs Reviewed - No data to display  EKG  EKG Interpretation None       Radiology No results found.  Procedures Procedures (including critical care time)  Medications Ordered in ED Medications - No data to display   Initial Impression / Assessment and Plan / ED Course  I have reviewed the triage vital signs and the nursing notes.  Pertinent labs & imaging results that were available during my care of the patient were reviewed by me and considered in my medical decision making (see chart for details).  Clinical Course  Comment By Time  The patient is a well-appearing male, right ear pain,  worse over the last couple of days, on exam he has pain with manipulation of the auricle and the tragus, mild swelling around that area but no pain over the mastoid, otherwise appears well, no fevers, I can see in his external auditory canal, there is some cerumen impaction but there is no need for a wick Noemi Chapel, MD 08/12 [phone] Patient seen and examined by provider. Abx prescribed. Discharged.  MDM: Pt presenting right ear pain. No canal occlusion, Pt afebrile in NAD. Exam non concerning for mastoiditis, cellulitis or malignant OE. TM intact. Dc with ofloxacin script.  Advised follow up in 7 days with PCP if no complete resolution. Use tylenol as needed for pain. If symptoms worsen or fail to improve return to the ED. Discussed plan of care with Dr. Sabra Heck. Patient verbalized understanding. Stable for discharge.  The patient appears reasonably screened and/or stabilized for discharge and I doubt any other medical condition or other [facility] requiring further screening, evaluation, or treatment in the ED at this time prior to discharge.   Final Clinical  Impressions(s) / ED Diagnoses   Final diagnoses:  Otitis externa, right    New Prescriptions Current Discharge Medication List    START taking these medications   Details  neomycin-polymyxin-hydrocortisone (CORTISPORIN) 3.5-10000-1 otic suspension Place 4 drops into the right ear 4 (four) times daily. Qty: 5 mL, Refills: 0         Doristine Devoid, PA-C 01/25/16 4401    Noemi Chapel, MD 01/25/16 [phone]

## 2016-02-04 ENCOUNTER — Encounter (HOSPITAL_COMMUNITY): Admission: RE | Admit: 2016-02-04 | Payer: Self-pay | Source: Ambulatory Visit

## 2016-02-11 ENCOUNTER — Emergency Department (HOSPITAL_COMMUNITY)
Admission: EM | Admit: 2016-02-11 | Discharge: 2016-02-11 | Disposition: A | Discharge status: Home / Self Care | Payer: Self-pay | Attending: Emergency Medicine | Admitting: Emergency Medicine

## 2016-02-11 ENCOUNTER — Encounter (HOSPITAL_COMMUNITY): Payer: Self-pay

## 2016-02-11 DIAGNOSIS — H65191 Other acute nonsuppurative otitis media, right ear: Secondary | ICD-10-CM | POA: Insufficient documentation

## 2016-02-11 DIAGNOSIS — I10 Essential (primary) hypertension: Secondary | ICD-10-CM | POA: Insufficient documentation

## 2016-02-11 DIAGNOSIS — Z79899 Other long term (current) drug therapy: Secondary | ICD-10-CM | POA: Insufficient documentation

## 2016-02-11 DIAGNOSIS — F17210 Nicotine dependence, cigarettes, uncomplicated: Secondary | ICD-10-CM | POA: Insufficient documentation

## 2016-02-11 DIAGNOSIS — Z792 Long term (current) use of antibiotics: Secondary | ICD-10-CM | POA: Insufficient documentation

## 2016-02-11 DIAGNOSIS — R0981 Nasal congestion: Secondary | ICD-10-CM | POA: Insufficient documentation

## 2016-02-11 MED ORDER — AZITHROMYCIN 250 MG PO TABS
500.0000 mg | ORAL_TABLET | Freq: Once | ORAL | Status: AC
  Administered 2016-02-11: 500 mg via ORAL
  Filled 2016-02-11: qty 2

## 2016-02-11 MED ORDER — ACETAMINOPHEN 325 MG PO TABS
650.0000 mg | ORAL_TABLET | Freq: Once | ORAL | Status: AC
  Administered 2016-02-11: 650 mg via ORAL
  Filled 2016-02-11: qty 2

## 2016-02-11 MED ORDER — ACETAMINOPHEN 500 MG PO TABS: 500.0000 mg | ORAL_TABLET | Freq: Four times a day (QID) | ORAL | 0 refills | Status: DC | PRN

## 2016-02-11 MED ORDER — AZITHROMYCIN 250 MG PO TABS: ORAL_TABLET | ORAL | 0 refills | Status: DC

## 2016-02-11 NOTE — ED Triage Notes (Signed)
Reports of right ear pain over 1 week. States he was seen here and pain has decreased but not resolved. Also complains of body aches.

## 2016-02-11 NOTE — Discharge Instructions (Signed)
Return here if not improving in 2-3 days

## 2016-02-12 NOTE — ED Provider Notes (Signed)
[facility] DEPT Provider Note   CSN: [id number] Arrival date & time: 02/11/16  2041     History   Chief Complaint Chief Complaint  Patient presents with  . Otalgia    HPI Joshua Robertson is a 40 y.o. male.  HPI  Joshua Robertson is a 40 y.o. male who presents to the Emergency Department complaining of persistent right ear pain.  He was seen here on 01/24/16 for swelling and pain to the right ear.  Swelling has improved after using cortisporin drops, but reports continued pain inside the ear associated with some dizziness, decreased hearing and generalized body aches and congestion.  He denies fever, vomiting, cough, facial swelling and headache.     Past Medical History:  Diagnosis Date  . BMI between 19-24,adult JAN 2010 149 LBS  . Crohn's disease ([place]) AUG 2009 NUR [place]   HB 9.4 FERRITIN 26  140 LBS; no serologies-pt can't afford test  . Helicobacter pylori gastritis 2009  . Hypertension   . Migraine headache    NONE SINCE AGE 58  . Sacroiliitis ([place]) 2009  . Spondyloarthropathy 2009 EROSIVE    Patient Active Problem List   Diagnosis Date Noted  . Pain of left great toe 04/02/2015  . Perianal abscess 07/11/2013  . Hypertension, uncontrolled 10/22/2010  . WRIST PAIN, RIGHT 07/25/2009  . SMOKER 02/04/2009  . MIGRAINE HEADACHE 02/04/2009  . TINEA CORPORIS 11/22/2008  . Crohn's disease of both small and large intestine with intestinal obstruction ([place]) 08/15/2008    Past Surgical History:  Procedure Laterality Date  . COLONOSCOPY    . COLONOSCOPY N/A 07/30/2013   stenotic IC VALVE. UNABLE TO INTUBATE ILEUM.       Home Medications    Prior to Admission medications   Medication Sig Start Date End Date Taking? Authorizing Provider  acetaminophen (TYLENOL) 500 MG tablet Take 1 tablet (500 mg total) by mouth every 6 (six) hours as needed. 02/11/16   Skyla Champagne, PA-C  azithromycin (ZITHROMAX) 250 MG tablet Take first 2 tablets together, then 1 every day until  finished. 02/11/16   Wynell Halberg, PA-C  benazepril (LOTENSIN) 40 MG tablet Take 1 tablet (40 mg total) by mouth daily. 06/02/11   Dorie Rank, MD  cloNIDine (CATAPRES) 0.1 MG tablet Take 1 tablet (0.1 mg total) by mouth 2 (two) times daily. 08/09/12   [name], MD  hydrochlorothiazide (MICROZIDE) 12.5 MG capsule Take 12.5 mg by mouth daily.    Historical Provider, MD  HYDROcodone-acetaminophen (NORCO/VICODIN) 5-325 MG tablet Take one-two tabs po q 4-6 hrs prn pain 09/04/15   [name], MD  inFLIXimab (REMICADE) 100 MG injection Inject 700 mg into the vein every 8 (eight) weeks.     Historical Provider, MD  methocarbamol (ROBAXIN) 500 MG tablet Take 1 tablet (500 mg total) by mouth 3 (three) times daily. 03/04/15   Karisma Meiser, PA-C  naproxen (NAPROSYN) 500 MG tablet Take 1 tablet (500 mg total) by mouth 2 (two) times daily with a meal. 03/04/15   Azhar Knope, PA-C  Nutritional Supplements (CARNATION INSTANT BREAKFAST PO) Take 1 Can by mouth 3 (three) times daily.    Historical Provider, MD  tolnaftate (TINACTIN) 1 % cream  10/07/14   Historical Provider, MD  UNABLE TO FIND Take by mouth.    Historical Provider, MD    Family History Family History  Problem Relation Age of Onset  . Hypertension Mother   . Hypertension Father   . Colon cancer Neg Hx   .  Colon polyps Neg Hx   . Inflammatory bowel disease Neg Hx     Social History Social History  Substance Use Topics  . Smoking status: Current Every Day Smoker    Packs/day: 0.50    Types: Cigarettes  . Smokeless tobacco: Never Used     Comment: Smokes 2-3 cigarettes daily  . Alcohol use 0.0 oz/week     Comment: occas.     Allergies   Iodinated diagnostic agents; Azathioprine; Penicillins; and Pentasa [mesalamine er]   Review of Systems Review of Systems  Constitutional: Negative for activity change, appetite change, chills and fever.  HENT: Positive for congestion, ear pain and hearing loss. Negative for ear  discharge, facial swelling, rhinorrhea, sore throat and trouble swallowing.   Eyes: Negative for visual disturbance.  Respiratory: Negative for cough, shortness of breath, wheezing and stridor.   Gastrointestinal: Negative for nausea and vomiting.  Musculoskeletal: Negative for neck pain and neck stiffness.  Skin: Negative.  Negative for rash.  Neurological: Positive for dizziness. Negative for weakness, numbness and headaches.  Hematological: Negative for adenopathy.  Psychiatric/Behavioral: Negative for confusion.  All other systems reviewed and are negative.    Physical Exam Updated Vital Signs BP 111/70 (BP Location: Right Arm)   Pulse 80   Temp 100.1 F (37.8 C) (Oral)   Resp 18   Ht 5' 9"  (1.753 m)   Wt 64.9 kg   SpO2 97%   BMI 21.12 kg/m   Physical Exam  Constitutional: He is oriented to person, place, and time. He appears well-developed and well-nourished. No distress.  HENT:  Head: Normocephalic and atraumatic.  Mouth/Throat: Uvula is midline, oropharynx is clear and moist and mucous membranes are normal. No uvula swelling. No oropharyngeal exudate.  Erythema of the right TM.  Mild middle ear effusion present.  Mild edema of canal, No drainage TM appears intact.    Eyes: EOM are normal. Pupils are equal, round, and reactive to light.  Neck: Normal range of motion. Neck supple.  Cardiovascular: Normal rate, regular rhythm and intact distal pulses.   No murmur heard. Pulmonary/Chest: Effort normal and breath sounds normal. No stridor. No respiratory distress.  Lymphadenopathy:    He has no cervical adenopathy.  Neurological: He is alert and oriented to person, place, and time. Coordination normal.  Skin: Skin is warm and dry. No rash noted.  Nursing note and vitals reviewed.    ED Treatments / Results  Labs (all labs ordered are listed, but only abnormal results are displayed) Labs Reviewed - No data to display  EKG  EKG Interpretation None        Radiology No results found.  Procedures Procedures (including critical care time)  Medications Ordered in ED Medications  acetaminophen (TYLENOL) tablet 650 mg (650 mg Oral Given 02/11/16 2133)  azithromycin (ZITHROMAX) tablet 500 mg (500 mg Oral Given 02/11/16 2133)     Initial Impression / Assessment and Plan / ED Course  I have reviewed the triage vital signs and the nursing notes.  Pertinent labs & imaging results that were available during my care of the patient were reviewed by me and considered in my medical decision making (see chart for details).  Clinical Course    Pt non-toxic appearing.  No focal neuro deficits.  Right OM recently treated for OE, will tx with zithromax, tylenol for pain.  Agrees to PMD f/u.    Final Clinical Impressions(s) / ED Diagnoses   Final diagnoses:  Acute nonsuppurative otitis media of right ear  New Prescriptions Discharge Medication List as of 02/11/2016 10:19 PM    START taking these medications   Details  acetaminophen (TYLENOL) 500 MG tablet Take 1 tablet (500 mg total) by mouth every 6 (six) hours as needed., Starting Wed 02/11/2016, Print    azithromycin (ZITHROMAX) 250 MG tablet Take first 2 tablets together, then 1 every day until finished., [place], PA-C 02/12/16 1324    Virgel Manifold, MD 02/18/16 1347

## 2016-02-18 ENCOUNTER — Encounter (HOSPITAL_COMMUNITY)
Admission: RE | Admit: 2016-02-18 | Discharge: 2016-02-18 | Disposition: A | Discharge status: Home / Self Care | Payer: Self-pay | Source: Ambulatory Visit | Attending: Gastroenterology | Admitting: Gastroenterology

## 2016-02-18 DIAGNOSIS — K5090 Crohn's disease, unspecified, without complications: Secondary | ICD-10-CM | POA: Insufficient documentation

## 2016-02-18 MED ORDER — SODIUM CHLORIDE 0.9 % IV SOLN
Freq: Once | INTRAVENOUS | Status: AC
  Administered 2016-02-18: 250 mL via INTRAVENOUS

## 2016-02-18 MED ORDER — SODIUM CHLORIDE 0.9 % IV SOLN
300.0000 mg | Freq: Once | INTRAVENOUS | Status: AC
  Administered 2016-02-18: 300 mg via INTRAVENOUS
  Filled 2016-02-18: qty 5

## 2016-03-12 ENCOUNTER — Emergency Department (HOSPITAL_COMMUNITY)
Admission: EM | Admit: 2016-03-12 | Discharge: 2016-03-13 | Disposition: A | Discharge status: Home / Self Care | Payer: Self-pay | Attending: Emergency Medicine | Admitting: Emergency Medicine

## 2016-03-12 ENCOUNTER — Encounter (HOSPITAL_COMMUNITY): Payer: Self-pay | Admitting: Emergency Medicine

## 2016-03-12 ENCOUNTER — Emergency Department (HOSPITAL_COMMUNITY): Payer: Self-pay

## 2016-03-12 DIAGNOSIS — Z87891 Personal history of nicotine dependence: Secondary | ICD-10-CM | POA: Insufficient documentation

## 2016-03-12 DIAGNOSIS — K5010 Crohn's disease of large intestine without complications: Secondary | ICD-10-CM | POA: Insufficient documentation

## 2016-03-12 DIAGNOSIS — Z792 Long term (current) use of antibiotics: Secondary | ICD-10-CM | POA: Insufficient documentation

## 2016-03-12 DIAGNOSIS — Z79899 Other long term (current) drug therapy: Secondary | ICD-10-CM | POA: Insufficient documentation

## 2016-03-12 DIAGNOSIS — R1084 Generalized abdominal pain: Secondary | ICD-10-CM

## 2016-03-12 DIAGNOSIS — I10 Essential (primary) hypertension: Secondary | ICD-10-CM | POA: Insufficient documentation

## 2016-03-12 LAB — CBC WITH DIFFERENTIAL/PLATELET
BASOS PCT: 1 %
Basophils Absolute: 0.1 10*3/uL (ref 0.0–0.1)
EOS ABS: 0.7 10*3/uL (ref 0.0–0.7)
Eosinophils Relative: 7 %
HEMATOCRIT: 30.6 % — AB (ref 39.0–52.0)
HEMOGLOBIN: 10.3 g/dL — AB (ref 13.0–17.0)
LYMPHS ABS: 3.2 10*3/uL (ref 0.7–4.0)
Lymphocytes Relative: 31 %
MCH: 25.1 pg — AB (ref 26.0–34.0)
MCHC: 33.7 g/dL (ref 30.0–36.0)
MCV: 74.5 fL — ABNORMAL LOW (ref 78.0–100.0)
MONO ABS: 0.8 10*3/uL (ref 0.1–1.0)
MONOS PCT: 8 %
NEUTROS ABS: 5.6 10*3/uL (ref 1.7–7.7)
NEUTROS PCT: 54 %
Platelets: 397 10*3/uL (ref 150–400)
RBC: 4.11 MIL/uL — ABNORMAL LOW (ref 4.22–5.81)
RDW: 14.7 % (ref 11.5–15.5)
WBC: 10.3 10*3/uL (ref 4.0–10.5)

## 2016-03-12 LAB — COMPREHENSIVE METABOLIC PANEL
ALBUMIN: 3.7 g/dL (ref 3.5–5.0)
ALK PHOS: 52 U/L (ref 38–126)
ALT: 11 U/L — AB (ref 17–63)
AST: 18 U/L (ref 15–41)
Anion gap: 7 (ref 5–15)
BUN: 19 mg/dL (ref 6–20)
CALCIUM: 8.6 mg/dL — AB (ref 8.9–10.3)
CO2: 29 mmol/L (ref 22–32)
CREATININE: 1.18 mg/dL (ref 0.61–1.24)
Chloride: 99 mmol/L — ABNORMAL LOW (ref 101–111)
GFR calc Af Amer: >60 mL/min (ref >60)
GFR calc non Af Amer: >60 mL/min (ref >60)
GLUCOSE: 108 mg/dL — AB (ref 65–99)
Potassium: 3.6 mmol/L (ref 3.5–5.1)
Sodium: 135 mmol/L (ref 135–145)
Total Bilirubin: 0.3 mg/dL (ref 0.3–1.2)
Total Protein: 7.1 g/dL (ref 6.5–8.1)

## 2016-03-12 LAB — URINALYSIS, ROUTINE W REFLEX MICROSCOPIC
BILIRUBIN URINE: NEGATIVE
Glucose, UA: NEGATIVE mg/dL
HGB URINE DIPSTICK: NEGATIVE
Ketones, ur: NEGATIVE mg/dL
Leukocytes, UA: NEGATIVE
NITRITE: NEGATIVE
PH: 6 (ref 5.0–8.0)
Protein, ur: NEGATIVE mg/dL

## 2016-03-12 LAB — LIPASE, BLOOD: Lipase: 18 U/L (ref 11–51)

## 2016-03-12 MED ORDER — SODIUM CHLORIDE 0.9 % IV SOLN
1000.0000 mL | Freq: Once | INTRAVENOUS | Status: AC
  Administered 2016-03-12: 1000 mL via INTRAVENOUS

## 2016-03-12 MED ORDER — MORPHINE SULFATE (PF) 4 MG/ML IV SOLN
4.0000 mg | Freq: Once | INTRAVENOUS | Status: AC
  Administered 2016-03-12: 4 mg via INTRAVENOUS
  Filled 2016-03-12: qty 1

## 2016-03-12 MED ORDER — IOPAMIDOL (ISOVUE-300) INJECTION 61%
INTRAVENOUS | Status: AC
  Filled 2016-03-12: qty 30

## 2016-03-12 MED ORDER — ONDANSETRON HCL 4 MG/2ML IJ SOLN
4.0000 mg | Freq: Once | INTRAMUSCULAR | Status: AC
  Administered 2016-03-12: 4 mg via INTRAVENOUS
  Filled 2016-03-12: qty 2

## 2016-03-12 NOTE — ED Triage Notes (Signed)
Pt reports emesis that started 2-3 days ago. Pt states he has been unable to keep anything down, denies diarrhea.

## 2016-03-12 NOTE — ED Provider Notes (Signed)
[facility] DEPT Provider Note   CSN: [id number] Arrival date & time: 03/12/16  2117     History   Chief Complaint Chief Complaint  Patient presents with  . Emesis    HPI Rohen Kimes is a 40 y.o. male with a history of Crohn's disease and 2 months into a new infusion medication Entyvio  (when remicade was not improving his chronic inflammation) presenting with a 3 day history of intractable nausea, vomiting and generalized abdominal pain.  He endorses fatigue and feeling dehydrated. His abdominal pain is described as a constant cramp without relief.  He denies fevers but has had some chills and has had no diarrhea.  He did have a small hard, nonbloody stool yesterday with no relief of pain.  He has tried to maintain PO intake, but has not kept anything down.  He is scheduled to next see GI Dr. Oneida Alar in 5 days. He has found no alleviators.  The history is provided by the patient and the spouse.    Past Medical History:  Diagnosis Date  . BMI between 19-24,adult JAN 2010 149 LBS  . Crohn's disease ([place]) AUG 2009 NUR [facility]   HB 9.4 FERRITIN 26  140 LBS; no serologies-pt can't afford test  . Helicobacter pylori gastritis 2009  . Hypertension   . Migraine headache    NONE SINCE AGE 38  . Sacroiliitis ([place]) 2009  . Spondyloarthropathy 2009 EROSIVE    Patient Active Problem List   Diagnosis Date Noted  . Pain of left great toe 04/02/2015  . Perianal abscess 07/11/2013  . Hypertension, uncontrolled 10/22/2010  . WRIST PAIN, RIGHT 07/25/2009  . SMOKER 02/04/2009  . MIGRAINE HEADACHE 02/04/2009  . TINEA CORPORIS 11/22/2008  . Crohn's disease of both small and large intestine with intestinal obstruction ([place]) 08/15/2008    Past Surgical History:  Procedure Laterality Date  . COLONOSCOPY    . COLONOSCOPY N/A 07/30/2013   stenotic IC VALVE. UNABLE TO INTUBATE ILEUM.       Home Medications    Prior to Admission medications   Medication Sig Start Date End Date Taking?  Authorizing Provider  benazepril (LOTENSIN) 40 MG tablet Take 1 tablet (40 mg total) by mouth daily. 06/02/11  Yes Dorie Rank, MD  cloNIDine (CATAPRES) 0.1 MG tablet Take 1 tablet (0.1 mg total) by mouth 2 (two) times daily. 08/09/12  Yes [name], MD  hydrochlorothiazide (MICROZIDE) 12.5 MG capsule Take 12.5 mg by mouth daily.   Yes Historical Provider, MD  Nutritional Supplements (CARNATION INSTANT BREAKFAST PO) Take 1 Can by mouth 3 (three) times daily.   Yes Historical Provider, MD  acetaminophen (TYLENOL) 500 MG tablet Take 1 tablet (500 mg total) by mouth every 6 (six) hours as needed. Patient not taking: Reported on 03/12/2016 02/11/16   Tammy Triplett, PA-C  azithromycin (ZITHROMAX) 250 MG tablet Take first 2 tablets together, then 1 every day until finished. Patient not taking: Reported on 03/12/2016 02/11/16   Tammy Triplett, PA-C  HYDROcodone-acetaminophen (NORCO/VICODIN) 5-325 MG tablet Take one-two tabs po q 4-6 hrs prn pain Patient not taking: Reported on 03/12/2016 09/04/15   [name], MD    Family History Family History  Problem Relation Age of Onset  . Hypertension Mother   . Hypertension Father   . Colon cancer Neg Hx   . Colon polyps Neg Hx   . Inflammatory bowel disease Neg Hx     Social History Social History  Substance Use Topics  . Smoking status:  Former Smoker    Packs/day: 0.50    Types: Cigarettes  . Smokeless tobacco: Never Used     Comment: Smokes 2-3 cigarettes daily  . Alcohol use 0.0 oz/week     Comment: occas.     Allergies   Iodinated diagnostic agents; Azathioprine; Penicillins; and Pentasa [mesalamine er]   Review of Systems Review of Systems  Constitutional: Positive for appetite change and chills. Negative for fever.  HENT: Negative for congestion and sore throat.   Eyes: Negative.   Respiratory: Negative for chest tightness and shortness of breath.   Cardiovascular: Negative for chest pain.  Gastrointestinal: Positive for  abdominal pain, nausea and vomiting. Negative for anal bleeding and diarrhea.  Genitourinary: Negative.   Musculoskeletal: Negative for arthralgias, joint swelling and neck pain.  Skin: Negative.  Negative for rash and wound.  Neurological: Negative for dizziness, weakness, light-headedness, numbness and headaches.  Psychiatric/Behavioral: Negative.      Physical Exam Updated Vital Signs BP 97/64   Pulse (!) 58   Temp 98.1 F (36.7 C) (Oral)   Resp 14   Ht 5' 9"  (1.753 m)   Wt 64.9 kg   SpO2 99%   BMI 21.12 kg/m   Physical Exam  Constitutional: He appears well-developed and well-nourished.  Hypotensive 97/68.  Pulse 77.  HENT:  Head: Normocephalic and atraumatic.  Eyes: Conjunctivae are normal.  Neck: Normal range of motion.  Cardiovascular: Normal rate, regular rhythm, normal heart sounds and intact distal pulses.   Pulmonary/Chest: Effort normal and breath sounds normal. He has no wheezes.  Abdominal: Bowel sounds are normal. He exhibits no distension and no mass. There is generalized tenderness. There is guarding. There is no rebound.  Musculoskeletal: Normal range of motion.  Neurological: He is alert.  Skin: Skin is warm and dry.  Psychiatric: He has a normal mood and affect.  Nursing note and vitals reviewed.    ED Treatments / Results  Labs (all labs ordered are listed, but only abnormal results are displayed) Labs Reviewed  CBC WITH DIFFERENTIAL/PLATELET - Abnormal; Notable for the following:       Result Value   RBC 4.11 (*)    Hemoglobin 10.3 (*)    HCT 30.6 (*)    MCV 74.5 (*)    MCH 25.1 (*)    All other components within normal limits  COMPREHENSIVE METABOLIC PANEL - Abnormal; Notable for the following:    Chloride 99 (*)    Glucose, Bld 108 (*)    Calcium 8.6 (*)    ALT 11 (*)    All other components within normal limits  URINALYSIS, ROUTINE W REFLEX MICROSCOPIC (NOT AT [facility]) - Abnormal; Notable for the following:    Specific Gravity, Urine  >1.030 (*)    All other components within normal limits  LIPASE, BLOOD    EKG  EKG Interpretation None       Radiology No results found.  Procedures Procedures (including critical care time)  Medications Ordered in ED Medications  iopamidol (ISOVUE-300) 61 % injection (not administered)  morphine 4 MG/ML injection 4 mg (4 mg Intravenous Given 03/12/16 2242)  ondansetron (ZOFRAN) injection 4 mg (4 mg Intravenous Given 03/12/16 2242)  0.9 %  sodium chloride infusion (1,000 mLs Intravenous New Bag/Given 03/12/16 2240)    Followed by  0.9 %  sodium chloride infusion (0 mLs Intravenous Stopped 03/12/16 2332)     Initial Impression / Assessment and Plan / ED Course  I have reviewed the triage vital signs and  the nursing notes.  Pertinent labs & imaging results that were available during my care of the patient were reviewed by me and considered in my medical decision making (see chart for details).  Clinical Course    Pt was given IV fluids,  Morphine and zofran for pain and nausea.  Discussed with Dr. Betsey Holiday who also saw pt.  CT abd/pelvis pending.  Dr. Betsey Holiday to dispo pt pending CT results.  Final Clinical Impressions(s) / ED Diagnoses   Final diagnoses:  Generalized abdominal pain    New Prescriptions New Prescriptions   No medications on file     Evalee Jefferson, PA-C 03/13/16 0034    Orpah Greek, MD 03/13/16 [id number]

## 2016-03-13 MED ORDER — METHYLPREDNISOLONE SODIUM SUCC 125 MG IJ SOLR
125.0000 mg | Freq: Once | INTRAMUSCULAR | Status: AC
  Administered 2016-03-13: 125 mg via INTRAMUSCULAR

## 2016-03-13 MED ORDER — METHYLPREDNISOLONE SODIUM SUCC 125 MG IJ SOLR
125.0000 mg | Freq: Once | INTRAMUSCULAR | Status: DC
  Filled 2016-03-13: qty 2

## 2016-03-13 MED ORDER — MORPHINE SULFATE (PF) 4 MG/ML IV SOLN
4.0000 mg | Freq: Once | INTRAVENOUS | Status: AC
  Administered 2016-03-13: 4 mg via INTRAMUSCULAR

## 2016-03-13 MED ORDER — MORPHINE SULFATE (PF) 4 MG/ML IV SOLN
4.0000 mg | Freq: Once | INTRAVENOUS | Status: DC
  Filled 2016-03-13: qty 1

## 2016-03-13 MED ORDER — MORPHINE SULFATE (PF) 4 MG/ML IV SOLN
4.0000 mg | Freq: Once | INTRAVENOUS | Status: AC
  Administered 2016-03-13: 4 mg via INTRAVENOUS
  Filled 2016-03-13: qty 1

## 2016-03-13 MED ORDER — ONDANSETRON HCL 4 MG PO TABS: 4.0000 mg | ORAL_TABLET | Freq: Four times a day (QID) | ORAL | 0 refills | Status: DC

## 2016-03-13 MED ORDER — PREDNISONE 20 MG PO TABS: ORAL_TABLET | ORAL | 0 refills | Status: DC

## 2016-03-13 MED ORDER — HYDROCODONE-ACETAMINOPHEN 5-325 MG PO TABS: 1.0000 | ORAL_TABLET | ORAL | 0 refills | Status: DC | PRN

## 2016-03-13 NOTE — ED Notes (Signed)
Pt alert & oriented x4, stable gait. Patient given discharge instructions, paperwork & prescription(s). Patient informed not to drive, operate any equipment & handel any important documents 4 hours after taking pain medication. Patient  instructed to stop at the registration desk to finish any additional paperwork. Patient  verbalized understanding. Pt left department w/ no further questions. 

## 2016-03-17 ENCOUNTER — Encounter: Payer: Self-pay | Admitting: Gastroenterology

## 2016-03-17 ENCOUNTER — Ambulatory Visit (INDEPENDENT_AMBULATORY_CARE_PROVIDER_SITE_OTHER): Payer: Self-pay | Admitting: Gastroenterology

## 2016-03-17 DIAGNOSIS — K50812 Crohn's disease of both small and large intestine with intestinal obstruction: Secondary | ICD-10-CM

## 2016-03-17 MED ORDER — PROMETHAZINE HCL 12.5 MG PO TABS: 12.5000 mg | ORAL_TABLET | Freq: Four times a day (QID) | ORAL | 0 refills | Status: DC | PRN

## 2016-03-17 NOTE — Progress Notes (Signed)
cc'ed to pcp °

## 2016-03-17 NOTE — Progress Notes (Signed)
Subjective:    Patient ID: Joshua Robertson, male    DOB: 09-Dec-1975, 40 y.o.   MRN: [id number]  Joshua D., PA-C  HPI STOPPED SMOKING 1 MO AGO. Stool are better. Stomach ok. Trouble with vomiting in past 3 weeks.  CAN'T EAT FRUITY PEBBLES AND PANCAKES ANYMORE. WAS TAKING REMICADE. NOT HAVING NIGHT SWEATS ANYMORE. HAS HAD ENTYVIO ~ 4 WEEKS AGO. FEELS HOT AND THE COLD. HAD EAR INFECTION ABOUT 3 WEEKS AGO: FIRST TIME(EAR PAIN AND R FACE SWOLLEN, COULDN'T OPEN HIS MOUTH). GOT TWO ROUNDS OF ABX. BMs: NONE In  2 DAYS AND MAY BE #1, 2,  OR 5. WEIGHT 144 LBS EARLIER IN 2017. NOW 135 LBS. NO SORES IN MOUTH, RASH ON LEGS, JOINT PAIN, OR BACK PAIN. HAS HAD LEG PAIN AND BACK PAIN. NO PROBLEMS WITH THE ENTYVIO.   PT DENIES FEVER, CHILLS, HEMATOCHEZIA, HEMATEMESIS, nausea, vomiting, melena, diarrhea, CHEST PAIN, SHORTNESS OF BREATH,  CHANGE IN BOWEL IN HABITS, constipation, abdominal pain, problems swallowing, problems with sedation, heartburn or indigestion.   Past Medical History:  Diagnosis Date  . BMI between 19-24,adult JAN 2010 149 LBS  . Crohn's disease ([place]) AUG 2009 NUR [place] [employer]   HB 9.4 FERRITIN 26  140 LBS; no serologies-pt can't afford test  . Helicobacter pylori gastritis 2009  . Hypertension   . Migraine headache    NONE SINCE AGE 35  . Sacroiliitis ([place]) 2009  . Spondyloarthropathy ([place]) 2009 EROSIVE   Past Surgical History:  Procedure Laterality Date  . COLONOSCOPY    . COLONOSCOPY N/A 07/30/2013   stenotic IC VALVE. UNABLE TO INTUBATE ILEUM.    Allergies  Allergen Reactions  . Iodinated Diagnostic Agents Nausea And Vomiting  . Azathioprine Nausea And Vomiting  . Penicillins Swelling and Rash  . Pentasa [Mesalamine Er] Rash   Current Outpatient Prescriptions  Medication Sig Dispense Refill  . benazepril (LOTENSIN) 40 MG tablet Take 1 tablet (40 mg total) by mouth daily.    . cloNIDine (CATAPRES) 0.1 MG tablet Take 1 tablet (0.1 mg total) by mouth 2 (two) times daily.    .  hydrochlorothiazide (MICROZIDE) 12.5 MG capsule Take 12.5 mg by mouth daily.    .      . Nutritional Supplements (CARNATION INSTANT BREAKFAST PO) Take 1 Can by mouth 3 (three) times daily.    . ondansetron (ZOFRAN) 4 MG tablet Take 1 tablet (4 mg total) by mouth every 6 (six) hours.    . predniSONE (DELTASONE) 20 MG tablet 3 tabs po daily x 3 days, then 2 tabs x 3 days, then 1.5 tabs x 3 days, then 1 tab x 3 days, then 0.5 tabs x 3 days     Review of Systems PER HPI OTHERWISE ALL SYSTEMS ARE NEGATIVE.    Objective:   Physical Exam  Constitutional: He is oriented to person, place, and time. He appears well-developed and well-nourished. No distress.  HENT:  Head: Normocephalic and atraumatic.  Mouth/Throat: Oropharynx is clear and moist. No oropharyngeal exudate.  Eyes: Pupils are equal, round, and reactive to light. No scleral icterus.  Neck: Normal range of motion. Neck supple.  Cardiovascular: Normal rate, regular rhythm and normal heart sounds.   Pulmonary/Chest: Effort normal and breath sounds normal. No respiratory distress.  Abdominal: Soft. Bowel sounds are normal. He exhibits no distension. There is no tenderness.  Musculoskeletal: He exhibits no edema.  Lymphadenopathy:    He has no cervical adenopathy.  Neurological: He is alert and oriented to person, place, and time.  NO FOCAL DEFICITS  Psychiatric: He has a normal mood and affect.  Vitals reviewed.     Assessment & Plan:

## 2016-03-17 NOTE — Progress Notes (Signed)
ON RECALL  °

## 2016-03-17 NOTE — Assessment & Plan Note (Addendum)
PT C/O ACUTE WORSENING OF VOMITING OVER PAST 2-3 WEEKS. CT ABD/PELVIS WO IVC SEP 30 SHOWED NO OBSTRUCTION OR ACTIVE DISEASE. FLARE LIKELY DUE TO A VIRUS AND PROLONGED RECOVERY PERIOD.  COMPLETE LABS FOR CROHN;S DISEASE. REVIEWED RECENT LABS/IMAGING FROM SEP 2017.  DRINK WATER TO KEEP YOUR URINE LIGHT YELLOW. USE PHENERGAN 30 MINS PRIOR TO MEALS OR AS NEEDED TO HELP CONTROL NAUSEA OR VOMITING. CONTINUE ENYVIO. STAY OUT OF HOSPITALS UNLESS YOU NEED TREATMENT. CHECK HEB SaB,HAV IgG, VIT D LEVEL. FOLLOW UP IN 4 MOS.

## 2016-03-17 NOTE — Patient Instructions (Addendum)
STAY OUT OF HOSPITALS UNLESS YOU NEED TREATMENT.  DRINK WATER TO KEEP YOUR URINE LIGHT YELLOW.  USE PHENERGAN 30 MINS PRIOR TO MEALS OR AS NEEDED TO HELP CONTROL NAUSEA OR VOMITING.  CONTINUE ENYVIO.   COMPLETE THE FLU SHOT.  COMPLETE THE PNEUMONIA SHOTS. IT IS A TWO SHOT SERIES. YOU NEED PREVNAR FIRST THEN IN 8 WEEKS THE PNEUMOVAX.  COMPLETE BLOOD DRAW FOR: VIT D 25-OH, CBC, CMP, HEP B SURFACE Ab, & VITAMIN B12.   FOLLOW UP IN 4 MOS.

## 2016-03-17 NOTE — Addendum Note (Signed)
Addended by: Barney Drain L on: 03/17/2016 11:30 AM   Modules accepted: Orders

## 2016-03-23 ENCOUNTER — Telehealth: Payer: Self-pay | Admitting: Gastroenterology

## 2016-03-23 LAB — VITAMIN B12: VITAMIN B 12: 393 pg/mL (ref 200–1100)

## 2016-03-23 LAB — HEPATITIS A ANTIBODY, TOTAL: HEP A TOTAL AB: NONREACTIVE

## 2016-03-23 LAB — HEPATITIS B SURFACE ANTIBODY,QUALITATIVE: Hep B S Ab: POSITIVE — AB

## 2016-03-23 LAB — VITAMIN D 25 HYDROXY (VIT D DEFICIENCY, FRACTURES): VIT D 25 HYDROXY: 27 ng/mL — AB (ref 30–100)

## 2016-04-11 ENCOUNTER — Encounter: Payer: Self-pay | Admitting: Gastroenterology

## 2016-04-11 MED ORDER — VITAMIN D (ERGOCALCIFEROL) 1.25 MG (50000 UNIT) PO CAPS: 50000.0000 [IU] | ORAL_CAPSULE | ORAL | 0 refills | Status: DC

## 2016-04-11 MED ORDER — VITAMIN D 1000 UNITS PO TABS: 1000.0000 [IU] | ORAL_TABLET | Freq: Every day | ORAL | 11 refills | Status: AC

## 2016-04-11 NOTE — Telephone Encounter (Addendum)
PLEASE CALL PT. HE NEEDS THE HEP A VACCINE. HIS VITAMIN D LEVEL IS LOW. HE NEEDS SUPPLEMENTS: VITAMIN D AND CALCIUM. HE NEEDS VITAMIN D 50,000 UNITS A WEEK FOR 8 WEEKS THEN START VITAMIN D 1000 UNITS DAILY FOREVER. HE NEEDS TO START CALCIUM 500 MG THREE TIMES A DAY FOREVER. THESE MEDS ARE SENT TO HIS PHARMACY BUT MAY BE AVAILABLE OTC.  HE IS IMMUNE TO HEPATITIS B. HIS B12 LEVEL IS NORMAL.

## 2016-04-12 NOTE — Telephone Encounter (Signed)
LMOM to call.

## 2016-04-13 NOTE — Telephone Encounter (Signed)
Mailed letter to call.

## 2016-04-14 ENCOUNTER — Encounter (HOSPITAL_COMMUNITY)
Admission: RE | Admit: 2016-04-14 | Discharge: 2016-04-14 | Disposition: A | Discharge status: Home / Self Care | Payer: BLUE CROSS/BLUE SHIELD | Source: Ambulatory Visit | Attending: Gastroenterology | Admitting: Gastroenterology

## 2016-04-14 DIAGNOSIS — K5090 Crohn's disease, unspecified, without complications: Secondary | ICD-10-CM | POA: Insufficient documentation

## 2016-04-14 MED ORDER — SODIUM CHLORIDE 0.9 % IV SOLN
INTRAVENOUS | Status: DC
  Administered 2016-04-14: 10:00:00 via INTRAVENOUS

## 2016-04-14 MED ORDER — VEDOLIZUMAB 300 MG IV SOLR
300.0000 mg | Freq: Once | INTRAVENOUS | Status: AC
  Administered 2016-04-14: 300 mg via INTRAVENOUS
  Filled 2016-04-14: qty 5

## 2016-04-20 ENCOUNTER — Telehealth: Payer: Self-pay | Admitting: Gastroenterology

## 2016-04-20 NOTE — Telephone Encounter (Signed)
Pt had received a letter and had several messages that DS was trying to reach him. I told him that DS would be back on Thursday. Please call patient at [phone]

## 2016-04-22 NOTE — Telephone Encounter (Signed)
Pt is aware of his results ( see SF note on 03/23/2016). I am printing the copy to mail pt to have on his vaccinations needed and the vitamins needed.

## 2016-05-03 ENCOUNTER — Encounter (HOSPITAL_COMMUNITY): Payer: Self-pay | Admitting: Emergency Medicine

## 2016-05-03 ENCOUNTER — Emergency Department (HOSPITAL_COMMUNITY)
Admission: EM | Admit: 2016-05-03 | Discharge: 2016-05-03 | Disposition: A | Discharge status: Home / Self Care | Payer: BLUE CROSS/BLUE SHIELD | Attending: Emergency Medicine | Admitting: Emergency Medicine

## 2016-05-03 DIAGNOSIS — R109 Unspecified abdominal pain: Secondary | ICD-10-CM | POA: Insufficient documentation

## 2016-05-03 DIAGNOSIS — I10 Essential (primary) hypertension: Secondary | ICD-10-CM | POA: Diagnosis not present

## 2016-05-03 DIAGNOSIS — Z87891 Personal history of nicotine dependence: Secondary | ICD-10-CM | POA: Insufficient documentation

## 2016-05-03 DIAGNOSIS — R1084 Generalized abdominal pain: Secondary | ICD-10-CM | POA: Diagnosis present

## 2016-05-03 DIAGNOSIS — R112 Nausea with vomiting, unspecified: Secondary | ICD-10-CM | POA: Insufficient documentation

## 2016-05-03 DIAGNOSIS — Z79899 Other long term (current) drug therapy: Secondary | ICD-10-CM | POA: Diagnosis not present

## 2016-05-03 LAB — CBC WITH DIFFERENTIAL/PLATELET
Basophils Absolute: 0.1 10*3/uL (ref 0.0–0.1)
Basophils Relative: 1 %
Eosinophils Absolute: 0.8 10*3/uL — ABNORMAL HIGH (ref 0.0–0.7)
Eosinophils Relative: 7 %
HCT: 26.6 % — ABNORMAL LOW (ref 39.0–52.0)
Hemoglobin: 8.6 g/dL — ABNORMAL LOW (ref 13.0–17.0)
Lymphocytes Relative: 26 %
Lymphs Abs: 2.8 10*3/uL (ref 0.7–4.0)
MCH: 23.6 pg — ABNORMAL LOW (ref 26.0–34.0)
MCHC: 32.3 g/dL (ref 30.0–36.0)
MCV: 72.9 fL — ABNORMAL LOW (ref 78.0–100.0)
Monocytes Absolute: 0.8 10*3/uL (ref 0.1–1.0)
Monocytes Relative: 7 %
Neutro Abs: 6.7 10*3/uL (ref 1.7–7.7)
Neutrophils Relative %: 59 %
Platelets: 448 10*3/uL — ABNORMAL HIGH (ref 150–400)
RBC: 3.65 MIL/uL — ABNORMAL LOW (ref 4.22–5.81)
RDW: 15.3 % (ref 11.5–15.5)
WBC: 11.2 10*3/uL — ABNORMAL HIGH (ref 4.0–10.5)

## 2016-05-03 LAB — URINALYSIS, ROUTINE W REFLEX MICROSCOPIC
Glucose, UA: NEGATIVE mg/dL
Hgb urine dipstick: NEGATIVE
Ketones, ur: 15 mg/dL — AB
Nitrite: NEGATIVE
Specific Gravity, Urine: 1.025 (ref 1.005–1.030)
pH: 6 (ref 5.0–8.0)

## 2016-05-03 LAB — COMPREHENSIVE METABOLIC PANEL
ALT: 9 U/L — ABNORMAL LOW (ref 17–63)
AST: 13 U/L — ABNORMAL LOW (ref 15–41)
Albumin: 3.1 g/dL — ABNORMAL LOW (ref 3.5–5.0)
Alkaline Phosphatase: 54 U/L (ref 38–126)
Anion gap: 6 (ref 5–15)
BUN: 14 mg/dL (ref 6–20)
CO2: 29 mmol/L (ref 22–32)
Calcium: 8.6 mg/dL — ABNORMAL LOW (ref 8.9–10.3)
Chloride: 102 mmol/L (ref 101–111)
Creatinine, Ser: 0.95 mg/dL (ref 0.61–1.24)
GFR calc Af Amer: >60 mL/min (ref >60)
GFR calc non Af Amer: >60 mL/min (ref >60)
Glucose, Bld: 116 mg/dL — ABNORMAL HIGH (ref 65–99)
Potassium: 3.3 mmol/L — ABNORMAL LOW (ref 3.5–5.1)
Sodium: 137 mmol/L (ref 135–145)
Total Bilirubin: 0.5 mg/dL (ref 0.3–1.2)
Total Protein: 6.6 g/dL (ref 6.5–8.1)

## 2016-05-03 LAB — URINE MICROSCOPIC-ADD ON: RBC / HPF: NONE SEEN RBC/hpf (ref 0–5)

## 2016-05-03 LAB — LIPASE, BLOOD: Lipase: 17 U/L (ref 11–51)

## 2016-05-03 MED ORDER — ONDANSETRON HCL 4 MG PO TABS: 4.0000 mg | ORAL_TABLET | Freq: Four times a day (QID) | ORAL | 0 refills | Status: DC

## 2016-05-03 MED ORDER — HYDROCODONE-ACETAMINOPHEN 5-325 MG PO TABS: 1.0000 | ORAL_TABLET | ORAL | 0 refills | Status: DC | PRN

## 2016-05-03 MED ORDER — PROMETHAZINE HCL 25 MG/ML IJ SOLN
12.5000 mg | Freq: Four times a day (QID) | INTRAMUSCULAR | Status: DC | PRN
  Administered 2016-05-03: 12.5 mg via INTRAVENOUS
  Filled 2016-05-03: qty 1

## 2016-05-03 MED ORDER — PREDNISONE 20 MG PO TABS: ORAL_TABLET | ORAL | 0 refills | Status: DC

## 2016-05-03 MED ORDER — PREDNISONE 20 MG PO TABS: 40.0000 mg | ORAL_TABLET | Freq: Every day | ORAL | 0 refills | Status: DC

## 2016-05-03 MED ORDER — POTASSIUM CHLORIDE 20 MEQ PO PACK
20.0000 meq | PACK | Freq: Once | ORAL | Status: AC
  Administered 2016-05-03: 20 meq via ORAL
  Filled 2016-05-03 (×2): qty 1

## 2016-05-03 MED ORDER — HYDROMORPHONE HCL 1 MG/ML IJ SOLN: 1.0000 mg | Freq: Once | INTRAMUSCULAR | Status: DC | PRN

## 2016-05-03 MED ORDER — KETOROLAC TROMETHAMINE 30 MG/ML IJ SOLN
15.0000 mg | Freq: Once | INTRAMUSCULAR | Status: AC
  Administered 2016-05-03: 15 mg via INTRAVENOUS
  Filled 2016-05-03: qty 1

## 2016-05-03 MED ORDER — METHYLPREDNISOLONE SODIUM SUCC 125 MG IJ SOLR
125.0000 mg | Freq: Once | INTRAMUSCULAR | Status: AC
  Administered 2016-05-03: 125 mg via INTRAVENOUS
  Filled 2016-05-03: qty 2

## 2016-05-03 MED ORDER — HYDROMORPHONE HCL 1 MG/ML IJ SOLN
1.0000 mg | Freq: Once | INTRAMUSCULAR | Status: AC
  Administered 2016-05-03: 1 mg via INTRAVENOUS
  Filled 2016-05-03: qty 1

## 2016-05-03 MED ORDER — SODIUM CHLORIDE 0.9 % IV BOLUS (SEPSIS)
1000.0000 mL | Freq: Once | INTRAVENOUS | Status: AC
  Administered 2016-05-03: 1000 mL via INTRAVENOUS

## 2016-05-03 MED ORDER — PANTOPRAZOLE SODIUM 40 MG PO TBEC
40.0000 mg | DELAYED_RELEASE_TABLET | Freq: Once | ORAL | Status: AC
  Administered 2016-05-03: 40 mg via ORAL
  Filled 2016-05-03: qty 1

## 2016-05-03 NOTE — ED Provider Notes (Signed)
[facility] DEPT Provider Note   CSN: [id number] Arrival date & time: 05/03/16  1814     History   Chief Complaint Chief Complaint  Patient presents with  . Crohn's Disease    HPI Joshua Robertson is a 40 y.o. male.  HPI   57yM with abdominal pain. Hx of Crohn's dz. He feels like he is having a [employer]. Worsening abdominal pain over the past couple days. Diffuse. Somewhat worse in epigastrium. Nausea and vomiting. No change in stool. No fever or chills. Is followed by GI. Reports compliance with meds. No urinary complaints.   Past Medical History:  Diagnosis Date  . BMI between 19-24,adult JAN 2010 149 LBS  . Crohn's disease ([place]) AUG 2009 NUR [place]   HB 9.4 FERRITIN 26  140 LBS; no serologies-pt can't afford test  . Helicobacter pylori gastritis 2009  . Hypertension   . Migraine headache    NONE SINCE AGE 68  . Sacroiliitis ([place]) 2009  . Spondyloarthropathy ([place]) 2009 EROSIVE    Patient Active Problem List   Diagnosis Date Noted  . Pain of left great toe 04/02/2015  . Perianal abscess 07/11/2013  . Hypertension, uncontrolled 10/22/2010  . WRIST PAIN, RIGHT 07/25/2009  . SMOKER 02/04/2009  . MIGRAINE HEADACHE 02/04/2009  . TINEA CORPORIS 11/22/2008  . Crohn's disease of both small and large intestine with intestinal obstruction ([place]) 08/15/2008    Past Surgical History:  Procedure Laterality Date  . COLONOSCOPY    . COLONOSCOPY N/A 07/30/2013   stenotic IC VALVE. UNABLE TO INTUBATE ILEUM.       Home Medications    Prior to Admission medications   Medication Sig Start Date End Date Taking? Authorizing Provider  benazepril (LOTENSIN) 40 MG tablet Take 1 tablet (40 mg total) by mouth daily. 06/02/11  Yes Dorie Rank, MD  cloNIDine (CATAPRES) 0.1 MG tablet Take 1 tablet (0.1 mg total) by mouth 2 (two) times daily. 08/09/12  Yes [name], MD  hydrochlorothiazide (MICROZIDE) 12.5 MG capsule Take 12.5 mg by mouth daily.   Yes Historical Provider, MD  Nutritional  Supplements (CARNATION INSTANT BREAKFAST PO) Take 1 Can by mouth 3 (three) times daily.   Yes Historical Provider, MD  ondansetron (ZOFRAN) 4 MG tablet Take 1 tablet (4 mg total) by mouth every 6 (six) hours. 03/13/16  Yes Orpah Greek, MD  promethazine (PHENERGAN) 12.5 MG tablet Take 1 tablet (12.5 mg total) by mouth every 6 (six) hours as needed for nausea or vomiting. 03/17/16  Yes [name], MD  vedolizumab (ENTYVIO) 300 MG injection Inject 300 mg into the vein every 8 (eight) weeks.    Yes Historical Provider, MD  Vitamin D, Ergocalciferol, (DRISDOL) 50000 units CAPS capsule Take 1 capsule (50,000 Units total) by mouth every 7 (seven) days. 04/11/16  Yes [name], MD  cholecalciferol (VITAMIN D) 1000 units tablet Take 1 tablet (1,000 Units total) by mouth daily. BEGIN AFTER WEEKLY VIT D DOSING IS COMPLETE 04/11/16   [name], MD  HYDROcodone-acetaminophen (NORCO/VICODIN) 5-325 MG tablet Take 1-2 tablets by mouth every 4 (four) hours as needed for moderate pain. Patient not taking: Reported on 05/03/2016 03/13/16   Orpah Greek, MD  predniSONE (DELTASONE) 20 MG tablet 3 tabs po daily x 3 days, then 2 tabs x 3 days, then 1.5 tabs x 3 days, then 1 tab x 3 days, then 0.5 tabs x 3 days Patient not taking: Reported on 05/03/2016 03/13/16   Orpah Greek, MD  Family History Family History  Problem Relation Age of Onset  . Hypertension Mother   . Hypertension Father   . Colon cancer Neg Hx   . Colon polyps Neg Hx   . Inflammatory bowel disease Neg Hx     Social History Social History  Substance Use Topics  . Smoking status: Former Smoker    Packs/day: 0.50    Types: Cigarettes  . Smokeless tobacco: Never Used     Comment: Smokes 2-3 cigarettes daily  . Alcohol use 0.0 oz/week     Comment: occas.     Allergies   Iodinated diagnostic agents; Azathioprine; Penicillins; and Pentasa [mesalamine er]   Review of Systems Review of Systems  All  systems reviewed and negative, other than as noted in HPI.   Physical Exam Updated Vital Signs BP 120/69 (BP Location: Left Arm)   Pulse 87   Temp 98.8 F (37.1 C) (Oral)   Resp 18   Ht 5' 9"  (1.753 m)   Wt 130 lb 8 oz (59.2 kg)   SpO2 100%   BMI 19.27 kg/m   Physical Exam  Constitutional: He appears well-developed and well-nourished. No distress.  HENT:  Head: Normocephalic and atraumatic.  Eyes: Conjunctivae are normal. Right eye exhibits no discharge. Left eye exhibits no discharge.  Neck: Neck supple.  Cardiovascular: Normal rate, regular rhythm and normal heart sounds.  Exam reveals no gallop and no friction rub.   No murmur heard. Pulmonary/Chest: Effort normal and breath sounds normal. No respiratory distress.  Abdominal: Soft. He exhibits no distension. There is tenderness.  Musculoskeletal: He exhibits no edema or tenderness.  Neurological: He is alert.  Skin: Skin is warm and dry.  Psychiatric: He has a normal mood and affect. His behavior is normal. Thought content normal.  Nursing note and vitals reviewed.    ED Treatments / Results  Labs (all labs ordered are listed, but only abnormal results are displayed) Labs Reviewed  CBC WITH DIFFERENTIAL/PLATELET - Abnormal; Notable for the following:       Result Value   WBC 11.2 (*)    RBC 3.65 (*)    Hemoglobin 8.6 (*)    HCT 26.6 (*)    MCV 72.9 (*)    MCH 23.6 (*)    Platelets 448 (*)    Eosinophils Absolute 0.8 (*)    All other components within normal limits  COMPREHENSIVE METABOLIC PANEL - Abnormal; Notable for the following:    Potassium 3.3 (*)    Glucose, Bld 116 (*)    Calcium 8.6 (*)    Albumin 3.1 (*)    AST 13 (*)    ALT 9 (*)    All other components within normal limits  LIPASE, BLOOD  URINALYSIS, ROUTINE W REFLEX MICROSCOPIC (NOT AT [facility])    EKG  EKG Interpretation None       Radiology No results found.  Procedures Procedures (including critical care time)  Medications  Ordered in ED Medications  promethazine (PHENERGAN) injection 12.5 mg (12.5 mg Intravenous Given 05/03/16 2125)  methylPREDNISolone sodium succinate (SOLU-MEDROL) 125 mg/2 mL injection 125 mg (125 mg Intravenous Given 05/03/16 2124)  HYDROmorphone (DILAUDID) injection 1 mg (1 mg Intravenous Given 05/03/16 2124)  sodium chloride 0.9 % bolus 1,000 mL (1,000 mLs Intravenous New Bag/Given 05/03/16 2125)  pantoprazole (PROTONIX) EC tablet 40 mg (40 mg Oral Given 05/03/16 2132)     Initial Impression / Assessment and Plan / ED Course  I have reviewed the triage vital signs and  the nursing notes.  Pertinent labs & imaging results that were available during my care of the patient were reviewed by me and considered in my medical decision making (see chart for details).  Clinical Course     40yM with abdominal pain. Hx of Crohn's. He feels like this is a flare. Tender on exam, but no peritonitis. Afebrile. HD stable. Nontoxic. Discussed imaging versus presumptive tx. He and I are both comfortable w/o scanning. Labs significant for worsening anemia. No overt bleeding. Not symptomatic in terms of dizzy, lightheaded, SOB, etc. Will need followed up as outpt. Will tx pain and give course of steroids. Has estabished GI care. It has been determined that no acute conditions requiring further emergency intervention are present at this time. The patient has been advised of the diagnosis and plan. I reviewed any labs and imaging including any potential incidental findings. We have discussed signs and symptoms that warrant return to the ED and they are listed in the discharge instructions.    Final Clinical Impressions(s) / ED Diagnoses   Final diagnoses:  Abdominal pain, unspecified abdominal location    New Prescriptions New Prescriptions   No medications on file     Virgel Manifold, MD 05/13/16 1437

## 2016-05-03 NOTE — ED Triage Notes (Signed)
Flare lasting over a week, the vomiting is better , but pain in worse.

## 2016-05-05 LAB — URINE CULTURE: CULTURE: NO GROWTH

## 2016-05-05 NOTE — Telephone Encounter (Signed)
Joshua Robertson from [facility] called- pt went there today to get his flu and pneumonia shots. They were able to give him the flu vaccine but d/t his age they could not give a pneumonia vaccine without an rx. I have written an rx per [facility] instructions on AVS and I have faxed it to the health dept.

## 2016-05-08 NOTE — Telephone Encounter (Signed)
REVIEWED. PT NEEDS PREVNAR FOLLOWED BY PNEUMOVAX IN 8 WEEKS.

## 2016-05-10 NOTE — Telephone Encounter (Signed)
Noted!  Order for the Pneumovax in 8 weeks was included on the order faxed.

## 2016-05-25 ENCOUNTER — Telehealth: Payer: Self-pay

## 2016-05-25 NOTE — Telephone Encounter (Signed)
precert center called today and asked about precert for pts entyvio. She said the pt recently got commercial insurance and it would need to be precerted at the hospital. I spoke with the drug rep and entyvio connects, the pts was approved for free medication until the end of the year. His next infusion will be on 06/09/16.  I will need to send them a new entyvio connects benefits investigation form for 2018. Drug rep told me to wait until 06/21/16 to send information for 2018 so it doesn't sit there and get lost between now and 2018. I tried to call the pt- NA and could not leave a voicemail. I will check with the insurance to see if the 06/09/16 infusion needs approval even though the medication is free.

## 2016-05-26 ENCOUNTER — Telehealth: Payer: Self-pay

## 2016-05-26 NOTE — Telephone Encounter (Signed)
OPV-PT NEED TO RETURN TO [facility] TO DISCUSS SURGERY.

## 2016-05-26 NOTE — Telephone Encounter (Signed)
Pt called and asked when he could see Dr. Oneida Alar. Michela Pitcher he is having a lot of abdominal pain, nausea, not eating and losing weight.  He wants to know when he can see Dr. Oneida Alar or does she want him to see an extender?   He cannot make it today, because he has to get a ride to come.  He does not have a working phone right now, and he will call me back about 1:30 pm to see what Dr. Oneida Alar says since I cannot call him.

## 2016-05-27 NOTE — Telephone Encounter (Signed)
PT said he will have to call me because his phone is not working. He was supposed to call back yesterday afternoon. If he calls this Am, per Dr. Oneida Alar he can see one of the extenders. Ok to make an appt since we can't reach him.

## 2016-05-28 ENCOUNTER — Encounter: Payer: Self-pay | Admitting: Gastroenterology

## 2016-05-28 ENCOUNTER — Ambulatory Visit (INDEPENDENT_AMBULATORY_CARE_PROVIDER_SITE_OTHER): Payer: BLUE CROSS/BLUE SHIELD | Admitting: Gastroenterology

## 2016-05-28 VITALS — BP 108/72 | HR 94 | Temp 97.8°F | Ht 69.5 in | Wt 123.0 lb

## 2016-05-28 DIAGNOSIS — K50812 Crohn's disease of both small and large intestine with intestinal obstruction: Secondary | ICD-10-CM | POA: Diagnosis not present

## 2016-05-28 DIAGNOSIS — D649 Anemia, unspecified: Secondary | ICD-10-CM | POA: Diagnosis not present

## 2016-05-28 DIAGNOSIS — R634 Abnormal weight loss: Secondary | ICD-10-CM

## 2016-05-28 DIAGNOSIS — R1084 Generalized abdominal pain: Secondary | ICD-10-CM | POA: Diagnosis not present

## 2016-05-28 LAB — COMPREHENSIVE METABOLIC PANEL
ALK PHOS: 77 U/L (ref 40–115)
ALT: 11 U/L (ref 9–46)
AST: 16 U/L (ref 10–40)
Albumin: 4.2 g/dL (ref 3.6–5.1)
BILIRUBIN TOTAL: 0.5 mg/dL (ref 0.2–1.2)
BUN: 17 mg/dL (ref 7–25)
CO2: 31 mmol/L (ref 20–31)
CREATININE: 0.94 mg/dL (ref 0.60–1.35)
Calcium: 10 mg/dL (ref 8.6–10.3)
Chloride: 99 mmol/L (ref 98–110)
GLUCOSE: 86 mg/dL (ref 65–99)
Potassium: 4 mmol/L (ref 3.5–5.3)
SODIUM: 140 mmol/L (ref 135–146)
Total Protein: 8 g/dL (ref 6.1–8.1)

## 2016-05-28 LAB — CBC WITH DIFFERENTIAL/PLATELET
BASOS PCT: 0 %
Basophils Absolute: 0 cells/uL (ref 0–200)
EOS PCT: 4 %
Eosinophils Absolute: 468 cells/uL (ref 15–500)
HCT: 32.8 % — ABNORMAL LOW (ref 38.5–50.0)
Hemoglobin: 10.1 g/dL — ABNORMAL LOW (ref 13.2–17.1)
LYMPHS ABS: 2808 {cells}/uL (ref 850–3900)
LYMPHS PCT: 24 %
MCH: 22.4 pg — ABNORMAL LOW (ref 27.0–33.0)
MCHC: 30.8 g/dL — AB (ref 32.0–36.0)
MCV: 72.7 fL — ABNORMAL LOW (ref 80.0–100.0)
MONO ABS: 819 {cells}/uL (ref 200–950)
MPV: 9 fL (ref 7.5–12.5)
Monocytes Relative: 7 %
NEUTROS PCT: 65 %
Neutro Abs: 7605 cells/uL (ref 1500–7800)
Platelets: 692 10*3/uL — ABNORMAL HIGH (ref 140–400)
RBC: 4.51 MIL/uL (ref 4.20–5.80)
RDW: 17.1 % — AB (ref 11.0–15.0)
WBC: 11.7 10*3/uL — AB (ref 3.8–10.8)

## 2016-05-28 LAB — IRON AND TIBC
%SAT: 2 % — ABNORMAL LOW (ref 15–60)
Iron: <10 ug/dL — ABNORMAL LOW (ref 50–180)
TIBC: 250 ug/dL (ref 250–425)
UIBC: 245 ug/dL (ref 125–400)

## 2016-05-28 LAB — LIPASE: Lipase: <5 U/L — ABNORMAL LOW (ref 7–60)

## 2016-05-28 LAB — FERRITIN: FERRITIN: 216 ng/mL (ref 20–380)

## 2016-05-28 MED ORDER — HYDROCODONE-ACETAMINOPHEN 5-325 MG PO TABS: 1.0000 | ORAL_TABLET | Freq: Four times a day (QID) | ORAL | 0 refills | Status: DC | PRN

## 2016-05-28 NOTE — Patient Instructions (Signed)
1. Please have your labs done. 2. Continue to drink plenty of fluids to keep urine light yellow.  3. Vidodin for moderate to severe pain only. It can cause constipation. 4. I will discuss further management options with Dr. Oneida Alar once your labs are back on Monday.

## 2016-05-28 NOTE — Progress Notes (Addendum)
REVIEWED-1445 DEC 19-PT IN ED, NEEDS REPEAT CT WITH ORAL/IV CONTRAST. PT HAS NAUSEA/VOMITNG AFTER IV CONTRAST. I DISCUSSED WITH DRs. POFF AND CLARK. NAUSEA/VOMITING AFTER CONTRAST IS NOT AN ALLERGY. IT IS A CHEMOTACTIC REACTION. PT SHOULD BE ABLE TO HAVE IV CONTRAST AS LONG AS HE HAS NEVER HAD HIVES AFTER IV DYE. MR ENTEROGRAPHY NOT HELPFUL IN THIS SETTING. DISCUSSED WITH DR. Lorenso Courier.   Primary Care Physician: Raiford Simmonds., PA-C  Primary Gastroenterologist:  Barney Drain, MD   Chief Complaint  Patient presents with  . Abdominal Pain    HPI: Joshua Robertson is a 40 y.o. male here for urgent follow-up. Last seen on 03/17/2016 by Dr. Oneida Alar. Started Entyvio around August, completed induction phase and now receiving infusion every 8 weeks. CT 03/12/2016 with suggestion of wall thickening with stranding involving the cecum and transverse colon but decompression of small and large bowel limits evaluation. MR enterography September 2016 at [facility] showed circumferential thickening and transmural enhancement involving the terminal ileum, 5.9 cm segment, without evidence of bowel obstruction, fistula, abscess. Colonoscopy October 2016 at [facility] by Dr. Eduard Roux revealed a stricture at the ileocecal valve which was not traversable. Biopsies showed chronic colitis with minimal activity. Patient was seen by general surgery at [facility] in August 2016 and they do not recommend surgery at that time it was felt he was clinically stable.  Patient presents today stating that he does not feel like the [place] is helping. He states he has severe abdominal pain at least 3-4 days per week associated with vomiting. Very hard to eat. He is down 20 pounds in the last 3 months. He notes that he is not having diarrhea. His usually has 1-2 BMs weekly, [place] 4. No melena rectal bleeding. Last 2 months has been his "worse flare of Crohn's" since his diagnosis. Denies NSAID or aspirin use. He quit smoking earlier this  year. No heartburn. Takes Zofran for vomiting but doesn't really help. No hematemesis.    He also has noted decline in his hemoglobin, had been in the 11-12 range consistently but now down to 8.6 last month. MCV declining as well, currently 72.9. He's had mild leukocytosis. Albumin 3.1.  Current Outpatient Prescriptions  Medication Sig Dispense Refill  . benazepril (LOTENSIN) 40 MG tablet Take 1 tablet (40 mg total) by mouth daily. 30 tablet 2  . cholecalciferol (VITAMIN D) 1000 units tablet Take 1 tablet (1,000 Units total) by mouth daily. BEGIN AFTER WEEKLY VIT D DOSING IS COMPLETE 30 tablet 11  . cloNIDine (CATAPRES) 0.1 MG tablet Take 1 tablet (0.1 mg total) by mouth 2 (two) times daily. 60 tablet 11  . hydrochlorothiazide (MICROZIDE) 12.5 MG capsule Take 12.5 mg by mouth daily.    . Nutritional Supplements (CARNATION INSTANT BREAKFAST PO) Take 1 Can by mouth 3 (three) times daily.    . vedolizumab (ENTYVIO) 300 MG injection Inject 300 mg into the vein every 8 (eight) weeks.      No current facility-administered medications for this visit.     Allergies as of 05/28/2016 - Review Complete 05/28/2016  Allergen Reaction Noted  . Iodinated diagnostic agents Nausea And Vomiting 08/20/2013  . Azathioprine Nausea And Vomiting 11/25/2009  . Penicillins Swelling and Rash 08/15/2008  . Pentasa [mesalamine er] Rash 07/11/2013   Past Medical History:  Diagnosis Date  . BMI between 19-24,adult JAN 2010 149 LBS  . Crohn's disease ([place]) AUG 2009 NUR [facility]   HB 9.4 FERRITIN 26  140 LBS; no serologies-pt can't afford test  .  Helicobacter pylori gastritis 2009  . Hypertension   . Migraine headache    NONE SINCE AGE 35  . Sacroiliitis ([place]) 2009  . Spondyloarthropathy ([place]) 2009 EROSIVE   Past Surgical History:  Procedure Laterality Date  . COLONOSCOPY    . COLONOSCOPY N/A 07/30/2013   stenotic IC VALVE. UNABLE TO INTUBATE ILEUM.   Family History  Problem Relation Age of Onset  .  Hypertension Mother   . Hypertension Father   . Colon cancer Neg Hx   . Colon polyps Neg Hx   . Inflammatory bowel disease Neg Hx    Social History  Substance Use Topics  . Smoking status: Former Smoker    Packs/day: 0.50    Types: Cigarettes  . Smokeless tobacco: Never Used     Comment: Smokes 2-3 cigarettes daily  . Alcohol use 0.0 oz/week     Comment: occas.    ROS:  General: Negative for fever, chills,weakness.See history of present illness. Complains of feeling tired all the time. ENT: Negative for hoarseness, difficulty swallowing , nasal congestion. CV: Negative for chest pain, angina, palpitations, dyspnea on exertion, peripheral edema.  Respiratory: Negative for dyspnea at rest, dyspnea on exertion, cough, sputum, wheezing.  GI: See history of present illness. GU:  Negative for dysuria, hematuria, urinary incontinence, urinary frequency, nocturnal urination.  Endo: See history of present illness    Physical Examination:   BP 108/72   Pulse 94   Temp 97.8 F (36.6 C) (Oral)   Ht 5' 9.5" (1.765 m)   Wt 123 lb (55.8 kg)   BMI 17.90 kg/m   General: Thin, well-developed in no acute distress.  Eyes: No icterus. Mouth: Oropharyngeal mucosa moist and pink , no lesions erythema or exudate. Lungs: Clear to auscultation bilaterally.  Heart: Regular rate and rhythm, no murmurs rubs or gallops.  Abdomen: Bowel sounds are normal, mild diffuse tenderness, nondistended, no hepatosplenomegaly or masses, no abdominal bruits or hernia , no rebound or guarding.   Extremities: No lower extremity edema. No clubbing or deformities. Neuro: Alert and oriented x 4   Skin: Warm and dry, no jaundice.   Psych: Alert and cooperative, normal mood and affect.  Labs:  Lab Results  Component Value Date   WBC 11.2 (H) 05/03/2016   HGB 8.6 (L) 05/03/2016   HCT 26.6 (L) 05/03/2016   MCV 72.9 (L) 05/03/2016   PLT 448 (H) 05/03/2016   Lab Results  Component Value Date   CREATININE 0.95  05/03/2016   BUN 14 05/03/2016   NA 137 05/03/2016   K 3.3 (L) 05/03/2016   CL 102 05/03/2016   CO2 29 05/03/2016   Lab Results  Component Value Date   ALT 9 (L) 05/03/2016   AST 13 (L) 05/03/2016   ALKPHOS 54 05/03/2016   BILITOT 0.5 05/03/2016   Lab Results  Component Value Date   VITAMINB12 393 03/22/2016   Lab Results  Component Value Date   LIPASE 17 05/03/2016    Imaging Studies: No results found.

## 2016-05-28 NOTE — Telephone Encounter (Signed)
Spoke with the pt about this today and he understands what the process is going to be.

## 2016-05-28 NOTE — Assessment & Plan Note (Signed)
40 year old gentleman with history of ileocolonic Crohn's disease, with history of ileal/IVC stricture. Has been on Entyvio since August with no significant improvement of symptoms. Patient has lost 20 pounds in the past 3 months. He has recurrent vomiting, abdominal pain. No diarrhea. Suspect symptoms are related to stricture and likely will not get better without surgical intervention. He also has new/worsening microcytic anemia which we will follow up with labs today. I will discuss further management with Dr. Oneida Alar on Monday, suspect he needs to see surgery again at [facility]. In the interim have provided him with short course of Vicodin for supportive measures.

## 2016-05-31 NOTE — Progress Notes (Signed)
CC'ED TO PCP 

## 2016-05-31 NOTE — Telephone Encounter (Signed)
Letter mailed to pt to call and schedule an appt with extender.

## 2016-06-01 ENCOUNTER — Emergency Department (HOSPITAL_COMMUNITY): Payer: BLUE CROSS/BLUE SHIELD

## 2016-06-01 ENCOUNTER — Encounter (HOSPITAL_COMMUNITY): Payer: Self-pay | Admitting: Cardiology

## 2016-06-01 ENCOUNTER — Inpatient Hospital Stay (HOSPITAL_COMMUNITY)
Admission: EM | Admit: 2016-06-01 | Discharge: 2016-06-03 | DRG: 386 | Disposition: A | Discharge status: Home / Self Care | Payer: BLUE CROSS/BLUE SHIELD | Attending: Internal Medicine | Admitting: Internal Medicine

## 2016-06-01 DIAGNOSIS — F1220 Cannabis dependence, uncomplicated: Secondary | ICD-10-CM | POA: Diagnosis present

## 2016-06-01 DIAGNOSIS — K50918 Crohn's disease, unspecified, with other complication: Secondary | ICD-10-CM | POA: Diagnosis not present

## 2016-06-01 DIAGNOSIS — Z888 Allergy status to other drugs, medicaments and biological substances status: Secondary | ICD-10-CM

## 2016-06-01 DIAGNOSIS — K5010 Crohn's disease of large intestine without complications: Secondary | ICD-10-CM | POA: Diagnosis not present

## 2016-06-01 DIAGNOSIS — E440 Moderate protein-calorie malnutrition: Secondary | ICD-10-CM | POA: Insufficient documentation

## 2016-06-01 DIAGNOSIS — I10 Essential (primary) hypertension: Secondary | ICD-10-CM | POA: Diagnosis present

## 2016-06-01 DIAGNOSIS — K529 Noninfective gastroenteritis and colitis, unspecified: Secondary | ICD-10-CM | POA: Insufficient documentation

## 2016-06-01 DIAGNOSIS — Z87891 Personal history of nicotine dependence: Secondary | ICD-10-CM

## 2016-06-01 DIAGNOSIS — F1210 Cannabis abuse, uncomplicated: Secondary | ICD-10-CM | POA: Diagnosis not present

## 2016-06-01 DIAGNOSIS — K56699 Other intestinal obstruction unspecified as to partial versus complete obstruction: Secondary | ICD-10-CM | POA: Diagnosis present

## 2016-06-01 DIAGNOSIS — K5080 Crohn's disease of both small and large intestine without complications: Secondary | ICD-10-CM | POA: Diagnosis not present

## 2016-06-01 DIAGNOSIS — Z79899 Other long term (current) drug therapy: Secondary | ICD-10-CM

## 2016-06-01 DIAGNOSIS — Z8249 Family history of ischemic heart disease and other diseases of the circulatory system: Secondary | ICD-10-CM

## 2016-06-01 DIAGNOSIS — Z681 Body mass index (BMI) 19 or less, adult: Secondary | ICD-10-CM

## 2016-06-01 DIAGNOSIS — Z880 Allergy status to penicillin: Secondary | ICD-10-CM

## 2016-06-01 DIAGNOSIS — Z91041 Radiographic dye allergy status: Secondary | ICD-10-CM

## 2016-06-01 LAB — CBC WITH DIFFERENTIAL/PLATELET
Basophils Absolute: 0.1 10*3/uL (ref 0.0–0.1)
Basophils Relative: 0 %
EOS PCT: 1 %
Eosinophils Absolute: 0.3 10*3/uL (ref 0.0–0.7)
HCT: 29.9 % — ABNORMAL LOW (ref 39.0–52.0)
Hemoglobin: 9.6 g/dL — ABNORMAL LOW (ref 13.0–17.0)
LYMPHS ABS: 2.5 10*3/uL (ref 0.7–4.0)
LYMPHS PCT: 13 %
MCH: 22.9 pg — AB (ref 26.0–34.0)
MCHC: 32.1 g/dL (ref 30.0–36.0)
MCV: 71.4 fL — AB (ref 78.0–100.0)
MONO ABS: 1.4 10*3/uL — AB (ref 0.1–1.0)
Monocytes Relative: 7 %
Neutro Abs: 14.6 10*3/uL — ABNORMAL HIGH (ref 1.7–7.7)
Neutrophils Relative %: 79 %
PLATELETS: 569 10*3/uL — AB (ref 150–400)
RBC: 4.19 MIL/uL — AB (ref 4.22–5.81)
RDW: 16.1 % — ABNORMAL HIGH (ref 11.5–15.5)
WBC: 18.8 10*3/uL — ABNORMAL HIGH (ref 4.0–10.5)

## 2016-06-01 LAB — COMPREHENSIVE METABOLIC PANEL
ALBUMIN: 3.6 g/dL (ref 3.5–5.0)
ALK PHOS: 71 U/L (ref 38–126)
ALT: 10 U/L — ABNORMAL LOW (ref 17–63)
ANION GAP: 9 (ref 5–15)
AST: 15 U/L (ref 15–41)
BUN: 13 mg/dL (ref 6–20)
CO2: 29 mmol/L (ref 22–32)
Calcium: 9.3 mg/dL (ref 8.9–10.3)
Chloride: 98 mmol/L — ABNORMAL LOW (ref 101–111)
Creatinine, Ser: 0.81 mg/dL (ref 0.61–1.24)
GFR calc Af Amer: >60 mL/min (ref >60)
GFR calc non Af Amer: >60 mL/min (ref >60)
GLUCOSE: 97 mg/dL (ref 65–99)
POTASSIUM: 3.5 mmol/L (ref 3.5–5.1)
Sodium: 136 mmol/L (ref 135–145)
Total Bilirubin: 0.4 mg/dL (ref 0.3–1.2)
Total Protein: 7.8 g/dL (ref 6.5–8.1)

## 2016-06-01 LAB — RAPID URINE DRUG SCREEN, HOSP PERFORMED
AMPHETAMINES: NOT DETECTED
BENZODIAZEPINES: NOT DETECTED
Barbiturates: NOT DETECTED
Cocaine: NOT DETECTED
OPIATES: POSITIVE — AB
TETRAHYDROCANNABINOL: POSITIVE — AB

## 2016-06-01 LAB — LIPASE, BLOOD: Lipase: 17 U/L (ref 11–51)

## 2016-06-01 MED ORDER — SODIUM CHLORIDE 0.9 % IV BOLUS (SEPSIS)
1000.0000 mL | Freq: Once | INTRAVENOUS | Status: AC
  Administered 2016-06-01: 1000 mL via INTRAVENOUS

## 2016-06-01 MED ORDER — ACETAMINOPHEN 325 MG PO TABS: 650.0000 mg | ORAL_TABLET | Freq: Four times a day (QID) | ORAL | Status: DC | PRN

## 2016-06-01 MED ORDER — ONDANSETRON HCL 4 MG/2ML IJ SOLN
4.0000 mg | Freq: Once | INTRAMUSCULAR | Status: AC
  Administered 2016-06-01: 4 mg via INTRAVENOUS
  Filled 2016-06-01: qty 2

## 2016-06-01 MED ORDER — MORPHINE SULFATE (PF) 2 MG/ML IV SOLN
2.0000 mg | INTRAVENOUS | Status: DC | PRN
  Administered 2016-06-01 – 2016-06-02 (×3): 2 mg via INTRAVENOUS
  Filled 2016-06-01 (×3): qty 1

## 2016-06-01 MED ORDER — CIPROFLOXACIN IN D5W 400 MG/200ML IV SOLN
400.0000 mg | Freq: Two times a day (BID) | INTRAVENOUS | Status: DC
  Administered 2016-06-01 – 2016-06-03 (×4): 400 mg via INTRAVENOUS
  Filled 2016-06-01 (×4): qty 200

## 2016-06-01 MED ORDER — MORPHINE SULFATE (PF) 2 MG/ML IV SOLN
2.0000 mg | Freq: Once | INTRAVENOUS | Status: AC
  Administered 2016-06-01: 2 mg via INTRAVENOUS
  Filled 2016-06-01: qty 1

## 2016-06-01 MED ORDER — LACTATED RINGERS IV SOLN
INTRAVENOUS | Status: DC
  Administered 2016-06-01 – 2016-06-03 (×2): via INTRAVENOUS

## 2016-06-01 MED ORDER — HYDROCHLOROTHIAZIDE 12.5 MG PO CAPS
12.5000 mg | ORAL_CAPSULE | Freq: Every day | ORAL | Status: DC
  Administered 2016-06-02: 12.5 mg via ORAL
  Filled 2016-06-01: qty 1

## 2016-06-01 MED ORDER — IOPAMIDOL (ISOVUE-300) INJECTION 61%
100.0000 mL | Freq: Once | INTRAVENOUS | Status: AC | PRN
  Administered 2016-06-01: 100 mL via INTRAVENOUS

## 2016-06-01 MED ORDER — BENAZEPRIL HCL 10 MG PO TABS
40.0000 mg | ORAL_TABLET | Freq: Every day | ORAL | Status: DC
  Administered 2016-06-02: 40 mg via ORAL
  Filled 2016-06-01: qty 4

## 2016-06-01 MED ORDER — METHYLPREDNISOLONE SODIUM SUCC 125 MG IJ SOLR
80.0000 mg | Freq: Two times a day (BID) | INTRAMUSCULAR | Status: DC
  Administered 2016-06-01 – 2016-06-03 (×4): 80 mg via INTRAVENOUS
  Filled 2016-06-01 (×4): qty 2

## 2016-06-01 MED ORDER — HYDROMORPHONE HCL 1 MG/ML IJ SOLN
1.0000 mg | Freq: Once | INTRAMUSCULAR | Status: AC
  Administered 2016-06-01: 1 mg via INTRAVENOUS
  Filled 2016-06-01: qty 1

## 2016-06-01 MED ORDER — METRONIDAZOLE IN NACL 5-0.79 MG/ML-% IV SOLN
500.0000 mg | Freq: Three times a day (TID) | INTRAVENOUS | Status: DC
  Administered 2016-06-01 – 2016-06-03 (×5): 500 mg via INTRAVENOUS
  Filled 2016-06-01 (×4): qty 100

## 2016-06-01 MED ORDER — ONDANSETRON HCL 4 MG/2ML IJ SOLN: 4.0000 mg | Freq: Four times a day (QID) | INTRAMUSCULAR | Status: DC | PRN

## 2016-06-01 MED ORDER — ACETAMINOPHEN 650 MG RE SUPP: 650.0000 mg | Freq: Four times a day (QID) | RECTAL | Status: DC | PRN

## 2016-06-01 MED ORDER — ONDANSETRON HCL 4 MG PO TABS: 4.0000 mg | ORAL_TABLET | Freq: Four times a day (QID) | ORAL | Status: DC | PRN

## 2016-06-01 MED ORDER — ENOXAPARIN SODIUM 40 MG/0.4ML ~~LOC~~ SOLN
40.0000 mg | SUBCUTANEOUS | Status: DC
  Administered 2016-06-01: 40 mg via SUBCUTANEOUS
  Filled 2016-06-01 (×2): qty 0.4

## 2016-06-01 MED ORDER — CLONIDINE HCL 0.1 MG PO TABS
0.1000 mg | ORAL_TABLET | Freq: Two times a day (BID) | ORAL | Status: DC
  Administered 2016-06-01 – 2016-06-03 (×4): 0.1 mg via ORAL
  Filled 2016-06-01 (×4): qty 1

## 2016-06-01 MED ORDER — BOOST / RESOURCE BREEZE PO LIQD
1.0000 | Freq: Three times a day (TID) | ORAL | Status: DC
  Administered 2016-06-02: 1 via ORAL

## 2016-06-01 NOTE — Telephone Encounter (Signed)
Pt's girlfriend called and said pt has not slept all night with his stomach pain. She said she is about to take him to the ED. I told her that the pt was supposed to call me back last week for an appt, because he said I could not reach him, and I never heard from him. I told her that Dr. Oneida Alar has said he is going to have to go to [facility] as she had recommended. She said she does not know if she can even get a ride to take him to the hospital, but if she can't she is going to call EMS. I told her that I will let Dr. Oneida Alar know.

## 2016-06-01 NOTE — ED Triage Notes (Signed)
Abdominal pain times 2 weeks.  States he has chron's and seen his doctor Friday.

## 2016-06-01 NOTE — ED Provider Notes (Signed)
[facility] DEPT Provider Note   CSN: [id number] Arrival date & time: 06/01/16  1005     History   Chief Complaint Chief Complaint  Patient presents with  . Abdominal Pain    HPI Joshua Robertson is a 40 y.o. male.  HPI  This is a 40 year old male with a history of ileocolonic Crohn's disease with a h/o ileal/IC stricture presenting with abdominal pain since August 2017 that acutely worsened and has become unrelenting over the last few days.  The patient notes his symptoms started in August when his medications were switched. He notes initially his symptoms were intermittent but he has had diffuse abdominal pain that is constant since last night.  He last ate a small amount yesterday morning. He's been drinking a small amount of water since last night to try to stay hydrated. Eating makes the pain worse, drinking does not seem to bother his abdominal pain. No nausea or vomiting since Friday. No diarrhea. Last had a BM this AM, soft/formed stool. No hematochezia/melana. He notes subjective fevers/chills.  He notes he's lost 20lbs over the last few months.  Of note, the patient was seen by a  PA at his gastroenterologist on 12/15. At that time, there was concern that his symptoms were secondary to a stricture. The PDA was going to touch his with Dr. Oneida Alar about seeing surgery at [facility] once again. He was discharged home with Vicodin for supportive measures.  The patient states he hasn't been able to fill this Rx.   Past Medical History:  Diagnosis Date  . BMI between 19-24,adult JAN 2010 149 LBS  . Crohn's disease ([place]) AUG 2009 NUR [place]   HB 9.4 FERRITIN 26  140 LBS; no serologies-pt can't afford test  . Helicobacter pylori gastritis 2009  . Hypertension   . Migraine headache    NONE SINCE AGE 57  . Sacroiliitis ([place]) 2009  . Spondyloarthropathy ([place]) 2009 EROSIVE    Patient Active Problem List   Diagnosis Date Noted  . Anemia 05/28/2016  . Pain of left great toe 04/02/2015    . Perianal abscess 07/11/2013  . Hypertension, uncontrolled 10/22/2010  . WRIST PAIN, RIGHT 07/25/2009  . SMOKER 02/04/2009  . MIGRAINE HEADACHE 02/04/2009  . WEIGHT LOSS 02/04/2009  . TINEA CORPORIS 11/22/2008  . Crohn's disease of both small and large intestine with intestinal obstruction ([place]) 08/15/2008    Past Surgical History:  Procedure Laterality Date  . COLONOSCOPY    . COLONOSCOPY N/A 07/30/2013   stenotic IC VALVE. UNABLE TO INTUBATE ILEUM.       Home Medications    Prior to Admission medications   Medication Sig Start Date End Date Taking? Authorizing Provider  benazepril (LOTENSIN) 40 MG tablet Take 1 tablet (40 mg total) by mouth daily. 06/02/11  Yes Dorie Rank, MD  cholecalciferol (VITAMIN D) 1000 units tablet Take 1 tablet (1,000 Units total) by mouth daily. BEGIN AFTER WEEKLY VIT D DOSING IS COMPLETE 04/11/16  Yes [name], MD  cloNIDine (CATAPRES) 0.1 MG tablet Take 1 tablet (0.1 mg total) by mouth 2 (two) times daily. 08/09/12  Yes [name], MD  hydrochlorothiazide (MICROZIDE) 12.5 MG capsule Take 12.5 mg by mouth daily.   Yes Historical Provider, MD  vedolizumab (ENTYVIO) 300 MG injection Inject 300 mg into the vein every 8 (eight) weeks.    Yes Historical Provider, MD  Nutritional Supplements (CARNATION INSTANT BREAKFAST PO) Take 1 Can by mouth 3 (three) times daily.    Historical Provider,  MD    Family History Family History  Problem Relation Age of Onset  . Hypertension Mother   . Hypertension Father   . Colon cancer Neg Hx   . Colon polyps Neg Hx   . Inflammatory bowel disease Neg Hx     Social History Social History  Substance Use Topics  . Smoking status: Former Smoker    Packs/day: 0.50    Types: Cigarettes  . Smokeless tobacco: Never Used     Comment: Smokes 2-3 cigarettes daily  . Alcohol use 0.0 oz/week     Comment: occas.     Allergies   Iodinated diagnostic agents; Azathioprine; Penicillins; and Pentasa [mesalamine  er]   Review of Systems Review of Systems  Constitutional: Positive for appetite change, chills, fever and unexpected weight change. Negative for diaphoresis.  Gastrointestinal: Positive for abdominal pain, nausea and vomiting. Negative for abdominal distention, anal bleeding, blood in stool, constipation, diarrhea and rectal pain.  Genitourinary: Positive for decreased urine volume. Negative for difficulty urinating and dysuria.  Musculoskeletal: Negative for arthralgias, joint swelling and myalgias.  Skin: Negative for rash.  All other systems reviewed and are negative.    Physical Exam Updated Vital Signs BP 125/93   Pulse 72   Temp 98.8 F (37.1 C) (Oral)   Resp 18   Ht 5' 9.5" (1.765 m)   Wt 55.8 kg   SpO2 100%   BMI 17.90 kg/m   Physical Exam  Constitutional: He is oriented to person, place, and time. He appears well-developed and well-nourished. No distress.  Appears mildly uncomfortable  HENT:  Head: Normocephalic and atraumatic.  Nose: Nose normal.  Mouth/Throat: Oropharynx is clear and moist. No oropharyngeal exudate.  Eyes: Conjunctivae are normal. Right eye exhibits no discharge. Left eye exhibits no discharge. No scleral icterus.  Neck: Normal range of motion. Neck supple.  Cardiovascular: Normal rate, regular rhythm, normal heart sounds and intact distal pulses.  Exam reveals no friction rub.   No murmur heard. Pulmonary/Chest: Effort normal and breath sounds normal. No respiratory distress. He has no wheezes. He has no rales. He exhibits no tenderness.  Abdominal: Soft. Bowel sounds are normal. He exhibits no distension. There is tenderness. There is guarding.  Diffuse tenderness. Voluntary guarding prior to abdominal exam beginning. No rebound.   Musculoskeletal: He exhibits no edema.  Neurological: He is alert and oriented to person, place, and time. He exhibits normal muscle tone.  Skin: Skin is warm. Capillary refill takes less than 2 seconds. No rash  noted. He is not diaphoretic.  Psychiatric: He has a normal mood and affect. His behavior is normal.     ED Treatments / Results  Labs (all labs ordered are listed, but only abnormal results are displayed) Labs Reviewed  CBC WITH DIFFERENTIAL/PLATELET - Abnormal; Notable for the following:       Result Value   WBC 18.8 (*)    RBC 4.19 (*)    Hemoglobin 9.6 (*)    HCT 29.9 (*)    MCV 71.4 (*)    MCH 22.9 (*)    RDW 16.1 (*)    Platelets 569 (*)    Neutro Abs 14.6 (*)    Monocytes Absolute 1.4 (*)    All other components within normal limits  COMPREHENSIVE METABOLIC PANEL - Abnormal; Notable for the following:    Chloride 98 (*)    ALT 10 (*)    All other components within normal limits  LIPASE, BLOOD    EKG  EKG  Interpretation None       Radiology Ct Abdomen Pelvis W Contrast  Result Date: 06/01/2016 CLINICAL DATA:  Generalized abdominal pain over the last 2 weeks. Personal history of Crohn's disease. EXAM: CT ABDOMEN AND PELVIS WITH CONTRAST TECHNIQUE: Multidetector CT imaging of the abdomen and pelvis was performed using the standard protocol following bolus administration of intravenous contrast. CONTRAST:  131m ISOVUE-300 IOPAMIDOL (ISOVUE-300) INJECTION 61% COMPARISON:  03/13/2016 FINDINGS: The study suffers from severe motion degradation. Lower chest: Negative Hepatobiliary: There are multiple well-circumscribed subcentimeter low densities in the liver likely to represent small liver cysts. These are slightly progressive over time but not worrisome. No calcified gallstones. Pancreas: Normal Spleen: Normal Adrenals/Urinary Tract: Adrenal glands are normal. Kidneys are normal. Stomach/Bowel: No bowel pathology is seen, though there is extensive motion degradation. No dilated loops to suggest obstruction. Contrast has not yet reached the colon. One could question wall thickening in the sigmoid colon that could go along with colitis. Vascular/Lymphatic: Aortic  atherosclerosis. No aneurysm. IVC is normal. Reproductive: Normal Other: Small to moderate amount of free fluid in the pelvis. Musculoskeletal: Chronic lower lumbar degenerative changes. IMPRESSION: No evidence of obstruction on this motion degraded study. Question of wall thickening in the sigmoid colon that could go along with colitis. Moderate amount of free fluid in the pelvis. Electronically Signed   By: MNelson ChimesM.D.   On: 06/01/2016 15:33    Procedures Procedures (including critical care time)  Medications Ordered in ED Medications  sodium chloride 0.9 % bolus 1,000 mL (0 mLs Intravenous Stopped 06/01/16 1402)  morphine 2 MG/ML injection 2 mg (2 mg Intravenous Given 06/01/16 1104)  ondansetron (ZOFRAN) injection 4 mg (4 mg Intravenous Given 06/01/16 1213)  HYDROmorphone (DILAUDID) injection 1 mg (1 mg Intravenous Given 06/01/16 1246)  iopamidol (ISOVUE-300) 61 % injection 100 mL (100 mLs Intravenous Contrast Given 06/01/16 1501)  HYDROmorphone (DILAUDID) injection 1 mg (1 mg Intravenous Given 06/01/16 1548)     Initial Impression / Assessment and Plan / ED Course  I have reviewed the triage vital signs and the nursing notes.  Pertinent labs & imaging results that were available during my care of the patient were reviewed by me and considered in my medical decision making (see chart for details).  Clinical Course    1045: will start IV, give 1L fluid bolus, morphine 257m and obtain CMET, CBC, and lipase.  1145: Leukocytosis worsening from Friday (11.7 >18.8). Discussed case with Dr. FiOneida Alarpatient's GI provider. She recommended CT abdomen/pelvis with IV and oral contrast. She stressed ensuring imaging was done 2 hours after contrast load to ensure good imaging. No need for steroids or antibiotics currently. No admission currently, as if the patient has an obstruction [facility]   1230: patient endorses continued abdominal pain, however improved.  Went from 12/10 to 8/10. Will give another dose of IV pain medication. Currently drinking oral contrast for CT. He was pre-treated with Zofran given h/o N/V. He denies a h/o rash, hives, pruritus, throat swelling with any contrast..  1445: patient's pain improved, but not resolved. He is next in line for CT. IF CT reveals obstruction, [facility], if it does not show obstruction, [facility]   1530: CT performed, motion degraded. Official read pending per radiologist. Discussing with patient, he had some nausea with the CT but no vomiting. He still rates  his pain as a 7/10. Will await final read to determine transfer to [facility] vs inpatient here at [facility].  1550: discussed CT results with Dr. Oneida Alar. Even waiting 2 hrs after contrast, it still did not make it to the colon. Motion degraded, however concerning for possible colitis. She suspects viral etiology. Given his pain and PO intake cannot be managed at home, will have him observed overnight. Spoke with Dr. Lorin Mercy with [facility] who will resume care.   Final Clinical Impressions(s) / ED Diagnoses   Final diagnoses:  Colitis    New Prescriptions New Prescriptions   No medications on file     Archie Patten, MD 06/01/16 1559    Elnora Morrison, MD 06/05/16 [phone]

## 2016-06-01 NOTE — Telephone Encounter (Addendum)
REVIEWED. I PERSONALLY REVIEWED THE CT SEP 2017 WITH DR. CLARK-TI IS THICKENED, NO CONTRAST IN COLON OR TO. IF PT IS HAVING SEVERE ABDOMINAL PAIN, HE SHOULD GO TO THE ED.

## 2016-06-01 NOTE — Consult Note (Signed)
REVIEWED-NO ADDITIONAL RECOMMENDATIONS.   Referring Provider: [facility]  Primary Care Physician:  Raiford Simmonds., PA-C Primary Gastroenterologist:  Dr. Oneida Alar   Date of Consultation: 06/01/16  Reason for Consultation: Crohn's disease, abdominal pain   HPI:  Joshua Robertson is a 40 y.o. year old male with history of ileocolonic Crohn's disease, history of ileal/ICV stricture, on Entyvio since August, down 20 lbs in past 3 months, chronic abdominal pain, recurrent vomiting. We have recommended further evaluation at [facility]. He was seen last at [facility] end of 2016. Colonoscopy at that time with stricture at the ileocecal valve which was not traversable. Biopsies showed chronic colitis with minimal activity. Patient was seen by general surgery at [facility] in August 2016 and they do not recommend surgery at that time it was felt he was clinically stable. CT today without evidence of obstruction, question wall thickening in sigmoid colon. Contrast had not reached colon at time of CT. WBC count elevated at 18.8.   Last seen by our office four days ago. Hot then cold. Soft BM this morning, prior to this BM several days ago. N/V Friday then none again until today after drinking oral contrast. He feels like he did better with Remicade then Entyvio. No rectal bleeding. No diarrhea. He is requesting food.   Past Medical History:  Diagnosis Date  . BMI between 19-24,adult JAN 2010 149 LBS  . Crohn's disease ([place]) AUG 2009 NUR [place]   HB 9.4 FERRITIN 26  140 LBS; no serologies-pt can't afford test  . Helicobacter pylori gastritis 2009  . Hypertension   . Migraine headache    NONE SINCE AGE 31  . Sacroiliitis ([place]) 2009  . Spondyloarthropathy ([place]) 2009 EROSIVE    Past Surgical History:  Procedure Laterality Date  . COLONOSCOPY    . COLONOSCOPY N/A 07/30/2013   stenotic IC VALVE. UNABLE TO INTUBATE ILEUM.    Prior to Admission medications   Medication Sig Start Date End Date Taking? Authorizing  Provider  benazepril (LOTENSIN) 40 MG tablet Take 1 tablet (40 mg total) by mouth daily. 06/02/11  Yes Dorie Rank, MD  cholecalciferol (VITAMIN D) 1000 units tablet Take 1 tablet (1,000 Units total) by mouth daily. BEGIN AFTER WEEKLY VIT D DOSING IS COMPLETE 04/11/16  Yes [name], MD  cloNIDine (CATAPRES) 0.1 MG tablet Take 1 tablet (0.1 mg total) by mouth 2 (two) times daily. 08/09/12  Yes [name], MD  hydrochlorothiazide (MICROZIDE) 12.5 MG capsule Take 12.5 mg by mouth daily.   Yes Historical Provider, MD  vedolizumab (ENTYVIO) 300 MG injection Inject 300 mg into the vein every 8 (eight) weeks.    Yes Historical Provider, MD  Nutritional Supplements (CARNATION INSTANT BREAKFAST PO) Take 1 Can by mouth 3 (three) times daily.    Historical Provider, MD    No current facility-administered medications for this encounter.    Current Outpatient Prescriptions  Medication Sig Dispense Refill  . benazepril (LOTENSIN) 40 MG tablet Take 1 tablet (40 mg total) by mouth daily. 30 tablet 2  . cholecalciferol (VITAMIN D) 1000 units tablet Take 1 tablet (1,000 Units total) by mouth daily. BEGIN AFTER WEEKLY VIT D DOSING IS COMPLETE 30 tablet 11  . cloNIDine (CATAPRES) 0.1 MG tablet Take 1 tablet (0.1 mg total) by mouth 2 (two) times daily. 60 tablet 11  . hydrochlorothiazide (MICROZIDE) 12.5 MG capsule Take 12.5 mg by mouth daily.    . vedolizumab (ENTYVIO) 300 MG injection Inject 300 mg into the vein every 8 (  eight) weeks.     . Nutritional Supplements (CARNATION INSTANT BREAKFAST PO) Take 1 Can by mouth 3 (three) times daily.      Allergies as of 06/01/2016 - Review Complete 06/01/2016  Allergen Reaction Noted  . Iodinated diagnostic agents Nausea And Vomiting 08/20/2013  . Azathioprine Nausea And Vomiting 11/25/2009  . Penicillins Swelling and Rash 08/15/2008  . Pentasa [mesalamine er] Rash 07/11/2013    Family History  Problem Relation Age of Onset  . Hypertension Mother   .  Hypertension Father   . Colon cancer Neg Hx   . Colon polyps Neg Hx   . Inflammatory bowel disease Neg Hx     Social History   Social History  . Marital status: Single    Spouse name: N/A  . Number of children: N/A  . Years of education: N/A   Occupational History  . Not on file.   Social History Main Topics  . Smoking status: Former Smoker    Packs/day: 0.50    Types: Cigarettes  . Smokeless tobacco: Never Used     Comment: Smokes 2-3 cigarettes daily  . Alcohol use 0.0 oz/week     Comment: occas.  . Drug use:     Types: Marijuana     Comment: last week   . Sexual activity: Yes    Birth control/ protection: None   Other Topics Concern  . Not on file   Social History Narrative   Smokes cigs  & pot. Has one girl AGE 76: DESTINY.    Review of Systems: As mentioned in HPI   Physical Exam: Vital signs in last 24 hours: Temp:  [98.8 F (37.1 C)] 98.8 F (37.1 C) (12/19 1013) Pulse Rate:  [72-86] 72 (12/19 1549) Resp:  [14-18] 18 (12/19 1336) BP: (112-131)/(80-99) 125/93 (12/19 1549) SpO2:  [98 %-100 %] 100 % (12/19 1549) Weight:  [123 lb (55.8 kg)] 123 lb (55.8 kg) (12/19 1013)   General:   Alert,  Well-developed, well-nourished, pleasant and cooperative in NAD Head:  Normocephalic and atraumatic. Eyes:  Sclera clear, no icterus.   Conjunctiva pink. Ears:  Normal auditory acuity. Nose:  No deformity, discharge,  or lesions. Lungs:  Clear throughout to auscultation.   No wheezes, crackles, or rhonchi. No acute distress. Heart:  Regular rate and rhythm; no murmurs, clicks, rubs,  or gallops. Abdomen:  Soft, mild TTP diffusely but more so LLQ and nondistended. No masses, hepatosplenomegaly or hernias noted. Normal bowel sounds, without guarding, and without rebound.   Rectal:  Deferred  Msk:  Symmetrical without gross deformities. Normal posture. Extremities:  Without edema. Neurologic:  Alert and  oriented x4 Psych:  Alert and cooperative. Normal mood and  affect.  Intake/Output from previous day: No intake/output data recorded. Intake/Output this shift: No intake/output data recorded.  Lab Results:  Recent Labs  06/01/16 1052  WBC 18.8*  HGB 9.6*  HCT 29.9*  PLT 569*   BMET  Recent Labs  06/01/16 1052  NA 136  K 3.5  CL 98*  CO2 29  GLUCOSE 97  BUN 13  CREATININE 0.81  CALCIUM 9.3   LFT  Recent Labs  06/01/16 1052  PROT 7.8  ALBUMIN 3.6  AST 15  ALT 10*  ALKPHOS 71  BILITOT 0.4    Studies/Results: Ct Abdomen Pelvis W Contrast  Result Date: 06/01/2016 CLINICAL DATA:  Generalized abdominal pain over the last 2 weeks. Personal history of Crohn's disease. EXAM: CT ABDOMEN AND PELVIS WITH CONTRAST TECHNIQUE: Multidetector CT  imaging of the abdomen and pelvis was performed using the standard protocol following bolus administration of intravenous contrast. CONTRAST:  144m ISOVUE-300 IOPAMIDOL (ISOVUE-300) INJECTION 61% COMPARISON:  03/13/2016 FINDINGS: The study suffers from severe motion degradation. Lower chest: Negative Hepatobiliary: There are multiple well-circumscribed subcentimeter low densities in the liver likely to represent small liver cysts. These are slightly progressive over time but not worrisome. No calcified gallstones. Pancreas: Normal Spleen: Normal Adrenals/Urinary Tract: Adrenal glands are normal. Kidneys are normal. Stomach/Bowel: No bowel pathology is seen, though there is extensive motion degradation. No dilated loops to suggest obstruction. Contrast has not yet reached the colon. One could question wall thickening in the sigmoid colon that could go along with colitis. Vascular/Lymphatic: Aortic atherosclerosis. No aneurysm. IVC is normal. Reproductive: Normal Other: Small to moderate amount of free fluid in the pelvis. Musculoskeletal: Chronic lower lumbar degenerative changes. IMPRESSION: No evidence of obstruction on this motion degraded study. Question of wall thickening in the sigmoid colon that  could go along with colitis. Moderate amount of free fluid in the pelvis. Electronically Signed   By: MNelson ChimesM.D.   On: 06/01/2016 15:33    Impression: 40year old male with known Crohn's ileocolitis, history of ileal/ICV stricture, chronic abdominal pain, presenting with persistent abdominal pain. No diarrhea, and N/V started after oral contrast; CT earlier today with no evidence of obstruction and question of wall thickening in the sigmoid colon. Contrast had not reached the colon at time of CT. Has previously failed Remicade and currently on Entyvio since August of this year. [facility] At this point, [facility] Recommend supportive measures, repeat CBC in am, stool studies if any evidence of diarrhea. No need for steroids at this time. Will continue to follow with you.   Plan: Supportive measures CBC in am Stool studies if evidence of diarrhea May have clear liquids this evening and advance as tolerated to low-residue (patient requesting to eat) [facility]     LOS: 0 days    06/01/2016, 3:53 PM

## 2016-06-01 NOTE — Progress Notes (Signed)
Noted  

## 2016-06-01 NOTE — Telephone Encounter (Signed)
Pt has arrived at ED per epic.

## 2016-06-01 NOTE — H&P (Signed)
History and Physical    Joshua Robertson VZD:[id number] DOB: [birth year] DOA: 06/01/2016  PCP: Raiford Simmonds., PA-C Consultants:  Oneida Alar - GI Patient coming from: home - lives with girlfriend; NOK: girlfriend, doesn't know her number  Chief Complaint: abdominal pain, nausea  HPI: Joshua Robertson is a 40 y.o. male with medical history significant of HTN and Crohn's disease presenting with refractory symptoms of Crohn's.  He reports that "my Crohn's has been acting up something serious."  Symptoms for the few months, has been talking to GI and thinks it may be related to medication change - previously on Remicade but changed to [facility].  "She assured me that the medication was doing what it's supposed to"  but has been having ongoing pain and is unable to eat.  Does not tend to have a lot of diarrhea.  Typical Crohn's symptoms for him are a lot of pain and nausea, inability to eat.  This is what he has now.  He came in today because his girlfriend made him come.  He saw them Friday "but after last night it had been relentless."  Had a soft BM this AM, still formed.  3-4 on the [facility] stool scale.  Did vomit twice on Friday, but none in the last few days.  He had been on Remicade for a few years.  By week 6, his symptoms would start worsening.  Thought he had significant inflammation, so changed to [facility].  Has never had pain control with Entyvio.  This is his third related ER visit since starting Entyvio.  Was on Humira in the past, and this never worked to control his pain (similar to this).  Changed back to Remicade.     ED Course: Per Dr. Lorenso Courier: 2951: will start IV, give 1L fluid bolus, morphine 61m, and obtain CMET, CBC, and lipase.  1145: Leukocytosis worsening from Friday (11.7 >18.8). Discussed case with Dr. FOneida Alar patient's GI provider. She recommended CT abdomen/pelvis with IV and oral contrast. She stressed ensuring imaging was done 2 hours after contrast load to ensure good imaging. No  need for steroids or antibiotics currently. No admission currently, as if the patient has an obstruction [facility]  1230: patient endorses continued abdominal pain, however improved. Went from 12/10 to 8/10. Will give another dose of IV pain medication. Currently drinking oral contrast for CT. He was pre-treated with Zofran given h/o N/V. He denies a h/o rash, hives, pruritus, throat swelling with any contrast..  1445: patient's pain improved, but not resolved. He is next in line for CT. IF CT reveals obstruction, [facility], if it does not show obstruction, [facility]  1530: CT performed, motion degraded. Official read pending per radiologist. Discussing with patient, he had some nausea with the CT but no vomiting. He still rates his pain as a 7/10. Will await final read to [facility]  1550: discussed CT results with Dr. FOneida Alar Even waiting 2 hrs after contrast, it still did not make it to the colon. Motion degraded, however concerning for possible colitis. She suspects viral etiology. Given his pain and PO intake cannot be managed at home, will have him observed overnight. Spoke with Dr. YLorin Mercywith [facility] who will resume care.    Review of Systems: As per HPI; otherwise 10 point review of systems reviewed and negative.  Ambulatory Status:  Ambulates without difficulty  Past Medical History:  Diagnosis Date  . BMI between 19-24,adult JAN 2010 149 LBS  . Crohn's disease ([place]) AUG 2009 NUR [place]   HB 9.4 FERRITIN 26  140 LBS; no serologies-pt can't afford test  . Helicobacter pylori gastritis 2009  . Hypertension   . Migraine headache    NONE SINCE AGE 40  . Sacroiliitis ([place]) 2009  . Spondyloarthropathy ([place]) 2009 EROSIVE    Past Surgical History:  Procedure Laterality Date  . COLONOSCOPY    .  COLONOSCOPY N/A 07/30/2013   stenotic IC VALVE. UNABLE TO INTUBATE ILEUM.    Social History   Social History  . Marital status: Single    Spouse name: N/A  . Number of children: N/A  . Years of education: N/A   Occupational History  . unemployed    Social History Main Topics  . Smoking status: Former Smoker    Packs/day: 0.50    Types: Cigarettes    Quit date: 03/02/2016  . Smokeless tobacco: Never Used     Comment: Smokes 2-3 cigarettes daily  . Alcohol use 0.0 oz/week     Comment: very rare  . Drug use:     Types: Marijuana     Comment: last week   . Sexual activity: Yes    Birth control/ protection: None   Other Topics Concern  . Not on file   Social History Narrative   Smokes cigs  & pot. Has one girl AGE 32: DESTINY.    Allergies  Allergen Reactions  . Iodinated Diagnostic Agents Nausea And Vomiting  . Azathioprine Nausea And Vomiting  . Penicillins Swelling and Rash    Has patient had a PCN reaction causing immediate rash, facial/tongue/throat swelling, SOB or lightheadedness with hypotension: Yes Has patient had a PCN reaction causing severe rash involving mucus membranes or skin necrosis: Yes Has patient had a PCN reaction that required hospitalization No Has patient had a PCN reaction occurring within the last 10 years: No If all of the above answers are "NO", then may proceed with Cephalosporin use.   Marland Kitchen Pentasa [Mesalamine Er] Rash    Family History  Problem Relation Age of Onset  . Hypertension Mother   . Hypertension Father   . Colon cancer Neg Hx   . Colon polyps Neg Hx   . Inflammatory bowel disease Neg Hx     Prior to Admission medications   Medication Sig Start Date End Date Taking? Authorizing Provider  benazepril (LOTENSIN) 40 MG tablet Take 1 tablet (40 mg total) by mouth daily. 06/02/11  Yes Dorie Rank, MD  cholecalciferol (VITAMIN D) 1000 units tablet Take 1 tablet (1,000 Units total) by mouth daily. BEGIN AFTER WEEKLY VIT D DOSING IS  COMPLETE 04/11/16  Yes [name], MD  cloNIDine (CATAPRES) 0.1 MG tablet Take 1 tablet (0.1 mg total) by mouth 2 (two) times daily. 08/09/12  Yes [name], MD  hydrochlorothiazide (MICROZIDE) 12.5 MG capsule Take 12.5 mg by mouth daily.   Yes Historical Provider, MD  vedolizumab (ENTYVIO) 300 MG injection Inject 300 mg into the vein every 8 (eight) weeks.    Yes Historical Provider, MD  Nutritional Supplements (CARNATION INSTANT BREAKFAST PO) Take 1 Can by mouth 3 (three) times daily.    Historical Provider, MD    Physical Exam: Vitals:   06/01/16 1730 06/01/16 1831 06/01/16 1842 06/01/16 2023  BP: 125/86 (!) 142/82 (!) 142/82   Pulse: 65 64 64  Resp:  18    Temp:  98.4 F (36.9 C) 98.4 F (36.9 C)   TempSrc:  Oral Oral   SpO2: 98% 100% 100% 97%  Weight:  56.5 kg (124 lb 8 oz) 56.2 kg (124 lb)   Height:  5' 9.5" (1.765 m) 5' 9.5" (1.765 m)      General: Appears calm and comfortable and is NAD Eyes:  PERRL, EOMI, normal lids, iris ENT:  grossly normal hearing, lips & tongue, mmm Neck:  no LAD, masses or thyromegaly Cardiovascular:  RRR, no m/r/g. No LE edema.  Respiratory:  CTA bilaterally, no w/r/r. Normal respiratory effort. Abdomen:  NABS (not hyperactive); voluntary guarding diffusely without obvious TTP Skin:  no rash or induration seen on limited exam Musculoskeletal:  grossly normal tone BUE/BLE, good ROM, no bony abnormality Psychiatric:  grossly normal mood and affect, speech fluent and appropriate, AOx3 Neurologic:  CN 2-12 grossly intact, moves all extremities in coordinated fashion, sensation intact  Labs on Admission: I have personally reviewed following labs and imaging studies  CBC:  Recent Labs Lab 05/28/16 0943 06/01/16 1052  WBC 11.7* 18.8*  NEUTROABS 7,605 14.6*  HGB 10.1* 9.6*  HCT 32.8* 29.9*  MCV 72.7* 71.4*  PLT 692* 268*   Basic Metabolic Panel:  Recent Labs Lab 05/28/16 0943 06/01/16 1052  NA 140 136  K 4.0 3.5  CL 99 98*    CO2 31 29  GLUCOSE 86 97  BUN 17 13  CREATININE 0.94 0.81  CALCIUM 10.0 9.3   GFR: Estimated Creatinine Clearance: 96.4 mL/min (by C-G formula based on SCr of 0.81 mg/dL). Liver Function Tests:  Recent Labs Lab 05/28/16 0943 06/01/16 1052  AST 16 15  ALT 11 10*  ALKPHOS 77 71  BILITOT 0.5 0.4  PROT 8.0 7.8  ALBUMIN 4.2 3.6    Recent Labs Lab 05/28/16 0943 06/01/16 1104  LIPASE <5* 17   No results for input(s): AMMONIA in the last 168 hours. Coagulation Profile: No results for input(s): INR, PROTIME in the last 168 hours. Cardiac Enzymes: No results for input(s): CKTOTAL, CKMB, CKMBINDEX, TROPONINI in the last 168 hours. BNP (last 3 results) No results for input(s): PROBNP in the last 8760 hours. HbA1C: No results for input(s): HGBA1C in the last 72 hours. CBG: No results for input(s): GLUCAP in the last 168 hours. Lipid Profile: No results for input(s): CHOL, HDL, LDLCALC, TRIG, CHOLHDL, LDLDIRECT in the last 72 hours. Thyroid Function Tests: No results for input(s): TSH, T4TOTAL, FREET4, T3FREE, THYROIDAB in the last 72 hours. Anemia Panel: No results for input(s): VITAMINB12, FOLATE, FERRITIN, TIBC, IRON, RETICCTPCT in the last 72 hours. Urine analysis:    Component Value Date/Time   COLORURINE YELLOW 05/03/2016 2144   APPEARANCEUR CLEAR 05/03/2016 2144   LABSPEC 1.025 05/03/2016 2144   PHURINE 6.0 05/03/2016 2144   GLUCOSEU NEGATIVE 05/03/2016 2144   HGBUR NEGATIVE 05/03/2016 2144   BILIRUBINUR SMALL (A) 05/03/2016 2144   KETONESUR 15 (A) 05/03/2016 2144   PROTEINUR TRACE (A) 05/03/2016 2144   UROBILINOGEN 0.2 11/26/2014 1430   NITRITE NEGATIVE 05/03/2016 2144   LEUKOCYTESUR TRACE (A) 05/03/2016 2144    Creatinine Clearance: Estimated Creatinine Clearance: 96.4 mL/min (by C-G formula based on SCr of 0.81 mg/dL).  Sepsis Labs: @LABRCNTIP (procalcitonin:4,lacticidven:4) )No results found for this or any previous visit (from the past 240 hour(s)).    Radiological Exams on Admission: Ct Abdomen Pelvis W Contrast  Result Date: 06/01/2016 CLINICAL DATA:  Generalized abdominal pain over the last 2  weeks. Personal history of Crohn's disease. EXAM: CT ABDOMEN AND PELVIS WITH CONTRAST TECHNIQUE: Multidetector CT imaging of the abdomen and pelvis was performed using the standard protocol following bolus administration of intravenous contrast. CONTRAST:  154m ISOVUE-300 IOPAMIDOL (ISOVUE-300) INJECTION 61% COMPARISON:  03/13/2016 FINDINGS: The study suffers from severe motion degradation. Lower chest: Negative Hepatobiliary: There are multiple well-circumscribed subcentimeter low densities in the liver likely to represent small liver cysts. These are slightly progressive over time but not worrisome. No calcified gallstones. Pancreas: Normal Spleen: Normal Adrenals/Urinary Tract: Adrenal glands are normal. Kidneys are normal. Stomach/Bowel: No bowel pathology is seen, though there is extensive motion degradation. No dilated loops to suggest obstruction. Contrast has not yet reached the colon. One could question wall thickening in the sigmoid colon that could go along with colitis. Vascular/Lymphatic: Aortic atherosclerosis. No aneurysm. IVC is normal. Reproductive: Normal Other: Small to moderate amount of free fluid in the pelvis. Musculoskeletal: Chronic lower lumbar degenerative changes. IMPRESSION: No evidence of obstruction on this motion degraded study. Question of wall thickening in the sigmoid colon that could go along with colitis. Moderate amount of free fluid in the pelvis. Electronically Signed   By: MNelson ChimesM.D.   On: 06/01/2016 15:33    EKG: not done  Assessment/Plan Principal Problem:   Crohn's colitis ([place] Active Problems:   Essential hypertension   Marijuana abuse   Crohn's colitis -Patient with known Crohn's that was reasonably controlled for short durations on Remicade, but the symptoms recurred prior to the next dose -[facility]  [facility] reports persistent symptoms throughout the duration of this medication -This seems to be confirmed by multiple ER visits and outpatient notes -Patient presenting today with chronic symptoms which have worsened -He does not have typical viral gastroenteritis symptoms such as vomiting and diarrhea -While it is possible that this is a viral syndrome - there is no reason that a patient with Crohn's cant get this just like someone else - since his symptoms have been persistent for months and are consistent with his typical Crohn's symptoms and since he does not have typical VGE symptoms, it seems more likely that this is a Crohn's flare -Will treat him as a Crohn's flare - NPO for bowel rest, IVF, pain management with morphine//Zofran, Cirpo/Flagyl, steroids -There is no evidence of a stricture or other cause for surgical intervention at this time -If not improving in the next 24-48 hours, would suggest GI consultation -[facility] -Patient would really prefer to resume Remicaid. -Would defer to GI.  HTN -Well controlled in the ER. -Continue HCTZ and Catapres.  Marijuana dependence -Encourage cessation. -This may also be contributing to his symptoms, as marijuana can have a paradoxical reaction that actually causes nausea in some patients (cannabinoid hyperemesis syndrome).  DVT prophylaxis:  Lovenox  Code Status: Full - confirmed with patient Family Communication: None present Disposition Plan:  Home once clinically improved Consults called: None  Admission status: Admit - It is my clinical opinion that admission to INPATIENT is reasonable and necessary because this patient will require at least 2 midnights in the hospital to treat this condition based on the medical complexity of the problems presented.  Given the aforementioned information, the predictability of an adverse outcome is felt to be significant.      JKarmen BongoMD [facility]  If 7PM-7AM, please contact night-coverage [email or web address] Password [email or web address]  06/01/2016, 9:56 PM

## 2016-06-01 NOTE — Progress Notes (Signed)
Iron/sat suggestive of iron def, normal ferritin but could be relatively elevated due to active Crohn's.  Patient currently in ER and plans for CT imagine. WBC increased today compared to these labs.

## 2016-06-02 ENCOUNTER — Encounter (HOSPITAL_COMMUNITY): Payer: Self-pay | Admitting: Gastroenterology

## 2016-06-02 DIAGNOSIS — K529 Noninfective gastroenteritis and colitis, unspecified: Secondary | ICD-10-CM

## 2016-06-02 DIAGNOSIS — R1084 Generalized abdominal pain: Secondary | ICD-10-CM | POA: Diagnosis not present

## 2016-06-02 DIAGNOSIS — Z8249 Family history of ischemic heart disease and other diseases of the circulatory system: Secondary | ICD-10-CM | POA: Diagnosis not present

## 2016-06-02 DIAGNOSIS — K50118 Crohn's disease of large intestine with other complication: Secondary | ICD-10-CM | POA: Diagnosis not present

## 2016-06-02 DIAGNOSIS — F1220 Cannabis dependence, uncomplicated: Secondary | ICD-10-CM | POA: Diagnosis present

## 2016-06-02 DIAGNOSIS — I10 Essential (primary) hypertension: Secondary | ICD-10-CM | POA: Diagnosis present

## 2016-06-02 DIAGNOSIS — Z87891 Personal history of nicotine dependence: Secondary | ICD-10-CM | POA: Diagnosis not present

## 2016-06-02 DIAGNOSIS — Z888 Allergy status to other drugs, medicaments and biological substances status: Secondary | ICD-10-CM | POA: Diagnosis not present

## 2016-06-02 DIAGNOSIS — E440 Moderate protein-calorie malnutrition: Secondary | ICD-10-CM | POA: Insufficient documentation

## 2016-06-02 DIAGNOSIS — K56699 Other intestinal obstruction unspecified as to partial versus complete obstruction: Secondary | ICD-10-CM | POA: Diagnosis not present

## 2016-06-02 DIAGNOSIS — Z79899 Other long term (current) drug therapy: Secondary | ICD-10-CM | POA: Diagnosis not present

## 2016-06-02 DIAGNOSIS — K5080 Crohn's disease of both small and large intestine without complications: Secondary | ICD-10-CM | POA: Diagnosis present

## 2016-06-02 DIAGNOSIS — Z681 Body mass index (BMI) 19 or less, adult: Secondary | ICD-10-CM | POA: Diagnosis not present

## 2016-06-02 DIAGNOSIS — Z91041 Radiographic dye allergy status: Secondary | ICD-10-CM | POA: Diagnosis not present

## 2016-06-02 DIAGNOSIS — Z880 Allergy status to penicillin: Secondary | ICD-10-CM | POA: Diagnosis not present

## 2016-06-02 LAB — BASIC METABOLIC PANEL
Anion gap: 9 (ref 5–15)
BUN: 13 mg/dL (ref 6–20)
CO2: 31 mmol/L (ref 22–32)
CREATININE: 0.82 mg/dL (ref 0.61–1.24)
Calcium: 9.4 mg/dL (ref 8.9–10.3)
Chloride: 96 mmol/L — ABNORMAL LOW (ref 101–111)
GFR calc Af Amer: >60 mL/min (ref >60)
Glucose, Bld: 120 mg/dL — ABNORMAL HIGH (ref 65–99)
POTASSIUM: 4.3 mmol/L (ref 3.5–5.1)
SODIUM: 136 mmol/L (ref 135–145)

## 2016-06-02 LAB — CBC
HEMATOCRIT: 29 % — AB (ref 39.0–52.0)
Hemoglobin: 9.4 g/dL — ABNORMAL LOW (ref 13.0–17.0)
MCH: 23 pg — AB (ref 26.0–34.0)
MCHC: 32.4 g/dL (ref 30.0–36.0)
MCV: 71.1 fL — AB (ref 78.0–100.0)
PLATELETS: 580 10*3/uL — AB (ref 150–400)
RBC: 4.08 MIL/uL — ABNORMAL LOW (ref 4.22–5.81)
RDW: 15.9 % — AB (ref 11.5–15.5)
WBC: 13.3 10*3/uL — ABNORMAL HIGH (ref 4.0–10.5)

## 2016-06-02 NOTE — Progress Notes (Signed)
Initial Nutrition Assessment  DOCUMENTATION CODES:   Non-severe (moderate) malnutrition in context of acute illness/injury, Underweight  INTERVENTION:  Boost Breeze po TID, each supplement provides 250 kcal and 9 grams of protein   Provide alternant Soft food options to maximize meal intake    NUTRITION DIAGNOSIS:   Malnutrition related to acute illness as evidenced by mild depletion of body fat, mild depletion of muscle mass.   GOAL:   Patient will meet greater than or equal to 90% of their needs   MONITOR:   Diet advancement, Supplement acceptance, Weight trends, PO intake  REASON FOR ASSESSMENT:   Malnutrition Screening Tool    ASSESSMENT:  Patient is a 40 yo male with hx of Chron's (since 2012) and HTN. He started having abdominal pain last Friday and has eaten very little since. He had a grilled cheese on Saturday and pack of peanut butter crackers on Sunday.He primarily drinks water and juices for beverages and avoids regular soft drinks. Patient says he has a good understanding of the "no" foods and has learned by trial and error what he tolerates well. He eats mostly chicken and pork. Avoids nuts and citrus fruits and watermelon. He is hungry during RD visit and wants his diet advanced. Nursing is aware an is following up with GI. He is taking clear liquids well (75-100% consumed and drank 100% Boost Breeze) with his lunch.  His weight is down significantly compared to 4 months ago (13%) and trending toward significant for the past month (down 4.7%). Overall down 19# (8.6 kg).  Nutrition-Focused physical exam findings are mild-moderate fat and muscle depletion, and no edema. Patient meets criteria for moderate malnutrition and is at high risk for progressing to severe based on his weight loss and underweight status chronic Chron's disease.   Recent Labs Lab 05/28/16 0943 06/01/16 1052 06/02/16 0405  NA 140 136 136  K 4.0 3.5 4.3  CL 99 98* 96*  CO2 31 29 31   BUN  17 13 13   CREATININE 0.94 0.81 0.82  CALCIUM 10.0 9.3 9.4  GLUCOSE 86 97 120*   Labs/meds: reviewed  Diet Order:  DIET SOFT Room service appropriate? Yes; Fluid consistency: Thin  Skin:  Reviewed, no issues  Last BM:  12/19  Height:   Ht Readings from Last 1 Encounters:  06/01/16 5' 9.5" (1.765 m)    Weight:   Wt Readings from Last 1 Encounters:  06/01/16 124 lb (56.2 kg)    Ideal Body Weight:  75 kg  BMI:  Body mass index is 18.05 kg/m.  Estimated Nutritional Needs:   Kcal:  2836-6294  Protein:  84-100 gr  Fluid:  2.0 liters daily  EDUCATION NEEDS:   No education needs identified at this time  Colman Cater MS,RD,CSG,LDN Office: #[phone] Pager: [phone]

## 2016-06-02 NOTE — Telephone Encounter (Signed)
I have sent in PA for entyvio to his insurance.

## 2016-06-02 NOTE — Care Management Note (Signed)
Case Management Note  Patient Details  Name: Winn Muehl MRN: [id number] Date of Birth: 1975/12/09  Subjective/Objective:                  Pt admitted with Crohn's collitis. Chart reviewed for CM needs. Pt is from home, lives with wife and is ind with ADL's. He has PCP, transportation to appointments and insurance with drug coverage.   Action/Plan: Plan for return home with self care. No CM needs anticipated.   Expected Discharge Date:     06/05/2016             Expected Discharge Plan:  Home/Self Care  In-House Referral:  NA  Discharge planning Services  CM Consult  Post Acute Care Choice:  NA Choice offered to:  NA  Status of Service:  Completed, signed off  Sherald Barge, RN 06/02/2016, 9:00 AM

## 2016-06-02 NOTE — Progress Notes (Signed)
Subjective:  Wants to eat. Feels a lot better. Pain much improved. Has not had diarrhea.   Objective: Vital signs in last 24 hours: Temp:  [97.6 F (36.4 C)-98.8 F (37.1 C)] 97.6 F (36.4 C) (12/20 0618) Pulse Rate:  [59-86] 59 (12/20 0618) Resp:  [14-18] 16 (12/20 0618) BP: (101-142)/(62-99) 101/62 (12/20 0618) SpO2:  [97 %-100 %] 100 % (12/20 0618) Weight:  [123 lb (55.8 kg)-124 lb 8 oz (56.5 kg)] 124 lb (56.2 kg) (12/19 1842) Last BM Date: 06/01/16 General:   Alert,  Well-developed, well-nourished, pleasant and cooperative in NAD Head:  Normocephalic and atraumatic. Eyes:  Sclera clear, no icterus.   Abdomen:  Soft, nontender and nondistended.  Normal bowel sounds, without guarding, and without rebound.   Extremities:  Without clubbing, deformity or edema. Neurologic:  Alert and  oriented x4;  grossly normal neurologically. Skin:  Intact without significant lesions or rashes. Psych:  Alert and cooperative. Normal mood and affect.  Intake/Output from previous day: 12/19 0701 - 12/20 0700 In: 968.3 [I.V.:668.3; IV Piggyback:300] Out: 800 [Urine:800] Intake/Output this shift: No intake/output data recorded.  Lab Results: CBC  Recent Labs  06/01/16 1052 06/02/16 0405  WBC 18.8* 13.3*  HGB 9.6* 9.4*  HCT 29.9* 29.0*  MCV 71.4* 71.1*  PLT 569* 580*   BMET  Recent Labs  06/01/16 1052 06/02/16 0405  NA 136 136  K 3.5 4.3  CL 98* 96*  CO2 29 31  GLUCOSE 97 120*  BUN 13 13  CREATININE 0.81 0.82  CALCIUM 9.3 9.4   LFTs  Recent Labs  06/01/16 1052  BILITOT 0.4  ALKPHOS 71  AST 15  ALT 10*  PROT 7.8  ALBUMIN 3.6    Recent Labs  06/01/16 1104  LIPASE 17   PT/INR No results for input(s): LABPROT, INR in the last 72 hours.    Imaging Studies: Ct Abdomen Pelvis W Contrast  Result Date: 06/01/2016 CLINICAL DATA:  Generalized abdominal pain over the last 2 weeks. Personal history of Crohn's disease. EXAM: CT ABDOMEN AND PELVIS WITH CONTRAST  TECHNIQUE: Multidetector CT imaging of the abdomen and pelvis was performed using the standard protocol following bolus administration of intravenous contrast. CONTRAST:  149m ISOVUE-300 IOPAMIDOL (ISOVUE-300) INJECTION 61% COMPARISON:  03/13/2016 FINDINGS: The study suffers from severe motion degradation. Lower chest: Negative Hepatobiliary: There are multiple well-circumscribed subcentimeter low densities in the liver likely to represent small liver cysts. These are slightly progressive over time but not worrisome. No calcified gallstones. Pancreas: Normal Spleen: Normal Adrenals/Urinary Tract: Adrenal glands are normal. Kidneys are normal. Stomach/Bowel: No bowel pathology is seen, though there is extensive motion degradation. No dilated loops to suggest obstruction. Contrast has not yet reached the colon. One could question wall thickening in the sigmoid colon that could go along with colitis. Vascular/Lymphatic: Aortic atherosclerosis. No aneurysm. IVC is normal. Reproductive: Normal Other: Small to moderate amount of free fluid in the pelvis. Musculoskeletal: Chronic lower lumbar degenerative changes. IMPRESSION: No evidence of obstruction on this motion degraded study. Question of wall thickening in the sigmoid colon that could go along with colitis. Moderate amount of free fluid in the pelvis. Electronically Signed   By: MNelson ChimesM.D.   On: 06/01/2016 15:33  [2 weeks]   Assessment:  40year old male with known Crohn's ileocolitis, history of ileal/ICV stricture (present on CT 02/2016), chronic abdominal pain, presenting with persistent abdominal pain. No diarrhea, and N/V started after oral contrast; CT earlier today with no evidence of obstruction and question  of wall thickening in the sigmoid colon. Contrast had not reached the colon at time of CT. Has previously failed Remicade and currently on Entyvio since August of this year. Previously not felt to need surgery when seen at [facility] a year  ago. At this point, we have recommended referral again to [facility] for surgical consultation.   This morning his pain is much improved. No BM. He is hungry. WBC improved.   Plan: 1. Low fiber diet.   2. Discussed case with Dr. Gala Romney. He will need outpatient follow up with [facility] for managmenet of terminal ileal/IVC stricture. May require surgical resection of stricture by general surgery vs endoscopic dilation of stricture. Patient last seen at [facility] in 2016 and TCS at that time the ICV stricture was not traversable. He has notable 20 pound weight loss in the last 3 months.   Laureen Ochs. Bernarda Caffey [facility] [phone] 12/20/20179:41 AM     LOS: 1 day

## 2016-06-02 NOTE — Progress Notes (Signed)
PROGRESS NOTE    [name]  GHW:[id number] DOB: [birth year] DOA: 06/01/2016 PCP: [name], PA-C    Brief Narrative: Joshua Robertson is a 40 y.o. male with medical history significant of HTN and Crohn's disease presenting with refractory symptoms of Crohn's. Patient presents complaining of worsening abdominal pain, poor oral intake. CT scan: question thickening in sigmoid colon. WCB at 29.   Assessment & Plan:   Principal Problem:   Crohn's colitis [facility]) Active Problems:   Essential hypertension   Marijuana abuse   1-Chroh ' s flare vs Intermittent partial SBO secondary to chronic terminal ileal /ICV stricture.  -History of ileal/ICV stricture (present on CT 02/2016), -Presents with abdominal pain, leukocytosis. Ct scan inflammation sigmoid colon, contrast has not reached colon at time of ct. -Previously failed Remicade and currently on Entyvio since August of this year.  -GI consulted and following.  Improved on IV solumedrol and IV antibiotics.  WBC trending down.  Start clear diet   2-HTN; hold diuretics. SBP in the 110. Holder parameters for clonidine.   3-Marijuana dependence -Encourage cessation.  DVT prophylaxis: Lovenox Code Status:  Full code.  Family Communication: Care discussed with patient.  Disposition Plan: remain inpatient.   Consultants:   GI    Procedures:   none   Antimicrobials:  Cipro 12-19  Flagyl 12-19   Subjective: He is feeling much better. Abdominal pain 2/10, yesterday 10/10    Objective: Vitals:   06/01/16 1842 06/01/16 2023 06/01/16 2256 06/02/16 0618  BP: (!) 142/82  117/71 101/62  Pulse: 64  62 (!) 59  Resp:   16 16  Temp: 98.4 F (36.9 C)  98.6 F (37 C) 97.6 F (36.4 C)  TempSrc: Oral  Oral Oral  SpO2: 100% 97% 99% 100%  Weight: 56.2 kg (124 lb)     Height: 5' 9.5" (1.765 m)       Intake/Output Summary (Last 24 hours) at 06/02/16 1158 Last data filed at 06/02/16 0619  Gross per 24 hour  Intake            968.33 ml  Output              800 ml  Net           168.33 ml   Filed Weights   06/01/16 1013 06/01/16 1831 06/01/16 1842  Weight: 55.8 kg (123 lb) 56.5 kg (124 lb 8 oz) 56.2 kg (124 lb)    Examination:  General exam: Appears calm and comfortable  Respiratory system: Clear to auscultation. Respiratory effort normal. Cardiovascular system: S1 & S2 heard, RRR. No JVD, murmurs, rubs, gallops or clicks. No pedal edema. Gastrointestinal system: Abdomen is nondistended, soft and nontender. No organomegaly or masses felt. Normal bowel sounds heard. Central nervous system: Alert and oriented. No focal neurological deficits. Extremities: Symmetric 5 x 5 power. Skin: No rashes, lesions or ulcers Psychiatry: Judgement and insight appear normal. Mood & affect appropriate.     Data Reviewed: I have personally reviewed following labs and imaging studies  CBC:  Recent Labs Lab 05/28/16 0943 06/01/16 1052 06/02/16 0405  WBC 11.7* 18.8* 13.3*  NEUTROABS 7,605 14.6*  --   HGB 10.1* 9.6* 9.4*  HCT 32.8* 29.9* 29.0*  MCV 72.7* 71.4* 71.1*  PLT 692* 569* 789*   Basic Metabolic Panel:  Recent Labs Lab 05/28/16 0943 06/01/16 1052 06/02/16 0405  NA 140 136 136  K 4.0 3.5 4.3  CL 99 98* 96*  CO2 31 29 31   GLUCOSE 86  97 120*  BUN 17 13 13   CREATININE 0.94 0.81 0.82  CALCIUM 10.0 9.3 9.4   GFR: Estimated Creatinine Clearance: 95.2 mL/min (by C-G formula based on SCr of 0.82 mg/dL). Liver Function Tests:  Recent Labs Lab 05/28/16 0943 06/01/16 1052  AST 16 15  ALT 11 10*  ALKPHOS 77 71  BILITOT 0.5 0.4  PROT 8.0 7.8  ALBUMIN 4.2 3.6    Recent Labs Lab 05/28/16 0943 06/01/16 1104  LIPASE <5* 17   No results for input(s): AMMONIA in the last 168 hours. Coagulation Profile: No results for input(s): INR, PROTIME in the last 168 hours. Cardiac Enzymes: No results for input(s): CKTOTAL, CKMB, CKMBINDEX, TROPONINI in the last 168 hours. BNP (last 3 results) No  results for input(s): PROBNP in the last 8760 hours. HbA1C: No results for input(s): HGBA1C in the last 72 hours. CBG: No results for input(s): GLUCAP in the last 168 hours. Lipid Profile: No results for input(s): CHOL, HDL, LDLCALC, TRIG, CHOLHDL, LDLDIRECT in the last 72 hours. Thyroid Function Tests: No results for input(s): TSH, T4TOTAL, FREET4, T3FREE, THYROIDAB in the last 72 hours. Anemia Panel: No results for input(s): VITAMINB12, FOLATE, FERRITIN, TIBC, IRON, RETICCTPCT in the last 72 hours. Sepsis Labs: No results for input(s): PROCALCITON, LATICACIDVEN in the last 168 hours.  No results found for this or any previous visit (from the past 240 hour(s)).       Radiology Studies: Ct Abdomen Pelvis W Contrast  Result Date: 06/01/2016 CLINICAL DATA:  Generalized abdominal pain over the last 2 weeks. Personal history of Crohn's disease. EXAM: CT ABDOMEN AND PELVIS WITH CONTRAST TECHNIQUE: Multidetector CT imaging of the abdomen and pelvis was performed using the standard protocol following bolus administration of intravenous contrast. CONTRAST:  169m ISOVUE-300 IOPAMIDOL (ISOVUE-300) INJECTION 61% COMPARISON:  03/13/2016 FINDINGS: The study suffers from severe motion degradation. Lower chest: Negative Hepatobiliary: There are multiple well-circumscribed subcentimeter low densities in the liver likely to represent small liver cysts. These are slightly progressive over time but not worrisome. No calcified gallstones. Pancreas: Normal Spleen: Normal Adrenals/Urinary Tract: Adrenal glands are normal. Kidneys are normal. Stomach/Bowel: No bowel pathology is seen, though there is extensive motion degradation. No dilated loops to suggest obstruction. Contrast has not yet reached the colon. One could question wall thickening in the sigmoid colon that could go along with colitis. Vascular/Lymphatic: Aortic atherosclerosis. No aneurysm. IVC is normal. Reproductive: Normal Other: Small to moderate  amount of free fluid in the pelvis. Musculoskeletal: Chronic lower lumbar degenerative changes. IMPRESSION: No evidence of obstruction on this motion degraded study. Question of wall thickening in the sigmoid colon that could go along with colitis. Moderate amount of free fluid in the pelvis. Electronically Signed   By: MNelson ChimesM.D.   On: 06/01/2016 15:33        Scheduled Meds: . ciprofloxacin  400 mg Intravenous Q12H  . cloNIDine  0.1 mg Oral BID  . enoxaparin (LOVENOX) injection  40 mg Subcutaneous Q24H  . feeding supplement  1 Container Oral TID BM  . methylPREDNISolone (SOLU-MEDROL) injection  80 mg Intravenous Q12H  . metronidazole  500 mg Intravenous Q8H   Continuous Infusions: . lactated ringers 100 mL/hr at 06/01/16 2019     LOS: 1 day    Time spent: 35 minutes.     RElmarie Shiley MD [facility] Pager [phone] If 7PM-7AM, please contact night-coverage [email or web address] Password [email or web address] 06/02/2016, 11:58 AM

## 2016-06-03 ENCOUNTER — Telehealth: Payer: Self-pay | Admitting: Gastroenterology

## 2016-06-03 ENCOUNTER — Encounter: Payer: Self-pay | Admitting: Gastroenterology

## 2016-06-03 DIAGNOSIS — K56699 Other intestinal obstruction unspecified as to partial versus complete obstruction: Secondary | ICD-10-CM

## 2016-06-03 LAB — BASIC METABOLIC PANEL
Anion gap: 7 (ref 5–15)
BUN: 19 mg/dL (ref 6–20)
CHLORIDE: 103 mmol/L (ref 101–111)
CO2: 28 mmol/L (ref 22–32)
CREATININE: 0.85 mg/dL (ref 0.61–1.24)
Calcium: 9.2 mg/dL (ref 8.9–10.3)
GFR calc Af Amer: >60 mL/min (ref >60)
GFR calc non Af Amer: >60 mL/min (ref >60)
Glucose, Bld: 145 mg/dL — ABNORMAL HIGH (ref 65–99)
Potassium: 4.3 mmol/L (ref 3.5–5.1)
Sodium: 138 mmol/L (ref 135–145)

## 2016-06-03 MED ORDER — METRONIDAZOLE 500 MG PO TABS: 500.0000 mg | ORAL_TABLET | Freq: Three times a day (TID) | ORAL | 0 refills | Status: DC

## 2016-06-03 MED ORDER — PREDNISONE 10 MG (21) PO TBPK: ORAL_TABLET | ORAL | 0 refills | Status: DC

## 2016-06-03 MED ORDER — CIPROFLOXACIN HCL 500 MG PO TABS: 500.0000 mg | ORAL_TABLET | Freq: Two times a day (BID) | ORAL | 0 refills | Status: DC

## 2016-06-03 NOTE — Progress Notes (Signed)
Subjective:  Patient tolerating solid food. He wants to go home. He has not needed pain medication in over 24 hours.   Objective: Vital signs in last 24 hours: Temp:  [97.5 F (36.4 C)-98.2 F (36.8 C)] 97.5 F (36.4 C) (12/21 0455) Pulse Rate:  [58-68] 58 (12/21 0455) Resp:  [18] 18 (12/21 0455) BP: (98-124)/(59-74) 107/68 (12/21 0455) SpO2:  [97 %-100 %] 100 % (12/21 0455) Last BM Date: 06/01/16 General:   Alert,  Well-developed, well-nourished, pleasant and cooperative in NAD Head:  Normocephalic and atraumatic. Eyes:  Sclera clear, no icterus.  Abdomen:  Soft, nontender and nondistended. Normal bowel sounds, without guarding, and without rebound.   Extremities:  Without clubbing, deformity or edema. Neurologic:  Alert and  oriented x4;  grossly normal neurologically. Skin:  Intact without significant lesions or rashes. Psych:  Alert and cooperative. Normal mood and affect.  Intake/Output from previous day: 12/20 0701 - 12/21 0700 In: 720 [P.O.:720] Out: 900 [Urine:900] Intake/Output this shift: No intake/output data recorded.  Lab Results: CBC  Recent Labs  06/01/16 1052 06/02/16 0405  WBC 18.8* 13.3*  HGB 9.6* 9.4*  HCT 29.9* 29.0*  MCV 71.4* 71.1*  PLT 569* 580*   BMET  Recent Labs  06/01/16 1052 06/02/16 0405 06/03/16 0359  NA 136 136 138  K 3.5 4.3 4.3  CL 98* 96* 103  CO2 29 31 28   GLUCOSE 97 120* 145*  BUN 13 13 19   CREATININE 0.81 0.82 0.85  CALCIUM 9.3 9.4 9.2   LFTs  Recent Labs  06/01/16 1052  BILITOT 0.4  ALKPHOS 71  AST 15  ALT 10*  PROT 7.8  ALBUMIN 3.6    Recent Labs  06/01/16 1104  LIPASE 17   PT/INR No results for input(s): LABPROT, INR in the last 72 hours.    Imaging Studies: Ct Abdomen Pelvis W Contrast  Result Date: 06/01/2016 CLINICAL DATA:  Generalized abdominal pain over the last 2 weeks. Personal history of Crohn's disease. EXAM: CT ABDOMEN AND PELVIS WITH CONTRAST TECHNIQUE: Multidetector CT imaging of  the abdomen and pelvis was performed using the standard protocol following bolus administration of intravenous contrast. CONTRAST:  157m ISOVUE-300 IOPAMIDOL (ISOVUE-300) INJECTION 61% COMPARISON:  03/13/2016 FINDINGS: The study suffers from severe motion degradation. Lower chest: Negative Hepatobiliary: There are multiple well-circumscribed subcentimeter low densities in the liver likely to represent small liver cysts. These are slightly progressive over time but not worrisome. No calcified gallstones. Pancreas: Normal Spleen: Normal Adrenals/Urinary Tract: Adrenal glands are normal. Kidneys are normal. Stomach/Bowel: No bowel pathology is seen, though there is extensive motion degradation. No dilated loops to suggest obstruction. Contrast has not yet reached the colon. One could question wall thickening in the sigmoid colon that could go along with colitis. Vascular/Lymphatic: Aortic atherosclerosis. No aneurysm. IVC is normal. Reproductive: Normal Other: Small to moderate amount of free fluid in the pelvis. Musculoskeletal: Chronic lower lumbar degenerative changes. IMPRESSION: No evidence of obstruction on this motion degraded study. Question of wall thickening in the sigmoid colon that could go along with colitis. Moderate amount of free fluid in the pelvis. Electronically Signed   By: MNelson ChimesM.D.   On: 06/01/2016 15:33  [2 weeks]   Assessment: 40year old male with known Crohn's ileocolitis, history of ileal/ICV stricture (present on CT 02/2016), chronic abdominal pain, presenting with persistent abdominal pain. No diarrhea, and N/V started after oral contrast; CT this admission with no evidence of obstruction and question of wall thickening in the sigmoid colon.  Contrast had not reached the colon at time of CT. Has previously failed Remicade and currently on Entyvio since August of this year. Previously not felt to need surgery when seen at [facility]. Seen by Dr. Eduard Roux, GI at [facility]  2016, TCS at that time with ICV stricture not traversable.   He has 20 pound weight loss in the past 3 months and has overall not done well. Likely as a result of fibro-stenosing component to his Crohn's rather than acute inflammation.    Plan: 1. Stable for D/C from GI standpoint. We will arrange for follow up with Dr. Eduard Roux for consideration of balloon dilation of stricture vs surgery with stricturoplasty. We will continue Entyvio as outpatient.  2. D/C cipro/flagyl at discharge.  3. Transition to low dose prednisone with taper at discharge (59m daily for 5 days, 255mdaily for 5 days, 1037maily for 5 days, then stop).  LesLaureen OchsewBernarda CaffeycKaweah Delta Medical Centerstroenterology Associates [phone]/21/[id number]:57 AM     LOS: 2 days

## 2016-06-03 NOTE — Progress Notes (Signed)
IV removed, WNL. D/C instructions given to patient. Pt awaiting ride home.

## 2016-06-03 NOTE — Telephone Encounter (Signed)
APPT MADE AND LETTER SENT  °

## 2016-06-03 NOTE — Telephone Encounter (Signed)
PA has been approved with [employer] for entyvio from 06/02/16-06/02/17. Ref # [id number]

## 2016-06-03 NOTE — Discharge Summary (Signed)
Physician Discharge Summary  Joshua Robertson LFY:[id number] DOB: [birth year] DOA: 06/01/2016  PCP: [name], PA-C  Admit date: 06/01/2016 Discharge date: 06/03/2016  Admitted From: Home  Disposition: Home   Recommendations for Outpatient Follow-up:  1. Follow up with PCP in 1-2 weeks 2. Please obtain BMP/CBC in one week 3. Needs to follow up with Dr Nyoka Cowden at [facility] for Crohn's disease with known ICV stricture.     Discharge Condition: stable.  CODE STATUS: full code.  Diet recommendation: Heart Healthy   Brief/Interim Summary: Joshua Robertson a 40 y.o.malewith medical history significant of HTN and Crohn's disease presenting with refractory symptoms of Crohn's. Patient presents complaining of worsening abdominal pain, poor oral intake. CT scan: question thickening in sigmoid colon. WCB at 45.   Assessment & Plan:   1-Chroh ' s flare vs Intermittent partial SBO secondary to chronic terminal ileal /ICV stricture.  -History of ileal/ICV stricture (present on CT 02/2016), -Presents with abdominal pain, leukocytosis. Ct scan inflammation sigmoid colon, contrast has not reached colon at time of ct. -Previously failed Remicade and currently on Entyvio since August of this year.  -GI consulted and following. Recommend close follow up at [facility] for further care of ICV stricture.  Improved on IV solumedrol and IV antibiotics.  WBC trending down.  Tolerating diet. Feeling better. Abdominal pain improved.  will provide 3 more days of antibiotics to complete 5 days.  Prednisone taper as recommended by GI.   2-HTN; hold diuretics. SBP in the 110. Holder parameters for clonidine.   3-Marijuana dependence -Encourage cessation.   Discharge Diagnoses:  Principal Problem:   Crohn's colitis ([place]) Active Problems:   Essential hypertension   Marijuana abuse   Small bowel stricture   Malnutrition of moderate degree    Discharge Instructions  Discharge Instructions     Diet - low sodium heart healthy    Complete by:  As directed    Increase activity slowly    Complete by:  As directed      Allergies as of 06/03/2016      Reactions   Iodinated Diagnostic Agents Nausea And Vomiting   Azathioprine Nausea And Vomiting   Penicillins Swelling, Rash   Has patient had a PCN reaction causing immediate rash, facial/tongue/throat swelling, SOB or lightheadedness with hypotension: Yes Has patient had a PCN reaction causing severe rash involving mucus membranes or skin necrosis: Yes Has patient had a PCN reaction that required hospitalization No Has patient had a PCN reaction occurring within the last 10 years: No If all of the above answers are "NO", then may proceed with Cephalosporin use.   Pentasa [mesalamine Er] Rash      Medication List    STOP taking these medications   benazepril 40 MG tablet Commonly known as:  LOTENSIN   hydrochlorothiazide 12.5 MG capsule Commonly known as:  MICROZIDE     TAKE these medications   CARNATION INSTANT BREAKFAST PO Take 1 Can by mouth 3 (three) times daily.   cholecalciferol 1000 units tablet Commonly known as:  VITAMIN D Take 1 tablet (1,000 Units total) by mouth daily. BEGIN AFTER WEEKLY VIT D DOSING IS COMPLETE   ciprofloxacin 500 MG tablet Commonly known as:  CIPRO Take 1 tablet (500 mg total) by mouth 2 (two) times daily.   cloNIDine 0.1 MG tablet Commonly known as:  CATAPRES Take 1 tablet (0.1 mg total) by mouth 2 (two) times daily.   ENTYVIO 300 MG injection Generic drug:  vedolizumab Inject 300 mg into the vein  every 8 (eight) weeks.   metroNIDAZOLE 500 MG tablet Commonly known as:  FLAGYL Take 1 tablet (500 mg total) by mouth 3 (three) times daily.   predniSONE 10 MG (21) Tbpk tablet Commonly known as:  STERAPRED UNI-PAK 21 TAB Take 3 tablets for 5 days, then 2 tablets for 5 days then 1 tablet for 5 days then stop.       Allergies  Allergen Reactions  . Iodinated Diagnostic Agents  Nausea And Vomiting  . Azathioprine Nausea And Vomiting  . Penicillins Swelling and Rash    Has patient had a PCN reaction causing immediate rash, facial/tongue/throat swelling, SOB or lightheadedness with hypotension: Yes Has patient had a PCN reaction causing severe rash involving mucus membranes or skin necrosis: Yes Has patient had a PCN reaction that required hospitalization No Has patient had a PCN reaction occurring within the last 10 years: No If all of the above answers are "NO", then may proceed with Cephalosporin use.   Marland Kitchen Pentasa [Mesalamine Er] Rash    Consultations:  GI   Procedures/Studies: Ct Abdomen Pelvis W Contrast  Result Date: 06/01/2016 CLINICAL DATA:  Generalized abdominal pain over the last 2 weeks. Personal history of Crohn's disease. EXAM: CT ABDOMEN AND PELVIS WITH CONTRAST TECHNIQUE: Multidetector CT imaging of the abdomen and pelvis was performed using the standard protocol following bolus administration of intravenous contrast. CONTRAST:  140m ISOVUE-300 IOPAMIDOL (ISOVUE-300) INJECTION 61% COMPARISON:  03/13/2016 FINDINGS: The study suffers from severe motion degradation. Lower chest: Negative Hepatobiliary: There are multiple well-circumscribed subcentimeter low densities in the liver likely to represent small liver cysts. These are slightly progressive over time but not worrisome. No calcified gallstones. Pancreas: Normal Spleen: Normal Adrenals/Urinary Tract: Adrenal glands are normal. Kidneys are normal. Stomach/Bowel: No bowel pathology is seen, though there is extensive motion degradation. No dilated loops to suggest obstruction. Contrast has not yet reached the colon. One could question wall thickening in the sigmoid colon that could go along with colitis. Vascular/Lymphatic: Aortic atherosclerosis. No aneurysm. IVC is normal. Reproductive: Normal Other: Small to moderate amount of free fluid in the pelvis. Musculoskeletal: Chronic lower lumbar degenerative  changes. IMPRESSION: No evidence of obstruction on this motion degraded study. Question of wall thickening in the sigmoid colon that could go along with colitis. Moderate amount of free fluid in the pelvis. Electronically Signed   By: MNelson ChimesM.D.   On: 06/01/2016 15:33     Subjective: He denies abdominal pain. He has been tolerating current diet.   Discharge Exam: Vitals:   06/02/16 2154 06/03/16 0455  BP: 124/74 107/68  Pulse: 63 (!) 58  Resp: 18 18  Temp: 98.2 F (36.8 C) 97.5 F (36.4 C)   Vitals:   06/02/16 1323 06/02/16 2142 06/02/16 2154 06/03/16 0455  BP: (!) 98/59  124/74 107/68  Pulse: 68  63 (!) 58  Resp: 18  18 18   Temp: 98.1 F (36.7 C)  98.2 F (36.8 C) 97.5 F (36.4 C)  TempSrc: Oral  Oral Oral  SpO2: 100% 97% 99% 100%  Weight:      Height:        General: Pt is alert, awake, not in acute distress Cardiovascular: RRR, S1/S2 +, no rubs, no gallops Respiratory: CTA bilaterally, no wheezing, no rhonchi Abdominal: Soft, NT, ND, bowel sounds + Extremities: no edema, no cyanosis    The results of significant diagnostics from this hospitalization (including imaging, microbiology, ancillary and laboratory) are listed below for reference.  Microbiology: No results found for this or any previous visit (from the past 240 hour(s)).   Labs: BNP (last 3 results) No results for input(s): BNP in the last 8760 hours. Basic Metabolic Panel:  Recent Labs Lab 05/28/16 0943 06/01/16 1052 06/02/16 0405 06/03/16 0359  NA 140 136 136 138  K 4.0 3.5 4.3 4.3  CL 99 98* 96* 103  CO2 31 29 31 28   GLUCOSE 86 97 120* 145*  BUN 17 13 13 19   CREATININE 0.94 0.81 0.82 0.85  CALCIUM 10.0 9.3 9.4 9.2   Liver Function Tests:  Recent Labs Lab 05/28/16 0943 06/01/16 1052  AST 16 15  ALT 11 10*  ALKPHOS 77 71  BILITOT 0.5 0.4  PROT 8.0 7.8  ALBUMIN 4.2 3.6    Recent Labs Lab 05/28/16 0943 06/01/16 1104  LIPASE <5* 17   No results for input(s):  AMMONIA in the last 168 hours. CBC:  Recent Labs Lab 05/28/16 0943 06/01/16 1052 06/02/16 0405  WBC 11.7* 18.8* 13.3*  NEUTROABS 7,605 14.6*  --   HGB 10.1* 9.6* 9.4*  HCT 32.8* 29.9* 29.0*  MCV 72.7* 71.4* 71.1*  PLT 692* 569* 580*   Cardiac Enzymes: No results for input(s): CKTOTAL, CKMB, CKMBINDEX, TROPONINI in the last 168 hours. BNP: Invalid input(s): POCBNP CBG: No results for input(s): GLUCAP in the last 168 hours. D-Dimer No results for input(s): DDIMER in the last 72 hours. Hgb A1c No results for input(s): HGBA1C in the last 72 hours. Lipid Profile No results for input(s): CHOL, HDL, LDLCALC, TRIG, CHOLHDL, LDLDIRECT in the last 72 hours. Thyroid function studies No results for input(s): TSH, T4TOTAL, T3FREE, THYROIDAB in the last 72 hours.  Invalid input(s): FREET3 Anemia work up No results for input(s): VITAMINB12, FOLATE, FERRITIN, TIBC, IRON, RETICCTPCT in the last 72 hours. Urinalysis    Component Value Date/Time   COLORURINE YELLOW 05/03/2016 2144   APPEARANCEUR CLEAR 05/03/2016 2144   LABSPEC 1.025 05/03/2016 2144   PHURINE 6.0 05/03/2016 2144   GLUCOSEU NEGATIVE 05/03/2016 2144   HGBUR NEGATIVE 05/03/2016 2144   BILIRUBINUR SMALL (A) 05/03/2016 2144   KETONESUR 15 (A) 05/03/2016 2144   PROTEINUR TRACE (A) 05/03/2016 2144   UROBILINOGEN 0.2 11/26/2014 1430   NITRITE NEGATIVE 05/03/2016 2144   LEUKOCYTESUR TRACE (A) 05/03/2016 2144   Sepsis Labs Invalid input(s): PROCALCITONIN,  WBC,  LACTICIDVEN Microbiology No results found for this or any previous visit (from the past 240 hour(s)).   Time coordinating discharge: Over 30 minutes  SIGNED:   [name], MD  [facility] 06/03/2016, 12:55 PM Pager   If 7PM-7AM, please contact night-coverage [email or web address] Password [email or web address]

## 2016-06-03 NOTE — Telephone Encounter (Signed)
Please make patient an return OV with Dr. Eduard Roux at [facility] for: Crohn's disease with known ICV stricture, ongoing abdominal pain, weight loss, consider endoscopic dilation of stricture vs need for surgical evaluation for stricturolplasty.   Needs hospital follow up with [facility] in 3-4 weeks.

## 2016-06-03 NOTE — Telephone Encounter (Signed)
Spoke with Baxter Flattery at [place] at [phone]. Answered all her questions about the pts past medications and told her it would be done at [facility]. She is going to send it to the medical director and see if it can be approved. Informed her that his next infusion is on 06/08/16. She is going to see what she can do to get it approved.

## 2016-06-04 ENCOUNTER — Ambulatory Visit: Payer: BLUE CROSS/BLUE SHIELD | Admitting: Gastroenterology

## 2016-06-05 NOTE — Telephone Encounter (Signed)
REVIEWED. AGREE. NO ADDITIONAL RECOMMENDATIONS. 

## 2016-06-09 ENCOUNTER — Encounter (HOSPITAL_COMMUNITY)
Admission: RE | Admit: 2016-06-09 | Discharge: 2016-06-09 | Disposition: A | Discharge status: Home / Self Care | Payer: BLUE CROSS/BLUE SHIELD | Source: Ambulatory Visit | Attending: Gastroenterology | Admitting: Gastroenterology

## 2016-06-09 ENCOUNTER — Encounter (HOSPITAL_COMMUNITY): Payer: Self-pay

## 2016-06-09 DIAGNOSIS — K5090 Crohn's disease, unspecified, without complications: Secondary | ICD-10-CM | POA: Diagnosis not present

## 2016-06-09 MED ORDER — SODIUM CHLORIDE 0.9 % IV SOLN
Freq: Once | INTRAVENOUS | Status: AC
  Administered 2016-06-09: 250 mL via INTRAVENOUS

## 2016-06-09 MED ORDER — VEDOLIZUMAB 300 MG IV SOLR
300.0000 mg | INTRAVENOUS | Status: DC
  Administered 2016-06-09: 300 mg via INTRAVENOUS
  Filled 2016-06-09: qty 5

## 2016-06-11 ENCOUNTER — Encounter: Payer: Self-pay | Admitting: Gastroenterology

## 2016-06-16 ENCOUNTER — Telehealth: Payer: Self-pay | Admitting: Gastroenterology

## 2016-06-16 ENCOUNTER — Other Ambulatory Visit: Payer: Self-pay

## 2016-06-16 DIAGNOSIS — K50919 Crohn's disease, unspecified, with unspecified complications: Secondary | ICD-10-CM

## 2016-06-16 NOTE — Telephone Encounter (Signed)
Pt has an appointment with Dr. Nyoka Cowden on March 8th @ 11:45 am. Tried to call pt with no answer

## 2016-06-16 NOTE — Telephone Encounter (Signed)
Pt called this morning and said that SF told him if he hadn't heard from [facility] in a week to call us. He said he hasn't heard from them. He is using someone else's phone and said that he would call back at 11am.

## 2016-06-16 NOTE — Telephone Encounter (Signed)
Pt is aware.  

## 2016-07-01 ENCOUNTER — Emergency Department (HOSPITAL_COMMUNITY)
Admission: EM | Admit: 2016-07-01 | Discharge: 2016-07-01 | Disposition: A | Discharge status: Home / Self Care | Payer: BLUE CROSS/BLUE SHIELD | Attending: Emergency Medicine | Admitting: Emergency Medicine

## 2016-07-01 ENCOUNTER — Encounter (HOSPITAL_COMMUNITY): Payer: Self-pay | Admitting: *Deleted

## 2016-07-01 DIAGNOSIS — I10 Essential (primary) hypertension: Secondary | ICD-10-CM | POA: Insufficient documentation

## 2016-07-01 DIAGNOSIS — Z79899 Other long term (current) drug therapy: Secondary | ICD-10-CM | POA: Diagnosis not present

## 2016-07-01 DIAGNOSIS — M79602 Pain in left arm: Secondary | ICD-10-CM | POA: Insufficient documentation

## 2016-07-01 DIAGNOSIS — Z87891 Personal history of nicotine dependence: Secondary | ICD-10-CM | POA: Insufficient documentation

## 2016-07-01 DIAGNOSIS — M79601 Pain in right arm: Secondary | ICD-10-CM | POA: Diagnosis not present

## 2016-07-01 DIAGNOSIS — M79631 Pain in right forearm: Secondary | ICD-10-CM | POA: Diagnosis present

## 2016-07-01 MED ORDER — NAPROXEN 500 MG PO TABS: 500.0000 mg | ORAL_TABLET | Freq: Two times a day (BID) | ORAL | 0 refills | Status: DC

## 2016-07-01 MED ORDER — METHOCARBAMOL 500 MG PO TABS
1000.0000 mg | ORAL_TABLET | Freq: Once | ORAL | Status: AC
  Administered 2016-07-01: 1000 mg via ORAL
  Filled 2016-07-01: qty 2

## 2016-07-01 MED ORDER — METHOCARBAMOL 500 MG PO TABS: 500.0000 mg | ORAL_TABLET | Freq: Two times a day (BID) | ORAL | 0 refills | Status: DC

## 2016-07-01 MED ORDER — NAPROXEN 250 MG PO TABS
500.0000 mg | ORAL_TABLET | Freq: Once | ORAL | Status: AC
  Administered 2016-07-01: 500 mg via ORAL
  Filled 2016-07-01: qty 2

## 2016-07-01 NOTE — ED Provider Notes (Signed)
[facility] DEPT Provider Note   CSN: [id number] Arrival date & time: 07/01/16  1137     History   Chief Complaint Chief Complaint  Patient presents with  . Arm Pain    HPI Joshua Robertson is a 41 y.o. male.  HPI  41 y.o. male with a hx of Crohns Disease, HTN, presents to the Emergency Department today complaining of bilateral forearm pain x 4 days. No trauma to area. No N/V. No fevers. No CP/SOB/ABD pain. Attempted Tylenol with minimal relief. Pt states that it feels like an aching sensation that he rates 10/10. Notes pain with grip strength along forearm muscles. No numbness/tingling. No upper arm pain. No recent URI. No tick bites. No other symptoms noted.    Past Medical History:  Diagnosis Date  . BMI between 19-24,adult JAN 2010 149 LBS  . Crohn's disease ([place]) AUG 2009 NUR [place]   HB 9.4 FERRITIN 26  140 LBS; no serologies-pt can't afford test  . Helicobacter pylori gastritis 2009  . Hypertension   . Migraine headache    NONE SINCE AGE 22  . Sacroiliitis ([place]) 2009  . Spondyloarthropathy ([place]) 2009 EROSIVE    Patient Active Problem List   Diagnosis Date Noted  . Small bowel stricture 06/02/2016  . Malnutrition of moderate degree 06/02/2016  . Crohn's colitis ([place]) 06/01/2016  . Colitis 06/01/2016  . Marijuana abuse 06/01/2016  . Anemia 05/28/2016  . Pain of left great toe 04/02/2015  . Perianal abscess 07/11/2013  . Essential hypertension 10/22/2010  . WRIST PAIN, RIGHT 07/25/2009  . MIGRAINE HEADACHE 02/04/2009  . WEIGHT LOSS 02/04/2009  . Generalized abdominal pain 02/04/2009  . TINEA CORPORIS 11/22/2008  . Crohn's disease of both small and large intestine with intestinal obstruction ([place]) 08/15/2008    Past Surgical History:  Procedure Laterality Date  . COLONOSCOPY  03/2015   [facility]: Crohn's like stricture at ICV. Stricture was not traversable. abnormal mucosa of ICV. Bx, c/w Crohn's.   . COLONOSCOPY N/A 07/30/2013   stenotic IC VALVE.  UNABLE TO INTUBATE ILEUM.       Home Medications    Prior to Admission medications   Medication Sig Start Date End Date Taking? Authorizing Provider  cholecalciferol (VITAMIN D) 1000 units tablet Take 1 tablet (1,000 Units total) by mouth daily. BEGIN AFTER WEEKLY VIT D DOSING IS COMPLETE 04/11/16   [name], MD  ciprofloxacin (CIPRO) 500 MG tablet Take 1 tablet (500 mg total) by mouth 2 (two) times daily. 06/03/16   [name], MD  cloNIDine (CATAPRES) 0.1 MG tablet Take 1 tablet (0.1 mg total) by mouth 2 (two) times daily. 08/09/12   [name], MD  metroNIDAZOLE (FLAGYL) 500 MG tablet Take 1 tablet (500 mg total) by mouth 3 (three) times daily. 06/03/16   [name], MD  Nutritional Supplements (CARNATION INSTANT BREAKFAST PO) Take 1 Can by mouth 3 (three) times daily.    Historical Provider, MD  predniSONE (STERAPRED UNI-PAK 21 TAB) 10 MG (21) TBPK tablet Take 3 tablets for 5 days, then 2 tablets for 5 days then 1 tablet for 5 days then stop. 06/03/16   [name], MD  vedolizumab (ENTYVIO) 300 MG injection Inject 300 mg into the vein every 8 (eight) weeks.     Historical Provider, MD    Family History Family History  Problem Relation Age of Onset  . Hypertension Mother   . Hypertension Father   . Colon cancer Neg Hx   .  Colon polyps Neg Hx   . Inflammatory bowel disease Neg Hx     Social History Social History  Substance Use Topics  . Smoking status: Former Smoker    Packs/day: 0.50    Types: Cigarettes    Quit date: 03/02/2016  . Smokeless tobacco: Never Used  . Alcohol use 0.0 oz/week     Comment: very rare     Allergies   Iodinated diagnostic agents; Azathioprine; Penicillins; and Pentasa [mesalamine er]   Review of Systems Review of Systems  Constitutional: Negative for fever.  HENT: Negative for congestion, sinus pain and sore throat.   Respiratory: Negative for cough.   Cardiovascular: Negative for chest pain.    Gastrointestinal: Negative for abdominal pain, nausea and vomiting.  Musculoskeletal: Positive for myalgias. Negative for arthralgias.  Allergic/Immunologic: Negative for immunocompromised state.   Physical Exam Updated Vital Signs BP 131/86 (BP Location: Left Arm)   Pulse 82   Temp 98.4 F (36.9 C) (Oral)   Resp 20   Ht 5' 9"  (1.753 m)   Wt 66.2 kg   SpO2 100%   BMI 21.56 kg/m   Physical Exam  Constitutional: He is oriented to person, place, and time. Vital signs are normal. He appears well-developed and well-nourished.  HENT:  Head: Normocephalic.  Right Ear: Hearing normal.  Left Ear: Hearing normal.  Eyes: Conjunctivae and EOM are normal. Pupils are equal, round, and reactive to light.  Cardiovascular: Normal rate and regular rhythm.   Pulmonary/Chest: Effort normal.  Musculoskeletal:  Bilateral forearms NVI. Distal pulses appreciated. ROM intact. TTP along forearm musculature. Sensation intact. No palpable or visible deformities or irregularities.   Neurological: He is alert and oriented to person, place, and time.  Skin: Skin is warm and dry.  Psychiatric: He has a normal mood and affect. His speech is normal and behavior is normal. Thought content normal.   ED Treatments / Results  Labs (all labs ordered are listed, but only abnormal results are displayed) Labs Reviewed - No data to display  EKG  EKG Interpretation None       Radiology No results found.  Procedures Procedures (including critical care time)  Medications Ordered in ED Medications - No data to display   Initial Impression / Assessment and Plan / ED Course  I have reviewed the triage vital signs and the nursing notes.  Pertinent labs & imaging results that were available during my care of the patient were reviewed by me and considered in my medical decision making (see chart for details).    Final Clinical Impressions(s) / ED Diagnoses       {I obtained HPI from historian.   ED  Course:  Assessment: Pt is a 40yM with hx Crohns Disease who presents with bilateral forearm pain x 4 days. No trauma to area. No fevers. On exam, pt in NAD. Nontoxic/nonseptic appearing. VSS. Afebrile. Bilateral forearms NVI. Distal pulses appreciated. No palapble or visible deformities or irregularities. No swelling. Doubt fracture. Doubt infectious etiology. Appears to be myalgias from strain? No tick bites. Possible rheumatologic origin? But ROM joints intact without pain. Will RX muscle relaxants and anti inflammatories with follow up to PCP. Plan is to [place]. At time of discharge, Patient is in no acute distress. Vital Signs are stable. Patient is able to ambulate. Patient able to tolerate PO.   Disposition/Plan:  DC Home Additional Verbal discharge instructions given and discussed with patient.  Pt Instructed to f/u with PCP in the next week for evaluation  and treatment of symptoms. Return precautions given Pt acknowledges and agrees with plan  Supervising Physician Francine Graven, DO  Final diagnoses:  Bilateral arm pain    New Prescriptions New Prescriptions   No medications on file     [name], PA-C 07/01/16 Gamaliel, DO 07/04/16 1758

## 2016-07-01 NOTE — Discharge Instructions (Signed)
Please read and follow all provided instructions.  Your diagnoses today include:  1. Bilateral arm pain     Tests performed today include: Vital signs. See below for your results today.   Medications prescribed:  Take as prescribed   Home care instructions:  Follow any educational materials contained in this packet.  Follow-up instructions: Please follow-up with your primary care provider for further evaluation of symptoms and treatment   Return instructions:  Please return to the Emergency Department if you do not get better, if you get worse, or new symptoms OR  - Fever (temperature greater than 101.18F)  - Bleeding that does not stop with holding pressure to the area    -Severe pain (please note that you may be more sore the day after your accident)  - Chest Pain  - Difficulty breathing  - Severe nausea or vomiting  - Inability to tolerate food and liquids  - Passing out  - Skin becoming red around your wounds  - Change in mental status (confusion or lethargy)  - New numbness or weakness    Please return if you have any other emergent concerns.  Additional Information:  Your vital signs today were: BP 131/86 (BP Location: Left Arm)    Pulse 82    Temp 98.4 F (36.9 C) (Oral)    Resp 20    Ht 5' 9"  (1.753 m)    Wt 66.2 kg    SpO2 100%    BMI 21.56 kg/m  If your blood pressure (BP) was elevated above 135/85 this visit, please have this repeated by your doctor within one month. ---------------

## 2016-07-01 NOTE — ED Triage Notes (Signed)
Pt c/o bilateral arm pain that started 4 days ago. Pt report he is having a hard time extending his arms and squeezing his hands together. Denies injury.

## 2016-07-07 ENCOUNTER — Telehealth: Payer: Self-pay

## 2016-07-07 ENCOUNTER — Encounter: Payer: Self-pay | Admitting: Gastroenterology

## 2016-07-07 ENCOUNTER — Ambulatory Visit (INDEPENDENT_AMBULATORY_CARE_PROVIDER_SITE_OTHER): Payer: BLUE CROSS/BLUE SHIELD | Admitting: Gastroenterology

## 2016-07-07 ENCOUNTER — Other Ambulatory Visit: Payer: Self-pay

## 2016-07-07 DIAGNOSIS — K50812 Crohn's disease of both small and large intestine with intestinal obstruction: Secondary | ICD-10-CM

## 2016-07-07 DIAGNOSIS — M549 Dorsalgia, unspecified: Secondary | ICD-10-CM

## 2016-07-07 DIAGNOSIS — R29898 Other symptoms and signs involving the musculoskeletal system: Secondary | ICD-10-CM

## 2016-07-07 DIAGNOSIS — R634 Abnormal weight loss: Secondary | ICD-10-CM | POA: Diagnosis not present

## 2016-07-07 DIAGNOSIS — R109 Unspecified abdominal pain: Secondary | ICD-10-CM

## 2016-07-07 DIAGNOSIS — K50919 Crohn's disease, unspecified, with unspecified complications: Secondary | ICD-10-CM

## 2016-07-07 NOTE — Patient Instructions (Signed)
  COMPLETE MRI BRAIN TO ASSESS WHITE MATTER  CONTINUE CARNATION INSTANT BREAKFAST.  CT ENTEROGRAPHY TO ASSESS SMALL BOWEL. USE PHENERGAN TO PREVENT NAUSEA  COMPLETE SPINE SERIES.  CONTINUE ENYTVIO FOR NOW.  SE DR. Nyoka Cowden AT [facility].  FOLLOW UP IN 3 MOS.

## 2016-07-07 NOTE — Assessment & Plan Note (Signed)
STABLE.  CONTINUE TO MONITOR SYMPTOMS.

## 2016-07-07 NOTE — Progress Notes (Signed)
Subjective:    Patient ID: Joshua Robertson, male    DOB: 1975/12/18, 41 y.o.   MRN: [id number] MUSE,ROCHELLE D., PA-C  HPI C/O ABDOMINAL PAIN-EVERY DAY(DULL THROBBING). BMs: #4-7. YESTERDAY #6-7(2-3/DAY) #40-5(1 Q2-3 DAYS). NAUSEA: A LITTLE. FEELS TIRED ALL THE TIME. STILL SMOKES WEED. NO TOBACCO SINCE OCT 2017. DISABLITY HEARING PENDING-RSC DUE TO SNOW(JAN 18). WEIGHT MAR 2017: 139 LBS. TODAY: 137 LBS. PAIN IN BACK(LOWER)-STIFFNESS. MAY NOTICE KNUCKLES IN HANDS SWOLLEN AND CAN HURT AND PREVENTS HIM FROM MAKING A FIST. ALSO HAS WRIST PAIN.  STARTED ENYTVIO IN JUN 2017. In the past month trouble with pain and weakness in arms and hands. Occasionally has trouble with back of thighs and calves. FEELS LIKE HE HAS KNOTS ON HIS UPPER AND LOWER ARMS. HAS HAD TO BE HELPED OUT OF BED-THREE TIMES IN THE PAST TWO WEEKS. IN NUMBNESS, TINGLING, FALLING, OR CHANGE IN VISION.   PT DENIES FEVER, CHILLS, HEMATOCHEZIA, EYE PAIN, vomiting, melena, diarrhea, CHEST PAIN, SHORTNESS OF BREATH, CHANGE IN BOWEL IN HABITS, problems swallowing, OR heartburn or indigestion.  Past Medical History:  Diagnosis Date  . BMI between 19-24,adult JAN 2010 149 LBS  . Crohn's disease ([place]) AUG 2009 NUR [place]   HB 9.4 FERRITIN 26  140 LBS; no serologies-pt can't afford test  . Helicobacter pylori gastritis 2009  . Hypertension   . Migraine headache    NONE SINCE AGE 52  . Sacroiliitis ([place]) 2009  . Spondyloarthropathy ([place]) 2009 EROSIVE    Past Surgical History:  Procedure Laterality Date  . COLONOSCOPY  03/2015   [facility]duard Roux [facility]: Crohn's like stricture at ICV. Stricture was not traversable. abnormal mucosa of ICV. Bx, c/w Crohn's.   . COLONOSCOPY N/A 07/30/2013   stenotic IC VALVE. UNABLE TO INTUBATE ILEUM.   Allergies  Allergen Reactions  . Iodinated Diagnostic Agents Nausea And Vomiting  . Azathioprine Nausea And Vomiting  . Penicillins Swelling and Rash      . Pentasa [Mesalamine Er] Rash   Current Outpatient  Prescriptions  Medication Sig Dispense Refill  . cloNIDine (CATAPRES) 0.1 MG tablet Take 1 tablet (0.1 mg total) by mouth 2 (two) times daily.    . methocarbamol (ROBAXIN) 500 MG tablet Take 1 tablet (500 mg total) by mouth 2 (two) times daily.    . vedolizumab (ENTYVIO) 300 MG injection Inject 300 mg into the vein every 8 (eight) weeks.     . cholecalciferol (VITAMIN D) 1000 units tablet Take 1 tablet (1,000 Units total) by mouth daily.     .      .      .      . Nutritional Supplements (CARNATION INSTANT BREAKFAST PO) Take 1 Can by mouth 3 (three) times daily.    .       Review of Systems PER HPI OTHERWISE ALL SYSTEMS ARE NEGATIVE. I PERSONALLY REVIEWED THE CT DEC 2017 WITH DR. Lodema Pilot TO INTERPRET DUE TO NO TISSUE PLANES, NO BODY FAT. LIMITED EVALUATION OF SB. CT ENTEROGRAPHY WOULD BE BENEFICIAL.       Objective:   Physical Exam  Constitutional: He is oriented to person, place, and time. He appears well-developed and well-nourished. No distress.  HENT:  Head: Normocephalic and atraumatic.  Mouth/Throat: Oropharynx is clear and moist. No oropharyngeal exudate.  Eyes: Pupils are equal, round, and reactive to light. No scleral icterus.  Neck: Normal range of motion. Neck supple.  Cardiovascular: Normal rate, regular rhythm and normal heart sounds.   Pulmonary/Chest: Effort normal and breath sounds  normal. No respiratory distress.  Abdominal: Soft. Bowel sounds are normal. He exhibits no distension. There is tenderness. There is no rebound and no guarding.  MILD TO MODERATE IN THE EPIGASTRIUM & BUQS.   Musculoskeletal: He exhibits no edema.  Lymphadenopathy:    He has no cervical adenopathy.  Neurological: He is alert and oriented to person, place, and time.  Psychiatric: He has a normal mood and affect.  Vitals reviewed.    Assessment & Plan:

## 2016-07-07 NOTE — Progress Notes (Signed)
ON RECALL  °

## 2016-07-07 NOTE — Telephone Encounter (Signed)
Called pt back and he was informed of MRI, CT and back x-ray info. Pt is also aware that he has appt with Dr. Nyoka Cowden 08/19/16. Advised pt to call Dr. Rolly Salter office if he needs to be seen sooner.

## 2016-07-07 NOTE — Telephone Encounter (Signed)
Scheduled MRI Brain, CT enterography for 07/12/16. Pt to arrive at [facility] at 8:30 am. NPO after midnight. Called x-ray dept and was advised on how to put order in for Thoracolumbar spine series. X-ray to be done after MRI and CT is done. Tried to call pt, LMOVM and informed pt. Also mailed letter.  Patient already has upcoming appointment 08/19/16 with Dr. Nyoka Cowden at [facility].

## 2016-07-07 NOTE — Assessment & Plan Note (Addendum)
SYMPTOMS FAIRLY WELL CONTROLLED ON ENTYVIO. NOW WITH WEAKNESS IN ARMS AND HANDS-DOUBT PROGRESSIVE MULTIFOCAL LEUKOENCEPHALOPATHY AND BACK PAIN-POSSIBLE DUE TO SACROILEITIS.  I PERSONALLY DISCUSSED WITH DR BOLES-MRI BRAIN TO ASSESS WHITE MATTER I PERSONALLY REVIEWED THE CT DEC 2017 WITH DR. Thornton Papas. CT ENTEROGRAPHY TO ASSESS SMALL BOWEL. USE PHENERGAN TO PREVENT NAUSEA TLS SPINE SERIES CONTINUE ENYTVIO FOR NOW. SE DR. Nyoka Cowden AT [facility] SEE GEN SURGERY [place] FOLLOW UP IN 3 MOS.   GREATER THAN 50% WAS SPENT IN COUNSELING & COORDINATION OF CARE WITH THE PATIENT: DISCUSSED DIFFERENTIAL DIAGNOSIS, PROCEDURE, BENEFITS, RISKS, AND MANAGEMENT OF CROHN'S DISEASE. TOTAL ENCOUNTER TIME: 40 MINS.

## 2016-07-07 NOTE — Assessment & Plan Note (Addendum)
IN SETTING O BIOLOGIC AGENT. DIFFERENTIAL DIAGNOSIS INCLUDES MS OR PROGRESSIVE MULTIFOCAL LEUKOENCEPHALOPATHY  DISCUSS WITH DR BOLES-MRI BRAIN TO ASSESS WHITE MATTER CONTINUE ENYTVIO FOR NOW. FOLLOW UP IN 3 MOS.

## 2016-07-08 NOTE — Telephone Encounter (Signed)
Started prior authorization online yesterday on [employer] website for MRI Brain and CT enterography. Request is "In Progress". MRI and CT does not meet medical necessity. Advised to contact [employer] at [phone] if referring provider would like to discuss case with a physician reviewer.  Dr. Oneida Alar, can you please call to discuss case. Pt is scheduled for tests 07/12/16.

## 2016-07-08 NOTE — Telephone Encounter (Signed)
Dr. Oneida Alar, we do not have a case# (they do not give case number while case is pending). The pt's Subscriber ID# [id number].

## 2016-07-08 NOTE — Progress Notes (Signed)
CC'ED TO PCP 

## 2016-07-09 NOTE — Telephone Encounter (Signed)
Noted  

## 2016-07-09 NOTE — Telephone Encounter (Signed)
SPOKE WITH JOSELYN. CASE APPROVED: [id number]

## 2016-07-11 ENCOUNTER — Encounter (HOSPITAL_COMMUNITY): Payer: Self-pay

## 2016-07-12 ENCOUNTER — Ambulatory Visit (HOSPITAL_COMMUNITY)
Admission: RE | Admit: 2016-07-12 | Discharge: 2016-07-12 | Disposition: A | Discharge status: Home / Self Care | Payer: BLUE CROSS/BLUE SHIELD | Source: Ambulatory Visit | Attending: Gastroenterology | Admitting: Gastroenterology

## 2016-07-12 DIAGNOSIS — R109 Unspecified abdominal pain: Secondary | ICD-10-CM | POA: Insufficient documentation

## 2016-07-12 DIAGNOSIS — R591 Generalized enlarged lymph nodes: Secondary | ICD-10-CM | POA: Insufficient documentation

## 2016-07-12 DIAGNOSIS — R29898 Other symptoms and signs involving the musculoskeletal system: Secondary | ICD-10-CM | POA: Diagnosis not present

## 2016-07-12 DIAGNOSIS — R531 Weakness: Secondary | ICD-10-CM | POA: Diagnosis not present

## 2016-07-12 DIAGNOSIS — I700 Atherosclerosis of aorta: Secondary | ICD-10-CM | POA: Diagnosis not present

## 2016-07-12 DIAGNOSIS — R634 Abnormal weight loss: Secondary | ICD-10-CM | POA: Insufficient documentation

## 2016-07-12 DIAGNOSIS — K50919 Crohn's disease, unspecified, with unspecified complications: Secondary | ICD-10-CM | POA: Insufficient documentation

## 2016-07-12 MED ORDER — BARIUM SULFATE 0.1 % PO SUSP
ORAL | Status: AC
  Filled 2016-07-12: qty 3

## 2016-07-12 MED ORDER — IOPAMIDOL (ISOVUE-300) INJECTION 61%
125.0000 mL | Freq: Once | INTRAVENOUS | Status: AC | PRN
  Administered 2016-07-12: 125 mL via INTRAVENOUS

## 2016-07-12 MED ORDER — GADOBENATE DIMEGLUMINE 529 MG/ML IV SOLN
12.0000 mL | Freq: Once | INTRAVENOUS | Status: AC | PRN
  Administered 2016-07-12: 12 mL via INTRAVENOUS

## 2016-07-13 ENCOUNTER — Other Ambulatory Visit: Payer: Self-pay

## 2016-07-13 ENCOUNTER — Telehealth: Payer: Self-pay | Admitting: Gastroenterology

## 2016-07-13 DIAGNOSIS — R160 Hepatomegaly, not elsewhere classified: Secondary | ICD-10-CM

## 2016-07-13 NOTE — Telephone Encounter (Signed)
PLEASE CALL PT. HIS MRI OF THE BRAIN IS NORMAL. HIS CT SHOW INFLAMMATION IN HIS SMALL BOWEL AND CECUM. HE HAS INDETERMINATE LESIONS IN HIS LIVER AND NEEDS AN MRI OF THE LIVER HEPATIC PROTOCOL WITHIN THE NEXT 7 DAYS AND A HEPATIC FUNCTION PANEL DRAWN PRIOR TO HIS MRI OF THE LIVER. AFTER THE MRI OF THE LIVER HE NEEDS TO GET A COPY OF ALL HIS XRAYS FROM 2015 TO PRESENT TO TAKE WITH HIM TO HIS APPT AT [facility].

## 2016-07-13 NOTE — Telephone Encounter (Signed)
Pt is aware of results and he is also set up for MRI Liver on 07/15/16 @ 9:00. He is aware of that also.

## 2016-07-13 NOTE — Telephone Encounter (Signed)
PA# [id number]

## 2016-07-15 ENCOUNTER — Ambulatory Visit (HOSPITAL_COMMUNITY)
Admission: RE | Admit: 2016-07-15 | Discharge: 2016-07-15 | Disposition: A | Discharge status: Home / Self Care | Payer: BLUE CROSS/BLUE SHIELD | Source: Ambulatory Visit | Attending: Gastroenterology | Admitting: Gastroenterology

## 2016-07-15 DIAGNOSIS — R160 Hepatomegaly, not elsewhere classified: Secondary | ICD-10-CM | POA: Insufficient documentation

## 2016-07-15 DIAGNOSIS — K7689 Other specified diseases of liver: Secondary | ICD-10-CM | POA: Insufficient documentation

## 2016-07-15 MED ORDER — GADOXETATE DISODIUM 0.25 MMOL/ML IV SOLN
6.0000 mL | Freq: Once | INTRAVENOUS | Status: AC | PRN
  Administered 2016-07-15: 6 mL via INTRAVENOUS

## 2016-07-16 ENCOUNTER — Telehealth: Payer: Self-pay | Admitting: Gastroenterology

## 2016-07-16 NOTE — Telephone Encounter (Signed)
Reminder in epic °

## 2016-07-16 NOTE — Telephone Encounter (Signed)
Called and could not leave a message.

## 2016-07-16 NOTE — Telephone Encounter (Signed)
PLEASE CALL PT. HIS MRI SHOWS A NONSPECIFIC LESION IN THE R HEPATIC LOBE. IT DOES NOT LOOK LIKE CANCER. HE NEEDS A REPEAT MRI LIVER IN 6 MOS W/ EOVIST.

## 2016-07-17 ENCOUNTER — Encounter (HOSPITAL_COMMUNITY): Payer: Self-pay | Admitting: *Deleted

## 2016-07-17 ENCOUNTER — Emergency Department (HOSPITAL_COMMUNITY)
Admission: EM | Admit: 2016-07-17 | Discharge: 2016-07-18 | Disposition: A | Discharge status: Home / Self Care | Payer: BLUE CROSS/BLUE SHIELD | Attending: Emergency Medicine | Admitting: Emergency Medicine

## 2016-07-17 DIAGNOSIS — Z87891 Personal history of nicotine dependence: Secondary | ICD-10-CM | POA: Insufficient documentation

## 2016-07-17 DIAGNOSIS — I10 Essential (primary) hypertension: Secondary | ICD-10-CM | POA: Diagnosis not present

## 2016-07-17 DIAGNOSIS — R197 Diarrhea, unspecified: Secondary | ICD-10-CM | POA: Diagnosis present

## 2016-07-17 DIAGNOSIS — R109 Unspecified abdominal pain: Secondary | ICD-10-CM

## 2016-07-17 DIAGNOSIS — K5010 Crohn's disease of large intestine without complications: Secondary | ICD-10-CM

## 2016-07-17 DIAGNOSIS — Z79899 Other long term (current) drug therapy: Secondary | ICD-10-CM | POA: Insufficient documentation

## 2016-07-17 LAB — COMPREHENSIVE METABOLIC PANEL
ALBUMIN: 3.2 g/dL — AB (ref 3.5–5.0)
ALT: 9 U/L — ABNORMAL LOW (ref 17–63)
ANION GAP: 5 (ref 5–15)
AST: 13 U/L — AB (ref 15–41)
Alkaline Phosphatase: 67 U/L (ref 38–126)
BILIRUBIN TOTAL: 0.3 mg/dL (ref 0.3–1.2)
BUN: 8 mg/dL (ref 6–20)
CO2: 29 mmol/L (ref 22–32)
Calcium: 8.7 mg/dL — ABNORMAL LOW (ref 8.9–10.3)
Chloride: 102 mmol/L (ref 101–111)
Creatinine, Ser: 0.77 mg/dL (ref 0.61–1.24)
GFR calc Af Amer: >60 mL/min (ref >60)
GFR calc non Af Amer: >60 mL/min (ref >60)
GLUCOSE: 94 mg/dL (ref 65–99)
POTASSIUM: 3.7 mmol/L (ref 3.5–5.1)
SODIUM: 136 mmol/L (ref 135–145)
TOTAL PROTEIN: 6.9 g/dL (ref 6.5–8.1)

## 2016-07-17 LAB — CBC WITH DIFFERENTIAL/PLATELET
BASOS ABS: 0.1 10*3/uL (ref 0.0–0.1)
BASOS PCT: 1 %
EOS ABS: 0.9 10*3/uL — AB (ref 0.0–0.7)
Eosinophils Relative: 6 %
HCT: 29.6 % — ABNORMAL LOW (ref 39.0–52.0)
HEMOGLOBIN: 9.8 g/dL — AB (ref 13.0–17.0)
Lymphocytes Relative: 18 %
Lymphs Abs: 2.5 10*3/uL (ref 0.7–4.0)
MCH: 22.8 pg — ABNORMAL LOW (ref 26.0–34.0)
MCHC: 33.1 g/dL (ref 30.0–36.0)
MCV: 68.8 fL — ABNORMAL LOW (ref 78.0–100.0)
MONO ABS: 1.1 10*3/uL — AB (ref 0.1–1.0)
Monocytes Relative: 8 %
NEUTROS ABS: 9 10*3/uL — AB (ref 1.7–7.7)
NEUTROS PCT: 67 %
Platelets: 523 10*3/uL — ABNORMAL HIGH (ref 150–400)
RBC: 4.3 MIL/uL (ref 4.22–5.81)
RDW: 20.9 % — AB (ref 11.5–15.5)
WBC: 13.5 10*3/uL — ABNORMAL HIGH (ref 4.0–10.5)

## 2016-07-17 LAB — SEDIMENTATION RATE: SED RATE: 38 mm/h — AB (ref 0–16)

## 2016-07-17 LAB — LIPASE, BLOOD: Lipase: 12 U/L (ref 11–51)

## 2016-07-17 LAB — CK: Total CK: 67 U/L (ref 49–397)

## 2016-07-17 MED ORDER — HYDROMORPHONE HCL 1 MG/ML IJ SOLN
1.0000 mg | Freq: Once | INTRAMUSCULAR | Status: AC
  Administered 2016-07-17: 1 mg via INTRAVENOUS
  Filled 2016-07-17: qty 1

## 2016-07-17 MED ORDER — METHYLPREDNISOLONE SODIUM SUCC 125 MG IJ SOLR
125.0000 mg | Freq: Once | INTRAMUSCULAR | Status: AC
  Administered 2016-07-18: 125 mg via INTRAVENOUS
  Filled 2016-07-17: qty 2

## 2016-07-17 MED ORDER — SODIUM CHLORIDE 0.9 % IV BOLUS (SEPSIS)
1000.0000 mL | Freq: Once | INTRAVENOUS | Status: AC
  Administered 2016-07-17: 1000 mL via INTRAVENOUS

## 2016-07-17 NOTE — ED Triage Notes (Signed)
Pt reports abdominal pain x 1 week, bilateral arm and leg pain since Monday but worse today.

## 2016-07-17 NOTE — ED Provider Notes (Signed)
[facility] DEPT Provider Note   CSN: [id number] Arrival date & time: 07/17/16  2034 By signing my name below, I, Joshua Robertson, attest that this documentation has been prepared under the direction and in the presence of [name], MD . Electronically Signed: Dyke Robertson, Scribe. 07/17/2016. 9:43 PM.   History   Chief Complaint Chief Complaint  Patient presents with  . Abdominal Pain    HPI Joshua Robertson is a 41 y.o. male with a history of Crohn's disease c/b small bowel strictures and HTN who presents to the Emergency Department complaining of constant, progressively worsening, 7/10 abdominal pain onset one week ago. Pt notes associated generalized weakness, diarrhea 2x yesterday that was non-bloody, bilateral arm and leg pain/soreness, and generalized body aches. Pt states he had to be helped out of bed this morning and his girlfriend had to dress him. He was seen at his GI on 07/07/16 for the same and was scheduled to have a MRI and CT. Per chart review, MRI showed a nonspecific lesion in the R hepatic lobe. He has been on Enytvio since 08/17 for management of Crohn's disease. He denies any nausea, vomiting, fever, melena, hematochezia, urinary symptoms, cough, congestion, sore throat, or any other associated symptoms.  GI: Dr. Oneida Alar  The history is provided by the patient and medical records. No language interpreter was used.   Past Medical History:  Diagnosis Date  . BMI between 19-24,adult JAN 2010 149 LBS  . Crohn's disease ([place]) AUG 2009 NUR [place]   HB 9.4 FERRITIN 26  140 LBS; no serologies-pt can't afford test  . Helicobacter pylori gastritis 2009  . Hypertension   . Migraine headache    NONE SINCE AGE 7  . Sacroiliitis ([place]) 2009  . Spondyloarthropathy ([place]) 2009 EROSIVE    Patient Active Problem List   Diagnosis Date Noted  . Weakness of both arms 07/07/2016  . Small bowel stricture 06/02/2016  . Malnutrition of moderate degree 06/02/2016  . Colitis 06/01/2016    . Marijuana abuse 06/01/2016  . Anemia 05/28/2016  . Pain of left great toe 04/02/2015  . Perianal abscess 07/11/2013  . Essential hypertension 10/22/2010  . WRIST PAIN, RIGHT 07/25/2009  . MIGRAINE HEADACHE 02/04/2009  . WEIGHT LOSS 02/04/2009  . Generalized abdominal pain 02/04/2009  . TINEA CORPORIS 11/22/2008  . Crohn's disease of both small and large intestine with intestinal obstruction ([place]) 08/15/2008    Past Surgical History:  Procedure Laterality Date  . COLONOSCOPY  03/2015   [facility]: Crohn's like stricture at ICV. Stricture was not traversable. abnormal mucosa of ICV. Bx, c/w Crohn's.   . COLONOSCOPY N/A 07/30/2013   stenotic IC VALVE. UNABLE TO INTUBATE ILEUM.     Home Medications    Prior to Admission medications   Medication Sig Start Date End Date Taking? Authorizing Provider  cholecalciferol (VITAMIN D) 1000 units tablet Take 1 tablet (1,000 Units total) by mouth daily. BEGIN AFTER WEEKLY VIT D DOSING IS COMPLETE 04/11/16  Yes [name], MD  cloNIDine (CATAPRES) 0.1 MG tablet Take 1 tablet (0.1 mg total) by mouth 2 (two) times daily. 08/09/12  Yes [name], MD  methocarbamol (ROBAXIN) 500 MG tablet Take 1 tablet (500 mg total) by mouth 2 (two) times daily. 07/01/16  Yes [name], PA-C  vedolizumab (ENTYVIO) 300 MG injection Inject 300 mg into the vein every 8 (eight) weeks.    Yes Historical Provider, MD  ciprofloxacin (CIPRO) 500 MG tablet Take 1 tablet (500 mg  total) by mouth 2 (two) times daily. Patient not taking: Reported on 07/07/2016 06/03/16   [name], MD  metroNIDAZOLE (FLAGYL) 500 MG tablet Take 1 tablet (500 mg total) by mouth 3 (three) times daily. Patient not taking: Reported on 07/07/2016 06/03/16   [name], MD  naproxen (NAPROSYN) 500 MG tablet Take 1 tablet (500 mg total) by mouth 2 (two) times daily. Patient not taking: Reported on 07/07/2016 07/01/16   [name], PA-C  predniSONE (STERAPRED UNI-PAK 21 TAB)  10 MG (21) TBPK tablet Take 3 tablets for 5 days, then 2 tablets for 5 days then 1 tablet for 5 days then stop. Patient not taking: Reported on 07/07/2016 06/03/16   [name], MD    Family History Family History  Problem Relation Age of Onset  . Hypertension Mother   . Hypertension Father   . Colon cancer Neg Hx   . Colon polyps Neg Hx   . Inflammatory bowel disease Neg Hx     Social History Social History  Substance Use Topics  . Smoking status: Former Smoker    Packs/day: 0.50    Types: Cigarettes    Quit date: 03/02/2016  . Smokeless tobacco: Never Used  . Alcohol use 0.0 oz/week     Comment: very rare    Allergies   Iodinated diagnostic agents; Azathioprine; Penicillins; and Pentasa [mesalamine er]   Review of Systems Review of Systems 10 systems reviewed and all are negative for acute change except as noted in the HPI.  Physical Exam Updated Vital Signs BP 141/98 (BP Location: Left Arm)   Pulse 88   Temp 98.6 F (37 C) (Oral)   Resp 20   Ht 5' 9.5" (1.765 m)   Wt 137 lb (62.1 kg)   SpO2 98%   BMI 19.94 kg/m   Physical Exam Physical Exam  Nursing note and vitals reviewed. Constitutional: non-toxic, and in no acute distress Head: Normocephalic and atraumatic.  Mouth/Throat: Oropharynx is clear and moist.  Neck: Normal range of motion. Neck supple.  Cardiovascular: Normal rate and regular rhythm.   Pulmonary/Chest: Effort normal and breath sounds normal.  Abdominal: Soft. No distention. Tenderness in the left hemi abdomen and upper abdomen. There is no rebound and no guarding.  Musculoskeletal: Normal range of motion.  Neurological: Alert, no facial droop, fluent speech, moves all extremities symmetrically Skin: Skin is warm and dry.  Psychiatric: Cooperative   ED Treatments / Results  DIAGNOSTIC STUDIES:  Oxygen Saturation is 100% on RA, normal by my interpretation.    COORDINATION OF CARE:  9:42 PM Will order CK, urinalysis, CBC,  C-reactive protein, lipase, CMP, and sedimentation rate. Discussed treatment plan which includes dilaudid with pt at bedside and pt agreed to plan.   Labs (all labs ordered are listed, but only abnormal results are displayed) Labs Reviewed  CBC WITH DIFFERENTIAL/PLATELET - Abnormal; Notable for the following:       Result Value   WBC 13.5 (*)    Hemoglobin 9.8 (*)    HCT 29.6 (*)    MCV 68.8 (*)    MCH 22.8 (*)    RDW 20.9 (*)    Platelets 523 (*)    Neutro Abs 9.0 (*)    Monocytes Absolute 1.1 (*)    Eosinophils Absolute 0.9 (*)    All other components within normal limits  COMPREHENSIVE METABOLIC PANEL - Abnormal; Notable for the following:    Calcium 8.7 (*)    Albumin 3.2 (*)  AST 13 (*)    ALT 9 (*)    All other components within normal limits  SEDIMENTATION RATE - Abnormal; Notable for the following:    Sed Rate 38 (*)    All other components within normal limits  LIPASE, BLOOD  CK  URINALYSIS, ROUTINE W REFLEX MICROSCOPIC  C-REACTIVE PROTEIN    EKG  EKG Interpretation None       Radiology No results found.  Procedures Procedures (including critical care time)  Medications Ordered in ED Medications  methylPREDNISolone sodium succinate (SOLU-MEDROL) 125 mg/2 mL injection 125 mg (not administered)  HYDROmorphone (DILAUDID) injection 1 mg (not administered)  sodium chloride 0.9 % bolus 1,000 mL (1,000 mLs Intravenous New Bag/Given 07/17/16 2148)  HYDROmorphone (DILAUDID) injection 1 mg (1 mg Intravenous Given 07/17/16 2147)  HYDROmorphone (DILAUDID) injection 1 mg (1 mg Intravenous Given 07/17/16 2321)     Initial Impression / Assessment and Plan / ED Course  I have reviewed the triage vital signs and the nursing notes.  Pertinent labs & imaging results that were available during my care of the patient were reviewed by me and considered in my medical decision making (see chart for details).     41 year old male with history of stricturing Crohn's disease  who presents with abdominal pain, which she states is consistent with prior Crohn exacerbation. He is nontoxic in no acute distress with stable vital signs. His abdomen is soft, nondistended, with some left-sided upper abdominal pain. He does not have a surgical abdomen. He does not have symptoms that would be concerning for obstructive process or severe stricture at this time. Has had multiple CT scans recently including 07/12/16 CT enterography showing active crohn's disease. I do not feel he requires repeat imaging given his symptoms the same. Discussed with patient, and he was okay with managing symptoms as Crohn's flare rather than image at this time.    Blood work does show mild leukocytosis of 13, which is close to his baseline. He also has thrombocytosis which she has had before in the past and elevated inflammatory markers. Discussed with Dr. Laural Golden. Concerned that he may need additional immune modifying medication or new medication as he has been having a lot of flares on just Enytvio. Recommended that he see Dr. Oneida Alar about this. In the interim, recommending prednisone taper for him and outpatient follow-up.   Pain better controlled in ED after analgesics, steroids and IVF. Felt to be stable for discharge. Strict return and follow-up instructions reviewed. He expressed understanding of all discharge instructions and felt comfortable with the plan of care.   Final Clinical Impressions(s) / ED Diagnoses   Final diagnoses:  Crohn's disease of colon without complication ([place])    New Prescriptions New Prescriptions   No medications on file    I personally performed the services described in this documentation, which was scribed in my presence. The recorded information has been reviewed and is accurate.    [name], MD 07/18/16 [phone]

## 2016-07-18 LAB — C-REACTIVE PROTEIN: CRP: 12.9 mg/dL — ABNORMAL HIGH (ref <1.0)

## 2016-07-18 MED ORDER — HYDROMORPHONE HCL 1 MG/ML IJ SOLN
1.0000 mg | Freq: Once | INTRAMUSCULAR | Status: AC
  Administered 2016-07-18: 1 mg via INTRAVENOUS
  Filled 2016-07-18: qty 1

## 2016-07-18 MED ORDER — PREDNISONE 5 MG PO TABS: 30.0000 mg | ORAL_TABLET | ORAL | 0 refills | Status: DC

## 2016-07-18 NOTE — Discharge Instructions (Signed)
Our GI doctor recommended that you follow-up closely with Dr. Oneida Alar regarding treatment of your Crohn's.  Please take steroids as prescribed for Crohn's flare.  Return for worsening symptoms, including fever, intractable vomiting, escalating pain, or any other symptoms concerning to you.

## 2016-07-19 NOTE — Telephone Encounter (Signed)
PT is aware.

## 2016-08-04 ENCOUNTER — Encounter (HOSPITAL_COMMUNITY)
Admission: RE | Admit: 2016-08-04 | Discharge: 2016-08-04 | Disposition: A | Discharge status: Home / Self Care | Payer: BLUE CROSS/BLUE SHIELD | Source: Ambulatory Visit | Attending: Gastroenterology | Admitting: Gastroenterology

## 2016-08-04 NOTE — Progress Notes (Signed)
Patient was scheduled for an Entyvio infusion today, no show. Multiple attempts made to call pt and family members were unsuccessful. Unable to leave message on anyone's phone.

## 2016-08-11 ENCOUNTER — Encounter: Payer: Self-pay | Admitting: Gastroenterology

## 2016-08-31 ENCOUNTER — Telehealth: Payer: Self-pay

## 2016-08-31 NOTE — Telephone Encounter (Signed)
I spoke with pt's mom in reference to her problem. She wanted me to let Dr. Oneida Alar know that pt is waiting on getting prednisone out of his system to have surgery.  But at this time, he is doubled over with pain all over.  She said she knew that there is nothing that can be done til he has surgery, but she wanted to make Dr. Oneida Alar aware.

## 2016-09-01 ENCOUNTER — Emergency Department (HOSPITAL_COMMUNITY)
Admission: EM | Admit: 2016-09-01 | Discharge: 2016-09-01 | Disposition: A | Discharge status: Home / Self Care | Payer: BLUE CROSS/BLUE SHIELD | Attending: Emergency Medicine | Admitting: Emergency Medicine

## 2016-09-01 ENCOUNTER — Encounter (HOSPITAL_COMMUNITY): Payer: Self-pay | Admitting: *Deleted

## 2016-09-01 DIAGNOSIS — Z79899 Other long term (current) drug therapy: Secondary | ICD-10-CM | POA: Diagnosis not present

## 2016-09-01 DIAGNOSIS — K5090 Crohn's disease, unspecified, without complications: Secondary | ICD-10-CM | POA: Insufficient documentation

## 2016-09-01 DIAGNOSIS — R1032 Left lower quadrant pain: Secondary | ICD-10-CM

## 2016-09-01 DIAGNOSIS — I10 Essential (primary) hypertension: Secondary | ICD-10-CM | POA: Insufficient documentation

## 2016-09-01 DIAGNOSIS — Z87891 Personal history of nicotine dependence: Secondary | ICD-10-CM | POA: Diagnosis not present

## 2016-09-01 LAB — COMPREHENSIVE METABOLIC PANEL
ALT: 10 U/L — AB (ref 17–63)
AST: 20 U/L (ref 15–41)
Albumin: 3.7 g/dL (ref 3.5–5.0)
Alkaline Phosphatase: 64 U/L (ref 38–126)
Anion gap: 7 (ref 5–15)
BUN: 11 mg/dL (ref 6–20)
CHLORIDE: 98 mmol/L — AB (ref 101–111)
CO2: 29 mmol/L (ref 22–32)
CREATININE: 0.82 mg/dL (ref 0.61–1.24)
Calcium: 9.1 mg/dL (ref 8.9–10.3)
Glucose, Bld: 127 mg/dL — ABNORMAL HIGH (ref 65–99)
POTASSIUM: 3.6 mmol/L (ref 3.5–5.1)
Sodium: 134 mmol/L — ABNORMAL LOW (ref 135–145)
Total Bilirubin: 0.6 mg/dL (ref 0.3–1.2)
Total Protein: 7.9 g/dL (ref 6.5–8.1)

## 2016-09-01 LAB — CBC
HEMATOCRIT: 34.2 % — AB (ref 39.0–52.0)
HEMOGLOBIN: 11.1 g/dL — AB (ref 13.0–17.0)
MCH: 22.2 pg — AB (ref 26.0–34.0)
MCHC: 32.5 g/dL (ref 30.0–36.0)
MCV: 68.3 fL — AB (ref 78.0–100.0)
Platelets: 422 10*3/uL — ABNORMAL HIGH (ref 150–400)
RBC: 5.01 MIL/uL (ref 4.22–5.81)
RDW: 22 % — ABNORMAL HIGH (ref 11.5–15.5)
WBC: 13.5 10*3/uL — AB (ref 4.0–10.5)

## 2016-09-01 LAB — URINALYSIS, ROUTINE W REFLEX MICROSCOPIC
BILIRUBIN URINE: NEGATIVE
Glucose, UA: NEGATIVE mg/dL
HGB URINE DIPSTICK: NEGATIVE
Ketones, ur: 5 mg/dL — AB
Leukocytes, UA: NEGATIVE
Nitrite: NEGATIVE
Protein, ur: NEGATIVE mg/dL
SPECIFIC GRAVITY, URINE: 1.019 (ref 1.005–1.030)
pH: 6 (ref 5.0–8.0)

## 2016-09-01 LAB — LIPASE, BLOOD: LIPASE: 13 U/L (ref 11–51)

## 2016-09-01 MED ORDER — METHYLPREDNISOLONE SODIUM SUCC 125 MG IJ SOLR
125.0000 mg | Freq: Once | INTRAMUSCULAR | Status: AC
  Administered 2016-09-01: 125 mg via INTRAVENOUS
  Filled 2016-09-01: qty 2

## 2016-09-01 MED ORDER — HYDROMORPHONE HCL 1 MG/ML IJ SOLN
1.0000 mg | Freq: Once | INTRAMUSCULAR | Status: AC
  Administered 2016-09-01: 1 mg via INTRAVENOUS
  Filled 2016-09-01: qty 1

## 2016-09-01 MED ORDER — SODIUM CHLORIDE 0.9 % IV BOLUS (SEPSIS)
1000.0000 mL | Freq: Once | INTRAVENOUS | Status: AC
  Administered 2016-09-01: 1000 mL via INTRAVENOUS

## 2016-09-01 NOTE — ED Provider Notes (Signed)
  Face-to-face evaluation   History: The patient presents for evaluation of abdominal pain with nausea and vomiting.  He states the pain radiates to his toes bilaterally.  Recently on prednisone burst with taper, for exacerbation of persistent terminal ileitis, suspected to be secondary to Crohn's disease.  Physical exam: Alert, cooperative.  Abdomen soft nontender without guarding or focal tenderness.  Medical screening examination/treatment/procedure(s) were conducted as a shared visit with non-physician practitioner(s) and myself.  I personally evaluated the patient during the encounter   Daleen Bo, MD 09/02/16 [phone]

## 2016-09-01 NOTE — ED Notes (Signed)
ED Provider at bedside. 

## 2016-09-01 NOTE — ED Triage Notes (Signed)
Pt comes in with abdominal pain accompanied by n/v. Pt states his legs are hurting as well. Pt has hx of Crohn's and states this feels the same.

## 2016-09-01 NOTE — Discharge Instructions (Signed)
Call Dr. Oneida Alar office to arrange a follow-up appt; Return to ER for any worsening symptoms such as increasing abdominal pain, fever, or bloody diarrhea

## 2016-09-01 NOTE — ED Provider Notes (Signed)
[facility] DEPT Provider Note   CSN: [id number] Arrival date & time: 09/01/16  1051     History   Chief Complaint Chief Complaint  Patient presents with  . Abdominal Pain    HPI Joshua Robertson is a 41 y.o. male.  HPI  Joshua Robertson is a 41 y.o. male with hx of Crohn's disease who presents to the Emergency Department complaining of left lower abdominal pain and pain to his bilateral lower extremities.  Symptoms began several days ago.  He was seen here last month for same and prescribed a steroid taper which he states improved his symptoms temporarily.  Completed the taper two days ago and pain now worsening. He describes one episode of loose stools today without blood or associated vomiting.  He is seen by local GI, Dr. Oneida Alar, and currently taking Entyvio infusions every 8 weeks which he states is not controlling his symptoms.  He describes weakness of both lower legs since beginning the medication.  He denies chest pain, shortness of breath, vomiting, fever, chills, bloody stools.  Past Medical History:  Diagnosis Date  . BMI between 19-24,adult JAN 2010 149 LBS  . Crohn's disease ([place]) AUG 2009 NUR [place]   HB 9.4 FERRITIN 26  140 LBS; no serologies-pt can't afford test  . Helicobacter pylori gastritis 2009  . Hypertension   . Migraine headache    NONE SINCE AGE 33  . Sacroiliitis ([place]) 2009  . Spondyloarthropathy ([place]) 2009 EROSIVE    Patient Active Problem List   Diagnosis Date Noted  . Weakness of both arms 07/07/2016  . Small bowel stricture 06/02/2016  . Malnutrition of moderate degree 06/02/2016  . Colitis 06/01/2016  . Marijuana abuse 06/01/2016  . Anemia 05/28/2016  . Pain of left great toe 04/02/2015  . Perianal abscess 07/11/2013  . Essential hypertension 10/22/2010  . WRIST PAIN, RIGHT 07/25/2009  . MIGRAINE HEADACHE 02/04/2009  . WEIGHT LOSS 02/04/2009  . Generalized abdominal pain 02/04/2009  . TINEA CORPORIS 11/22/2008  . Crohn's disease of both small  and large intestine with intestinal obstruction ([place]) 08/15/2008    Past Surgical History:  Procedure Laterality Date  . COLONOSCOPY  03/2015   [facility]: Crohn's like stricture at ICV. Stricture was not traversable. abnormal mucosa of ICV. Bx, c/w Crohn's.   . COLONOSCOPY N/A 07/30/2013   stenotic IC VALVE. UNABLE TO INTUBATE ILEUM.       Home Medications    Prior to Admission medications   Medication Sig Start Date End Date Taking? Authorizing Provider  cholecalciferol (VITAMIN D) 1000 units tablet Take 1 tablet (1,000 Units total) by mouth daily. BEGIN AFTER WEEKLY VIT D DOSING IS COMPLETE 04/11/16  Yes [name], MD  cloNIDine (CATAPRES) 0.1 MG tablet Take 1 tablet (0.1 mg total) by mouth 2 (two) times daily. 08/09/12  Yes [name], MD  vedolizumab (ENTYVIO) 300 MG injection Inject 300 mg into the vein every 8 (eight) weeks.    Yes Historical Provider, MD  ciprofloxacin (CIPRO) 500 MG tablet Take 1 tablet (500 mg total) by mouth 2 (two) times daily. Patient not taking: Reported on 07/07/2016 06/03/16   [name], MD  methocarbamol (ROBAXIN) 500 MG tablet Take 1 tablet (500 mg total) by mouth 2 (two) times daily. 07/01/16   [name], PA-C  metroNIDAZOLE (FLAGYL) 500 MG tablet Take 1 tablet (500 mg total) by mouth 3 (three) times daily. Patient not taking: Reported on 07/07/2016 06/03/16   [name], MD  naproxen (NAPROSYN) 500 MG tablet Take 1 tablet (500 mg total) by mouth 2 (two) times daily. Patient not taking: Reported on 07/07/2016 07/01/16   [name], PA-C  predniSONE (DELTASONE) 5 MG tablet Take 6 tablets (30 mg total) by mouth as directed. Take 6 tablets (30 mg) daily for 7 days, then take 5 tablets (25 mg) daily for 7 days, then take 4 tablets (20 mg) daily for 7 days, then take 3 tablets (15 mg) daily for 7 days, then take 2 tablets (10 mg) daily for 7 days, then take 1 tablet (5 mg) daily for 7 days 07/18/16   [name], MD    Family  History Family History  Problem Relation Age of Onset  . Hypertension Mother   . Hypertension Father   . Colon cancer Neg Hx   . Colon polyps Neg Hx   . Inflammatory bowel disease Neg Hx     Social History Social History  Substance Use Topics  . Smoking status: Former Smoker    Packs/day: 0.50    Types: Cigarettes    Quit date: 03/02/2016  . Smokeless tobacco: Never Used  . Alcohol use 0.0 oz/week     Comment: very rare     Allergies   Iodinated diagnostic agents; Azathioprine; Penicillins; and Pentasa [mesalamine er]   Review of Systems Review of Systems  Constitutional: Negative for appetite change, chills and fever.  Respiratory: Negative for chest tightness and shortness of breath.   Cardiovascular: Negative for chest pain.  Gastrointestinal: Positive for abdominal pain and diarrhea. Negative for blood in stool, nausea and vomiting.  Genitourinary: Negative for decreased urine volume, difficulty urinating, dysuria and flank pain.  Musculoskeletal: Positive for arthralgias (lower extremity pain). Negative for back pain and joint swelling.  Skin: Negative for color change and rash.  Neurological: Negative for dizziness, weakness and numbness.  Hematological: Negative for adenopathy.  All other systems reviewed and are negative.    Physical Exam Updated Vital Signs BP (!) 163/105   Pulse 85   Temp 99.9 F (37.7 C) (Temporal)   Resp 18   Ht 5' 9"  (1.753 m)   Wt 61.2 kg   SpO2 97%   BMI 19.94 kg/m   Physical Exam  Constitutional: He is oriented to person, place, and time. He appears well-nourished. No distress.  HENT:  Mouth/Throat: Oropharynx is clear and moist.  Eyes: Conjunctivae are normal. Pupils are equal, round, and reactive to light.  Cardiovascular: Normal rate, regular rhythm and intact distal pulses.   No murmur heard. Pulmonary/Chest: Effort normal. No respiratory distress. He exhibits no tenderness.  Abdominal: Soft. He exhibits no distension  and no mass. There is no rebound and no guarding.  Mild ttp of the upper and mid left abdomen w/o guarding or rebound tenderness.   Musculoskeletal: Normal range of motion. He exhibits no edema.  Neurological: He is alert and oriented to person, place, and time. No sensory deficit. He exhibits normal muscle tone.  Skin: Skin is warm. No rash noted.  Nursing note and vitals reviewed.    ED Treatments / Results  Labs (all labs ordered are listed, but only abnormal results are displayed) Labs Reviewed  COMPREHENSIVE METABOLIC PANEL - Abnormal; Notable for the following:       Result Value   Sodium 134 (*)    Chloride 98 (*)    Glucose, Bld 127 (*)    ALT 10 (*)    All other components within normal limits  CBC -  Abnormal; Notable for the following:    WBC 13.5 (*)    Hemoglobin 11.1 (*)    HCT 34.2 (*)    MCV 68.3 (*)    MCH 22.2 (*)    RDW 22.0 (*)    Platelets 422 (*)    All other components within normal limits  LIPASE, BLOOD  URINALYSIS, ROUTINE W REFLEX MICROSCOPIC    EKG  EKG Interpretation None       Radiology No results found.  Procedures Procedures (including critical care time)  Medications Ordered in ED Medications - No data to display   Initial Impression / Assessment and Plan / ED Course  I have reviewed the triage vital signs and the nursing notes.  Pertinent labs & imaging results that were available during my care of the patient were reviewed by me and considered in my medical decision making (see chart for details).     Pt with hx of Crohn's.  Non-toxic appearing.  Vitals stable.  Abd is soft with mild tenderness and no concerning sx's for surgical abdomen.  Pt was seen by GI at [facility] on 08/19/16 and referred to surgery for symptomatic stricture at the TI.  Pt has surgery appt in April. I doubt emergent abdominal process at present and since pt had recent colonoscopy, I do not feel that additional imaging is warranted.    Pain improved after  IVF's, pain medication.  Pt is feeling better and states he is ready for d/c. Also seen by attending physician prior to d/c.  He agrees to close f/u with local GI for possible pain management referral. Return precautions discussed.    Final Clinical Impressions(s) / ED Diagnoses   Final diagnoses:  Left lower quadrant pain  Crohn's disease without complication, unspecified gastrointestinal tract location [facility])    New Prescriptions New Prescriptions   No medications on file     Bufford Lope 09/01/16 2041    Daleen Bo, MD 09/02/16 [place], MD 09/02/16 [phone]

## 2016-09-04 ENCOUNTER — Encounter (HOSPITAL_COMMUNITY): Payer: Self-pay | Admitting: *Deleted

## 2016-09-04 ENCOUNTER — Ambulatory Visit (HOSPITAL_COMMUNITY)
Admission: RE | Admit: 2016-09-04 | Discharge: 2016-09-04 | Disposition: A | Discharge status: Home / Self Care | Payer: BLUE CROSS/BLUE SHIELD | Source: Ambulatory Visit | Attending: Emergency Medicine | Admitting: Emergency Medicine

## 2016-09-04 ENCOUNTER — Emergency Department (HOSPITAL_COMMUNITY): Payer: BLUE CROSS/BLUE SHIELD

## 2016-09-04 ENCOUNTER — Emergency Department (HOSPITAL_COMMUNITY)
Admission: EM | Admit: 2016-09-04 | Discharge: 2016-09-04 | Disposition: A | Discharge status: Home / Self Care | Payer: BLUE CROSS/BLUE SHIELD | Attending: Emergency Medicine | Admitting: Emergency Medicine

## 2016-09-04 DIAGNOSIS — M79605 Pain in left leg: Secondary | ICD-10-CM | POA: Diagnosis not present

## 2016-09-04 DIAGNOSIS — I10 Essential (primary) hypertension: Secondary | ICD-10-CM | POA: Insufficient documentation

## 2016-09-04 DIAGNOSIS — R1084 Generalized abdominal pain: Secondary | ICD-10-CM | POA: Diagnosis not present

## 2016-09-04 DIAGNOSIS — M79662 Pain in left lower leg: Secondary | ICD-10-CM

## 2016-09-04 DIAGNOSIS — M79604 Pain in right leg: Secondary | ICD-10-CM | POA: Insufficient documentation

## 2016-09-04 DIAGNOSIS — Z79899 Other long term (current) drug therapy: Secondary | ICD-10-CM | POA: Insufficient documentation

## 2016-09-04 DIAGNOSIS — Z87891 Personal history of nicotine dependence: Secondary | ICD-10-CM | POA: Insufficient documentation

## 2016-09-04 DIAGNOSIS — M79661 Pain in right lower leg: Secondary | ICD-10-CM

## 2016-09-04 LAB — COMPREHENSIVE METABOLIC PANEL
ALT: 8 U/L — ABNORMAL LOW (ref 17–63)
AST: 13 U/L — ABNORMAL LOW (ref 15–41)
Albumin: 3.4 g/dL — ABNORMAL LOW (ref 3.5–5.0)
Alkaline Phosphatase: 53 U/L (ref 38–126)
Anion gap: 8 (ref 5–15)
BUN: 9 mg/dL (ref 6–20)
CHLORIDE: 99 mmol/L — AB (ref 101–111)
CO2: 30 mmol/L (ref 22–32)
Calcium: 8.8 mg/dL — ABNORMAL LOW (ref 8.9–10.3)
Creatinine, Ser: 0.75 mg/dL (ref 0.61–1.24)
Glucose, Bld: 96 mg/dL (ref 65–99)
POTASSIUM: 3.4 mmol/L — AB (ref 3.5–5.1)
Sodium: 137 mmol/L (ref 135–145)
Total Bilirubin: 0.3 mg/dL (ref 0.3–1.2)
Total Protein: 6.8 g/dL (ref 6.5–8.1)

## 2016-09-04 LAB — CBC WITH DIFFERENTIAL/PLATELET
Basophils Absolute: 0.1 10*3/uL (ref 0.0–0.1)
Basophils Relative: 0 %
Eosinophils Absolute: 0.3 10*3/uL (ref 0.0–0.7)
Eosinophils Relative: 2 %
HCT: 31.3 % — ABNORMAL LOW (ref 39.0–52.0)
HEMOGLOBIN: 10.4 g/dL — AB (ref 13.0–17.0)
LYMPHS ABS: 2.9 10*3/uL (ref 0.7–4.0)
LYMPHS PCT: 21 %
MCH: 22.6 pg — ABNORMAL LOW (ref 26.0–34.0)
MCHC: 33.2 g/dL (ref 30.0–36.0)
MCV: 68 fL — AB (ref 78.0–100.0)
Monocytes Absolute: 1 10*3/uL (ref 0.1–1.0)
Monocytes Relative: 7 %
NEUTROS ABS: 9.4 10*3/uL — AB (ref 1.7–7.7)
NEUTROS PCT: 69 %
Platelets: 413 10*3/uL — ABNORMAL HIGH (ref 150–400)
RBC: 4.6 MIL/uL (ref 4.22–5.81)
RDW: 21.5 % — ABNORMAL HIGH (ref 11.5–15.5)
WBC: 13.7 10*3/uL — AB (ref 4.0–10.5)

## 2016-09-04 LAB — CK: Total CK: 74 U/L (ref 49–397)

## 2016-09-04 LAB — MAGNESIUM: MAGNESIUM: 1.7 mg/dL (ref 1.7–2.4)

## 2016-09-04 MED ORDER — DIAZEPAM 5 MG PO TABS
5.0000 mg | ORAL_TABLET | Freq: Once | ORAL | Status: AC
  Administered 2016-09-04: 5 mg via ORAL
  Filled 2016-09-04: qty 1

## 2016-09-04 MED ORDER — HYDROMORPHONE HCL 1 MG/ML IJ SOLN
1.0000 mg | Freq: Once | INTRAMUSCULAR | Status: AC
  Administered 2016-09-04: 1 mg via INTRAVENOUS
  Filled 2016-09-04: qty 1

## 2016-09-04 MED ORDER — POTASSIUM CHLORIDE CRYS ER 20 MEQ PO TBCR
40.0000 meq | EXTENDED_RELEASE_TABLET | Freq: Once | ORAL | Status: AC
  Administered 2016-09-04: 40 meq via ORAL
  Filled 2016-09-04: qty 2

## 2016-09-04 MED ORDER — DIAZEPAM 5 MG PO TABS
5.0000 mg | ORAL_TABLET | Freq: Four times a day (QID) | ORAL | 0 refills | Status: DC | PRN
Start: 2016-09-04 — End: 2016-09-14

## 2016-09-04 NOTE — ED Provider Notes (Signed)
Blood pressure (!) 153/101, pulse 66, temperature 99.5 F (37.5 C), temperature source Oral, resp. rate 16, height 5' 9"  (1.753 m), weight 135 lb (61.2 kg), SpO2 98 %.  Assuming care from Dr. Regenia Skeeter.  In short, Joshua Robertson is a 41 y.o. male with a chief complaint of Leg Pain .  Refer to the original H&P for additional details.  Patient returned to the emergency department for venous ultrasound of bilateral lower extremities. There is no evidence of DVT on these studies. Patient reports 2-3 months of cramping pain in the legs. He was prescribed a muscle relaxer which I encouraged him to fill and begin using. Also encouraged Tylenol. Patient cannot take Motrin because of history of Crohn's. He will follow with the health department for follow up care.   Nanda Quinton, MD    Margette Fast, MD 09/04/16 1120

## 2016-09-04 NOTE — Discharge Instructions (Signed)
MPORTANT PATIENT INSTRUCTIONS:  You have been scheduled for an Outpatient Ultrasound.    If your appointment is scheduled for a Saturday or Sunday, please go to the [facility] Emergency Department Registration Desk at least 15 minutes prior to your appointment time and tell them you are there for an ultrasound.    If your appointment is scheduled for a weekday (Monday-Friday), please go directly to the [facility] Radiology Department at least 15 minutes prior to your appointment time and tell them you are there for an ultrasound.  Please call [phone] with questions.

## 2016-09-04 NOTE — ED Triage Notes (Signed)
Pt states his chron's is causing his legs to hurt to the point he cant walk.

## 2016-09-04 NOTE — ED Notes (Signed)
Pt alert & oriented x4. Patient given discharge instructions, paperwork & prescription(s). Patient verbalized understanding. Pt left department w/ no further questions.

## 2016-09-04 NOTE — ED Provider Notes (Signed)
[place] DEPT Provider Note   CSN: [id number] Arrival date & time: 09/04/16  0034   By signing my name below, I, Delton Prairie, attest that this documentation has been prepared under the direction and in the presence of Sherwood Gambler, MD  Electronically Signed: Delton Prairie, ED Scribe. 09/04/16. 12:56 AM.   History   Chief Complaint Chief Complaint  Patient presents with  . Leg Pain   The history is provided by the patient. No language interpreter was used.   HPI Comments:  Joshua Robertson is a 41 y.o. male, with a PMHx of Crohn's disease on Entyvio, who presents to the Emergency Department complaining of ongoing "10/10" bilateral calf pain. Started several months ago, severe tonight. Pt also reports "6/10" abdominal pain and left foot swelling. His pain is worse upon palpation. His leg pain is also worse with bearing weight. He has been unable to bear weight and walk because of the amount of pain. There is no weakness or numbness. No injuries. Pt was seen on 09/01/16 for similar symptoms. No alleviating factors noted. Pt denies back pain, knee pain, fevers, vomiting, diarrhea or any other associated symptoms. He notes he has a follow up appointment at [facility].  Past Medical History:  Diagnosis Date  . BMI between 19-24,adult JAN 2010 149 LBS  . Crohn's disease ([place]) AUG 2009 NUR [place]   HB 9.4 FERRITIN 26  140 LBS; no serologies-pt can't afford test  . Helicobacter pylori gastritis 2009  . Hypertension   . Migraine headache    NONE SINCE AGE 19  . Sacroiliitis ([place]) 2009  . Spondyloarthropathy ([place]) 2009 EROSIVE    Patient Active Problem List   Diagnosis Date Noted  . Weakness of both arms 07/07/2016  . Small bowel stricture 06/02/2016  . Malnutrition of moderate degree 06/02/2016  . Colitis 06/01/2016  . Marijuana abuse 06/01/2016  . Anemia 05/28/2016  . Pain of left great toe 04/02/2015  . Perianal abscess 07/11/2013  . Essential hypertension 10/22/2010  . WRIST  PAIN, RIGHT 07/25/2009  . MIGRAINE HEADACHE 02/04/2009  . WEIGHT LOSS 02/04/2009  . Generalized abdominal pain 02/04/2009  . TINEA CORPORIS 11/22/2008  . Crohn's disease of both small and large intestine with intestinal obstruction ([place]) 08/15/2008    Past Surgical History:  Procedure Laterality Date  . COLONOSCOPY  03/2015   [facility]: Crohn's like stricture at ICV. Stricture was not traversable. abnormal mucosa of ICV. Bx, c/w Crohn's.   . COLONOSCOPY N/A 07/30/2013   stenotic IC VALVE. UNABLE TO INTUBATE ILEUM.       Home Medications    Prior to Admission medications   Medication Sig Start Date End Date Taking? Authorizing Provider  cholecalciferol (VITAMIN D) 1000 units tablet Take 1 tablet (1,000 Units total) by mouth daily. BEGIN AFTER WEEKLY VIT D DOSING IS COMPLETE 04/11/16  Yes [name], MD  ciprofloxacin (CIPRO) 500 MG tablet Take 1 tablet (500 mg total) by mouth 2 (two) times daily. 06/03/16  Yes [name], MD  cloNIDine (CATAPRES) 0.1 MG tablet Take 1 tablet (0.1 mg total) by mouth 2 (two) times daily. 08/09/12  Yes [name], MD  methocarbamol (ROBAXIN) 500 MG tablet Take 1 tablet (500 mg total) by mouth 2 (two) times daily. 07/01/16  Yes [name], PA-C  metroNIDAZOLE (FLAGYL) 500 MG tablet Take 1 tablet (500 mg total) by mouth 3 (three) times daily. 06/03/16  Yes [name], MD  naproxen (NAPROSYN) 500 MG tablet Take 1 tablet (  500 mg total) by mouth 2 (two) times daily. 07/01/16  Yes [name], PA-C  predniSONE (DELTASONE) 5 MG tablet Take 6 tablets (30 mg total) by mouth as directed. Take 6 tablets (30 mg) daily for 7 days, then take 5 tablets (25 mg) daily for 7 days, then take 4 tablets (20 mg) daily for 7 days, then take 3 tablets (15 mg) daily for 7 days, then take 2 tablets (10 mg) daily for 7 days, then take 1 tablet (5 mg) daily for 7 days 07/18/16  Yes [name], MD  vedolizumab (ENTYVIO) 300 MG injection Inject 300 mg into the  vein every 8 (eight) weeks.    Yes Historical Provider, MD  diazepam (VALIUM) 5 MG tablet Take 1 tablet (5 mg total) by mouth every 6 (six) hours as needed for muscle spasms. 09/04/16   Sherwood Gambler, MD    Family History Family History  Problem Relation Age of Onset  . Hypertension Mother   . Hypertension Father   . Colon cancer Neg Hx   . Colon polyps Neg Hx   . Inflammatory bowel disease Neg Hx     Social History Social History  Substance Use Topics  . Smoking status: Former Smoker    Packs/day: 0.50    Types: Cigarettes    Quit date: 03/02/2016  . Smokeless tobacco: Never Used  . Alcohol use 0.0 oz/week     Comment: very rare     Allergies   Iodinated diagnostic agents; Azathioprine; Penicillins; and Pentasa [mesalamine er]   Review of Systems Review of Systems  Constitutional: Negative for fever.  Gastrointestinal: Positive for abdominal pain. Negative for diarrhea, nausea and vomiting.  Musculoskeletal: Positive for myalgias. Negative for back pain.  All other systems reviewed and are negative.    Physical Exam Updated Vital Signs BP (!) 153/101 (BP Location: Right Arm)   Pulse 66   Temp 99.5 F (37.5 C) (Oral)   Resp 16   Ht 5' 9"  (1.753 m)   Wt 135 lb (61.2 kg)   SpO2 98%   BMI 19.94 kg/m   Physical Exam  Constitutional: He is oriented to person, place, and time. He appears well-developed and well-nourished.  HENT:  Head: Normocephalic and atraumatic.  Right Ear: External ear normal.  Left Ear: External ear normal.  Nose: Nose normal.  Eyes: Right eye exhibits no discharge. Left eye exhibits no discharge.  Neck: Neck supple.  Cardiovascular: Normal rate, regular rhythm and normal heart sounds.   Pulses:      Dorsalis pedis pulses are 2+ on the right side, and 2+ on the left side.  Pulmonary/Chest: Effort normal and breath sounds normal.  Abdominal: Soft. He exhibits no distension. There is tenderness.  Mild diffuse tenderness     Musculoskeletal: He exhibits tenderness. He exhibits no edema.  No tenderness or swelling to the bilateral hips, thigh and knees. Diffuse tenderness to minimal palpation to bilateral calves and feet. Dorsiflexion causes increased pain in the calf. No calf swelling. Mild swelling over the proximal lateral left foot.   Neurological: He is alert and oriented to person, place, and time.  Reflex Scores:      Patellar reflexes are 2+ on the right side and 2+ on the left side.      Achilles reflexes are 2+ on the right side and 2+ on the left side. 5/5 strength in bilateral lower extremities and normal sensation   Skin: Skin is warm and dry.  Nursing note and vitals  reviewed.    ED Treatments / Results  DIAGNOSTIC STUDIES:  Oxygen Saturation is 98% on RA, normal by my interpretation.    COORDINATION OF CARE:  12:52 AM Discussed treatment plan with pt at bedside and pt agreed to plan.  Labs (all labs ordered are listed, but only abnormal results are displayed) Labs Reviewed  CBC WITH DIFFERENTIAL/PLATELET - Abnormal; Notable for the following:       Result Value   WBC 13.7 (*)    Hemoglobin 10.4 (*)    HCT 31.3 (*)    MCV 68.0 (*)    MCH 22.6 (*)    RDW 21.5 (*)    Platelets 413 (*)    Neutro Abs 9.4 (*)    All other components within normal limits  COMPREHENSIVE METABOLIC PANEL - Abnormal; Notable for the following:    Potassium 3.4 (*)    Chloride 99 (*)    Calcium 8.8 (*)    Albumin 3.4 (*)    AST 13 (*)    ALT 8 (*)    All other components within normal limits  MAGNESIUM  CK    EKG  EKG Interpretation None       Radiology Dg Foot Complete Left  Result Date: 09/04/2016 CLINICAL DATA:  Initial evaluation for acute pain, swelling. EXAM: LEFT FOOT - COMPLETE 3+ VIEW COMPARISON:  None. FINDINGS: There is no evidence of fracture or dislocation. Joint spaces fairly well-maintained. Posterior calcaneal enthesophyte noted. Minimal degenerative spurring noted at the  dorsal midfoot. Soft tissues are unremarkable. IMPRESSION: No radiographic evidence for acute abnormality about the left foot. Electronically Signed   By: Jeannine Boga M.D.   On: 09/04/2016 01:46    Procedures Procedures (including critical care time)  Medications Ordered in ED Medications  HYDROmorphone (DILAUDID) injection 1 mg (1 mg Intravenous Given 09/04/16 0123)  potassium chloride SA (K-DUR,KLOR-CON) CR tablet 40 mEq (40 mEq Oral Given 09/04/16 0148)  HYDROmorphone (DILAUDID) injection 1 mg (1 mg Intravenous Given 09/04/16 0241)  diazepam (VALIUM) tablet 5 mg (5 mg Oral Given 09/04/16 0240)     Initial Impression / Assessment and Plan / ED Course  I have reviewed the triage vital signs and the nursing notes.  Pertinent labs & imaging results that were available during my care of the patient were reviewed by me and considered in my medical decision making (see chart for details).     It's possible his bilateral calf pain as a side effect of his ulcerative colitis medicine but no obvious cause at this time. This appears to be an acute on chronic problem. Pain is better after 2 doses of Dilaudid. He is unable to take NSAIDs. Will try Valium to help with possible muscle spasming. Electrolytes show no significant abnormality that could cause his symptoms. He does have mild hypokalemia and why don't think this is causing his symptoms I will replete this in the ED. CK is normal. Abdominal exam is benign and this appears to be a chronic issue as well. No fevers or vomiting to suggest need for CT at this time. WBC is chronically elevated near 13. Given the calf pain, as well as a increased risk for blood clots given his ulcerative colitis, I will order bilateral DVT ultrasound for this upcoming morning as this is not available at this time. However my suspicion of DVT is low.At this point he appears stable for discharge and follow-up with his PCP and gastroenterologist.  Final Clinical  Impressions(s) / ED Diagnoses  Final diagnoses:  Bilateral calf pain    New Prescriptions Discharge Medication List as of 09/04/2016  3:26 AM    START taking these medications   Details  diazepam (VALIUM) 5 MG tablet Take 1 tablet (5 mg total) by mouth every 6 (six) hours as needed for muscle spasms., Starting Sat 09/04/2016, Print       I personally performed the services described in this documentation, which was scribed in my presence. The recorded information has been reviewed and is accurate.     Sherwood Gambler, MD 09/04/16 [phone]

## 2016-09-06 ENCOUNTER — Telehealth: Payer: Self-pay

## 2016-09-06 NOTE — Telephone Encounter (Signed)
REVIEWED-NO ADDITIONAL RECOMMENDATIONS. 

## 2016-09-06 NOTE — Telephone Encounter (Signed)
Pt called and said he is having bad leg pain ( back of his legs from the knees down). The pain has been going on several weeks now and he cannot even walk at this point. He is on Entyvio every 8 weeks and said he last had in Feb. He has appt at [facility] for consult with a surgeon on 09/14/2016 due to his Crohn's. PT is aware that Dr. Oneida Alar is off today.  I told pt I will let Dr. Oneida Alar know what she returns, and advised him to go back to ED if he cannot stand the pain in his legs. He said one time they gave him a steroid shot that helped.  I am routing this note to Roseanne Kaufman, NP who is making hospital rounds today for her advice!

## 2016-09-06 NOTE — Telephone Encounter (Signed)
Pt has wanted me to call his mom number, [phone] so she could get in touch with him. I called and she will have him call me.

## 2016-09-06 NOTE — Telephone Encounter (Addendum)
Called patient'S MOTHER TO DISCUSS CONCERNS. PT CANNOT WALK. HE IS IN A LOT OF PAIN. HE HAS BEEN ON ENTYVIO SINCE JUN 2017. LAST DOSE DEC 2017. IT UNLIKELY CAUSING HIS LEG PAIN. HOWEVER, HE SHOULD HAVE IMAGING OF HIS BACK TO LOOK FOR OCCULT ABSCESS OR ACUTE DISC PROTRUSION/IMPINGMENT.  MOTHER WILL HAVE PT CALL [phone].  HAVING SHARP/ACHY/THROBBING PAIN IN CALF MUSCLES BOTH LEGS FROM KNEES TO ANKLES SINCE FEB 2018. IF LIFTS TOES UP WHILE WALKING THEN HE HAS PAIN SHOOTING FROM TOES TO CALVES AND CALVES TO TOES. CAN WIGGLE TOES. WHEN LAYING DOWN FEEL LIKE HE HAS LEG CRAMPS. IT HURTS ONLY BELOW HIS KNEES. CAN WALK BUT IT HURTS. NEEDS TO BE ASSISTED WITH AMBULATION. NO PAIN IN TESTICLES, TOE, RECTUM OR BOTTOM OF HIS FEET.NO BACK PAIN. MILD SWELLING IN LEFT FOOT.  FELT SAME PAIN IN ARMS BUT THAT WENT AWAY.   SAW GI DOCTOR AT [facility] FEB 2018. STEROID TAPER WRITTEN FOR 4-5 WEEKS. FELT LIKE LEG PAIN BETTER ON PREDNISONE. ARM WEAKNESS WENT AWAY. STEROID SHOT IN ED. APPT TO SEE SURGERY [facility] APR 3.   NO ORTHO VISIT. NO RECENT INJURY. NO PAIN IN HIS BACK. REFER TO ORTHO ASAP FOR AN EVALUATION WITHIN 24-48 HRS, Dx: SEVERE BILATERAL LOWER LEG PAIN. PT ASKED FOR PAIN MEDS. WILL REFER TO ORTHO FOR PAIN MANAGEMENT. DISCUSSED PLAN WITH MOTHER.

## 2016-09-06 NOTE — Telephone Encounter (Signed)
Pt is aware to go to ED if he worsens.  Waiting on Dr. Oneida Alar to reply.

## 2016-09-06 NOTE — Telephone Encounter (Signed)
When did his symptoms start? When was last dose of Entyvio? In reviewing adverse drug reactions, there is a 12% chance of arthralgias, limb pain 3%.   Does he have weakness on one side of the body vs the other? Any memory problems, confusion, etc?   To ED if worsens. When is next dose of Entyvio?

## 2016-09-06 NOTE — Telephone Encounter (Signed)
PT began after he started the [facility], having pain in his arms. Dr. Oneida Alar order MRI and some other tests. She thinks his last Entyvio treatment was in Feb and not sure when next is to be scheduled. He said [place] said they will consider a different medication, but only after he has surgery. He is aware if he worsens to go to the ED.

## 2016-09-06 NOTE — Telephone Encounter (Signed)
I doubt he has multifocal leukoencephalopathy. I have reviewed ED visit. He had bilateral calf pain, negative duplex ultrasound. He has no weakness. He has a recent brain MRI on file from when he was noting arm pain. Need to closely monitor and await further recommendations from Dr. Oneida Alar.

## 2016-09-07 ENCOUNTER — Other Ambulatory Visit: Payer: Self-pay

## 2016-09-07 DIAGNOSIS — M79604 Pain in right leg: Secondary | ICD-10-CM

## 2016-09-07 DIAGNOSIS — M79605 Pain in left leg: Principal | ICD-10-CM

## 2016-09-07 NOTE — Telephone Encounter (Signed)
CALLED DR. HARRISON TO DISCUSS PT. LVM-BRIEF HISTORY/REASON FOR CONSULT. Joshua Robertson.

## 2016-09-07 NOTE — Telephone Encounter (Signed)
Referral has been made.

## 2016-09-07 NOTE — Telephone Encounter (Signed)
Noted  

## 2016-09-08 ENCOUNTER — Telehealth: Payer: Self-pay

## 2016-09-08 NOTE — Telephone Encounter (Signed)
Pt's mom, Kathryne Eriksson, called and said they have not heard from the orthopedic referral.  I spoke to Ginger and she said the referral was made ASAP.  Mariann Laster said to make sure Ortho has her number to be called, his number does not work sometimes.  Ginger, please note this and call Ortho if you need to. Thanks!

## 2016-09-08 NOTE — Telephone Encounter (Signed)
Spoke with Dr.Keeling's office and they are going to call his mother this morning to get him in.

## 2016-09-08 NOTE — Telephone Encounter (Signed)
Thanks

## 2016-09-08 NOTE — Telephone Encounter (Signed)
REVIEWED-NO ADDITIONAL RECOMMENDATIONS. 

## 2016-09-11 NOTE — Telephone Encounter (Signed)
NPV W/ORTHO Sep 17 2016.

## 2016-09-12 ENCOUNTER — Encounter (HOSPITAL_COMMUNITY): Payer: Self-pay | Admitting: *Deleted

## 2016-09-12 DIAGNOSIS — M5106 Intervertebral disc disorders with myelopathy, lumbar region: Principal | ICD-10-CM | POA: Insufficient documentation

## 2016-09-12 DIAGNOSIS — I10 Essential (primary) hypertension: Secondary | ICD-10-CM | POA: Diagnosis not present

## 2016-09-12 DIAGNOSIS — Z79899 Other long term (current) drug therapy: Secondary | ICD-10-CM | POA: Diagnosis not present

## 2016-09-12 DIAGNOSIS — F1290 Cannabis use, unspecified, uncomplicated: Secondary | ICD-10-CM | POA: Insufficient documentation

## 2016-09-12 DIAGNOSIS — Z87891 Personal history of nicotine dependence: Secondary | ICD-10-CM | POA: Diagnosis not present

## 2016-09-12 DIAGNOSIS — M79662 Pain in left lower leg: Secondary | ICD-10-CM | POA: Diagnosis present

## 2016-09-12 NOTE — ED Triage Notes (Signed)
Pt c/o continued bilateral leg pain and cramping. Pt states he is supposed to see Dr. Luna Glasgow on April 4th, 2018. Pt states he has had to be in a wheelchair sine Friday. Pt states prednisone helps him but when they take him off of it, the pain and muscle cramps come back.

## 2016-09-13 ENCOUNTER — Observation Stay (HOSPITAL_COMMUNITY)
Admission: EM | Admit: 2016-09-13 | Discharge: 2016-09-14 | Disposition: A | Discharge status: Home / Self Care | Payer: BLUE CROSS/BLUE SHIELD | Attending: Internal Medicine | Admitting: Internal Medicine

## 2016-09-13 ENCOUNTER — Emergency Department (HOSPITAL_COMMUNITY): Payer: BLUE CROSS/BLUE SHIELD

## 2016-09-13 ENCOUNTER — Encounter (HOSPITAL_COMMUNITY): Payer: Self-pay

## 2016-09-13 DIAGNOSIS — M79662 Pain in left lower leg: Secondary | ICD-10-CM

## 2016-09-13 DIAGNOSIS — R52 Pain, unspecified: Secondary | ICD-10-CM | POA: Diagnosis not present

## 2016-09-13 DIAGNOSIS — M79661 Pain in right lower leg: Secondary | ICD-10-CM

## 2016-09-13 DIAGNOSIS — M25569 Pain in unspecified knee: Secondary | ICD-10-CM

## 2016-09-13 DIAGNOSIS — M79604 Pain in right leg: Secondary | ICD-10-CM

## 2016-09-13 DIAGNOSIS — R29898 Other symptoms and signs involving the musculoskeletal system: Secondary | ICD-10-CM

## 2016-09-13 DIAGNOSIS — M5126 Other intervertebral disc displacement, lumbar region: Secondary | ICD-10-CM

## 2016-09-13 DIAGNOSIS — K50812 Crohn's disease of both small and large intestine with intestinal obstruction: Secondary | ICD-10-CM | POA: Diagnosis present

## 2016-09-13 DIAGNOSIS — M79605 Pain in left leg: Secondary | ICD-10-CM

## 2016-09-13 DIAGNOSIS — D649 Anemia, unspecified: Secondary | ICD-10-CM | POA: Diagnosis present

## 2016-09-13 LAB — CBC
HCT: 35.5 % — ABNORMAL LOW (ref 39.0–52.0)
Hemoglobin: 11.9 g/dL — ABNORMAL LOW (ref 13.0–17.0)
MCH: 22.7 pg — AB (ref 26.0–34.0)
MCHC: 33.5 g/dL (ref 30.0–36.0)
MCV: 67.7 fL — AB (ref 78.0–100.0)
PLATELETS: 549 10*3/uL — AB (ref 150–400)
RBC: 5.24 MIL/uL (ref 4.22–5.81)
RDW: 21.2 % — AB (ref 11.5–15.5)
WBC: 16.5 10*3/uL — AB (ref 4.0–10.5)

## 2016-09-13 LAB — COMPREHENSIVE METABOLIC PANEL
ALK PHOS: 81 U/L (ref 38–126)
ALT: 7 U/L — AB (ref 17–63)
AST: 14 U/L — AB (ref 15–41)
Albumin: 3.6 g/dL (ref 3.5–5.0)
Anion gap: 13 (ref 5–15)
BUN: 10 mg/dL (ref 6–20)
CHLORIDE: 96 mmol/L — AB (ref 101–111)
CO2: 28 mmol/L (ref 22–32)
CREATININE: 0.82 mg/dL (ref 0.61–1.24)
Calcium: 9.9 mg/dL (ref 8.9–10.3)
GFR calc Af Amer: >60 mL/min (ref >60)
Glucose, Bld: 111 mg/dL — ABNORMAL HIGH (ref 65–99)
Potassium: 4.3 mmol/L (ref 3.5–5.1)
Sodium: 137 mmol/L (ref 135–145)
Total Bilirubin: 0.5 mg/dL (ref 0.3–1.2)
Total Protein: 8.2 g/dL — ABNORMAL HIGH (ref 6.5–8.1)

## 2016-09-13 LAB — MAGNESIUM: MAGNESIUM: 2 mg/dL (ref 1.7–2.4)

## 2016-09-13 LAB — SEDIMENTATION RATE: Sed Rate: 45 mm/hr — ABNORMAL HIGH (ref 0–16)

## 2016-09-13 LAB — C-REACTIVE PROTEIN: CRP: 25.5 mg/dL — ABNORMAL HIGH (ref <1.0)

## 2016-09-13 MED ORDER — FENTANYL CITRATE (PF) 100 MCG/2ML IJ SOLN
50.0000 ug | Freq: Once | INTRAMUSCULAR | Status: AC
  Administered 2016-09-13: 50 ug via INTRAVENOUS

## 2016-09-13 MED ORDER — CLONIDINE HCL 0.1 MG PO TABS: 0.1000 mg | ORAL_TABLET | Freq: Two times a day (BID) | ORAL | Status: DC

## 2016-09-13 MED ORDER — DEXAMETHASONE SODIUM PHOSPHATE 10 MG/ML IJ SOLN
10.0000 mg | Freq: Once | INTRAMUSCULAR | Status: AC
  Administered 2016-09-13: 10 mg via INTRAVENOUS
  Filled 2016-09-13: qty 1

## 2016-09-13 MED ORDER — DIAZEPAM 5 MG PO TABS
10.0000 mg | ORAL_TABLET | Freq: Once | ORAL | Status: AC
  Administered 2016-09-13: 10 mg via ORAL
  Filled 2016-09-13: qty 2

## 2016-09-13 MED ORDER — SENNOSIDES-DOCUSATE SODIUM 8.6-50 MG PO TABS: 1.0000 | ORAL_TABLET | Freq: Every evening | ORAL | Status: DC | PRN

## 2016-09-13 MED ORDER — GADOBENATE DIMEGLUMINE 529 MG/ML IV SOLN
10.0000 mL | Freq: Once | INTRAVENOUS | Status: AC | PRN
  Administered 2016-09-13: 10 mL via INTRAVENOUS

## 2016-09-13 MED ORDER — FENTANYL CITRATE (PF) 100 MCG/2ML IJ SOLN
INTRAMUSCULAR | Status: AC
  Filled 2016-09-13: qty 2

## 2016-09-13 MED ORDER — METHYLPREDNISOLONE SODIUM SUCC 40 MG IJ SOLR
40.0000 mg | Freq: Two times a day (BID) | INTRAMUSCULAR | Status: AC
  Administered 2016-09-13 – 2016-09-14 (×2): 40 mg via INTRAVENOUS
  Filled 2016-09-13 (×2): qty 1

## 2016-09-13 MED ORDER — ENOXAPARIN SODIUM 40 MG/0.4ML ~~LOC~~ SOLN
40.0000 mg | SUBCUTANEOUS | Status: DC
  Administered 2016-09-13: 40 mg via SUBCUTANEOUS
  Filled 2016-09-13: qty 0.4

## 2016-09-13 MED ORDER — HYDROCODONE-ACETAMINOPHEN 5-325 MG PO TABS
1.0000 | ORAL_TABLET | ORAL | Status: DC | PRN
  Administered 2016-09-13: 2 via ORAL
  Filled 2016-09-13: qty 2

## 2016-09-13 MED ORDER — CLONIDINE HCL 0.1 MG PO TABS
0.1000 mg | ORAL_TABLET | Freq: Two times a day (BID) | ORAL | Status: DC
  Administered 2016-09-13 – 2016-09-14 (×2): 0.1 mg via ORAL
  Filled 2016-09-13 (×2): qty 1

## 2016-09-13 MED ORDER — PREDNISONE 20 MG PO TABS
40.0000 mg | ORAL_TABLET | Freq: Every day | ORAL | Status: DC
  Administered 2016-09-13 – 2016-09-14 (×2): 40 mg via ORAL
  Filled 2016-09-13 (×2): qty 2

## 2016-09-13 MED ORDER — FENTANYL CITRATE (PF) 100 MCG/2ML IJ SOLN
50.0000 ug | Freq: Once | INTRAMUSCULAR | Status: AC
  Administered 2016-09-13: 50 ug via INTRAVENOUS
  Filled 2016-09-13: qty 2

## 2016-09-13 NOTE — H&P (Signed)
History and Physical    Joshua Robertson XFG:[id number] DOB: Dec 31, 1975 DOA: 09/13/2016  PCP: Raiford Simmonds., PA-C   Patient coming from: Home  Chief Complaint: Bilat leg pain  HPI: Joshua Robertson is a 41 y.o. male with medical history significant of Crohn's disease, hypertension, anemia. Patient presented today with complaints of bilateral leg pain that started in January- describes the pain as a 'toothache". Pain is constant severe to the point that he cannot walk, as pain is significantly worse on standing. Pain starts from his knees and down to his feet, worse on the side of his calves. Pain initially started in his arms and legs, but after the last steroid course for his crohns dx, the pain in his arms stopped. He had significant relief from pain in his lower extremities while on steroid course but this slowly returned with the steroid taper.  Patient is very positive that he has no weakness of his lower extremities or upper extremities, no numbness, no loss of sensation, no swelling, no redness, never had a rash. No fever. No family hx of similar pain. Has some mild sore throat today. Patient says he has mild  Occasional back pain, no radiating pain down extremities.  Patient was switched from Remicade to Entyvio-02/2016. He takes Entyvio once every 8 weeks., This medication was stopped by his gastroenterologist due to concern t this was the cause of his extremity pain. He was due for his next dose 4 weeks ago, so his last dose was 12 weeks ago.   ED Course: Patient's vitals were stable, MRI lumbar spine was done which showed L4-L5 right posterior lateral protrusion with mild mass effect upon the right L5 nerve roots. Hospitalist was called for intractable pain and possibly transfer to [facility] for neurosurgical evaluation for MRI findings. Was given IV Decadron 10 mg in the ED without significant improvement in pain.  Review of Systems: As per HPI otherwise 10 point review of systems negative.    CONSTITUTIONAL- No Fever, weightloss, night sweat or change in appetite. SKIN- No Rash, colour changes or itching. HEAD- No Headache or dizziness. Mouth/throat- Mild Sorethroat, no dentures, or bleeding gums. RESPIRATORY- No Cough or SOB. CARDIAC- No Palpitations, or chest pain. GI- No vomiting, diarrhoea, constipation, abd pain- these are stable URINARY- No Frequency, urgency, straining or dysuria. NEUROLOGIC- No Numbness, syncope, seizures or burning.  Past Medical History:  Diagnosis Date  . BMI between 19-24,adult JAN 2010 149 LBS  . Crohn's disease ([place]) AUG 2009 NUR [place]   HB 9.4 FERRITIN 26  140 LBS; no serologies-pt can't afford test  . Helicobacter pylori gastritis 2009  . Hypertension   . Migraine headache    NONE SINCE AGE 52  . Sacroiliitis ([place]) 2009  . Spondyloarthropathy ([place]) 2009 EROSIVE    Past Surgical History:  Procedure Laterality Date  . COLONOSCOPY  03/2015   Eduard Roux [facility]: Crohn's like stricture at ICV. Stricture was not traversable. abnormal mucosa of ICV. Bx, c/w Crohn's.   . COLONOSCOPY N/A 07/30/2013   stenotic IC VALVE. UNABLE TO INTUBATE ILEUM.     reports that he quit smoking about 6 months ago. His smoking use included Cigarettes. He smoked 0.50 packs per day. He has never used smokeless tobacco. He reports that he drinks alcohol. He reports that he uses drugs, including Marijuana.  Allergies  Allergen Reactions  . Iodinated Diagnostic Agents Nausea And Vomiting  . Azathioprine Nausea And Vomiting  . Penicillins Swelling and Rash    Has patient had  a PCN reaction causing immediate rash, facial/tongue/throat swelling, SOB or lightheadedness with hypotension: Yes Has patient had a PCN reaction causing severe rash involving mucus membranes or skin necrosis: Yes Has patient had a PCN reaction that required hospitalization No Has patient had a PCN reaction occurring within the last 10 years: No If all of the above answers are "NO", then may  proceed with Cephalosporin use.   Marland Kitchen Pentasa [Mesalamine Er] Rash    Family History  Problem Relation Age of Onset  . Hypertension Mother   . Hypertension Father   . Colon cancer Neg Hx   . Colon polyps Neg Hx   . Inflammatory bowel disease Neg Hx     Prior to Admission medications   Medication Sig Start Date End Date Taking? Authorizing Provider  cholecalciferol (VITAMIN D) 1000 units tablet Take 1 tablet (1,000 Units total) by mouth daily. BEGIN AFTER WEEKLY VIT D DOSING IS COMPLETE 04/11/16  Yes [name], MD  cloNIDine (CATAPRES) 0.1 MG tablet Take 1 tablet (0.1 mg total) by mouth 2 (two) times daily. 08/09/12  Yes [name], MD  diazepam (VALIUM) 5 MG tablet Take 1 tablet (5 mg total) by mouth every 6 (six) hours as needed for muscle spasms. Patient not taking: Reported on 09/13/2016 09/04/16   Sherwood Gambler, MD  methocarbamol (ROBAXIN) 500 MG tablet Take 1 tablet (500 mg total) by mouth 2 (two) times daily. Patient not taking: Reported on 09/13/2016 07/01/16   [name], PA-C  naproxen (NAPROSYN) 500 MG tablet Take 1 tablet (500 mg total) by mouth 2 (two) times daily. Patient not taking: Reported on 09/13/2016 07/01/16   [name], PA-C  predniSONE (DELTASONE) 5 MG tablet Take 6 tablets (30 mg total) by mouth as directed. Take 6 tablets (30 mg) daily for 7 days, then take 5 tablets (25 mg) daily for 7 days, then take 4 tablets (20 mg) daily for 7 days, then take 3 tablets (15 mg) daily for 7 days, then take 2 tablets (10 mg) daily for 7 days, then take 1 tablet (5 mg) daily for 7 days Patient not taking: Reported on 09/13/2016 07/18/16   [name], MD    Physical Exam: Vitals:   09/13/16 0600 09/13/16 0615 09/13/16 0630 09/13/16 1344  BP: 126/88  (!) 127/92 (!) 143/99  Pulse: 75 73 70 77  Resp:    18  Temp:      TempSrc:      SpO2: 99% 99% 100% 99%  Weight:      Height:        Constitutional: NAD, calm, comfortable Vitals:   09/13/16 0600 09/13/16 0615 09/13/16 0630  09/13/16 1344  BP: 126/88  (!) 127/92 (!) 143/99  Pulse: 75 73 70 77  Resp:    18  Temp:      TempSrc:      SpO2: 99% 99% 100% 99%  Weight:      Height:       Eyes: PERRL, lids and conjunctivae normal ENMT: Mucous membranes are moist. Posterior pharynx clear of any exudate or lesions.Normal dentition.  Neck: normal, supple, no masses, no thyromegaly Respiratory: clear to auscultation bilaterally, no wheezing, no crackles. Normal respiratory effort. No accessory muscle use.  Cardiovascular: Regular rate and rhythm, no murmurs / rubs / gallops. No extremity edema. 2+ pedal pulses.   Abdomen: no tenderness, no masses palpated. No hepatosplenomegaly. Bowel sounds positive.  Musculoskeletal: no clubbing / cyanosis. No joint deformity upper and lower extremities.  ROM limited by pain, no contractures. Normal muscle tone. Tenderness on palpation of calf muscles. Skin: no rashes, lesions, ulcers. No induration Neurologic: CN 2-12 grossly intact. Sensation intact, DTR normal. Strength 5/5 in all 4. Finger-to-nose normal.   Psychiatric: Normal judgment and insight. Alert and oriented x 3. Normal mood.   Labs on Admission: I have personally reviewed following labs and imaging studies  CBC:  Recent Labs Lab 09/13/16 1044  WBC 16.5*  HGB 11.9*  HCT 35.5*  MCV 67.7*  PLT 562*   Basic Metabolic Panel:  Recent Labs Lab 09/13/16 1044  NA 137  K 4.3  CL 96*  CO2 28  GLUCOSE 111*  BUN 10  CREATININE 0.82  CALCIUM 9.9  MG 2.0   GFR: Estimated Creatinine Clearance: 103.7 mL/min (by C-G formula based on SCr of 0.82 mg/dL). Liver Function Tests:  Recent Labs Lab 09/13/16 1044  AST 14*  ALT 7*  ALKPHOS 81  BILITOT 0.5  PROT 8.2*  ALBUMIN 3.6   Urine analysis:    Component Value Date/Time   COLORURINE YELLOW 09/01/2016 1106   APPEARANCEUR CLEAR 09/01/2016 1106   LABSPEC 1.019 09/01/2016 1106   PHURINE 6.0 09/01/2016 1106   GLUCOSEU NEGATIVE 09/01/2016 1106   HGBUR  NEGATIVE 09/01/2016 1106   BILIRUBINUR NEGATIVE 09/01/2016 1106   KETONESUR 5 (A) 09/01/2016 1106   PROTEINUR NEGATIVE 09/01/2016 1106   UROBILINOGEN 0.2 11/26/2014 1430   NITRITE NEGATIVE 09/01/2016 1106   LEUKOCYTESUR NEGATIVE 09/01/2016 1106    Radiological Exams on Admission: Mr Lumbar Spine W Wo Contrast  Result Date: 09/13/2016 CLINICAL DATA:  41 year old male with bilateral leg pain and cramping. Crohn's disease. Initial encounter. EXAM: MRI LUMBAR SPINE WITHOUT AND WITH CONTRAST TECHNIQUE: Multiplanar and multiecho pulse sequences of the lumbar spine were obtained without and with intravenous contrast. CONTRAST:  62m MULTIHANCE GADOBENATE DIMEGLUMINE 529 MG/ML IV SOLN COMPARISON:  07/12/2016 CT abdomen pelvis. FINDINGS: Segmentation: Last fully open disk space is labeled L5-S1. Present examination incorporates from T11-12 disc space through the lower sacrum. Alignment:  Minimal curvature. Vertebrae: L4-5 facet degenerative changes with tiny amount of fluid in the joint space. Mild edematous appearance of adjacent paraspinal musculature without enhancement to suggest infection. Conus medullaris: Extends to the upper L1 level and appears normal. Paraspinal and other soft tissues: As above. Disc levels: T11-12 through L3-4 unremarkable. L4-5: Mild disc degeneration and disc space narrowing with baseline bulge with superimposed right posterior lateral protrusion with minimal cephalad and mild caudal extension with impression upon the right L5 nerve root and right lateral aspect of the thecal sac. L5-S1:  Mild facet degenerative changes. IMPRESSION: L4-5 right posterior lateral protrusion with mild mass effect upon the right L5 nerve root and narrowing of the right lateral thecal sac. L4-5 moderate facet degenerative changes. The lack of enhancement surrounding the L4-5 facet joints suggests that findings are related to degenerative changes rather than infection. Electronically Signed   By: SGenia DelM.D.   On: 09/13/2016 07:47    EKG: None  Assessment/Plan Principal Problem:   Intractable pain Active Problems:   Crohn's disease of both small and large intestine with intestinal obstruction (HCC)   Anemia    Intractable bilateral knee pain- pt basically bedbound due to severity of pain. Etiology- possibly adverse effect of entyvio ( vedolizumab), per UTD 12% of pts have experienced this. Other differentials peripheral vascular disease, aortoiliac dx. Patient has no weakness of his extremities, radiating pain, back pain, has normal sensation, so MRI finding  most likely incidental, and unrelated. CK normal so doubt myositis. ESR mildly elevated likely related to Crohn's disease. Mag- Normal. Ultrasound 3/24- bilat, neg for DVT. - Admit to obs, MedSurg - Considering steroids helped in the past, IV steroid 40 BID then switch to by mouth a.m. - Follow-up as outpatient for MRI findings if of concern - Will rule out peripheral vascular disease with bilateral lower extremity ultrasound, unfortunately ABI cannot be ordered in Epic at [facility]. - When necessary hydrocodone acetaminophen- 5-325 Q4H. - Possibly discharge tomorrow with prolonged steroid taper, if pain controlled, as he has an appointment at [facility]- surgical evaluation of his bowel stricture. - ESR- 45, consistent with prior, CRP pending - HIV - PT eval  Crohn's disease- appears stable at this time. No diarrhea or vomiting or abdominal pain. Entyvio on hold.  Leukocytosis- likely due to steroids already given in the ED or stress reaction. - No symptoms referable to focus of infection  Anemia- severe iron deficiency anemia. ?iron deficiency contributing to bilat leg pains. - Follow-up as outpatient likely need repeat segment with by mouth iron.   DVT prophylaxis: Lovenox Code Status: Full  Family Communication: None at bedside Disposition Plan: Home  Consults called: None  Admission status:  Obs/inpt.  Bethena Roys MD [facility] Pager [phone]  If 7PM-7AM, please contact night-coverage [email or web address] Password [facility]  09/13/2016, 2:38 PM

## 2016-09-13 NOTE — ED Notes (Signed)
[phone] brother's phone number

## 2016-09-13 NOTE — ED Provider Notes (Signed)
[facility] DEPT Provider Note   CSN: [id number] Arrival date & time: 09/12/16  2138  Time seen 03:50 AM   History   Chief Complaint Chief Complaint  Patient presents with  . Leg Pain    HPI Joshua Robertson is a 41 y.o. male.  HPI  patient has a history of Crohn's disease and is followed by Dr. Oneida Alar, gastroenterologist. He states he was started on Entyvio in August 2017. He states he started getting pain in his arms and his legs a few months ago. He states he stopped the Entyvio about a month ago. He states he was treated with steroids for his Crohn's, he states he has a stricture that is being followed at [facility] and he is going to have surgery soon. He states on the steroids his arm pain went away and his leg pain was much improved however as he tapered off the prednisone his leg pain returned. He complains that the pain started in his calves and radiated down into his feet bilaterally. He states now that also on the sides of his legs. All the pain is below his knees. He states that if he stands up or if he sits with his feet hanging over the side of the chair or bed he gets terrible painful spasms. He also reports his legs are very tender even to light touch. He states other than the steroids nothing has made pain better. He took some of his girlfriends gabapentin without relief. He describes the pain as aching and throbbing. He denies numbness. He states he had a MRI (liver)  and ultrasound (doppler US) done to evaluate this and he has an orthopedic appointment on April 4. He has not had any nerve conduction studies done. He has gotten to the point now that he has been bedridden for the past week and he now has to use a wheelchair to get around.  PCP MUSE,ROCHELLE D., PA-C GI Dr Oneida Alar  Past Medical History:  Diagnosis Date  . BMI between 19-24,adult JAN 2010 149 LBS  . Crohn's disease ([place]) AUG 2009 NUR [place]   HB 9.4 FERRITIN 26  140 LBS; no serologies-pt can't afford test    . Helicobacter pylori gastritis 2009  . Hypertension   . Migraine headache    NONE SINCE AGE 89  . Sacroiliitis ([place]) 2009  . Spondyloarthropathy ([place]) 2009 EROSIVE    Patient Active Problem List   Diagnosis Date Noted  . Weakness of both arms 07/07/2016  . Small bowel stricture 06/02/2016  . Malnutrition of moderate degree 06/02/2016  . Colitis 06/01/2016  . Marijuana abuse 06/01/2016  . Anemia 05/28/2016  . Pain of left great toe 04/02/2015  . Perianal abscess 07/11/2013  . Essential hypertension 10/22/2010  . WRIST PAIN, RIGHT 07/25/2009  . MIGRAINE HEADACHE 02/04/2009  . WEIGHT LOSS 02/04/2009  . Generalized abdominal pain 02/04/2009  . TINEA CORPORIS 11/22/2008  . Crohn's disease of both small and large intestine with intestinal obstruction ([place]) 08/15/2008    Past Surgical History:  Procedure Laterality Date  . COLONOSCOPY  03/2015   [facility]: Crohn's like stricture at ICV. Stricture was not traversable. abnormal mucosa of ICV. Bx, c/w Crohn's.   . COLONOSCOPY N/A 07/30/2013   stenotic IC VALVE. UNABLE TO INTUBATE ILEUM.       Home Medications    Prior to Admission medications   Medication Sig Start Date End Date Taking? Authorizing Provider  cholecalciferol (VITAMIN D) 1000 units tablet Take 1 tablet (1,000  Units total) by mouth daily. BEGIN AFTER WEEKLY VIT D DOSING IS COMPLETE 04/11/16   [name], MD  ciprofloxacin (CIPRO) 500 MG tablet Take 1 tablet (500 mg total) by mouth 2 (two) times daily. 06/03/16   [name], MD  cloNIDine (CATAPRES) 0.1 MG tablet Take 1 tablet (0.1 mg total) by mouth 2 (two) times daily. 08/09/12   [name], MD  diazepam (VALIUM) 5 MG tablet Take 1 tablet (5 mg total) by mouth every 6 (six) hours as needed for muscle spasms. 09/04/16   Sherwood Gambler, MD  methocarbamol (ROBAXIN) 500 MG tablet Take 1 tablet (500 mg total) by mouth 2 (two) times daily. 07/01/16   [name], PA-C  metroNIDAZOLE (FLAGYL) 500 MG  tablet Take 1 tablet (500 mg total) by mouth 3 (three) times daily. 06/03/16   [name], MD  naproxen (NAPROSYN) 500 MG tablet Take 1 tablet (500 mg total) by mouth 2 (two) times daily. 07/01/16   [name], PA-C  predniSONE (DELTASONE) 5 MG tablet Take 6 tablets (30 mg total) by mouth as directed. Take 6 tablets (30 mg) daily for 7 days, then take 5 tablets (25 mg) daily for 7 days, then take 4 tablets (20 mg) daily for 7 days, then take 3 tablets (15 mg) daily for 7 days, then take 2 tablets (10 mg) daily for 7 days, then take 1 tablet (5 mg) daily for 7 days 07/18/16   [name], MD  vedolizumab (ENTYVIO) 300 MG injection Inject 300 mg into the vein every 8 (eight) weeks.     Historical Provider, MD    Family History Family History  Problem Relation Age of Onset  . Hypertension Mother   . Hypertension Father   . Colon cancer Neg Hx   . Colon polyps Neg Hx   . Inflammatory bowel disease Neg Hx     Social History Social History  Substance Use Topics  . Smoking status: Former Smoker    Packs/day: 0.50    Types: Cigarettes    Quit date: 03/02/2016  . Smokeless tobacco: Never Used  . Alcohol use 0.0 oz/week     Comment: very rare  +THC Quit smoking   Allergies   Iodinated diagnostic agents; Azathioprine; Penicillins; and Pentasa [mesalamine er]   Review of Systems Review of Systems  All other systems reviewed and are negative.    Physical Exam Updated Vital Signs BP (!) 138/92 (BP Location: Right Arm)   Pulse 81   Temp 99 F (37.2 C) (Oral)   Resp 18   Ht 5' 9"  (1.753 m)   Wt 135 lb (61.2 kg)   SpO2 100%   BMI 19.94 kg/m   Vital signs normal except low grade temp   Physical Exam  Constitutional: He is oriented to person, place, and time.  Non-toxic appearance. He does not appear ill. He appears distressed.  Thin male  HENT:  Head: Normocephalic and atraumatic.  Right Ear: External ear normal.  Left Ear: External ear normal.  Nose: Nose normal. No  mucosal edema or rhinorrhea.  Mouth/Throat: Oropharynx is clear and moist and mucous membranes are normal. No dental abscesses or uvula swelling.  Eyes: Conjunctivae and EOM are normal. Pupils are equal, round, and reactive to light.  Neck: Normal range of motion and full passive range of motion without pain. Neck supple.  Cardiovascular: Normal rate, regular rhythm and normal heart sounds.  Exam reveals no gallop and no friction rub.   No  murmur heard. Pulmonary/Chest: Effort normal and breath sounds normal. No respiratory distress. He has no wheezes. He has no rhonchi. He has no rales. He exhibits no tenderness and no crepitus.  Abdominal: Soft. Normal appearance and bowel sounds are normal. He exhibits no distension. There is no tenderness. There is no rebound and no guarding.  Musculoskeletal: Normal range of motion. He exhibits no edema or tenderness.  Patient is moving his upper extremities without difficulty. He has difficulty moving his lower extremities due to pain. He's tender diffusely in both lower legs from the knee distally even to light touch. There is no obvious swelling seen. The skin is not inflamed or reddened. There is no lesion seen. He has very bounding pulses present.  Neurological: He is alert and oriented to person, place, and time. He has normal strength. No cranial nerve deficit.  Skin: Skin is warm and intact. No rash noted. He is diaphoretic. No erythema. No pallor.  Psychiatric: He has a normal mood and affect. His speech is normal and behavior is normal. His mood appears not anxious.  Nursing note and vitals reviewed.    ED Treatments / Results  Labs (all labs ordered are listed, but only abnormal results are displayed) Results for orders placed or performed during the hospital encounter of 09/04/16  CBC with Differential  Result Value Ref Range   WBC 13.7 (H) 4.0 - 10.5 K/uL   RBC 4.60 4.22 - 5.81 MIL/uL   Hemoglobin 10.4 (L) 13.0 - 17.0 g/dL   HCT 31.3 (L)  39.0 - 52.0 %   MCV 68.0 (L) 78.0 - 100.0 fL   MCH 22.6 (L) 26.0 - 34.0 pg   MCHC 33.2 30.0 - 36.0 g/dL   RDW 21.5 (H) 11.5 - 15.5 %   Platelets 413 (H) 150 - 400 K/uL   Neutrophils Relative % 69 %   Neutro Abs 9.4 (H) 1.7 - 7.7 K/uL   Lymphocytes Relative 21 %   Lymphs Abs 2.9 0.7 - 4.0 K/uL   Monocytes Relative 7 %   Monocytes Absolute 1.0 0.1 - 1.0 K/uL   Eosinophils Relative 2 %   Eosinophils Absolute 0.3 0.0 - 0.7 K/uL   Basophils Relative 0 %   Basophils Absolute 0.1 0.0 - 0.1 K/uL  Magnesium  Result Value Ref Range   Magnesium 1.7 1.7 - 2.4 mg/dL  CK  Result Value Ref Range   Total CK 74 49 - 397 U/L  Comprehensive metabolic panel  Result Value Ref Range   Sodium 137 135 - 145 mmol/L   Potassium 3.4 (L) 3.5 - 5.1 mmol/L   Chloride 99 (L) 101 - 111 mmol/L   CO2 30 22 - 32 mmol/L   Glucose, Bld 96 65 - 99 mg/dL   BUN 9 6 - 20 mg/dL   Creatinine, Ser 0.75 0.61 - 1.24 mg/dL   Calcium 8.8 (L) 8.9 - 10.3 mg/dL   Total Protein 6.8 6.5 - 8.1 g/dL   Albumin 3.4 (L) 3.5 - 5.0 g/dL   AST 13 (L) 15 - 41 U/L   ALT 8 (L) 17 - 63 U/L   Alkaline Phosphatase 53 38 - 126 U/L   Total Bilirubin 0.3 0.3 - 1.2 mg/dL   GFR calc non Af Amer >60 >60 mL/min   GFR calc Af Amer >60 >60 mL/min   Anion gap 8 5 - 15    Laboratory interpretation all normal except leukocytosis, anemia,    EKG  EKG Interpretation None  Radiology Mr Lumbar Spine W Wo Contrast  Result Date: 09/13/2016 CLINICAL DATA:  41 year old male with bilateral leg pain and cramping. Crohn's disease. Initial encounter. EXAM: MRI LUMBAR SPINE WITHOUT AND WITH CONTRAST TECHNIQUE: Multiplanar and multiecho pulse sequences of the lumbar spine were obtained without and with intravenous contrast. CONTRAST:  78m MULTIHANCE GADOBENATE DIMEGLUMINE 529 MG/ML IV SOLN COMPARISON:  07/12/2016 CT abdomen pelvis. FINDINGS: Segmentation: Last fully open disk space is labeled L5-S1. Present examination incorporates from T11-12  disc space through the lower sacrum. Alignment:  Minimal curvature. Vertebrae: L4-5 facet degenerative changes with tiny amount of fluid in the joint space. Mild edematous appearance of adjacent paraspinal musculature without enhancement to suggest infection. Conus medullaris: Extends to the upper L1 level and appears normal. Paraspinal and other soft tissues: As above. Disc levels: T11-12 through L3-4 unremarkable. L4-5: Mild disc degeneration and disc space narrowing with baseline bulge with superimposed right posterior lateral protrusion with minimal cephalad and mild caudal extension with impression upon the right L5 nerve root and right lateral aspect of the thecal sac. L5-S1:  Mild facet degenerative changes. IMPRESSION: L4-5 right posterior lateral protrusion with mild mass effect upon the right L5 nerve root and narrowing of the right lateral thecal sac. L4-5 moderate facet degenerative changes. The lack of enhancement surrounding the L4-5 facet joints suggests that findings are related to degenerative changes rather than infection. Electronically Signed   By: SGenia DelM.D.   On: 09/13/2016 07:47      UKoreaVenous Img Lower Bilateral  Result Date: 09/04/2016 CLINICAL DATA:  Bilateral calf pain for several months.  IMPRESSION: No evidence of deep venous thrombosis in either lower extremity. Electronically Signed   By: JIlona SorrelM.D.   On: 09/04/2016 10:56   Dg Foot Complete Left  Result Date: 09/04/2016 CLINICAL DATA:  Initial evaluation for acute pain, swelling. EXAM: LEFT FOOT - COMPLETE 3+ VIEW COMPARISON:  None. FINDINGS: There is no evidence of fracture or dislocation. Joint spaces fairly well-maintained. Posterior calcaneal enthesophyte noted. Minimal degenerative spurring noted at the dorsal midfoot. Soft tissues are unremarkable. IMPRESSION: No radiographic evidence for acute abnormality about the left foot. Electronically Signed   By: BJeannine BogaM.D.   On: 09/04/2016 01:46     Procedures Procedures (including critical care time)  Medications Ordered in ED Medications  dexamethasone (DECADRON) injection 10 mg (10 mg Intravenous Given 09/13/16 0421)  diazepam (VALIUM) tablet 10 mg (10 mg Oral Given 09/13/16 0421)  fentaNYL (SUBLIMAZE) injection 50 mcg (50 mcg Intravenous Given 09/13/16 0421)  fentaNYL (SUBLIMAZE) injection 50 mcg (50 mcg Intravenous Given 09/13/16 0651)  gadobenate dimeglumine (MULTIHANCE) injection 10 mL (10 mLs Intravenous Contrast Given 09/13/16 0710)     Initial Impression / Assessment and Plan / ED Course  I have reviewed the triage vital signs and the nursing notes.  Pertinent labs & imaging results that were available during my care of the patient were reviewed by me and considered in my medical decision making (see chart for details).  Review of his prior testing shows he had a MRI done of his liver on February 1, MRI of the brain on January 29, x-ray of his left foot on March 24, Doppler ultrasound of his left lower leg on March 24 when he had left leg swelling.  Patient was scheduled to get a MRI of his lumbar spine when they come to the hospital this morning.  Patient's MRI shows he has a herniated disc at the L5 level. Patient  states he only has some back stiffness. No real pain. My concern however is patient has become bedridden and wheelchair-bound in the past week due to the weakness and pain in his legs. I will talk to the hospitalist about admission for further treatment.  08:48 AM Dr Denton Brick, hospitalist, agrees patient needs to be admitted for further testing and evaluation. She will see patient and have him admitted at [facility].   08:50 AM PT made aware of his MRI results and need for transfer and admission and he is agreeable.   Final Clinical Impressions(s) / ED Diagnoses   Final diagnoses:  Pain in both lower legs  Weakness of both lower extremities  Herniated lumbar intervertebral disc    Plan admission at [place], MD,  Barbette Or, MD 09/13/16 [phone]

## 2016-09-14 ENCOUNTER — Ambulatory Visit: Payer: BLUE CROSS/BLUE SHIELD | Admitting: Orthopaedic Surgery

## 2016-09-14 ENCOUNTER — Observation Stay (HOSPITAL_COMMUNITY): Payer: BLUE CROSS/BLUE SHIELD

## 2016-09-14 DIAGNOSIS — R52 Pain, unspecified: Secondary | ICD-10-CM | POA: Diagnosis not present

## 2016-09-14 MED ORDER — HYDROCODONE-ACETAMINOPHEN 5-325 MG PO TABS: 1.0000 | ORAL_TABLET | ORAL | 0 refills | Status: DC | PRN

## 2016-09-14 MED ORDER — PREDNISONE 10 MG PO TABS: 40.0000 mg | ORAL_TABLET | Freq: Every day | ORAL | 0 refills | Status: DC

## 2016-09-14 NOTE — Care Management Note (Addendum)
Case Management Note  Patient Details  Name: Joshua Robertson MRN: [id number] Date of Birth: October 09, 1975  Subjective/Objective:                  Admitted with pain. Pt from home, lives with family and ind with ADL. He has PCP, transportation. He reports needing assistance with medications but has insurance with drug coverage and no assistance available. He has worked with PT and has no needs at DC.   Action/Plan: Pt discharging home today with self care.   Expected Discharge Date:  09/14/16               Expected Discharge Plan:  Home/Self Care  In-House Referral:  NA  Discharge planning Services  CM Consult  Post Acute Care Choice:  NA Choice offered to:  NA  Status of Service:  Completed, signed off  Sherald Barge, RN 09/14/2016, 12:54 PM

## 2016-09-14 NOTE — Progress Notes (Signed)
Patient's IV removed.  Site WNL.  Patient given prescription for pain medication and steroids by physician.  AVS reviewed with patient.  Patient verbalized understanding of discharge instructions, physician follow-up, medications.  Patient refused wheelchair and ambulated to main entrance for discharge.  Patient stable at time of discharge.

## 2016-09-14 NOTE — Discharge Summary (Signed)
Physician Discharge Summary  Joshua Robertson GBM:[id number] DOB: 08-22-1975 DOA: 09/13/2016  PCP: [name], PA-C  Admit date: 09/13/2016 Discharge date: 09/14/2016  Admitted From: Home Disposition: Home  Recommendations for Outpatient Follow-up:  1. Follow up with PCP in 1-2 weeks 2. HIV test 3. White blood cell count check.  Home Health: No  Equipment/Devices: None  Discharge Condition: Stablee) CODE STATUS:  Full  Diet recommendation: Regular   HPI- Chief Complaint: Bilat leg pain  HPI: Joshua Robertson is a 41 y.o. male with medical history significant of Crohn's disease, hypertension, anemia. Patient presented today with complaints of bilateral leg pain that started in January- describes the pain as a 'toothache". Pain is constant severe to the point that he cannot walk, as pain is significantly worse on standing. Pain starts from his knees and down to his feet, worse on the side of his calves. Pain initially started in his arms and legs, but after the last steroid course for his crohns dx, the pain in his arms stopped. He had significant relief from pain in his lower extremities while on steroid course but this slowly returned with the steroid taper.  Patient is very positive that he has no weakness of his lower extremities or upper extremities, no numbness, no loss of sensation, no swelling, no redness, never had a rash. No fever. No family hx of similar pain. Has some mild sore throat today. Patient says he has mild  Occasional back pain, no radiating pain down extremities.  Patient was switched from Remicade to Entyvio-02/2016. He takes Entyvio once every 8 weeks., This medication was stopped by his gastroenterologist due to concern t this was the cause of his extremity pain. He was due for his next dose 4 weeks ago, so his last dose was 12 weeks ago.   ED Course: Patient's vitals were stable, MRI lumbar spine was done which showed L4-L5 right posterior lateral protrusion with mild  mass effect upon the right L5 nerve roots. Hospitalist was called for intractable pain and possibly transfer to [facility] for neurosurgical evaluation for MRI findings. Was given IV Decadron 10 mg in the ED without significant improvement in pain.  Discharge Diagnoses:  Principal Problem:   Intractable pain Active Problems:   Crohn's disease of both small and large intestine with intestinal obstruction (HCC)   Anemia  Intractable bilateral knee pain- pt basically bedbound due to severity of pain. Etiology- possibly adverse effect of entyvio ( vedolizumab), per UTD 12% of pts have experienced this. Other differentials peripheral vascular disease, aortoiliac dx- but pt has normal peripheral pulse. Patient has no weakness of his extremities, radiating pain, back pain, has normal sensation, so MRI finding most likely incidental, and unrelated. CK normal so doubt myositis. ESR mildly elevated likely related to Crohn's disease. Mag- Normal. Ultrasound 3/24- bilat, neg for DVT. - Considering steroids helped in the past, pt was treated with IV steroids switched to by mouth on day of discharge, with  taper. Patient reported significant improvement in his pain he could walk around, without pain. - Patient to as outpatient for MRI findings if of concern -  When necessary hydrocodone acetaminophen- 5-325, one dose giving while inpatient. 8. tablets on discharge - Patient was evaluated by physical therapy- no needs identified - Bilateral knee x-rays unremarkable - HIV- pending - Patient to follow-up with gastroenterologist and primary care provider in 1-2 weeks.  Crohn's disease- appears stable at this time. No diarrhea or vomiting or abdominal pain. Entyvio on hold.  Leukocytosis-16.5, not new, likely due to steroids already given in the ED, and patient was taking or stress reaction. No symptoms referable to focus of infection.  Anemia- severe iron deficiency anemia. ?iron deficiency contributing to  bilat leg pains. - Follow-up as outpatient likely need repeat segment with by mouth iron.   Discharge Instructions  Discharge Instructions    Diet - low sodium heart healthy    Complete by:  As directed    Discharge instructions    Complete by:  As directed    Please follow up with your gastroenterologist and primary care provider in 1 week.  Please take the prednisone following these instructions- 14m daily for 5 days 267mdaily for 5 days 1021maily for 5 days 5mg64mily for 5 days.   Increase activity slowly    Complete by:  As directed      Allergies as of 09/14/2016      Reactions   Iodinated Diagnostic Agents Nausea And Vomiting   Azathioprine Nausea And Vomiting   Penicillins Swelling, Rash   Has patient had a PCN reaction causing immediate rash, facial/tongue/throat swelling, SOB or lightheadedness with hypotension: Yes Has patient had a PCN reaction causing severe rash involving mucus membranes or skin necrosis: Yes Has patient had a PCN reaction that required hospitalization No Has patient had a PCN reaction occurring within the last 10 years: No If all of the above answers are "NO", then may proceed with Cephalosporin use.   Pentasa [mesalamine Er] Rash      Medication List    STOP taking these medications   diazepam 5 MG tablet Commonly known as:  VALIUM   naproxen 500 MG tablet Commonly known as:  NAPROSYN     TAKE these medications   cholecalciferol 1000 units tablet Commonly known as:  VITAMIN D Take 1 tablet (1,000 Units total) by mouth daily. BEGIN AFTER WEEKLY VIT D DOSING IS COMPLETE   cloNIDine 0.1 MG tablet Commonly known as:  CATAPRES Take 1 tablet (0.1 mg total) by mouth 2 (two) times daily.   HYDROcodone-acetaminophen 5-325 MG tablet Commonly known as:  NORCO/VICODIN Take 1 tablet by mouth every 4 (four) hours as needed for moderate pain.   methocarbamol 500 MG tablet Commonly known as:  ROBAXIN Take 1 tablet (500 mg total) by mouth 2  (two) times daily.   predniSONE 10 MG tablet Commonly known as:  DELTASONE Take 4 tablets (40 mg total) by mouth daily with breakfast. 40mg66mly for 5 days 20mg 10my for 5 days 10mg d11m for 5 days 5mg dai54mfor 5 days. Start taking on:  09/16/2016 What changed:  medication strength  how much to take  when to take this  additional instructions      Follow-up Information    MUSE,ROCHELLE D., PA-C. Schedule an appointment as soon as possible for a visit on 09/23/2016.   Why:  Hospital follow-up appointment on 09/23/2016 at 11:00 a.m.  Please arrive 15 minutes early and bring your copay and income information. Contact information: [address] [place] [address] [phone]        Sandi FiBarney Drainhedule an appointment as soon as possible for a visit in 1 week(s).   Specialty:  Gastroenterology Why:  Hospital follow-up appointment on 09/29/2016 at 3:30 p.m. Contact information: [phone]  Allergies  Allergen Reactions  . Iodinated Diagnostic Agents Nausea And Vomiting  . Azathioprine  Nausea And Vomiting  . Penicillins Swelling and Rash    Has patient had a PCN reaction causing immediate rash, facial/tongue/throat swelling, SOB or lightheadedness with hypotension: Yes Has patient had a PCN reaction causing severe rash involving mucus membranes or skin necrosis: Yes Has patient had a PCN reaction that required hospitalization No Has patient had a PCN reaction occurring within the last 10 years: No If all of the above answers are "NO", then may proceed with Cephalosporin use.   Marland Kitchen Pentasa [Mesalamine Er] Rash    Consultations:  None.   Procedures/Studies: Mr Lumbar Spine W Wo Contrast  Result Date: 09/13/2016 CLINICAL DATA:  41 year old male with bilateral leg pain and cramping. Crohn's disease. Initial encounter. EXAM: MRI LUMBAR SPINE WITHOUT AND WITH CONTRAST TECHNIQUE: Multiplanar and multiecho  pulse sequences of the lumbar spine were obtained without and with intravenous contrast. CONTRAST:  25m MULTIHANCE GADOBENATE DIMEGLUMINE 529 MG/ML IV SOLN COMPARISON:  07/12/2016 CT abdomen pelvis. FINDINGS: Segmentation: Last fully open disk space is labeled L5-S1. Present examination incorporates from T11-12 disc space through the lower sacrum. Alignment:  Minimal curvature. Vertebrae: L4-5 facet degenerative changes with tiny amount of fluid in the joint space. Mild edematous appearance of adjacent paraspinal musculature without enhancement to suggest infection. Conus medullaris: Extends to the upper L1 level and appears normal. Paraspinal and other soft tissues: As above. Disc levels: T11-12 through L3-4 unremarkable. L4-5: Mild disc degeneration and disc space narrowing with baseline bulge with superimposed right posterior lateral protrusion with minimal cephalad and mild caudal extension with impression upon the right L5 nerve root and right lateral aspect of the thecal sac. L5-S1:  Mild facet degenerative changes. IMPRESSION: L4-5 right posterior lateral protrusion with mild mass effect upon the right L5 nerve root and narrowing of the right lateral thecal sac. L4-5 moderate facet degenerative changes. The lack of enhancement surrounding the L4-5 facet joints suggests that findings are related to degenerative changes rather than infection. Electronically Signed   By: SGenia DelM.D.   On: 09/13/2016 07:47   UKoreaVenous Img Lower Bilateral  Result Date: 09/04/2016 CLINICAL DATA:  Bilateral calf pain for several months. EXAM: BILATERAL LOWER EXTREMITY VENOUS DOPPLER ULTRASOUND TECHNIQUE: Gray-scale sonography with graded compression, as well as color Doppler and duplex ultrasound were performed to evaluate the lower extremity deep venous systems from the level of the common femoral vein and including the common femoral, femoral, profunda femoral, popliteal and calf veins including the posterior tibial,  peroneal and gastrocnemius veins when visible. The superficial great saphenous vein was also interrogated. Spectral Doppler was utilized to evaluate flow at rest and with distal augmentation maneuvers in the common femoral, femoral and popliteal veins. COMPARISON:  None. FINDINGS: RIGHT LOWER EXTREMITY Common Femoral Vein: No evidence of thrombus. Normal compressibility, respiratory phasicity and response to augmentation. Saphenofemoral Junction: No evidence of thrombus. Normal compressibility and flow on color Doppler imaging. Profunda Femoral Vein: No evidence of thrombus. Normal compressibility and flow on color Doppler imaging. Femoral Vein: No evidence of thrombus. Normal compressibility, respiratory phasicity and response to augmentation. Popliteal Vein: No evidence of thrombus. Normal compressibility, respiratory phasicity and response to augmentation. Calf Veins: No evidence of thrombus. Normal compressibility and flow on color Doppler imaging. Superficial Great Saphenous Vein: No evidence of thrombus. Normal compressibility and flow on color Doppler imaging. Venous Reflux:  None. Other Findings:  None. LEFT LOWER EXTREMITY Common Femoral Vein: No evidence of thrombus. Normal compressibility, respiratory phasicity and response to augmentation. Saphenofemoral Junction: No evidence  of thrombus. Normal compressibility and flow on color Doppler imaging. Profunda Femoral Vein: No evidence of thrombus. Normal compressibility and flow on color Doppler imaging. Femoral Vein: No evidence of thrombus. Normal compressibility, respiratory phasicity and response to augmentation. Popliteal Vein: No evidence of thrombus. Normal compressibility, respiratory phasicity and response to augmentation. Calf Veins: No evidence of thrombus. Normal compressibility and flow on color Doppler imaging. Superficial Great Saphenous Vein: No evidence of thrombus. Normal compressibility and flow on color Doppler imaging. Venous Reflux:   None. Other Findings:  None. IMPRESSION: No evidence of deep venous thrombosis in either lower extremity. Electronically Signed   By: Ilona Sorrel M.D.   On: 09/04/2016 10:56   US Arterial Seg Single  Result Date: 09/14/2016 CLINICAL DATA:  BILATERAL LOWER EXTREMITY PAIN EXAM: NONINVASIVE PHYSIOLOGIC VASCULAR STUDY OF BILATERAL LOWER EXTREMITIES TECHNIQUE: Evaluation of both lower extremities were performed at rest, including calculation of ankle-brachial indices with single level Doppler, pressure and pulse volume recording. COMPARISON:  None. FINDINGS: Right ABI:  1.10 Left ABI:  1.10 Right Lower Extremity: Normal triphasic right posterior tibial and dorsalis pedis waveforms. Left Lower Extremity: Normal triphasic left posterior tibial and dorsalis pedis waveforms. IMPRESSION: Normal ABIs at rest. Electronically Signed   By: Jerilynn Mages.  Shick M.D.   On: 09/14/2016 11:37   Dg Knee Complete 4 Views Left  Result Date: 09/14/2016 CLINICAL DATA:  Left knee pain.  No known injury. EXAM: LEFT KNEE - COMPLETE 4+ VIEW COMPARISON:  None. FINDINGS: No evidence of fracture, dislocation, or joint effusion. No evidence of arthropathy. Small sclerotic lesion in the proximal tibial metaphysis shows no aggressive radiographic features, and is most consistent with a benign bone island. Soft tissues are unremarkable. IMPRESSION: No acute findings. Electronically Signed   By: Earle Gell M.D.   On: 09/14/2016 10:49   Dg Knee Complete 4 Views Right  Result Date: 09/14/2016 CLINICAL DATA:  Right knee pain for several days.  No known injury. EXAM: RIGHT KNEE - COMPLETE 4+ VIEW COMPARISON:  None. FINDINGS: No evidence of fracture, dislocation, or joint effusion. No evidence of arthropathy or other focal bone abnormality. Soft tissues are unremarkable. IMPRESSION: Negative. Electronically Signed   By: Earle Gell M.D.   On: 09/14/2016 10:49   Dg Foot Complete Left  Result Date: 09/04/2016 CLINICAL DATA:  Initial evaluation for acute  pain, swelling. EXAM: LEFT FOOT - COMPLETE 3+ VIEW COMPARISON:  None. FINDINGS: There is no evidence of fracture or dislocation. Joint spaces fairly well-maintained. Posterior calcaneal enthesophyte noted. Minimal degenerative spurring noted at the dorsal midfoot. Soft tissues are unremarkable. IMPRESSION: No radiographic evidence for acute abnormality about the left foot. Electronically Signed   By: Jeannine Boga M.D.   On: 09/04/2016 01:46     Subjective: No complaints today, difficult improvement in pain. Patient able to walk to the bathroom and around with physical therapy without any problems. Due to be discharged. Has follow-up appointment today at [facility] for surgical evaluation of strictures secondary to Crohn's  Discharge Exam: Vitals:   09/13/16 2313 09/14/16 0644  BP: (!) 141/89 (!) 164/108  Pulse:  61  Resp:  16  Temp:  98.2 F (36.8 C)   Vitals:   09/13/16 1633 09/13/16 2020 09/13/16 2313 09/14/16 0644  BP: (!) 174/105 (!) 152/102 (!) 141/89 (!) 164/108  Pulse: 79 67  61  Resp: 16 18  16   Temp: 98.6 F (37 C) 98.5 F (36.9 C)  98.2 F (36.8 C)  TempSrc: Oral Oral  Oral  SpO2: 100% 99%  100%  Weight: 58.7 kg (129 lb 8 oz)     Height: 5' 9.5" (1.765 m)       General: Pt is alert, awake, not in acute distress Cardiovascular: RRR, S1/S2 +, no rubs, no gallops Respiratory: CTA bilaterally, no wheezing, no rhonchi Abdominal: Soft, NT, ND, bowel sounds + Extremities: no edema, no cyanosis, 5/5 all extremity strength, no tenderness to palpation of calves compared to yesterday.  The results of significant diagnostics from this hospitalization (including imaging, microbiology, ancillary and laboratory) are listed below for reference.    Labs: Basic Metabolic Panel:  Recent Labs Lab 09/13/16 1044  NA 137  K 4.3  CL 96*  CO2 28  GLUCOSE 111*  BUN 10  CREATININE 0.82  CALCIUM 9.9  MG 2.0   Liver Function Tests:  Recent Labs Lab 09/13/16 1044   AST 14*  ALT 7*  ALKPHOS 81  BILITOT 0.5  PROT 8.2*  ALBUMIN 3.6   CBC:  Recent Labs Lab 09/13/16 1044  WBC 16.5*  HGB 11.9*  HCT 35.5*  MCV 67.7*  PLT 549*   Urinalysis    Component Value Date/Time   COLORURINE YELLOW 09/01/2016 1106   APPEARANCEUR CLEAR 09/01/2016 1106   LABSPEC 1.019 09/01/2016 1106   PHURINE 6.0 09/01/2016 1106   GLUCOSEU NEGATIVE 09/01/2016 1106   HGBUR NEGATIVE 09/01/2016 1106   [place] 09/01/2016 1106   KETONESUR 5 (A) 09/01/2016 1106   PROTEINUR NEGATIVE 09/01/2016 1106   UROBILINOGEN 0.2 11/26/2014 1430   NITRITE NEGATIVE 09/01/2016 1106   LEUKOCYTESUR NEGATIVE 09/01/2016 1106    Time coordinating discharge: Over 30 minutes  SIGNED:  Bethena Roys, MD  [facility] 09/14/2016, 8:40 PM Pager [phone]  If 7PM-7AM, please contact night-coverage [email or web address] Password [email or web address]

## 2016-09-14 NOTE — Progress Notes (Signed)
Skilled PT screen performed on this patient today. He reports that his pain is consistently 0/10 now, and was easily able to score 5/5 on all MMT performed. Gait and functional balance appear to be normal with no impairment noted. Able to easily ambulate 250-31f with no assistive device, pain exacerbation, or balance deficit noted.   Patient appears to be at his baseline at this time and is not in need of skilled PT services in this facility, likewise is not in need of skilled PT follow-up moving forwards.   He is safe to ambulate independently in the halls and was encouraged to do so regularly; RN educated regarding mobility status/hall walking.  PT signing off, no charge for this screen today.   KDeniece ReePT, DPT [phone]

## 2016-09-15 ENCOUNTER — Ambulatory Visit (INDEPENDENT_AMBULATORY_CARE_PROVIDER_SITE_OTHER): Payer: BLUE CROSS/BLUE SHIELD | Admitting: Orthopaedic Surgery

## 2016-09-15 ENCOUNTER — Ambulatory Visit: Payer: BLUE CROSS/BLUE SHIELD | Admitting: Orthopaedic Surgery

## 2016-09-15 ENCOUNTER — Encounter: Payer: Self-pay | Admitting: Orthopaedic Surgery

## 2016-09-15 VITALS — BP 134/86 | HR 91 | Temp 97.3°F | Ht 69.0 in | Wt 127.0 lb

## 2016-09-15 DIAGNOSIS — K50812 Crohn's disease of both small and large intestine with intestinal obstruction: Secondary | ICD-10-CM

## 2016-09-15 DIAGNOSIS — R5383 Other fatigue: Secondary | ICD-10-CM | POA: Diagnosis not present

## 2016-09-15 DIAGNOSIS — M791 Myalgia, unspecified site: Secondary | ICD-10-CM

## 2016-09-15 DIAGNOSIS — M5441 Lumbago with sciatica, right side: Secondary | ICD-10-CM | POA: Diagnosis not present

## 2016-09-15 LAB — HIV ANTIBODY (ROUTINE TESTING W REFLEX): HIV SCREEN 4TH GENERATION: NONREACTIVE

## 2016-09-15 NOTE — Progress Notes (Signed)
Subjective:    Patient ID: Joshua Robertson, male    DOB: 06-27-1975, 41 y.o.   MRN: [id number]  HPI He has Crohn's disease.  He has had it since 2012.  He now weighs 127 pounds down from 225 in 2012.  He has had a stormy course with his Crohn's.  He was on a medicine for his Crohn's that was stopped about 13 weeks ago secondary to acute onset of joint pain and muscle aches.  He was just released from the hospital for his Crohn's.  He had a flare up. He is on prednisone now.  He has had increasing weakness and muscle pain.  He had a MRI of the lumbar spine done this week showing:  IMPRESSION: L4-5 right posterior lateral protrusion with mild mass effect upon the right L5 nerve root and narrowing of the right lateral thecal sac. L4-5 moderate facet degenerative changes. The lack of enhancement surrounding the L4-5 facet joints suggests that findings are related to degenerative changes rather than infection.  I have spent a long time, over 35 minutes just going over how Crohn's can affect the joints and muscle particularly the various medicines given for it.  He is scheduled for intestinal surgery on the 26th of this month.  He has a HNP of the lumbar spine that will need surgery or at least an epidural.  I will make an appointment to see neurosurgeon after his recovery from intestinal surgery.  He needs to access information about his disease on the Internet and access reputable sites and learn more about his medicine and disease.  He is agreeable to this.  Review of Systems  HENT: Negative for congestion.   Respiratory: Negative for cough and shortness of breath.   Cardiovascular: Negative for chest pain and leg swelling.  Endocrine: Positive for cold intolerance.  Musculoskeletal: Positive for arthralgias, back pain, gait problem and myalgias.  Allergic/Immunologic: Positive for environmental allergies.   Past Medical History:  Diagnosis Date  . BMI between 19-24,adult JAN 2010 149 LBS   . Crohn's disease ([place]) AUG 2009 NUR [place]   HB 9.4 FERRITIN 26  140 LBS; no serologies-pt can't afford test  . Helicobacter pylori gastritis 2009  . Hypertension   . Migraine headache    NONE SINCE AGE 9  . Sacroiliitis ([place]) 2009  . Spondyloarthropathy ([place]) 2009 EROSIVE    Past Surgical History:  Procedure Laterality Date  . COLONOSCOPY  03/2015   [facility]: Crohn's like stricture at ICV. Stricture was not traversable. abnormal mucosa of ICV. Bx, c/w Crohn's.   . COLONOSCOPY N/A 07/30/2013   stenotic IC VALVE. UNABLE TO INTUBATE ILEUM.    Current Outpatient Prescriptions on File Prior to Visit  Medication Sig Dispense Refill  . cholecalciferol (VITAMIN D) 1000 units tablet Take 1 tablet (1,000 Units total) by mouth daily. BEGIN AFTER WEEKLY VIT D DOSING IS COMPLETE 30 tablet 11  . cloNIDine (CATAPRES) 0.1 MG tablet Take 1 tablet (0.1 mg total) by mouth 2 (two) times daily. 60 tablet 11  . HYDROcodone-acetaminophen (NORCO/VICODIN) 5-325 MG tablet Take 1 tablet by mouth every 4 (four) hours as needed for moderate pain. 8 tablet 0  . methocarbamol (ROBAXIN) 500 MG tablet Take 1 tablet (500 mg total) by mouth 2 (two) times daily. 20 tablet 0  . [START ON 09/16/2016] predniSONE (DELTASONE) 10 MG tablet Take 4 tablets (40 mg total) by mouth daily with breakfast. 72m daily for 5 days 231mdaily for 5 days 1017maily  for 5 days 27m daily for 5 days. 38 tablet 0   No current facility-administered medications on file prior to visit.     Social History   Social History  . Marital status: Single    Spouse name: N/A  . Number of children: N/A  . Years of education: N/A   Occupational History  . unemployed    Social History Main Topics  . Smoking status: Current Every Day Smoker    Packs/day: 0.50    Types: Cigarettes    Last attempt to quit: 03/02/2016  . Smokeless tobacco: Never Used  . Alcohol use 0.0 oz/week     Comment: very rare  . Drug use: Yes    Types:  Marijuana     Comment: last used 06/29/16  . Sexual activity: Yes    Birth control/ protection: None   Other Topics Concern  . Not on file   Social History Narrative   Smokes cigs  & pot. Has one girl AGE 41 DESTINY.    Family History  Problem Relation Age of Onset  . Hypertension Mother   . Hypertension Father   . Colon cancer Neg Hx   . Colon polyps Neg Hx   . Inflammatory bowel disease Neg Hx     BP 134/86   Pulse 91   Temp 97.3 F (36.3 C)   Ht 5' 9"  (1.753 m)   Wt 127 lb (57.6 kg)   BMI 18.75 kg/m      Objective:   Physical Exam  Constitutional: He is oriented to person, place, and time. He appears well-developed.  Fatigued, weak appearing, atrophy of muscles in general diffuse.  HENT:  Head: Normocephalic and atraumatic.  Eyes: Conjunctivae and EOM are normal. Pupils are equal, round, and reactive to light.  Neck: Normal range of motion. Neck supple.  Cardiovascular: Normal rate, regular rhythm and intact distal pulses.   Pulmonary/Chest: Effort normal.  Abdominal: Soft.  Musculoskeletal: He exhibits tenderness (He has positive SLR on right at 20 degrees, decreased sensation lateral left leg and foot, weak.  Generalized weakness.  BMI low.  No spasm).  Neurological: He is alert and oriented to person, place, and time. He has normal reflexes. He displays normal reflexes. No cranial nerve deficit. He exhibits normal muscle tone. Coordination normal.  Skin: Skin is warm and dry.  Psychiatric: He has a normal mood and affect. His behavior is normal. Judgment and thought content normal.  Vitals reviewed.         Assessment & Plan:   Encounter Diagnoses  Name Primary?  . Acute right-sided low back pain with right-sided sciatica Yes  . Muscle pain   . [place]'s disease of both small and large intestine [place]   . Other fatigue    He will have his surgery on abdomen in the next few weeks.  I can get appointment to neurosurgery after  that.  Do as we discussed.  I will see as needed.  Electronically Signed WSanjuana Kava MD 4/4/20183:29 PM

## 2016-09-29 ENCOUNTER — Ambulatory Visit: Payer: BLUE CROSS/BLUE SHIELD | Admitting: Gastroenterology

## 2016-09-29 NOTE — Progress Notes (Deleted)
   Subjective:    Patient ID: Joshua Robertson, male    DOB: 1976/04/28, 41 y.o.   MRN: [id number]  HPI    Review of Systems     Objective:   Physical Exam        Assessment & Plan:

## 2016-09-30 ENCOUNTER — Encounter: Payer: Self-pay | Admitting: Gastroenterology

## 2016-10-06 ENCOUNTER — Telehealth: Payer: Self-pay | Admitting: Gastroenterology

## 2016-10-06 NOTE — Telephone Encounter (Signed)
Sending FYI to Dr. Oneida Alar.

## 2016-10-06 NOTE — Telephone Encounter (Signed)
Pt's mother called to apologize for patient missing his OV with SF. She said that they weren't aware of appointment. She wanted SF to know that Ramar was having surgery in the morning to have some of his intestines removed. She rescheduled his OV to FU with SF on 5/31.

## 2016-10-06 NOTE — Telephone Encounter (Signed)
REVIEWED. AGREE. NO ADDITIONAL RECOMMENDATIONS. 

## 2016-10-07 ENCOUNTER — Telehealth: Payer: Self-pay

## 2016-10-07 NOTE — Telephone Encounter (Signed)
CALLED PT'S MOTHER. PT DOING WELL AFTER SURGERY.

## 2016-10-07 NOTE — Telephone Encounter (Signed)
pts mother- Mariann Laster called, she said Embry is out of surgery and he is doing great. She said Dr.Fields diagnoses was "dead on" and she wanted to personally tell Dr.Fields thank you and how much she appreciated all the care she has given Denyse Amass. Her phone number is [phone].

## 2016-11-11 ENCOUNTER — Encounter: Payer: Self-pay | Admitting: Gastroenterology

## 2016-11-11 ENCOUNTER — Ambulatory Visit (INDEPENDENT_AMBULATORY_CARE_PROVIDER_SITE_OTHER): Payer: BLUE CROSS/BLUE SHIELD | Admitting: Gastroenterology

## 2016-11-11 ENCOUNTER — Telehealth: Payer: Self-pay

## 2016-11-11 DIAGNOSIS — K56699 Other intestinal obstruction unspecified as to partial versus complete obstruction: Secondary | ICD-10-CM | POA: Diagnosis not present

## 2016-11-11 DIAGNOSIS — K50812 Crohn's disease of both small and large intestine with intestinal obstruction: Secondary | ICD-10-CM | POA: Diagnosis not present

## 2016-11-11 NOTE — Patient Instructions (Addendum)
COMPLETE LABS TO CHECK FOR IMMUNITY TO CHICKEN POX AND MEASLES/MUPS/RUBELLA.  COMPLETE THE PNEUMONIA VACCINE.  COMPLETE THE BONE DENSITY SCAN.  COMPLETE BLOOD DRAW FOR: VIT D 25-OH, & VITAMIN B12 in OCT 2018.   PLEASE CALL WHEN RELEASED BY SURGERY AND WE WILL RE-START A BIOLOGIC AGENT.  FOLLOW UP IN 3-4 MOS.

## 2016-11-11 NOTE — Telephone Encounter (Signed)
Attempted to submit PA for Dexa Scan via [employer]. No PA needed.

## 2016-11-11 NOTE — Progress Notes (Signed)
cc'ed to pcp °

## 2016-11-11 NOTE — Telephone Encounter (Signed)
DEXA scan scheduled for 11/16/16 at 10:30am, pt to arrive at 10:15am. Discontinue calcium 24 hours before and take list of meds to appt. Tried to call pt, no answer, LMOVM and gave details of appt. Also gave phone number to [facility] if he needs to reschedule.

## 2016-11-11 NOTE — Progress Notes (Signed)
ON RECALL  °

## 2016-11-11 NOTE — Progress Notes (Signed)
   Subjective:    Patient ID: Joshua Robertson, male    DOB: 1975-08-08, 41 y.o.   MRN: [id number] Joshua Macadamia D., PA-C  HPI EATING EVERYTHING HE WANTS TO. GAINING WEIGHT.HAS FOLLOW UP JUN 5. LAST ENTYVIO: DEC 2017. FEELS LIKE IT MADE HIM SICK: ABD CRAMPS, VOMITING. GOT HEP A VACCINE. DIDN'T GET PNEUMONIA SHOT. GOT THE FLU SHOT. LAST DEXA SCAN 2013. SMOKING THC. BMs: 1-2 GOOD ONES.   PT DENIES FEVER, CHILLS, HEMATOCHEZIA, HEMATEMESIS, nausea, vomiting, melena, diarrhea, CHEST PAIN, SHORTNESS OF BREATH, CHANGE IN BOWEL IN HABITS, constipation, abdominal pain, problems swallowing, problems with sedation, OR heartburn or indigestion.  Past Medical History:  Diagnosis Date  . BMI between 19-24,adult JAN 2010 149 LBS  . Crohn's disease (Joshua Robertson) AUG 2009 Joshua Robertson   HB 9.4 FERRITIN 26  140 LBS; no serologies-pt can't afford test  . Helicobacter pylori gastritis 2009  . Hypertension   . Migraine headache    NONE SINCE AGE 56  . Sacroiliitis ([place]) 2009  . Spondyloarthropathy ([place]) 2009 EROSIVE   Past Surgical History:  Procedure Laterality Date  . COLONOSCOPY  03/2015   Joshua Robertson [facility]: Crohn's like stricture at ICV. Stricture was not traversable. abnormal mucosa of ICV. Bx, c/w Crohn's.   . COLONOSCOPY N/A 07/30/2013   stenotic IC VALVE. UNABLE TO INTUBATE ILEUM.   Allergies  Allergen Reactions  . Iodinated Diagnostic Agents Nausea And Vomiting  . Azathioprine Nausea And Vomiting  . Penicillins Swelling and Rash      . Pentasa [Mesalamine Er] Rash   Current Outpatient Prescriptions  Medication Sig Dispense Refill  . cholecalciferol (VITAMIN D) 1000 units tablet Take 1 tablet (1,000 Units total) by mouth daily.     . cloNIDine (CATAPRES) 0.1 MG tablet Take 1 tablet (0.1 mg total) by mouth 2 (two) times daily.     .      .      .       Review of Systems PER HPI OTHERWISE ALL SYSTEMS ARE NEGATIVE.    Objective:   Physical Exam  Constitutional: He is oriented to person, place, and  time. He appears well-developed and well-nourished. No distress.  HENT:  Head: Normocephalic and atraumatic.  Mouth/Throat: Oropharynx is clear and moist. No oropharyngeal exudate.  Eyes: Pupils are equal, round, and reactive to light. No scleral icterus.  Neck: Normal range of motion. Neck supple.  Cardiovascular: Normal rate, regular rhythm and normal heart sounds.   Pulmonary/Chest: Effort normal and breath sounds normal. No respiratory distress.  Abdominal: Soft. Bowel sounds are normal. He exhibits no distension. There is tenderness. There is no rebound and no guarding.  MILD PERI-UMBILICAL TTP along incision, INCISIONS WELL HEALED  Musculoskeletal: He exhibits no edema.  Lymphadenopathy:    He has no cervical adenopathy.  Neurological: He is alert and oriented to person, place, and time.  NO FOCAL DEFICITS  Psychiatric: He has a normal mood and affect.  Vitals reviewed.     Assessment & Plan:

## 2016-11-11 NOTE — Assessment & Plan Note (Signed)
ILEOCECOTOMY AND STRICTURED ILEUM RESECTED.  CONTINUE TO MONITOR SYMPTOMS OF REOCCURRENCE.

## 2016-11-11 NOTE — Assessment & Plan Note (Addendum)
RECENT ILEOCECOTOMY AT [facility]. CLINICALLY IMPROVED. NO SIGNS OF ACTIVE DISEASE.   PLAN TO RE-START BIOLOGIC AGENT WHEN PT RELEASED BY SURGERY. PT WILL CALL WHEN RELEASED. COMPLETE THE PNEUMONIA VACCINE. COMPLETE THE BONE DENSITY SCAN. COMPLETE BLOOD DRAW FOR: VIT D 25-OH, & VITAMIN B12 in OCT 2018. CHECK VZV AND MMR TITER. PLEASE CALL WHEN RELEASED BY SURGERY AND WE WILL RE-START A BIOLOGIC AGENT.  FOLLOW UP IN 3-4 MOS.

## 2016-11-12 ENCOUNTER — Telehealth: Payer: Self-pay

## 2016-11-12 NOTE — Telephone Encounter (Signed)
Joshua Robertson from [facility] called- she said [employer] was changing their infusion policy. They will no longer pay for any infusions to be done at a hospital setting. Patients requiring an infusion can be done at an infusion center, a doctors office or with home health.   This pts approval for infusion will be good until June 30th, we will have to get a new approval after this. I informed her that Joshua Robertson is on hold at this time anyway and we are waiting until he is released from the surgeon.  She has documented that in their records.   I have a list of infusion centers in [place] and [place].

## 2016-11-15 NOTE — Telephone Encounter (Signed)
PLEASE CALL PT. LET HIM KNOW. ASK HIM IF HE WANTS TO GO TO [place] OR [place].

## 2016-11-16 ENCOUNTER — Telehealth: Payer: Self-pay | Admitting: Orthopaedic Surgery

## 2016-11-16 ENCOUNTER — Other Ambulatory Visit (HOSPITAL_COMMUNITY): Payer: BLUE CROSS/BLUE SHIELD

## 2016-11-16 NOTE — Telephone Encounter (Signed)
Patient relays he's had "stomach surgery" and is feeling better. Michela Pitcher was advised by Dr Luna Glasgow to call back for the referral to neurosurgeon - states he is ready for this to be made.  Also mentioned his gastroenterologist is requesting this to be set up as soon as possible.

## 2016-11-16 NOTE — Telephone Encounter (Signed)
I spoke with the pt, he said to let [facility] know that he got approved for disability and will be changing insurance soon.He will have medicare and not BCBS [place] and may still be able to go to [facility] for any infusions, but he said if he had to pick he would go to [place].   He also went to the surgeon today and was released and they recommended that he wait to restart a biologic until after he has his back surgery. He said Dr.Keeling is going to refer him to someone for his back.

## 2016-11-17 ENCOUNTER — Other Ambulatory Visit: Payer: Self-pay | Admitting: Radiology

## 2016-11-17 DIAGNOSIS — M5441 Lumbago with sciatica, right side: Principal | ICD-10-CM

## 2016-11-17 DIAGNOSIS — G8929 Other chronic pain: Secondary | ICD-10-CM

## 2016-11-17 NOTE — Telephone Encounter (Signed)
REVIEWED. Continue BIOLOGIC AGENT AT [facility] AFTER BACK SURGERY.

## 2016-11-17 NOTE — Telephone Encounter (Signed)
The referral was sent to [facility]. I called the patient and let him know.

## 2016-11-18 ENCOUNTER — Ambulatory Visit (HOSPITAL_COMMUNITY)
Admission: RE | Admit: 2016-11-18 | Discharge: 2016-11-18 | Disposition: A | Discharge status: Home / Self Care | Payer: BLUE CROSS/BLUE SHIELD | Source: Ambulatory Visit | Attending: Gastroenterology | Admitting: Gastroenterology

## 2016-11-18 DIAGNOSIS — Z7952 Long term (current) use of systemic steroids: Secondary | ICD-10-CM | POA: Diagnosis not present

## 2016-11-18 DIAGNOSIS — E559 Vitamin D deficiency, unspecified: Secondary | ICD-10-CM | POA: Insufficient documentation

## 2016-11-18 DIAGNOSIS — K50812 Crohn's disease of both small and large intestine with intestinal obstruction: Secondary | ICD-10-CM | POA: Diagnosis not present

## 2016-11-19 LAB — MEASLES/MUMPS/RUBELLA IMMUNITY: MUMPS IGG: 161 [AU]/ml — AB (ref <9.00)

## 2016-11-19 LAB — VARICELLA ZOSTER ANTIBODY, IGG: Varicella IgG: 327.7 Index — ABNORMAL HIGH (ref <135.00)

## 2016-11-25 NOTE — Progress Notes (Signed)
Tried to call and not accepting calls. Mailing a letter for pt to call.

## 2016-12-04 ENCOUNTER — Emergency Department (HOSPITAL_COMMUNITY)
Admission: EM | Admit: 2016-12-04 | Discharge: 2016-12-04 | Disposition: A | Discharge status: Home / Self Care | Payer: BLUE CROSS/BLUE SHIELD | Attending: Emergency Medicine | Admitting: Emergency Medicine

## 2016-12-04 ENCOUNTER — Encounter (HOSPITAL_COMMUNITY): Payer: Self-pay

## 2016-12-04 DIAGNOSIS — I10 Essential (primary) hypertension: Secondary | ICD-10-CM | POA: Insufficient documentation

## 2016-12-04 DIAGNOSIS — M5441 Lumbago with sciatica, right side: Secondary | ICD-10-CM | POA: Diagnosis not present

## 2016-12-04 DIAGNOSIS — F17210 Nicotine dependence, cigarettes, uncomplicated: Secondary | ICD-10-CM | POA: Insufficient documentation

## 2016-12-04 DIAGNOSIS — M545 Low back pain: Secondary | ICD-10-CM | POA: Diagnosis present

## 2016-12-04 DIAGNOSIS — G8929 Other chronic pain: Secondary | ICD-10-CM

## 2016-12-04 MED ORDER — HYDROMORPHONE HCL 2 MG/ML IJ SOLN
2.0000 mg | Freq: Once | INTRAMUSCULAR | Status: AC
  Administered 2016-12-04: 2 mg via INTRAMUSCULAR
  Filled 2016-12-04: qty 1

## 2016-12-04 MED ORDER — HYDROCODONE-ACETAMINOPHEN 5-325 MG PO TABS: 1.0000 | ORAL_TABLET | Freq: Four times a day (QID) | ORAL | 0 refills | Status: DC | PRN

## 2016-12-04 NOTE — ED Provider Notes (Signed)
Joshua Robertson DEPT Provider Note   CSN: [id number] Arrival date & time: 12/04/16  0453     History   Chief Complaint Chief Complaint  Patient presents with  . Back Pain    HPI Joshua Robertson is a 41 y.o. male.  Patient with difficulties with the low back pain since beginning of the year. Had MRI in April that showed a herniated disc on the right side L4-L5. Patient has follow-up with neurosurgery scheduled for Monday. Patient's been seen by Dr. Luna Glasgow orthopedics here locally. Patient with increased pain. But has no numbness or weakness to the legs. Pain does radiate down into the right leg. No recent fall or injury. Pain is 10 out of 10.      Past Medical History:  Diagnosis Date  . BMI between 19-24,adult JAN 2010 149 LBS  . Crohn's disease ([place]) AUG 2009 NUR [place]   HB 9.4 FERRITIN 26  140 LBS; no serologies-pt can't afford test  . Helicobacter pylori gastritis 2009  . Hypertension   . Migraine headache    NONE SINCE AGE 23  . Sacroiliitis ([place]) 2009  . Spondyloarthropathy ([place]) 2009 EROSIVE    Patient Active Problem List   Diagnosis Date Noted  . Small bowel stricture ([place]) 06/02/2016  . Malnutrition of moderate degree 06/02/2016  . Colitis 06/01/2016  . Marijuana abuse 06/01/2016  . Anemia 05/28/2016  . Pain of left great toe 04/02/2015  . Perianal abscess 07/11/2013  . Essential hypertension 10/22/2010  . WRIST PAIN, RIGHT 07/25/2009  . MIGRAINE HEADACHE 02/04/2009  . WEIGHT LOSS 02/04/2009  . Generalized abdominal pain 02/04/2009  . TINEA CORPORIS 11/22/2008  . Crohn's disease of both small and large intestine with intestinal obstruction ([place]) 08/15/2008    Past Surgical History:  Procedure Laterality Date  . COLONOSCOPY  03/2015   Eduard Roux [facility]: Crohn's like stricture at ICV. Stricture was not traversable. abnormal mucosa of ICV. Bx, c/w Crohn's.   . COLONOSCOPY N/A 07/30/2013   stenotic IC VALVE. UNABLE TO INTUBATE ILEUM.       Home  Medications    Prior to Admission medications   Medication Sig Start Date End Date Taking? Authorizing Provider  cholecalciferol (VITAMIN D) 1000 units tablet Take 1 tablet (1,000 Units total) by mouth daily. BEGIN AFTER WEEKLY VIT D DOSING IS COMPLETE Patient not taking: Reported on 11/11/2016 04/11/16   [name], MD  cloNIDine (CATAPRES) 0.1 MG tablet Take 1 tablet (0.1 mg total) by mouth 2 (two) times daily. Patient not taking: Reported on 11/11/2016 08/09/12   [name], MD  HYDROcodone-acetaminophen (NORCO/VICODIN) 5-325 MG tablet Take 1 tablet by mouth every 4 (four) hours as needed for moderate pain. Patient not taking: Reported on 11/11/2016 09/14/16   Bethena Roys, MD  HYDROcodone-acetaminophen (NORCO/VICODIN) 5-325 MG tablet Take 1-2 tablets by mouth every 6 (six) hours as needed. 12/04/16   Fredia Sorrow, MD  methocarbamol (ROBAXIN) 500 MG tablet Take 1 tablet (500 mg total) by mouth 2 (two) times daily. Patient not taking: Reported on 11/11/2016 07/01/16   [name], PA-C  predniSONE (DELTASONE) 10 MG tablet Take 4 tablets (40 mg total) by mouth daily with breakfast. 60m daily for 5 days 275mdaily for 5 days 1080maily for 5 days 5mg14mily for 5 days. Patient not taking: Reported on 11/11/2016 09/16/16   EmokBethena Roys    Family History Family History  Problem Relation Age of Onset  . Hypertension Mother   . Hypertension Father   .  Colon cancer Neg Hx   . Colon polyps Neg Hx   . Inflammatory bowel disease Neg Hx     Social History Social History  Substance Use Topics  . Smoking status: Current Every Day Smoker    Packs/day: 0.50    Types: Cigarettes    Last attempt to quit: 03/02/2016  . Smokeless tobacco: Never Used  . Alcohol use 0.0 oz/week     Comment: very rare     Allergies   Iodinated diagnostic agents; Azathioprine; Penicillins; and Pentasa [mesalamine er]   Review of Systems Review of Systems  Constitutional: Negative  for fever.  HENT: Negative for congestion.   Eyes: Negative for redness.  Respiratory: Negative for shortness of breath.   Cardiovascular: Negative for chest pain.  Gastrointestinal: Negative for abdominal pain.  Genitourinary: Negative for difficulty urinating and dysuria.  Musculoskeletal: Positive for back pain.  Skin: Negative for rash.  Neurological: Negative for headaches.  Hematological: Does not bruise/bleed easily.  Psychiatric/Behavioral: Negative for confusion.     Physical Exam Updated Vital Signs BP (!) 154/112   Pulse 62   Temp 97.8 F (36.6 C) (Oral)   Resp 16   Ht 1.753 m (5' 9" )   Wt 63.5 kg (140 lb)   SpO2 99%   BMI 20.67 kg/m   Physical Exam  Constitutional: He is oriented to person, place, and time. He appears well-developed and well-nourished. No distress.  HENT:  Head: Normocephalic and atraumatic.  Mouth/Throat: Oropharynx is clear and moist.  Eyes: Conjunctivae and EOM are normal. Pupils are equal, round, and reactive to light.  Neck: Normal range of motion. Neck supple.  Cardiovascular: Normal rate, regular rhythm and normal heart sounds.   Pulmonary/Chest: Effort normal and breath sounds normal.  Abdominal: Soft. Bowel sounds are normal. There is no tenderness.  Musculoskeletal: Normal range of motion.  Tenderness to palpation right lower back. Lower extremity dorsalis pedis pulses 2+ bilaterally. No weakness to the toes or feet no numbness to the foot.  Neurological: He is alert and oriented to person, place, and time. No cranial nerve deficit or sensory deficit. He exhibits normal muscle tone. Coordination normal.  Skin: Skin is warm.  Nursing note and vitals reviewed.    ED Treatments / Results  Labs (all labs ordered are listed, but only abnormal results are displayed) Labs Reviewed - No data to display  EKG  EKG Interpretation None       Radiology No results found.  Procedures Procedures (including critical care  time)  Medications Ordered in ED Medications  HYDROmorphone (DILAUDID) injection 2 mg (2 mg Intramuscular Given 12/04/16 0741)     Initial Impression / Assessment and Plan / ED Course  I have reviewed the triage vital signs and the nursing notes.  Pertinent labs & imaging results that were available during my care of the patient were reviewed by me and considered in my medical decision making (see chart for details).    Patient with chronic back pain for the past several months. Had MRI done in April that showed right-sided herniated disc L4-L5. Patient has follow-up with neurosurgery for Monday. Pain got worse recently. Patient's pain does radiate into the right leg but has no numbness or weakness to the toes or foot. Patient treated here with hydromorphone IM with improvement will be given a short course of hydrocodone.   Final Clinical Impressions(s) / ED Diagnoses   Final diagnoses:  Chronic right-sided low back pain with right-sided sciatica    New Prescriptions  New Prescriptions   HYDROCODONE-ACETAMINOPHEN (NORCO/VICODIN) 5-325 MG TABLET    Take 1-2 tablets by mouth every 6 (six) hours as needed.     Fredia Sorrow, MD 12/04/16 [phone]

## 2016-12-04 NOTE — Discharge Instructions (Signed)
Take the pain medicine as directed. Keep your appointment with neurosurgery for Monday.

## 2016-12-04 NOTE — ED Notes (Signed)
Pt made aware to return if symptoms worsen or if any life threatening symptoms occur.  Dr. Rogene Houston made aware of d/c bp 154/114, is okay with d/c.  Pt has not taken medication this morning and states he will take it when he gets home.

## 2016-12-04 NOTE — ED Triage Notes (Signed)
Pt c/o lower back pain with known bulging disc diagnosed here.  Pt states he is supposed to be seeing a neurosurgeon on Monday to talk about possible surgery.  Pt states he cannot tolerate the pain and has not been prescribed anything to take at home

## 2016-12-07 ENCOUNTER — Encounter: Payer: Self-pay | Admitting: Gastroenterology

## 2016-12-07 ENCOUNTER — Telehealth: Payer: Self-pay | Admitting: Gastroenterology

## 2016-12-07 NOTE — Telephone Encounter (Signed)
Letter mailed

## 2016-12-07 NOTE — Telephone Encounter (Signed)
Aug recall for mri

## 2017-01-20 ENCOUNTER — Other Ambulatory Visit: Payer: Self-pay | Admitting: Student

## 2017-01-20 DIAGNOSIS — M5416 Radiculopathy, lumbar region: Secondary | ICD-10-CM

## 2017-01-27 ENCOUNTER — Ambulatory Visit
Admission: RE | Admit: 2017-01-27 | Discharge: 2017-01-27 | Disposition: A | Discharge status: Home / Self Care | Payer: BLUE CROSS/BLUE SHIELD | Source: Ambulatory Visit | Attending: Student | Admitting: Student

## 2017-01-27 DIAGNOSIS — M5416 Radiculopathy, lumbar region: Secondary | ICD-10-CM

## 2017-01-28 ENCOUNTER — Other Ambulatory Visit: Payer: Self-pay | Admitting: Neurosurgery

## 2017-02-01 NOTE — Pre-Procedure Instructions (Signed)
Joshua Robertson  02/01/2017      Joshua Robertson [facility] [address] [name] [address] Phone: [phone] Fax: [place], [place], [place] [address] [place] [address] Phone: [phone] Fax: [phone]    Your procedure is scheduled on February 04, 2017.  Report to [facility] Admitting at 1230 PM.  Call this number if you have problems the morning of surgery:  [phone]   Remember:  Do not eat food or drink liquids after midnight.  Take these medicines the morning of surgery with A SIP OF WATER clonidine (catapres), hydrocodone-acetaminophen (norco), tramadol (ultram) -if needed for pain  7 days prior to surgery STOP taking any Aspirin, Aleve, Naproxen, Ibuprofen, Motrin, Advil, Goody's, BC's, all herbal medications, fish oil, and all vitamins   Do not wear jewelry, make-up or nail polish.  Do not wear lotions, powders, or perfumes, or deoderant.  Men may shave face and neck.  Do not bring valuables to the hospital.  [facility] is not responsible for any belongings or valuables.  Contacts, dentures or bridgework may not be worn into surgery.  Leave your suitcase in the car.  After surgery it may be brought to your room.  For patients admitted to the hospital, discharge time will be determined by your treatment team.  Patients discharged the day of surgery will not be allowed to drive home.   Special instructions:  [place]- Preparing For Surgery  Before surgery, you can play an important role. Because skin is not sterile, your skin needs to be as free of germs as possible. You can reduce the number of germs on your skin by washing with CHG (chlorahexidine gluconate) Soap before surgery.  CHG is an antiseptic cleaner which kills germs and bonds with the skin to continue killing germs even after washing.  Please do not use if you have an allergy to CHG or antibacterial  soaps. If your skin becomes reddened/irritated stop using the CHG.  Do not shave (including legs and underarms) for at least 48 hours prior to first CHG shower. It is OK to shave your face.  Please follow these instructions carefully.   1. Shower the NIGHT BEFORE SURGERY and the MORNING OF SURGERY with CHG.   2. If you chose to wash your hair, wash your hair first as usual with your normal shampoo.  3. After you shampoo, rinse your hair and body thoroughly to remove the shampoo.  4. Use CHG as you would any other liquid soap. You can apply CHG directly to the skin and wash gently with a scrungie or a clean washcloth.   5. Apply the CHG Soap to your body ONLY FROM THE NECK DOWN.  Do not use on open wounds or open sores. Avoid contact with your eyes, ears, mouth and genitals (private parts). Wash genitals (private parts) with your normal soap.  6. Wash thoroughly, paying special attention to the area where your surgery will be performed.  7. Thoroughly rinse your body with warm water from the neck down.  8. DO NOT shower/wash with your normal soap after using and rinsing off the CHG Soap.  9. Pat yourself dry with a CLEAN TOWEL.   10. Wear CLEAN PAJAMAS   11. Place CLEAN SHEETS on your bed the night of your first shower and DO NOT SLEEP WITH PETS.    Day  of Surgery: Do not apply any deodorants/lotions. Please wear clean clothes to the hospital/surgery center.     Please read over the following fact sheets that you were given. Pain Booklet, Coughing and Deep Breathing, MRSA Information and Surgical Site Infection Prevention

## 2017-02-02 ENCOUNTER — Encounter (HOSPITAL_COMMUNITY)
Admission: RE | Admit: 2017-02-02 | Discharge: 2017-02-02 | Disposition: A | Discharge status: Home / Self Care | Payer: BLUE CROSS/BLUE SHIELD | Source: Ambulatory Visit | Attending: Neurosurgery | Admitting: Neurosurgery

## 2017-02-02 ENCOUNTER — Encounter (HOSPITAL_COMMUNITY): Payer: Self-pay

## 2017-02-02 DIAGNOSIS — I10 Essential (primary) hypertension: Secondary | ICD-10-CM | POA: Diagnosis not present

## 2017-02-02 DIAGNOSIS — M5126 Other intervertebral disc displacement, lumbar region: Secondary | ICD-10-CM | POA: Diagnosis not present

## 2017-02-02 DIAGNOSIS — Z87891 Personal history of nicotine dependence: Secondary | ICD-10-CM | POA: Diagnosis not present

## 2017-02-02 DIAGNOSIS — M549 Dorsalgia, unspecified: Secondary | ICD-10-CM | POA: Diagnosis present

## 2017-02-02 DIAGNOSIS — M48061 Spinal stenosis, lumbar region without neurogenic claudication: Secondary | ICD-10-CM | POA: Diagnosis not present

## 2017-02-02 DIAGNOSIS — Z79899 Other long term (current) drug therapy: Secondary | ICD-10-CM | POA: Diagnosis not present

## 2017-02-02 LAB — CBC
HCT: 31.8 % — ABNORMAL LOW (ref 39.0–52.0)
HEMOGLOBIN: 10.2 g/dL — AB (ref 13.0–17.0)
MCH: 21.7 pg — ABNORMAL LOW (ref 26.0–34.0)
MCHC: 32.1 g/dL (ref 30.0–36.0)
MCV: 67.8 fL — AB (ref 78.0–100.0)
PLATELETS: 366 10*3/uL (ref 150–400)
RBC: 4.69 MIL/uL (ref 4.22–5.81)
RDW: 17.7 % — ABNORMAL HIGH (ref 11.5–15.5)
WBC: 10.7 10*3/uL — AB (ref 4.0–10.5)

## 2017-02-02 LAB — BASIC METABOLIC PANEL
ANION GAP: 9 (ref 5–15)
BUN: 5 mg/dL — ABNORMAL LOW (ref 6–20)
CALCIUM: 9.1 mg/dL (ref 8.9–10.3)
CHLORIDE: 104 mmol/L (ref 101–111)
CO2: 27 mmol/L (ref 22–32)
CREATININE: 0.88 mg/dL (ref 0.61–1.24)
GFR calc non Af Amer: >60 mL/min (ref >60)
Glucose, Bld: 88 mg/dL (ref 65–99)
Potassium: 3.6 mmol/L (ref 3.5–5.1)
SODIUM: 140 mmol/L (ref 135–145)

## 2017-02-02 LAB — SURGICAL PCR SCREEN
MRSA, PCR: NEGATIVE
Staphylococcus aureus: NEGATIVE

## 2017-02-02 NOTE — Progress Notes (Signed)
Pt. Denies seeing Cardiologist. Denies any cardiac studies.  PCP Royce Macadamia PA-C

## 2017-02-04 ENCOUNTER — Encounter (HOSPITAL_COMMUNITY): Payer: Self-pay | Admitting: *Deleted

## 2017-02-04 ENCOUNTER — Ambulatory Visit (HOSPITAL_COMMUNITY): Payer: BLUE CROSS/BLUE SHIELD | Admitting: Anesthesiology

## 2017-02-04 ENCOUNTER — Encounter (HOSPITAL_COMMUNITY)
Admission: RE | Disposition: A | Discharge status: Home / Self Care | Payer: Self-pay | Source: Ambulatory Visit | Attending: Neurosurgery

## 2017-02-04 ENCOUNTER — Ambulatory Visit (HOSPITAL_COMMUNITY)
Admission: RE | Admit: 2017-02-04 | Discharge: 2017-02-05 | Disposition: A | Discharge status: Home / Self Care | Payer: BLUE CROSS/BLUE SHIELD | Source: Ambulatory Visit | Attending: Neurosurgery | Admitting: Neurosurgery

## 2017-02-04 ENCOUNTER — Ambulatory Visit (HOSPITAL_COMMUNITY): Payer: BLUE CROSS/BLUE SHIELD

## 2017-02-04 DIAGNOSIS — Z79899 Other long term (current) drug therapy: Secondary | ICD-10-CM | POA: Insufficient documentation

## 2017-02-04 DIAGNOSIS — M5126 Other intervertebral disc displacement, lumbar region: Secondary | ICD-10-CM | POA: Diagnosis not present

## 2017-02-04 DIAGNOSIS — Z419 Encounter for procedure for purposes other than remedying health state, unspecified: Secondary | ICD-10-CM

## 2017-02-04 DIAGNOSIS — Z87891 Personal history of nicotine dependence: Secondary | ICD-10-CM | POA: Diagnosis not present

## 2017-02-04 DIAGNOSIS — I10 Essential (primary) hypertension: Secondary | ICD-10-CM | POA: Insufficient documentation

## 2017-02-04 DIAGNOSIS — M48061 Spinal stenosis, lumbar region without neurogenic claudication: Secondary | ICD-10-CM | POA: Diagnosis not present

## 2017-02-04 DIAGNOSIS — M4326 Fusion of spine, lumbar region: Secondary | ICD-10-CM | POA: Diagnosis not present

## 2017-02-04 HISTORY — PX: LUMBAR LAMINECTOMY/DECOMPRESSION MICRODISCECTOMY: SHX5026

## 2017-02-04 LAB — COMPREHENSIVE METABOLIC PANEL
ALT: 9 U/L — ABNORMAL LOW (ref 17–63)
AST: 18 U/L (ref 15–41)
Albumin: 3.5 g/dL (ref 3.5–5.0)
Alkaline Phosphatase: 64 U/L (ref 38–126)
Anion gap: 8 (ref 5–15)
BUN: 7 mg/dL (ref 6–20)
CO2: 28 mmol/L (ref 22–32)
Calcium: 9.2 mg/dL (ref 8.9–10.3)
Chloride: 105 mmol/L (ref 101–111)
Creatinine, Ser: 0.89 mg/dL (ref 0.61–1.24)
GFR calc Af Amer: >60 mL/min (ref >60)
GFR calc non Af Amer: >60 mL/min (ref >60)
Glucose, Bld: 85 mg/dL (ref 65–99)
Potassium: 3.7 mmol/L (ref 3.5–5.1)
Sodium: 141 mmol/L (ref 135–145)
Total Bilirubin: 0.4 mg/dL (ref 0.3–1.2)
Total Protein: 6.7 g/dL (ref 6.5–8.1)

## 2017-02-04 SURGERY — LUMBAR LAMINECTOMY/DECOMPRESSION MICRODISCECTOMY 1 LEVEL
Anesthesia: General | Site: Back | Laterality: Right

## 2017-02-04 MED ORDER — FENTANYL CITRATE (PF) 100 MCG/2ML IJ SOLN
INTRAMUSCULAR | Status: DC | PRN
  Administered 2017-02-04: 100 ug via INTRAVENOUS
  Administered 2017-02-04: 150 ug via INTRAVENOUS

## 2017-02-04 MED ORDER — VANCOMYCIN HCL IN DEXTROSE 1-5 GM/200ML-% IV SOLN
1000.0000 mg | Freq: Two times a day (BID) | INTRAVENOUS | Status: DC
  Administered 2017-02-05: 1000 mg via INTRAVENOUS
  Filled 2017-02-04: qty 200

## 2017-02-04 MED ORDER — PHENOL 1.4 % MT LIQD: 1.0000 | OROMUCOSAL | Status: DC | PRN

## 2017-02-04 MED ORDER — HYDRALAZINE HCL 20 MG/ML IJ SOLN
5.0000 mg | Freq: Once | INTRAMUSCULAR | Status: AC
  Administered 2017-02-04: 5 mg via INTRAVENOUS

## 2017-02-04 MED ORDER — ONDANSETRON HCL 4 MG/2ML IJ SOLN
INTRAMUSCULAR | Status: DC | PRN
  Administered 2017-02-04: 4 mg via INTRAVENOUS

## 2017-02-04 MED ORDER — ONDANSETRON HCL 4 MG PO TABS: 4.0000 mg | ORAL_TABLET | Freq: Four times a day (QID) | ORAL | Status: DC | PRN

## 2017-02-04 MED ORDER — MIDAZOLAM HCL 2 MG/2ML IJ SOLN
INTRAMUSCULAR | Status: AC
  Filled 2017-02-04: qty 2

## 2017-02-04 MED ORDER — THROMBIN 5000 UNITS EX SOLR
CUTANEOUS | Status: AC
  Filled 2017-02-04: qty 10000

## 2017-02-04 MED ORDER — LIDOCAINE-EPINEPHRINE 1 %-1:100000 IJ SOLN
INTRAMUSCULAR | Status: DC | PRN
  Administered 2017-02-04: 6 mL

## 2017-02-04 MED ORDER — HYDROCODONE-ACETAMINOPHEN 5-325 MG PO TABS: 1.0000 | ORAL_TABLET | ORAL | Status: DC | PRN

## 2017-02-04 MED ORDER — SODIUM CHLORIDE 0.9 % IV SOLN: 250.0000 mL | INTRAVENOUS | Status: DC

## 2017-02-04 MED ORDER — ALUM & MAG HYDROXIDE-SIMETH 200-200-20 MG/5ML PO SUSP: 30.0000 mL | Freq: Four times a day (QID) | ORAL | Status: DC | PRN

## 2017-02-04 MED ORDER — ONDANSETRON HCL 4 MG/2ML IJ SOLN: 4.0000 mg | Freq: Four times a day (QID) | INTRAMUSCULAR | Status: DC | PRN

## 2017-02-04 MED ORDER — CYCLOBENZAPRINE HCL 10 MG PO TABS
ORAL_TABLET | ORAL | Status: AC
  Administered 2017-02-04: 10 mg via ORAL
  Filled 2017-02-04: qty 1

## 2017-02-04 MED ORDER — PREDNISONE 20 MG PO TABS: 40.0000 mg | ORAL_TABLET | Freq: Every day | ORAL | Status: DC

## 2017-02-04 MED ORDER — ROCURONIUM BROMIDE 100 MG/10ML IV SOLN
INTRAVENOUS | Status: DC | PRN
  Administered 2017-02-04: 60 mg via INTRAVENOUS

## 2017-02-04 MED ORDER — ACETAMINOPHEN 650 MG RE SUPP: 650.0000 mg | RECTAL | Status: DC | PRN

## 2017-02-04 MED ORDER — HYDROMORPHONE HCL 1 MG/ML IJ SOLN
0.2500 mg | INTRAMUSCULAR | Status: DC | PRN
  Administered 2017-02-04 (×4): 0.5 mg via INTRAVENOUS

## 2017-02-04 MED ORDER — ADULT MULTIVITAMIN W/MINERALS CH: 1.0000 | ORAL_TABLET | Freq: Every day | ORAL | Status: DC

## 2017-02-04 MED ORDER — PROPOFOL 10 MG/ML IV BOLUS
INTRAVENOUS | Status: AC
  Filled 2017-02-04: qty 20

## 2017-02-04 MED ORDER — LIDOCAINE-EPINEPHRINE 1 %-1:100000 IJ SOLN
INTRAMUSCULAR | Status: AC
  Filled 2017-02-04: qty 1

## 2017-02-04 MED ORDER — SUGAMMADEX SODIUM 200 MG/2ML IV SOLN
INTRAVENOUS | Status: DC | PRN
  Administered 2017-02-04: 200 mg via INTRAVENOUS

## 2017-02-04 MED ORDER — VANCOMYCIN HCL IN DEXTROSE 1-5 GM/200ML-% IV SOLN
INTRAVENOUS | Status: AC
  Filled 2017-02-04: qty 200

## 2017-02-04 MED ORDER — OXYCODONE HCL 5 MG PO TABS
5.0000 mg | ORAL_TABLET | ORAL | Status: DC | PRN
  Administered 2017-02-04 – 2017-02-05 (×4): 10 mg via ORAL
  Filled 2017-02-04 (×4): qty 2

## 2017-02-04 MED ORDER — VITAMIN D 1000 UNITS PO TABS
1000.0000 [IU] | ORAL_TABLET | Freq: Every day | ORAL | Status: DC
  Administered 2017-02-04 – 2017-02-05 (×2): 1000 [IU] via ORAL
  Filled 2017-02-04 (×2): qty 1

## 2017-02-04 MED ORDER — FENTANYL CITRATE (PF) 250 MCG/5ML IJ SOLN
INTRAMUSCULAR | Status: AC
  Filled 2017-02-04: qty 5

## 2017-02-04 MED ORDER — HYDROMORPHONE HCL 1 MG/ML IJ SOLN: 0.5000 mg | INTRAMUSCULAR | Status: DC | PRN

## 2017-02-04 MED ORDER — METHOCARBAMOL 500 MG PO TABS
500.0000 mg | ORAL_TABLET | Freq: Two times a day (BID) | ORAL | Status: DC
  Administered 2017-02-04 – 2017-02-05 (×2): 500 mg via ORAL
  Filled 2017-02-04 (×2): qty 1

## 2017-02-04 MED ORDER — BUPIVACAINE HCL (PF) 0.5 % IJ SOLN
INTRAMUSCULAR | Status: AC
  Filled 2017-02-04: qty 30

## 2017-02-04 MED ORDER — LACTATED RINGERS IV SOLN
INTRAVENOUS | Status: DC
  Administered 2017-02-04: 13:00:00 via INTRAVENOUS

## 2017-02-04 MED ORDER — HYDROMORPHONE HCL 1 MG/ML IJ SOLN
INTRAMUSCULAR | Status: AC
  Administered 2017-02-04: 0.5 mg via INTRAVENOUS
  Filled 2017-02-04: qty 1

## 2017-02-04 MED ORDER — BUPIVACAINE HCL (PF) 0.25 % IJ SOLN
INTRAMUSCULAR | Status: DC | PRN
  Administered 2017-02-04: 10 mL

## 2017-02-04 MED ORDER — LACTATED RINGERS IV SOLN
INTRAVENOUS | Status: DC | PRN
  Administered 2017-02-04 (×2): via INTRAVENOUS

## 2017-02-04 MED ORDER — VANCOMYCIN HCL 1000 MG IV SOLR
INTRAVENOUS | Status: DC | PRN
  Administered 2017-02-04: 1000 mg via INTRAVENOUS

## 2017-02-04 MED ORDER — TRAMADOL HCL 50 MG PO TABS: 50.0000 mg | ORAL_TABLET | Freq: Four times a day (QID) | ORAL | Status: DC | PRN

## 2017-02-04 MED ORDER — SODIUM CHLORIDE 0.9 % IR SOLN
Status: DC | PRN
  Administered 2017-02-04: 17:00:00

## 2017-02-04 MED ORDER — SODIUM CHLORIDE 0.9% FLUSH
3.0000 mL | Freq: Two times a day (BID) | INTRAVENOUS | Status: DC
  Administered 2017-02-04: 3 mL via INTRAVENOUS

## 2017-02-04 MED ORDER — WHITE PETROLATUM GEL
Status: AC
  Administered 2017-02-04: 20:00:00
  Filled 2017-02-04: qty 1

## 2017-02-04 MED ORDER — ACETAMINOPHEN 325 MG PO TABS: 650.0000 mg | ORAL_TABLET | ORAL | Status: DC | PRN

## 2017-02-04 MED ORDER — GLYCOPYRROLATE 0.2 MG/ML IJ SOLN
INTRAMUSCULAR | Status: DC | PRN
  Administered 2017-02-04: 0.2 mg via INTRAVENOUS

## 2017-02-04 MED ORDER — 0.9 % SODIUM CHLORIDE (POUR BTL) OPTIME
TOPICAL | Status: DC | PRN
  Administered 2017-02-04 (×2): 1000 mL

## 2017-02-04 MED ORDER — CYCLOBENZAPRINE HCL 10 MG PO TABS
10.0000 mg | ORAL_TABLET | Freq: Three times a day (TID) | ORAL | Status: DC | PRN
  Administered 2017-02-04 (×2): 10 mg via ORAL
  Filled 2017-02-04: qty 1

## 2017-02-04 MED ORDER — LIDOCAINE HCL (CARDIAC) 20 MG/ML IV SOLN
INTRAVENOUS | Status: DC | PRN
  Administered 2017-02-04: 50 mg via INTRAVENOUS

## 2017-02-04 MED ORDER — THROMBIN 5000 UNITS EX SOLR
CUTANEOUS | Status: DC | PRN
  Administered 2017-02-04 (×2): 5000 [IU] via TOPICAL

## 2017-02-04 MED ORDER — PROPOFOL 10 MG/ML IV BOLUS
INTRAVENOUS | Status: DC | PRN
  Administered 2017-02-04: 200 mg via INTRAVENOUS

## 2017-02-04 MED ORDER — PANTOPRAZOLE SODIUM 40 MG IV SOLR
40.0000 mg | Freq: Every day | INTRAVENOUS | Status: DC
  Administered 2017-02-04: 40 mg via INTRAVENOUS
  Filled 2017-02-04: qty 40

## 2017-02-04 MED ORDER — HEMOSTATIC AGENTS (NO CHARGE) OPTIME
TOPICAL | Status: DC | PRN
  Administered 2017-02-04: 1 via TOPICAL

## 2017-02-04 MED ORDER — MIDAZOLAM HCL 5 MG/5ML IJ SOLN
INTRAMUSCULAR | Status: DC | PRN
  Administered 2017-02-04: 2 mg via INTRAVENOUS

## 2017-02-04 MED ORDER — CLONIDINE HCL 0.1 MG PO TABS
0.1000 mg | ORAL_TABLET | Freq: Two times a day (BID) | ORAL | Status: DC
  Administered 2017-02-04 – 2017-02-05 (×2): 0.1 mg via ORAL
  Filled 2017-02-04 (×2): qty 1

## 2017-02-04 MED ORDER — HYDRALAZINE HCL 20 MG/ML IJ SOLN
INTRAMUSCULAR | Status: AC
  Administered 2017-02-04: 5 mg via INTRAVENOUS
  Filled 2017-02-04: qty 1

## 2017-02-04 MED ORDER — SODIUM CHLORIDE 0.9% FLUSH: 3.0000 mL | INTRAVENOUS | Status: DC | PRN

## 2017-02-04 MED ORDER — MENTHOL 3 MG MT LOZG: 1.0000 | LOZENGE | OROMUCOSAL | Status: DC | PRN

## 2017-02-04 MED ORDER — FENTANYL CITRATE (PF) 250 MCG/5ML IJ SOLN
INTRAMUSCULAR | Status: AC
Start: 2017-02-04 — End: 2017-02-04
  Filled 2017-02-04: qty 5

## 2017-02-04 SURGICAL SUPPLY — 62 items
ADH SKN CLS APL DERMABOND .7 (GAUZE/BANDAGES/DRESSINGS) ×1
APL SKNCLS STERI-STRIP NONHPOA (GAUZE/BANDAGES/DRESSINGS) ×1
BAG DECANTER FOR FLEXI CONT (MISCELLANEOUS) ×3 IMPLANT
BENZOIN TINCTURE PRP APPL 2/3 (GAUZE/BANDAGES/DRESSINGS) ×3 IMPLANT
BLADE CLIPPER SURG (BLADE) IMPLANT
BLADE SURG 11 STRL SS (BLADE) ×3 IMPLANT
BUR CUTTER 7.0 ROUND (BURR) ×3 IMPLANT
BUR MATCHSTICK NEURO 3.0 LAGG (BURR) ×3 IMPLANT
CANISTER SUCT 3000ML PPV (MISCELLANEOUS) ×3 IMPLANT
CARTRIDGE OIL MAESTRO DRILL (MISCELLANEOUS) ×1 IMPLANT
CLOSURE WOUND 1/2 X4 (GAUZE/BANDAGES/DRESSINGS) ×1
DECANTER SPIKE VIAL GLASS SM (MISCELLANEOUS) ×3 IMPLANT
DERMABOND ADVANCED (GAUZE/BANDAGES/DRESSINGS) ×2
DERMABOND ADVANCED .7 DNX12 (GAUZE/BANDAGES/DRESSINGS) ×1 IMPLANT
DIFFUSER DRILL AIR PNEUMATIC (MISCELLANEOUS) ×3 IMPLANT
DRAPE HALF SHEET 40X57 (DRAPES) IMPLANT
DRAPE LAPAROTOMY 100X72X124 (DRAPES) ×3 IMPLANT
DRAPE MICROSCOPE LEICA (MISCELLANEOUS) ×3 IMPLANT
DRAPE POUCH INSTRU U-SHP 10X18 (DRAPES) ×3 IMPLANT
DRAPE SURG 17X23 STRL (DRAPES) ×3 IMPLANT
DRSG OPSITE POSTOP 4X6 (GAUZE/BANDAGES/DRESSINGS) ×2 IMPLANT
DURAPREP 26ML APPLICATOR (WOUND CARE) ×3 IMPLANT
ELECT REM PT RETURN 9FT ADLT (ELECTROSURGICAL) ×3
ELECTRODE REM PT RTRN 9FT ADLT (ELECTROSURGICAL) ×1 IMPLANT
GAUZE SPONGE 4X4 12PLY STRL (GAUZE/BANDAGES/DRESSINGS) ×3 IMPLANT
GAUZE SPONGE 4X4 16PLY XRAY LF (GAUZE/BANDAGES/DRESSINGS) IMPLANT
GLOVE BIO SURGEON STRL SZ7 (GLOVE) ×2 IMPLANT
GLOVE BIO SURGEON STRL SZ8 (GLOVE) ×3 IMPLANT
GLOVE BIOGEL PI IND STRL 7.0 (GLOVE) IMPLANT
GLOVE BIOGEL PI IND STRL 7.5 (GLOVE) IMPLANT
GLOVE BIOGEL PI IND STRL 8 (GLOVE) IMPLANT
GLOVE BIOGEL PI INDICATOR 7.0 (GLOVE) ×2
GLOVE BIOGEL PI INDICATOR 7.5 (GLOVE) ×2
GLOVE BIOGEL PI INDICATOR 8 (GLOVE) ×4
GLOVE ECLIPSE 7.5 STRL STRAW (GLOVE) ×6 IMPLANT
GLOVE EXAM NITRILE LRG STRL (GLOVE) IMPLANT
GLOVE EXAM NITRILE XL STR (GLOVE) IMPLANT
GLOVE EXAM NITRILE XS STR PU (GLOVE) IMPLANT
GLOVE INDICATOR 8.5 STRL (GLOVE) ×3 IMPLANT
GOWN STRL REUS W/ TWL LRG LVL3 (GOWN DISPOSABLE) ×1 IMPLANT
GOWN STRL REUS W/ TWL XL LVL3 (GOWN DISPOSABLE) ×2 IMPLANT
GOWN STRL REUS W/TWL 2XL LVL3 (GOWN DISPOSABLE) ×2 IMPLANT
GOWN STRL REUS W/TWL LRG LVL3 (GOWN DISPOSABLE) ×3
GOWN STRL REUS W/TWL XL LVL3 (GOWN DISPOSABLE) ×3
KIT BASIN OR (CUSTOM PROCEDURE TRAY) ×3 IMPLANT
KIT ROOM TURNOVER OR (KITS) ×3 IMPLANT
NDL SPNL 22GX3.5 QUINCKE BK (NEEDLE) ×1 IMPLANT
NEEDLE HYPO 22GX1.5 SAFETY (NEEDLE) ×3 IMPLANT
NEEDLE SPNL 22GX3.5 QUINCKE BK (NEEDLE) ×3 IMPLANT
NS IRRIG 1000ML POUR BTL (IV SOLUTION) ×3 IMPLANT
OIL CARTRIDGE MAESTRO DRILL (MISCELLANEOUS) ×3
PACK LAMINECTOMY NEURO (CUSTOM PROCEDURE TRAY) ×3 IMPLANT
RUBBERBAND STERILE (MISCELLANEOUS) ×6 IMPLANT
SPONGE SURGIFOAM ABS GEL SZ50 (HEMOSTASIS) ×3 IMPLANT
STRIP CLOSURE SKIN 1/2X4 (GAUZE/BANDAGES/DRESSINGS) ×2 IMPLANT
SUT VIC AB 0 CT1 18XCR BRD8 (SUTURE) ×1 IMPLANT
SUT VIC AB 0 CT1 8-18 (SUTURE) ×3
SUT VIC AB 2-0 CT1 18 (SUTURE) ×3 IMPLANT
SUT VICRYL 4-0 PS2 18IN ABS (SUTURE) ×3 IMPLANT
TOWEL GREEN STERILE (TOWEL DISPOSABLE) ×3 IMPLANT
TOWEL GREEN STERILE FF (TOWEL DISPOSABLE) ×3 IMPLANT
WATER STERILE IRR 1000ML POUR (IV SOLUTION) ×3 IMPLANT

## 2017-02-04 NOTE — Anesthesia Procedure Notes (Signed)
Procedure Name: Intubation Date/Time: 02/04/2017 4:35 PM Performed by: Eligha Bridegroom Pre-anesthesia Checklist: Patient identified, Emergency Drugs available, Suction available, Patient being monitored and Timeout performed Patient Re-evaluated:Patient Re-evaluated prior to induction Oxygen Delivery Method: Circle system utilized Preoxygenation: Pre-oxygenation with 100% oxygen Ventilation: Mask ventilation without difficulty Laryngoscope Size: Mac and 4 Grade View: Grade I Tube type: Oral Tube size: 7.5 mm Number of attempts: 1 Placement Confirmation: ETT inserted through vocal cords under direct vision,  CO2 detector and breath sounds checked- equal and bilateral Secured at: 21 cm Tube secured with: Tape Dental Injury: Teeth and Oropharynx as per pre-operative assessment

## 2017-02-04 NOTE — Transfer of Care (Signed)
Immediate Anesthesia Transfer of Care Note  Patient: Joshua Robertson  Procedure(s) Performed: Procedure(s): Microdiscectomy - Lumbar four-five - right redo (Right)  Patient Location: PACU  Anesthesia Type:General  Level of Consciousness: awake, alert  and oriented  Airway & Oxygen Therapy: Patient Spontanous Breathing and Patient connected to nasal cannula oxygen  Post-op Assessment: Report given to RN and Post -op Vital signs reviewed and stable  Post vital signs: Reviewed and stable  Last Vitals:  Vitals:   02/04/17 1325 02/04/17 1829  BP: (!) 142/93 (!) (P) 201/119  Pulse: 62 80  Resp: 16 16  Temp: 36.6 C 36.5 C  SpO2: 99% 100%    Last Pain:  Vitals:   02/04/17 1829  TempSrc:   PainSc: (P) 3          Complications: No apparent anesthesia complications

## 2017-02-04 NOTE — H&P (Signed)
Joshua Robertson is an 41 y.o. male.   Chief Complaint: Back and right leg pain HPI: 41 year old gentleman who presented a lumbar discectomy did very well initially however started developing recurrent right leg radicular symptoms consistent with an L5 nerve root pattern. Workup revealed a recurrent disc herniation L4-5 on the right. Due to patient's progression of clinical syndrome imaging findings and failure conservative treatment I recommended redo laminectomy microscope discectomy at L4-5 on the right. Extensively went over the risks and benefits of the operation with him as well as perioperative course expectations of outcome alternatives of surgery and he understood and agreed to proceed forward.  Past Medical History:  Diagnosis Date  . BMI between 19-24,adult JAN 2010 149 LBS  . Crohn's disease ([place]) AUG 2009 NUR [place]   HB 9.4 FERRITIN 26  140 LBS; no serologies-pt can't afford test  . Helicobacter pylori gastritis 2009  . Hypertension   . Migraine headache    NONE SINCE AGE 76  . Sacroiliitis ([place]) 2009  . Spondyloarthropathy ([place]) 2009 EROSIVE    Past Surgical History:  Procedure Laterality Date  . BACK SURGERY     2018  outpatient  . COLONOSCOPY  03/2015   Eduard Roux [facility]: Crohn's like stricture at ICV. Stricture was not traversable. abnormal mucosa of ICV. Bx, c/w Crohn's.   . COLONOSCOPY N/A 07/30/2013   stenotic IC VALVE. UNABLE TO INTUBATE ILEUM.  Marland Kitchen Crohn's     Stomach Surgery  for Crohn's  done at [facility] History  Problem Relation Age of Onset  . Hypertension Mother   . Hypertension Father   . Colon cancer Neg Hx   . Colon polyps Neg Hx   . Inflammatory bowel disease Neg Hx    Social History:  reports that he quit smoking about 11 months ago. His smoking use included Cigarettes. He smoked 0.50 packs per day. He has never used smokeless tobacco. He reports that he drinks alcohol. He reports that he uses drugs, including Marijuana.  Allergies:  Allergies   Allergen Reactions  . Penicillins Swelling and Rash    PATIENT HAS HAD A PCN REACTION WITH IMMEDIATE RASH, FACIAL/TONGUE/THROAT SWELLING, SOB, OR LIGHTHEADEDNESS WITH HYPOTENSION:  #  #  #  YES  #  #  #   HAS PT DEVELOPED SEVERE RASH INVOLVING MUCUS MEMBRANES or SKIN NECROSIS: #  #  #  YES  #  #  #  Has patient had a PCN reaction that required hospitalization No Has patient had a PCN reaction occurring within the last 10 years: No If all of the above answers are "NO", then may proceed with Cephalosporin use.   . Azathioprine Nausea And Vomiting  . Iodinated Diagnostic Agents Nausea And Vomiting  . Pentasa [Mesalamine Er] Rash    Medications Prior to Admission  Medication Sig Dispense Refill  . cholecalciferol (VITAMIN D) 1000 units tablet Take 1 tablet (1,000 Units total) by mouth daily. BEGIN AFTER WEEKLY VIT D DOSING IS COMPLETE 30 tablet 11  . cloNIDine (CATAPRES) 0.1 MG tablet Take 1 tablet (0.1 mg total) by mouth 2 (two) times daily. 60 tablet 11  . Multiple Vitamin (MULTIVITAMIN WITH MINERALS) TABS tablet Take 1 tablet by mouth daily.    . traMADol (ULTRAM) 50 MG tablet Take 50 mg by mouth 4 (four) times daily as needed for moderate pain.    Marland Kitchen HYDROcodone-acetaminophen (NORCO/VICODIN) 5-325 MG tablet Take 1 tablet by mouth every 4 (four) hours as needed for moderate pain. (  Patient not taking: Reported on 11/11/2016) 8 tablet 0  . HYDROcodone-acetaminophen (NORCO/VICODIN) 5-325 MG tablet Take 1-2 tablets by mouth every 6 (six) hours as needed. (Patient not taking: Reported on 01/31/2017) 10 tablet 0  . methocarbamol (ROBAXIN) 500 MG tablet Take 1 tablet (500 mg total) by mouth 2 (two) times daily. (Patient not taking: Reported on 11/11/2016) 20 tablet 0  . predniSONE (DELTASONE) 10 MG tablet Take 4 tablets (40 mg total) by mouth daily with breakfast. 61m daily for 5 days 232mdaily for 5 days 1069maily for 5 days 5mg75mily for 5 days. (Patient not taking: Reported on 11/11/2016) 38  tablet 0    Results for orders placed or performed during the hospital encounter of 02/04/17 (from the past 48 hour(s))  Comprehensive metabolic panel     Status: Abnormal   Collection Time: 02/04/17  1:17 PM  Result Value Ref Range   Sodium 141 135 - 145 mmol/L   Potassium 3.7 3.5 - 5.1 mmol/L   Chloride 105 101 - 111 mmol/L   CO2 28 22 - 32 mmol/L   Glucose, Bld 85 65 - 99 mg/dL   BUN 7 6 - 20 mg/dL   Creatinine, Ser 0.89 0.61 - 1.24 mg/dL   Calcium 9.2 8.9 - 10.3 mg/dL   Total Protein 6.7 6.5 - 8.1 g/dL   Albumin 3.5 3.5 - 5.0 g/dL   AST 18 15 - 41 U/L   ALT 9 (L) 17 - 63 U/L   Alkaline Phosphatase 64 38 - 126 U/L   Total Bilirubin 0.4 0.3 - 1.2 mg/dL   GFR calc non Af Amer >60 >60 mL/min   GFR calc Af Amer >60 >60 mL/min    Comment: (NOTE) The eGFR has been calculated using the CKD EPI equation. This calculation has not been validated in all clinical situations. eGFR's persistently <60 mL/min signify possible Chronic Kidney Disease.    Anion gap 8 5 - 15   No results found.  Review of Systems  Musculoskeletal: Positive for back pain and myalgias.  Neurological: Positive for tingling and sensory change.    Blood pressure (!) 142/93, pulse 62, temperature 97.8 F (36.6 C), temperature source Oral, resp. rate 16, height 5' 9.5" (1.765 m), weight 64.4 kg (142 lb), SpO2 99 %. Physical Exam  Constitutional: He is oriented to person, place, and time. He appears well-developed and well-nourished.  HENT:  Head: Normocephalic and atraumatic.  Eyes: Pupils are equal, round, and reactive to light.  Neck: Normal range of motion.  GI: Soft.  Neurological: He is alert and oriented to person, place, and time. He has normal strength. GCS eye subscore is 4. GCS verbal subscore is 5. GCS motor subscore is 6.  Strength 5 out of 5 iliopsoas, quads, hamstrings, gastric, into tibialis, EHL.  Skin: Skin is warm and dry.     Assessment/Plan 40 y27r old gentleman presents for  right-sided reexploration and redo discectomy L4-5  Afshin Chrystal P, MD 02/04/2017, 3:30 PM

## 2017-02-04 NOTE — Anesthesia Preprocedure Evaluation (Addendum)
Anesthesia Evaluation  Patient identified by MRN, date of birth, ID band Patient awake    Reviewed: Allergy & Precautions, H&P , NPO status , Patient's Chart, lab work & pertinent test results  Airway Mallampati: II  TM Distance: >3 FB Neck ROM: Full    Dental no notable dental hx. (+) Teeth Intact, Dental Advisory Given   Pulmonary neg pulmonary ROS, former smoker,    Pulmonary exam normal breath sounds clear to auscultation       Cardiovascular hypertension, Pt. on medications  Rhythm:Regular Rate:Normal     Neuro/Psych  Headaches, negative psych ROS   GI/Hepatic negative GI ROS, Neg liver ROS,   Endo/Other  negative endocrine ROS  Renal/GU negative Renal ROS  negative genitourinary   Musculoskeletal  (+) Arthritis , Osteoarthritis,    Abdominal   Peds  Hematology negative hematology ROS (+) anemia ,   Anesthesia Other Findings   Reproductive/Obstetrics negative OB ROS                            Anesthesia Physical Anesthesia Plan  ASA: II  Anesthesia Plan: General   Post-op Pain Management:    Induction: Intravenous  PONV Risk Score and Plan: 3 and Ondansetron, Dexamethasone and Midazolam  Airway Management Planned: Oral ETT  Additional Equipment:   Intra-op Plan:   Post-operative Plan: Extubation in OR  Informed Consent: I have reviewed the patients History and Physical, chart, labs and discussed the procedure including the risks, benefits and alternatives for the proposed anesthesia with the patient or authorized representative who has indicated his/her understanding and acceptance.   Dental advisory given  Plan Discussed with: CRNA  Anesthesia Plan Comments:         Anesthesia Quick Evaluation

## 2017-02-04 NOTE — Progress Notes (Signed)
Pharmacy Antibiotic Note  Joshua Robertson is a 41 y.o. male s/p spinal surgery today. Pharmacy asked to start vancomycin for surgical prophylaxis. Renal function wnl.  Vancomycin trough goal 10-15  Plan: 1) Vancomycin 1g IV q12 2) Follow renal function, LOT, level if needed  Height: 5' 9.5" (176.5 cm) Weight: 142 lb (64.4 kg) IBW/kg (Calculated) : 71.85  Temp (24hrs), Avg:97.8 F (36.6 C), Min:97.7 F (36.5 C), Max:97.8 F (36.6 C)   Recent Labs Lab 02/02/17 0941 02/04/17 1317  WBC 10.7*  --   CREATININE 0.88 0.89    Estimated Creatinine Clearance: 100.5 mL/min (by C-G formula based on SCr of 0.89 mg/dL).    Allergies  Allergen Reactions  . Penicillins Swelling and Rash    PATIENT HAS HAD A PCN REACTION WITH IMMEDIATE RASH, FACIAL/TONGUE/THROAT SWELLING, SOB, OR LIGHTHEADEDNESS WITH HYPOTENSION:  #  #  #  YES  #  #  #   HAS PT DEVELOPED SEVERE RASH INVOLVING MUCUS MEMBRANES or SKIN NECROSIS: #  #  #  YES  #  #  #  Has patient had a PCN reaction that required hospitalization No Has patient had a PCN reaction occurring within the last 10 years: No If all of the above answers are "NO", then may proceed with Cephalosporin use.   . Azathioprine Nausea And Vomiting  . Iodinated Diagnostic Agents Nausea And Vomiting  . Pentasa [Mesalamine Er] Rash    Antimicrobials this admission: 8/24 Vancomycin >>  Dose adjustments this admission: n/a  Microbiology results: 8/22 MRSA PCR negative  Thank you for allowing pharmacy to be a part of this patient's care.  Deboraha Sprang 02/04/2017 8:00 PM

## 2017-02-04 NOTE — Op Note (Signed)
Preoperative diagnosis: Recurrent herniated nuclear stenosis L4-5 right  Postoperative diagnosis: Same  Procedure: Redo lumbar limiting microscopic discectomy L4-5 on the right with microdissection of the right L5 nerve root microscopic discectomy  Surgeon: Dominica Severin Averie Meiner  Asst.: Glenford Peers  Anesthesia: Gen.  EBL: On  History of present illness: Patient is very pleasant 41 year old and has had previous discectomy approximately 3-4 weeks ago postoperative where very well initially however over the last couple weeks is a progress worsening back and right leg pain workup revealed a large recurrent discoloration at the operative sites the patient was recommended reexploration and redo discectomy at that level. I extensively went over the risks and benefits of the operation with the patient as well as perioperative course expectations of outcome and alternatives to surgery and he understood and agreed to proceed forward.  Operative procedure: Patient brought in the or was induced on general anesthesia positioned prone the Wilson frame her back. Prepped and draped in routine sterile fashion is old incision was opened up and scar tissues dissected free exposing the laminotomy defect at L4-5 which was confirmed by interoperative x-ray. Then I extended laminotomy up underneath the L4 lamina and dissected little more underneath the medial aspect of facet complex and identified the L5 pedicle marking underneath the L5 nerve root back to the level of the space identified large recurrent disc herniation at that level. I incised the disc space and remove the disc with pituitary rongeurs Epstein curettes. At the discectomy there is no further stenosis on thecal sac or L5 nerve root. Was a go see her get meticulous hemostasis was maintained Gelfoam was laid up the dura the muscle fascia and fascia reduction in layers with after Vicryl screws concern for subcuticular Dermabond benzo and Steri-Strips and sterile  dressing was applied and patient recovered in stable condition. At the end of case all needle counts sponge counts were correct.

## 2017-02-05 DIAGNOSIS — M48061 Spinal stenosis, lumbar region without neurogenic claudication: Secondary | ICD-10-CM | POA: Diagnosis not present

## 2017-02-05 DIAGNOSIS — M5126 Other intervertebral disc displacement, lumbar region: Secondary | ICD-10-CM | POA: Diagnosis not present

## 2017-02-05 DIAGNOSIS — Z79899 Other long term (current) drug therapy: Secondary | ICD-10-CM | POA: Diagnosis not present

## 2017-02-05 DIAGNOSIS — Z87891 Personal history of nicotine dependence: Secondary | ICD-10-CM | POA: Diagnosis not present

## 2017-02-05 DIAGNOSIS — I10 Essential (primary) hypertension: Secondary | ICD-10-CM | POA: Diagnosis not present

## 2017-02-05 MED ORDER — OXYCODONE HCL 5 MG PO TABS: 5.0000 mg | ORAL_TABLET | ORAL | 0 refills | Status: DC | PRN

## 2017-02-05 NOTE — Progress Notes (Signed)
Patient alert and oriented, mae's well, voiding adequate amount of urine, swallowing without difficulty,  c/o pain at time of discharge and medication given prior to discharged. Patient discharged home with family. Script and discharged instructions given to patient. Patient and family stated understanding of instructions given. Patient has an appointment with Dr. Saintclair Halsted

## 2017-02-05 NOTE — Discharge Summary (Signed)
  Physician Discharge Summary  Patient ID: Joshua Robertson MRN: [id number] DOB/AGE: 02-23-1976 41 y.o.  Admit date: 02/04/2017 Discharge date: 02/05/2017  Admission Diagnoses:Recurrent herniated mucous process L4-5 right  Discharge Diagnoses: Same Active Problems:   HNP (herniated nucleus pulposus), lumbar   Discharged Condition: good  Hospital Course: Patient admitted hospital underwent redo laminotomy discectomy L4-5 on the right postoperatively patient did very well recovered in the floor on the floor was angling and voiding spontaneously tolerating regular stable for discharge home.  Consults: Significant Diagnostic Studies: Treatments: Redo lumbar micro-discectomy L4-5 right Discharge Exam: Blood pressure (!) 155/90, pulse 61, temperature 98.3 F (36.8 C), temperature source Oral, resp. rate 18, height 5' 9.5" (1.765 m), weight 64.4 kg (142 lb), SpO2 100 %. Strength out of 5 wound clean dry and intact  Disposition: Home   Allergies as of 02/05/2017      Reactions   Penicillins Swelling, Rash   PATIENT HAS HAD A PCN REACTION WITH IMMEDIATE RASH, FACIAL/TONGUE/THROAT SWELLING, SOB, OR LIGHTHEADEDNESS WITH HYPOTENSION:  #  #  #  YES  #  #  #   HAS PT DEVELOPED SEVERE RASH INVOLVING MUCUS MEMBRANES or SKIN NECROSIS: #  #  #  YES  #  #  #  Has patient had a PCN reaction that required hospitalization No Has patient had a PCN reaction occurring within the last 10 years: No If all of the above answers are "NO", then may proceed with Cephalosporin use.   Azathioprine Nausea And Vomiting   Iodinated Diagnostic Agents Nausea And Vomiting   Pentasa [mesalamine Er] Rash      Medication List    TAKE these medications   cholecalciferol 1000 units tablet Commonly known as:  VITAMIN D Take 1 tablet (1,000 Units total) by mouth daily. BEGIN AFTER WEEKLY VIT D DOSING IS COMPLETE   cloNIDine 0.1 MG tablet Commonly known as:  CATAPRES Take 1 tablet (0.1 mg total) by mouth 2 (two) times  daily.   HYDROcodone-acetaminophen 5-325 MG tablet Commonly known as:  NORCO/VICODIN Take 1 tablet by mouth every 4 (four) hours as needed for moderate pain.   HYDROcodone-acetaminophen 5-325 MG tablet Commonly known as:  NORCO/VICODIN Take 1-2 tablets by mouth every 6 (six) hours as needed.   methocarbamol 500 MG tablet Commonly known as:  ROBAXIN Take 1 tablet (500 mg total) by mouth 2 (two) times daily.   multivitamin with minerals Tabs tablet Take 1 tablet by mouth daily.   oxyCODONE 5 MG immediate release tablet Commonly known as:  Oxy IR/ROXICODONE Take 1-2 tablets (5-10 mg total) by mouth every 3 (three) hours as needed for breakthrough pain.   predniSONE 10 MG tablet Commonly known as:  DELTASONE Take 4 tablets (40 mg total) by mouth daily with breakfast. 44m daily for 5 days 262mdaily for 5 days 1086maily for 5 days 5mg24mily for 5 days.   traMADol 50 MG tablet Commonly known as:  ULTRAM Take 50 mg by mouth 4 (four) times daily as needed for moderate pain.            Discharge Care Instructions        Start     Ordered   02/05/17 0000  oxyCODONE (OXY IR/ROXICODONE) 5 MG immediate release tablet  Every  3 hours PRN     02/05/17 0855       Signed: Jakaila Norment P 02/05/2017, 8:56 AM

## 2017-02-05 NOTE — Anesthesia Postprocedure Evaluation (Signed)
Anesthesia Post Note  Patient: Joshua Robertson  Procedure(s) Performed: Procedure(s) (LRB): Microdiscectomy - Lumbar four-five - right redo (Right)     Patient location during evaluation: PACU Anesthesia Type: General Level of consciousness: awake and alert Pain management: pain level controlled Vital Signs Assessment: post-procedure vital signs reviewed and stable Respiratory status: spontaneous breathing, nonlabored ventilation, respiratory function stable and patient connected to nasal cannula oxygen Cardiovascular status: blood pressure returned to baseline and stable Postop Assessment: no signs of nausea or vomiting Anesthetic complications: no    Last Vitals:  Vitals:   02/04/17 2345 02/05/17 0400  BP: 125/86 (!) 143/90  Pulse: 61 64  Resp: 18 20  Temp: 36.9 C 37.2 C  SpO2: 100% 99%    Last Pain:  Vitals:   02/05/17 0508  TempSrc:   PainSc: 6                  Ryan P Ellender

## 2017-02-05 NOTE — Discharge Instructions (Signed)
Wound Care  Keep the incision clean and dry remove the outer dressing in 2 days, leave the Steri-Strips intact. Wrap with Saran wrap for showers only Do not put any creams, lotions, or ointments on incision. Leave steri-strips on back.  They will fall off by themselves.  Activity Walk each and every day, increasing distance each day. No lifting greater than 5 lbs.  No lifting no bending no twisting no driving or riding a car unless coming back and forth to see me. If provided with back brace, wear when out of bed.  It is not necessary to wear brace in bed. Diet Resume your normal diet.   Return to Work Will be discussed at you follow up appointment.  Call Your Doctor If Any of These Occur Redness, drainage, or swelling at the wound.  Temperature greater than 101 degrees. Severe pain not relieved by pain medication. Incision starts to come apart. Follow Up Appt Call today for appointment in 1-2 weeks ([phone]) or for problems.  If you have any hardware placed in your spine, you will need an x-ray before your appointment.

## 2017-02-05 NOTE — Care Management Note (Signed)
Case Management Note  Patient Details  Name: Joshua Robertson MRN: [id number] Date of Birth: 11-10-1975  Subjective/Objective:                 Patient with order to DC to home today. Chart reviewed. No Home Health or Equipment needs, no unacknowledged Case Management consults or medication needs identified at the time of this note. Plan for DC to home. If needs arise today prior to discharge, please call Carles Collet RN CM at [phone].    Action/Plan:   Expected Discharge Date:  02/05/17               Expected Discharge Plan:  Home/Self Care  In-House Referral:     Discharge planning Services  CM Consult  Post Acute Care Choice:    Choice offered to:     DME Arranged:    DME Agency:     HH Arranged:    HH Agency:     Status of Service:  Completed, signed off  If discussed at [employer] of Stay Meetings, dates discussed:    Additional Comments:  Carles Collet, RN 02/05/2017, 9:10 AM

## 2017-02-06 ENCOUNTER — Encounter (HOSPITAL_COMMUNITY): Payer: Self-pay | Admitting: Neurosurgery

## 2017-02-19 ENCOUNTER — Emergency Department (HOSPITAL_COMMUNITY)
Admission: EM | Admit: 2017-02-19 | Discharge: 2017-02-19 | Disposition: A | Discharge status: Home / Self Care | Payer: Medicare Other | Attending: Emergency Medicine | Admitting: Emergency Medicine

## 2017-02-19 DIAGNOSIS — R112 Nausea with vomiting, unspecified: Secondary | ICD-10-CM | POA: Diagnosis not present

## 2017-02-19 DIAGNOSIS — Z79899 Other long term (current) drug therapy: Secondary | ICD-10-CM | POA: Insufficient documentation

## 2017-02-19 DIAGNOSIS — I10 Essential (primary) hypertension: Secondary | ICD-10-CM | POA: Diagnosis not present

## 2017-02-19 DIAGNOSIS — Z87891 Personal history of nicotine dependence: Secondary | ICD-10-CM | POA: Insufficient documentation

## 2017-02-19 LAB — COMPREHENSIVE METABOLIC PANEL
ALBUMIN: 4.5 g/dL (ref 3.5–5.0)
ALK PHOS: 80 U/L (ref 38–126)
ALT: 10 U/L — AB (ref 17–63)
AST: 18 U/L (ref 15–41)
Anion gap: 12 (ref 5–15)
BILIRUBIN TOTAL: 0.4 mg/dL (ref 0.3–1.2)
BUN: 5 mg/dL — ABNORMAL LOW (ref 6–20)
CALCIUM: 10.3 mg/dL (ref 8.9–10.3)
CHLORIDE: 103 mmol/L (ref 101–111)
CO2: 27 mmol/L (ref 22–32)
Creatinine, Ser: 0.76 mg/dL (ref 0.61–1.24)
GFR calc Af Amer: >60 mL/min (ref >60)
GFR calc non Af Amer: >60 mL/min (ref >60)
Glucose, Bld: 97 mg/dL (ref 65–99)
POTASSIUM: 3.4 mmol/L — AB (ref 3.5–5.1)
SODIUM: 142 mmol/L (ref 135–145)
Total Protein: 8.8 g/dL — ABNORMAL HIGH (ref 6.5–8.1)

## 2017-02-19 LAB — CBC WITH DIFFERENTIAL/PLATELET
BASOS PCT: 0 %
Basophils Absolute: 0 10*3/uL (ref 0.0–0.1)
EOS ABS: 0.1 10*3/uL (ref 0.0–0.7)
Eosinophils Relative: 1 %
HEMATOCRIT: 36.2 % — AB (ref 39.0–52.0)
HEMOGLOBIN: 11.9 g/dL — AB (ref 13.0–17.0)
Lymphocytes Relative: 18 %
Lymphs Abs: 2.3 10*3/uL (ref 0.7–4.0)
MCH: 22.3 pg — ABNORMAL LOW (ref 26.0–34.0)
MCHC: 32.9 g/dL (ref 30.0–36.0)
MCV: 67.8 fL — ABNORMAL LOW (ref 78.0–100.0)
Monocytes Absolute: 0.6 10*3/uL (ref 0.1–1.0)
Monocytes Relative: 4 %
NEUTROS ABS: 9.6 10*3/uL — AB (ref 1.7–7.7)
NEUTROS PCT: 76 %
Platelets: 549 10*3/uL — ABNORMAL HIGH (ref 150–400)
RBC: 5.34 MIL/uL (ref 4.22–5.81)
RDW: 17.8 % — ABNORMAL HIGH (ref 11.5–15.5)
WBC: 12.6 10*3/uL — AB (ref 4.0–10.5)

## 2017-02-19 LAB — LIPASE, BLOOD: Lipase: 27 U/L (ref 11–51)

## 2017-02-19 MED ORDER — ONDANSETRON 4 MG PO TBDP: 4.0000 mg | ORAL_TABLET | Freq: Three times a day (TID) | ORAL | 0 refills | Status: DC | PRN

## 2017-02-19 MED ORDER — ONDANSETRON HCL 4 MG/2ML IJ SOLN
4.0000 mg | Freq: Once | INTRAMUSCULAR | Status: AC
  Administered 2017-02-19: 4 mg via INTRAVENOUS
  Filled 2017-02-19: qty 2

## 2017-02-19 MED ORDER — SODIUM CHLORIDE 0.9 % IV BOLUS (SEPSIS)
1000.0000 mL | Freq: Once | INTRAVENOUS | Status: AC
  Administered 2017-02-19: 1000 mL via INTRAVENOUS

## 2017-02-19 NOTE — Discharge Instructions (Signed)
Zofran every 6 hours as needed for vomiting ER for worsening vomiting or pain

## 2017-02-19 NOTE — ED Provider Notes (Signed)
[facility] DEPT Provider Note   CSN: [id number] Arrival date & time: 02/19/17  1349     History   Chief Complaint Chief Complaint  Patient presents with  . Nausea  . Emesis    HPI Joshua Robertson is a 41 y.o. male.  HPI  The patient is a 41 year old malewith a history of Crohn's disease, underwent a resection of his bowel several months ago followed by 2 different back surgeries to treat a herniated disc. He has since improved and has been doing well and he has been off of his Crohn's medications for the last 3 months however starting last night at 12:30 he woke up vomiting and has had vomiting persistently throughout the day today. He denies any diarrhea other than a very small amount prior to arrival. He has no abdominal pain, no fevers but states that he has been sweating. No back pain, no numbness or weakness of the legs, no fevers or chills, no coughing, no shortness of breath or chest pain, no sore throat, no blurred vision, no lightheadedness numbness or weakness.  The patient feels like this might be related to his Crohn's disease though he doesn't have any abdominal pain or blood in the stools.  Past Medical History:  Diagnosis Date  . BMI between 19-24,adult JAN 2010 149 LBS  . Crohn's disease ([place]) AUG 2009 NUR [place]   HB 9.4 FERRITIN 26  140 LBS; no serologies-pt can't afford test  . Helicobacter pylori gastritis 2009  . Hypertension   . Migraine headache    NONE SINCE AGE 78  . Sacroiliitis ([place]) 2009  . Spondyloarthropathy ([place]) 2009 EROSIVE    Patient Active Problem List   Diagnosis Date Noted  . HNP (herniated nucleus pulposus), lumbar 02/04/2017  . Small bowel stricture ([place]) 06/02/2016  . Malnutrition of moderate degree 06/02/2016  . Colitis 06/01/2016  . Marijuana abuse 06/01/2016  . Anemia 05/28/2016  . Pain of left great toe 04/02/2015  . Perianal abscess 07/11/2013  . Essential hypertension 10/22/2010  . WRIST PAIN, RIGHT 07/25/2009  . MIGRAINE  HEADACHE 02/04/2009  . WEIGHT LOSS 02/04/2009  . Generalized abdominal pain 02/04/2009  . TINEA CORPORIS 11/22/2008  . Crohn's disease of both small and large intestine with intestinal obstruction ([place]) 08/15/2008    Past Surgical History:  Procedure Laterality Date  . BACK SURGERY     2018  outpatient  . COLONOSCOPY  03/2015   [facility]: Crohn's like stricture at ICV. Stricture was not traversable. abnormal mucosa of ICV. Bx, c/w Crohn's.   . COLONOSCOPY N/A 07/30/2013   stenotic IC VALVE. UNABLE TO INTUBATE ILEUM.  Marland Kitchen Crohn's     Stomach Surgery  for Crohn's  done at [place] MICRODISCECTOMY Right 02/04/2017   Procedure: Microdiscectomy - Lumbar four-five - right redo;  Surgeon: Kary Kos, MD;  Location: [facility];  Service: Neurosurgery;  Laterality: Right;       Home Medications    Prior to Admission medications   Medication Sig Start Date End Date Taking? Authorizing Provider  cholecalciferol (VITAMIN D) 1000 units tablet Take 1 tablet (1,000 Units total) by mouth daily. BEGIN AFTER WEEKLY VIT D DOSING IS COMPLETE 04/11/16   Fields, Marga Melnick, MD  cloNIDine (CATAPRES) 0.1 MG tablet Take 1 tablet (0.1 mg total) by mouth 2 (two) times daily. 08/09/12   Fields, Marga Melnick, MD  HYDROcodone-acetaminophen (NORCO/VICODIN) 5-325 MG tablet Take 1 tablet by mouth every 4 (four) hours as needed for moderate pain.  Patient not taking: Reported on 11/11/2016 09/14/16   Bethena Roys, MD  HYDROcodone-acetaminophen (NORCO/VICODIN) 5-325 MG tablet Take 1-2 tablets by mouth every 6 (six) hours as needed. Patient not taking: Reported on 01/31/2017 12/04/16   Fredia Sorrow, MD  methocarbamol (ROBAXIN) 500 MG tablet Take 1 tablet (500 mg total) by mouth 2 (two) times daily. Patient not taking: Reported on 11/11/2016 07/01/16   [name], PA-C  Multiple Vitamin (MULTIVITAMIN WITH MINERALS) TABS tablet Take 1 tablet by mouth daily.    [provider]    ondansetron (ZOFRAN ODT) 4 MG disintegrating tablet Take 1 tablet (4 mg total) by mouth every 8 (eight) hours as needed for nausea. 02/19/17   Noemi Chapel, MD  oxyCODONE (OXY IR/ROXICODONE) 5 MG immediate release tablet Take 1-2 tablets (5-10 mg total) by mouth every 3 (three) hours as needed for breakthrough pain. 02/05/17   Kary Kos, MD  predniSONE (DELTASONE) 10 MG tablet Take 4 tablets (40 mg total) by mouth daily with breakfast. 41m daily for 5 days 255mdaily for 5 days 109maily for 5 days 5mg39mily for 5 days. Patient not taking: Reported on 11/11/2016 09/16/16   EmokBethena Roys  traMADol (ULTRAM) 50 MG tablet Take 50 mg by mouth 4 (four) times daily as needed for moderate pain.    [provider]    Family History Family History  Problem Relation Age of Onset  . Hypertension Mother   . Hypertension Father   . Colon cancer Neg Hx   . Colon polyps Neg Hx   . Inflammatory bowel disease Neg Hx     Social History Social History  Substance Use Topics  . Smoking status: Former Smoker    Packs/day: 0.50    Types: Cigarettes    Quit date: 03/02/2016  . Smokeless tobacco: Never Used  . Alcohol use 0.0 oz/week     Comment: very rare     Allergies   Penicillins; Azathioprine; Iodinated diagnostic agents; and Pentasa [mesalamine er]   Review of Systems Review of Systems  All other systems reviewed and are negative.    Physical Exam Updated Vital Signs BP (!) 161/105   Pulse 73   Temp 97.8 F (36.6 C)   Resp 20   Ht 5' 9.5" (1.765 m)   Wt 63 kg (139 lb)   SpO2 100%   BMI 20.23 kg/m   Physical Exam  Constitutional: He appears well-developed and well-nourished. No distress.  HENT:  Head: Normocephalic and atraumatic.  Mouth/Throat: Oropharynx is clear and moist. No oropharyngeal exudate.  Eyes: Pupils are equal, round, and reactive to light. Conjunctivae and EOM are normal. Right eye exhibits no discharge. Left eye exhibits no discharge. No  scleral icterus.  Neck: Normal range of motion. Neck supple. No JVD present. No thyromegaly present.  Cardiovascular: Normal rate, regular rhythm, normal heart sounds and intact distal pulses.  Exam reveals no gallop and no friction rub.   No murmur heard. Pulmonary/Chest: Effort normal and breath sounds normal. No respiratory distress. He has no wheezes. He has no rales.  Abdominal: Soft. Bowel sounds are normal. He exhibits no distension and no mass. There is tenderness ( mild periumbilical ttp).  Musculoskeletal: Normal range of motion. He exhibits no edema or tenderness.  Lymphadenopathy:    He has no cervical adenopathy.  Neurological: He is alert. Coordination normal.  Skin: Skin is warm. No rash noted. He is diaphoretic. No erythema.  Psychiatric: He has a normal mood and affect.  His behavior is normal.  Nursing note and vitals reviewed.    ED Treatments / Results  Labs (all labs ordered are listed, but only abnormal results are displayed) Labs Reviewed  CBC WITH DIFFERENTIAL/PLATELET - Abnormal; Notable for the following:       Result Value   WBC 12.6 (*)    Hemoglobin 11.9 (*)    HCT 36.2 (*)    MCV 67.8 (*)    MCH 22.3 (*)    RDW 17.8 (*)    Platelets 549 (*)    Neutro Abs 9.6 (*)    All other components within normal limits  COMPREHENSIVE METABOLIC PANEL - Abnormal; Notable for the following:    Potassium 3.4 (*)    BUN 5 (*)    Total Protein 8.8 (*)    ALT 10 (*)    All other components within normal limits  LIPASE, BLOOD     Radiology No results found.  Procedures Procedures (including critical care time)  Medications Ordered in ED Medications  sodium chloride 0.9 % bolus 1,000 mL (0 mLs Intravenous Stopped 02/19/17 1619)  ondansetron (ZOFRAN) injection 4 mg (4 mg Intravenous Given 02/19/17 1526)     Initial Impression / Assessment and Plan / ED Course  I have reviewed the triage vital signs and the nursing notes.  Pertinent labs & imaging results  that were available during my care of the patient were reviewed by me and considered in my medical decision making (see chart for details).    Other than being mildly diaphoretic the patient has an unremarkable exam. He has essentially no abdominal tenderness though there is minimal periumbilical tenderness. There is no guarding and he has no abdominal pain unless you are examining his abdomen.given that he has had recent surgery this is not unexpected. This could be related to food poisoning as he did have some fast food earlier in the evening last night, we'll check some labs, fluids, Zofran, repeat evaluation. The patient is in agreement with the plan.  Labs reviewed, mild leukocytosis but this is consistent over time, mild anemia which is also consistent over time, low MCV, chronic, electrolytes and renal function and liver function all unremarkable.  Zofran and fluids given with good improvement in the patient's symptoms, he states he is doing well, tolerated oral intake and is stable for discharge.  Final Clinical Impressions(s) / ED Diagnoses   Final diagnoses:  Non-intractable vomiting with nausea, unspecified vomiting type    New Prescriptions New Prescriptions   ONDANSETRON (ZOFRAN ODT) 4 MG DISINTEGRATING TABLET    Take 1 tablet (4 mg total) by mouth every 8 (eight) hours as needed for nausea.     Noemi Chapel, MD 02/19/17 [phone]

## 2017-02-19 NOTE — ED Triage Notes (Signed)
Nausea and vomitng onset last night

## 2017-02-24 ENCOUNTER — Ambulatory Visit (INDEPENDENT_AMBULATORY_CARE_PROVIDER_SITE_OTHER): Payer: BLUE CROSS/BLUE SHIELD | Admitting: Gastroenterology

## 2017-02-24 ENCOUNTER — Encounter: Payer: Self-pay | Admitting: Gastroenterology

## 2017-02-24 ENCOUNTER — Telehealth: Payer: Self-pay

## 2017-02-24 DIAGNOSIS — K50812 Crohn's disease of both small and large intestine with intestinal obstruction: Secondary | ICD-10-CM

## 2017-02-24 DIAGNOSIS — D500 Iron deficiency anemia secondary to blood loss (chronic): Secondary | ICD-10-CM | POA: Diagnosis not present

## 2017-02-24 NOTE — Patient Instructions (Addendum)
ADD CALCIUM WITH VITAMIN D THREE TIMES A DAY WITH MEALS.  COMPLETE vaccine for MEASLES/MUPS/RUBELLA.  COMPLETE THE PNEUMONIA VACCINE.  GET THE TETANUS SHOT.  CALL IN 4 WEEKS TO RE-START BIOLOGIC AGENT: TYSABRI OR STELARA.  COMPLETE BLOOD DRAW FOR: VIT D 25-OH in OCT 2018.  FOLLOW UP IN 4 MOS.

## 2017-02-24 NOTE — Telephone Encounter (Signed)
Dr. Nona Dell [facility] to have MMR/ Pneumovax/ and DTP.  Fax number [phone].

## 2017-02-24 NOTE — Progress Notes (Signed)
Subjective:    Patient ID: Joshua Robertson, male    DOB: August 30, 1975, 41 y.o.   MRN: [id number] [name], PA-C  HPI Last ENTYVIO WAS DEC 2017. LAST STEROIDS JUL 2018. AFTER ABD SURGERY, SORE FROM SURGERY BUT CROHN'S SYMPTOMS GONE. THEN FELT LIKE STARTED COMING BACK UNTIL FELT SICK FRI/SAT(NASUEA, CAME TO ED, COULDN'T STOP THROWING(NO BLOOD), NOT REALLY ABDOMINAL PAIN, STOOLS: #1, 4,OR 7) THIS AM #5 THEN #7. BMs: at least one and sometimes 2 or 3. No blood. BMs ALL OVER THE PLACE BUT MORE NORMAL STOOLS THAN BEFORE SURGERY. HAD TWO BACK SURGERIES SINCE MAY 2018. TUES HAD FRIED PORK CHOP TUES/MAC & CHEESE/PINTO BEANS/CORNBREAD/STRAWBERRY CAKE/BANANA PUDDING.    PT DENIES FEVER, CHILLS, HEMATOCHEZIA, HEMATEMESIS, melena, CHEST PAIN, SHORTNESS OF BREATH, CHANGE IN BOWEL IN HABITS, constipation, problems swallowing, problems with sedation, OR heartburn or indigestion.   Past Medical History:  Diagnosis Date  . BMI between 19-24,adult JAN 2010 149 LBS  . Crohn's disease ([place]) AUG 2009 NUR [place]   HB 9.4 FERRITIN 26  140 LBS; no serologies-pt can't afford test  . Helicobacter pylori gastritis 2009  . Hypertension   . Migraine headache    NONE SINCE AGE 81  . Sacroiliitis ([place]) 2009  . Spondyloarthropathy ([place]) 2009 EROSIVE   Past Surgical History:  Procedure Laterality Date  . BACK SURGERY     2018  outpatient  . COLONOSCOPY  03/2015   [facility]: Crohn's like stricture at ICV. Stricture was not traversable. abnormal mucosa of ICV. Bx, c/w Crohn's.   . COLONOSCOPY N/A 07/30/2013   stenotic IC VALVE. UNABLE TO INTUBATE ILEUM.  Marland Kitchen Crohn's     Stomach Surgery  for Crohn's  done at [place] MICRODISCECTOMY Right 02/04/2017   Procedure: Microdiscectomy - Lumbar four-five - right redo;  Surgeon: Kary Kos, MD;  Location: [facility];  Service: Neurosurgery;  Laterality: Right;   Allergies  Allergen Reactions  . Penicillins Swelling and Rash      .  Azathioprine Nausea And Vomiting  . Iodinated Diagnostic Agents Nausea And Vomiting  . Pentasa [Mesalamine Er] Rash   Current Outpatient Prescriptions  Medication Sig Dispense Refill  . cholecalciferol (VITAMIN D) 1000 units tablet Take 1 tablet (1,000 Units total) by mouth daily. BEGIN AFTER WEEKLY VIT D DOSING IS COMPLETE    . cloNIDine (CATAPRES) 0.1 MG tablet Take 1 tablet (0.1 mg total) by mouth 2 (two) times daily.    . cyclobenzaprine (FLEXERIL) 10 MG tablet Take 10 mg by mouth as needed for muscle spasms.    . Multiple Vitamin (MULTIVITAMIN WITH MINERALS) TABS tablet Take 1 tablet by mouth daily.    . ondansetron (ZOFRAN ODT) 4 MG disintegrating tablet Take 1 tablet (4 mg total) by mouth every 8 (eight) hours as needed for nausea.    .      .      .      .      .      . traMADol (ULTRAM) 50 MG tablet Take 50 mg by mouth 4 (four) times daily as needed for moderate pain.     Review of Systems PER HPI OTHERWISE ALL SYSTEMS ARE NEGATIVE.    Objective:   Physical Exam  Constitutional: He is oriented to person, place, and time. He appears well-developed and well-nourished. No distress.  HENT:  Head: Normocephalic and atraumatic.  Mouth/Throat: Oropharynx is clear and moist. No oropharyngeal exudate.  Eyes: Pupils are equal, round, and  reactive to light. No scleral icterus.  Neck: Normal range of motion. Neck supple.  Cardiovascular: Normal rate, regular rhythm and normal heart sounds.   Pulmonary/Chest: Effort normal and breath sounds normal. No respiratory distress.  Abdominal: Soft. Bowel sounds are normal. He exhibits no distension. There is tenderness. There is no rebound and no guarding.  MILD PERI-UMBILICAL TTP  Musculoskeletal: He exhibits no edema.  Lymphadenopathy:    He has no cervical adenopathy.  Neurological: He is alert and oriented to person, place, and time.  NO FOCAL DEFICITS  Psychiatric: He has a normal mood and affect.  Vitals reviewed.     Assessment &  Plan:

## 2017-02-24 NOTE — Progress Notes (Signed)
CC'ED TO PCP 

## 2017-02-24 NOTE — Assessment & Plan Note (Signed)
NO BRBPR OR MELENA. Hb NEAR NORMAL SEP 2018(Hb 11.9).  CONTINUE TO MONITOR SYMPTOMS.

## 2017-02-24 NOTE — Assessment & Plan Note (Addendum)
DISEASE CONTROLLED OFF MEDS.ACUTEFLARE LIKELY DUE TO BILE SALT INDUCED DIARRHEA AND LESS LIKELY VIRAL ILLNESS.   COMPLETE vaccine for MEASLES/MUMPS/RUBELLA. COMPLETE THE PNEUMONIA VACCINE. GET THE TETANUS SHOT. ORDER [facility]. CALL IN 4 WEEKS TO RE-START BIOLOGIC AGENT: TYSABRI OR STELARA. COMPLETE BLOOD DRAW FOR: VIT D 25-OH in OCT 2018.  FOLLOW UP IN 4 MOS.   GREATER THAN 50% WAS SPENT IN COUNSELING & COORDINATION OF CARE WITH THE PATIENT: DISCUSSED BENEFITS, RISKS, AND MANAGEMENT OF CROHN'S DISEASE. TOTAL ENCOUNTER TIME: 25 MINS.

## 2017-02-24 NOTE — Progress Notes (Signed)
ON RECALL  °

## 2017-02-28 NOTE — Telephone Encounter (Signed)
REVIEWED. Tdap UTD. No need to repeat. OK TO START MMR SERIES. 

## 2017-02-28 NOTE — Telephone Encounter (Signed)
T/C call from Karen, [facility] Phone number: [phone]     Fax number: [phone]  She had questions about the order for the immunizations Dr. Fields faxed in.  1. She said Dr. Fields must have meant to order Tdap NOT DTP, since DTP is not given to anyone older than 41 years of age.   They have on record that pt had Tdap in 2016.  2. The order said complete the MMR. So she needs to know if pt has had first vaccine ( it is series of 2 shots a month apart). If he needs both shots, there is no way it can be completed in 2 weeks. Please advise!  3. She also needs medication list to make sure there are no interactions between the drugs and the vaccines.  

## 2017-02-28 NOTE — Telephone Encounter (Signed)
F[facility]ed [facility] to Santiago [facility] and also the last OV notes of 02/24/2017 with the list of allergies and current medication list.

## 2017-03-17 DIAGNOSIS — Z23 Encounter for immunization: Secondary | ICD-10-CM | POA: Diagnosis not present

## 2017-04-08 LAB — VITAMIN D 25 HYDROXY (VIT D DEFICIENCY, FRACTURES): VIT D 25 HYDROXY: 35 ng/mL (ref 30–100)

## 2017-05-16 ENCOUNTER — Telehealth: Payer: Self-pay | Admitting: Gastroenterology

## 2017-05-16 DIAGNOSIS — I10 Essential (primary) hypertension: Secondary | ICD-10-CM | POA: Diagnosis not present

## 2017-05-16 DIAGNOSIS — G44209 Tension-type headache, unspecified, not intractable: Secondary | ICD-10-CM | POA: Diagnosis not present

## 2017-05-16 NOTE — Telephone Encounter (Signed)
[phone] please call patient, has questions about getting back on a crohns medication schedule

## 2017-05-17 NOTE — Telephone Encounter (Signed)
LMOM to call.

## 2017-05-17 NOTE — Telephone Encounter (Signed)
PT said he discussed this with Dr. Oneida Alar in September. He completed the immunizations, etc as requested. Dr.Fields, please advise!

## 2017-05-18 ENCOUNTER — Encounter: Payer: Self-pay | Admitting: Gastroenterology

## 2017-05-18 NOTE — Telephone Encounter (Signed)
Called patient TO DISCUSS CONCERNS. LVM-CALL [phone] TO Discuss. Will discuss management options: PT AT LOW risk for postoperative CD recurrence are nonsmokers, do not have penetrating disease, and have never had a prior surgical resection. RECOMMEND no treatment after surgery in this population with subsequently performing a 42-monthpostoperative colonoscopy to assess for the presence of CD recurrence. If re-occurrence, WILL DISCUSS WITH DR. BRenee HarderAND CONSIDER MTX + CIMZIA.

## 2017-05-25 ENCOUNTER — Telehealth: Payer: Self-pay | Admitting: Gastroenterology

## 2017-05-25 NOTE — Telephone Encounter (Signed)
[phone]  PATIENT RETURNED YOUR CALL, PLEASE CALL HIM BACK WHEN YOU CAN.

## 2017-05-31 NOTE — Telephone Encounter (Signed)
SEE TC DEC 3.

## 2017-06-02 NOTE — Telephone Encounter (Signed)
Called patient TO DISCUSS CONCERNS. APPETITE NOT FULL. NIGHT SWEATS. NOT A WHOLE LOT OF PAIN. BMs CAN BE ALL OVER THE PLACE. HAVING 2-3 BMs A DAY. DISCUSSED BENEFITS, RISKS, AND MANAGEMENT OF CROHN'S AFTER SURGERY.

## 2017-07-22 DIAGNOSIS — I10 Essential (primary) hypertension: Secondary | ICD-10-CM | POA: Diagnosis not present

## 2017-07-22 DIAGNOSIS — F17210 Nicotine dependence, cigarettes, uncomplicated: Secondary | ICD-10-CM | POA: Diagnosis not present

## 2017-07-22 DIAGNOSIS — K5000 Crohn's disease of small intestine without complications: Secondary | ICD-10-CM | POA: Diagnosis not present

## 2017-07-27 ENCOUNTER — Encounter: Payer: Self-pay | Admitting: *Deleted

## 2017-07-27 ENCOUNTER — Ambulatory Visit (INDEPENDENT_AMBULATORY_CARE_PROVIDER_SITE_OTHER): Payer: Medicare Other | Admitting: Gastroenterology

## 2017-07-27 ENCOUNTER — Other Ambulatory Visit: Payer: Self-pay | Admitting: *Deleted

## 2017-07-27 ENCOUNTER — Encounter: Payer: Self-pay | Admitting: Gastroenterology

## 2017-07-27 ENCOUNTER — Telehealth: Payer: Self-pay | Admitting: *Deleted

## 2017-07-27 DIAGNOSIS — M25539 Pain in unspecified wrist: Secondary | ICD-10-CM | POA: Diagnosis not present

## 2017-07-27 DIAGNOSIS — K50812 Crohn's disease of both small and large intestine with intestinal obstruction: Secondary | ICD-10-CM

## 2017-07-27 DIAGNOSIS — R197 Diarrhea, unspecified: Secondary | ICD-10-CM

## 2017-07-27 DIAGNOSIS — M2550 Pain in unspecified joint: Secondary | ICD-10-CM | POA: Insufficient documentation

## 2017-07-27 DIAGNOSIS — K645 Perianal venous thrombosis: Secondary | ICD-10-CM | POA: Insufficient documentation

## 2017-07-27 DIAGNOSIS — R21 Rash and other nonspecific skin eruption: Secondary | ICD-10-CM

## 2017-07-27 DIAGNOSIS — B354 Tinea corporis: Secondary | ICD-10-CM

## 2017-07-27 MED ORDER — CLOTRIMAZOLE 1 % EX CREA: TOPICAL_CREAM | Freq: Three times a day (TID) | CUTANEOUS | 0 refills | Status: DC

## 2017-07-27 MED ORDER — NA SULFATE-K SULFATE-MG SULF 17.5-3.13-1.6 GM/177ML PO SOLN: 1.0000 | ORAL | 0 refills | Status: DC

## 2017-07-27 MED ORDER — ONDANSETRON 4 MG PO TBDP: 4.0000 mg | ORAL_TABLET | Freq: Three times a day (TID) | ORAL | 1 refills | Status: DC | PRN

## 2017-07-27 NOTE — Progress Notes (Signed)
cc'ed to pcp °

## 2017-07-27 NOTE — Telephone Encounter (Signed)
Left detailed message on named VM advising dermatology appt scheduled with Dr. Nevada Crane on 08/03/17 at 3:00pm. Letter also mailed with appt information. I provided pt with there phone # in case he is unable to make this appt.

## 2017-07-27 NOTE — Patient Instructions (Addendum)
SEE SURGERY TO FIX YOUR HEMORRHOIDS WITHIN 7 DAYS.Marland Kitchen  COMPLETE COLONOSCOPY WITHIN 2 WEEKS.  See Dermatology for foot rash WITHIN ONE MONTH. TRY LOTRIMIN THREE TIMES A DAY TO FEET.  SEE RHEUMATOLOGY FOR JOINT PAIN WITHIN ONE MONTH.  FOLLOW UP IN 4 MOS.

## 2017-07-27 NOTE — Assessment & Plan Note (Signed)
IN PAIN FOR 2 WEEKS.  USE PREPARATION H UP TO 4 TIMES A DAY FOR RECTAL BLEEDING, ITCHING, PRESSURE, BURNING, OR PAIN. SEE SURGERY TO LANCE YOUR HEMORRHOIDS. FOLLOW UP IN 4 MOS.

## 2017-07-27 NOTE — Assessment & Plan Note (Signed)
LIKELY CAUSE FOR FOOT RASH.  SEE DERMAtology for foot rash WITHIN ONE MONTH.  FOLLOW UP IN 4 MOS.

## 2017-07-27 NOTE — Assessment & Plan Note (Signed)
EVIDENCE FOR MILDLY ACTIVE DISEASE.  COMPLETE COLONOSCOPY WITHIN 2 WEEKS. IF ACTIVE DISEASE CONSIDER STELARA. FOLLOW UP IN 4 MOS.

## 2017-07-27 NOTE — Addendum Note (Signed)
Addended by: [name] on: 07/27/2017 11:40 AM   Modules accepted: Orders

## 2017-07-27 NOTE — Telephone Encounter (Signed)
Left detailed message on VM advising pre-op scheduled for 07/29/17 at 2:15pm.

## 2017-07-27 NOTE — Progress Notes (Signed)
Subjective:    Patient ID: Joshua Robertson, male    DOB: [birth year], 42 y.o.   MRN: [id number]  Caryl Bis, MD  HPI A little nauseated. THROWN UP TWICE(LAST WEEKEND, NO BLOOD). FEELING BAD FOR ABOUT ONE MO. JOINTS ACHE(ELBOWS, WRIST, ANKLE, FEET). HEMORRHOID FOR PAST 3 WEEK. CONFIRMED BY GIRLFRIEND. PROBLEMS WITH CONSTIPATED BEFORE HEMORRHOID. AFTER TWO BIG BOWEL MOVEMENTS. PREP H HELPS. Joshua HAVE HAD SOME SWELLING IN FEET AND KNUCKLES. BUT THAT'S GONE. WEED: 2-3 TIMES A DAY. NOW SWITCHING. BEEN FROM #7--3-->#1), USU> #5-7. ONLY MILD ABDOMINAL PAIN. SINCE SURGERY FOR THE MOST PART BETTER. R HEMICOLECTOMY: SURGERY APR 2018. RASH ON FEET IN PAST MONTH(ITCHES, RED/SCALY, BROWN PATCHES). STIFFNESS IN BACK AND NO BUTTOCK. NO SORES IN MOUTH, RASH ON LEGS, OR POSTERIOR PELVIC PAIN.    PT DENIES FEVER, CHILLS, HEMATOCHEZIA, HEMATEMESIS, melena,  CHEST PAIN, SHORTNESS OF BREATH, CHANGE IN BOWEL IN HABITS, problems swallowing, ODYNOPHAGIA, OR heartburn or indigestion.  Past Medical History:  Diagnosis Date  . BMI between 19-24,adult JAN 2010 149 LBS  . Crohn's disease ([place]) AUG 2009 NUR [place]   HB 9.4 FERRITIN 26  140 LBS; no serologies-pt can't afford test  . Helicobacter pylori gastritis 2009  . Hypertension   . Migraine headache    NONE SINCE AGE 67  . Sacroiliitis ([place]) 2009  . Spondyloarthropathy ([place]) 2009 EROSIVE    Past Surgical History:  Procedure Laterality Date  . BACK SURGERY     2018  outpatient  . COLONOSCOPY  03/2015   [facility]: Crohn's like stricture at ICV. Stricture was not traversable. abnormal mucosa of ICV. Bx, c/w Crohn's.   . COLONOSCOPY N/A 07/30/2013   stenotic IC VALVE. UNABLE TO INTUBATE ILEUM.  Marland Kitchen Crohn's     Stomach Surgery  for Crohn's  done at [place] MICRODISCECTOMY Right 02/04/2017   Procedure: Microdiscectomy - Lumbar four-five - right redo;  Surgeon: Kary Kos, MD;  Location: [place];  Service: Neurosurgery;   Laterality: Right;    Allergies  Allergen Reactions  . Penicillins Swelling and Rash       . Azathioprine Nausea And Vomiting  . Iodinated Diagnostic Agents Nausea And Vomiting  . Pentasa [Mesalamine Er] Rash   Current Outpatient Medications  Medication Sig Dispense Refill  . cholecalciferol (VITAMIN D) 1000 units tablet Take 1 tablet (1,000 Units total) by mouth daily. BEGIN AFTER WEEKLY VIT D DOSING IS COMPLETE    . cloNIDine (CATAPRES) 0.1 MG tablet Take 1 tablet (0.1 mg total) by mouth 2 (two) times daily.    . Multiple Vitamin (MULTIVITAMIN WITH MINERALS) TABS tablet Take 1 tablet by mouth daily. Doesn't take everyday    . ondansetron (ZOFRAN ODT) 4 MG disintegrating tablet Take 1 tablet (4 mg total) by mouth every 8 (eight) hours as needed for nausea.    . cyclobenzaprine (FLEXERIL) 10 MG tablet Take 10 mg by mouth as needed for muscle spasms.    .      .      .      .      .      . traMADol (ULTRAM) 50 MG tablet Take 50 mg by mouth 4 (four) times daily as needed for moderate pain.     Review of Systems PER HPI OTHERWISE ALL SYSTEMS ARE NEGATIVE.    Objective:   Physical Exam  Constitutional: He is oriented to person, place, and time. He appears well-developed and well-nourished. No distress.  HENT:  Head: Normocephalic and atraumatic.  Mouth/Throat: Oropharynx is clear and moist. No oropharyngeal exudate.  Eyes: Pupils are equal, round, and reactive to light. No scleral icterus.  Neck: Normal range of motion. Neck supple.  Cardiovascular: Normal rate, regular rhythm and normal heart sounds.  Pulmonary/Chest: Effort normal and breath sounds normal. No respiratory distress.  Abdominal: Soft. Bowel sounds are normal. He exhibits no distension. There is no tenderness.  Genitourinary: Rectal exam shows tenderness. Internal hemorrhoid: THROMBOSED.     Genitourinary Comments: NO TENDER AND NO MASSES/FLUCTUANCE FELT IN IN THE PERIANAL REGION  Musculoskeletal: He exhibits  no edema.  Lymphadenopathy:    He has no cervical adenopathy.  Neurological: He is alert and oriented to person, place, and time.  NO FOCAL DEFICITS  Skin: Rash (MACULAR HYPERPIGMENTED LESIONS ON FEET(DORSAL PEDAL SURFACE)) noted.  Psychiatric: He has a normal mood and affect.  Vitals reviewed.     Assessment & Plan:

## 2017-07-28 ENCOUNTER — Encounter (HOSPITAL_COMMUNITY): Payer: Self-pay

## 2017-07-28 ENCOUNTER — Encounter: Payer: Self-pay | Admitting: General Surgery

## 2017-07-28 ENCOUNTER — Telehealth: Payer: Self-pay | Admitting: *Deleted

## 2017-07-28 ENCOUNTER — Ambulatory Visit (INDEPENDENT_AMBULATORY_CARE_PROVIDER_SITE_OTHER): Payer: BLUE CROSS/BLUE SHIELD | Admitting: General Surgery

## 2017-07-28 ENCOUNTER — Encounter (HOSPITAL_COMMUNITY): Admission: RE | Admit: 2017-07-28 | Payer: BLUE CROSS/BLUE SHIELD | Source: Ambulatory Visit

## 2017-07-28 VITALS — BP 139/91 | HR 69 | Temp 99.3°F | Ht 70.0 in | Wt 136.0 lb

## 2017-07-28 DIAGNOSIS — K645 Perianal venous thrombosis: Secondary | ICD-10-CM

## 2017-07-28 NOTE — Progress Notes (Signed)
ON RECALL  °

## 2017-07-28 NOTE — H&P (Signed)
Joshua Robertson; [id number]; [birth year]   HPI Patient is a 42 year old black male who was referred to my care by Dr. Oneida Alar for evaluation and treatment of a thrombosed hemorrhoid.  Patient does have a long-standing history of Crohn's disease.  He states that he started having rectal pain and swelling 2 weeks ago.  He was seen by Dr. Oneida Alar and noted to have a thrombosed hemorrhoid.  He states the swelling has decreased slightly, but is still present.  No active bleeding is noted.  He currently has no pain except when pressure is applied.  He denies a history of hemorrhoidal issues in the past.  Patient states he has been constipated recently.     Past Medical History:  Diagnosis Date  . BMI between 19-24,adult JAN 2010 149 LBS  . Crohn's disease ([place]) AUG 2009 NUR [facility]   HB 9.4 FERRITIN 26  140 LBS; no serologies-pt can't afford test  . Helicobacter pylori gastritis 2009  . Hypertension   . Migraine headache    NONE SINCE AGE 83  . Sacroiliitis ([place]) 2009  . Spondyloarthropathy ([place]) 2009 EROSIVE         Past Surgical History:  Procedure Laterality Date  . BACK SURGERY     2018  outpatient  . COLONOSCOPY  03/2015   [facility]: Crohn's like stricture at ICV. Stricture was not traversable. abnormal mucosa of ICV. Bx, c/w Crohn's.   . COLONOSCOPY N/A 07/30/2013   stenotic IC VALVE. UNABLE TO INTUBATE ILEUM.  Marland Kitchen Crohn's     Stomach Surgery  for Crohn's  done at [facility] MICRODISCECTOMY Right 02/04/2017   Procedure: Microdiscectomy - Lumbar four-five - right redo;  Surgeon: Kary Kos, MD;  Location: [place];  Service: Neurosurgery;  Laterality: Right;         Family History  Problem Relation Age of Onset  . Hypertension Mother   . Hypertension Father   . Colon cancer Neg Hx   . Colon polyps Neg Hx   . Inflammatory bowel disease Neg Hx           Current Outpatient Medications on File Prior to Visit  Medication Sig  Dispense Refill  . cholecalciferol (VITAMIN D) 1000 units tablet Take 1 tablet (1,000 Units total) by mouth daily. BEGIN AFTER WEEKLY VIT D DOSING IS COMPLETE 30 tablet 11  . cloNIDine (CATAPRES) 0.1 MG tablet Take 1 tablet (0.1 mg total) by mouth 2 (two) times daily. 60 tablet 11  . clotrimazole (LOTRIMIN) 1 % cream Apply topically 3 (three) times daily. FOR 10 DAYS 30 g 0  . cyclobenzaprine (FLEXERIL) 10 MG tablet Take 10 mg by mouth as needed for muscle spasms.    Marland Kitchen HYDROcodone-acetaminophen (NORCO/VICODIN) 5-325 MG tablet Take 1 tablet by mouth every 4 (four) hours as needed for moderate pain. (Patient not taking: Reported on 11/11/2016) 8 tablet 0  . HYDROcodone-acetaminophen (NORCO/VICODIN) 5-325 MG tablet Take 1-2 tablets by mouth every 6 (six) hours as needed. (Patient not taking: Reported on 01/31/2017) 10 tablet 0  . methocarbamol (ROBAXIN) 500 MG tablet Take 1 tablet (500 mg total) by mouth 2 (two) times daily. (Patient not taking: Reported on 11/11/2016) 20 tablet 0  . Multiple Vitamin (MULTIVITAMIN WITH MINERALS) TABS tablet Take 1 tablet by mouth daily. Doesn't take everyday    . Na Sulfate-K Sulfate-Mg Sulf 17.5-3.13-1.6 GM/177ML SOLN Take 1 kit by mouth as directed. 1 Bottle 0  . ondansetron (ZOFRAN ODT) 4 MG disintegrating tablet Take 1 tablet (  4 mg total) by mouth every 8 (eight) hours as needed for nausea. (Patient not taking: Reported on 07/27/2017) 30 tablet 1  . oxyCODONE (OXY IR/ROXICODONE) 5 MG immediate release tablet Take 1-2 tablets (5-10 mg total) by mouth every 3 (three) hours as needed for breakthrough pain. (Patient not taking: Reported on 02/24/2017) 60 tablet 0  . predniSONE (DELTASONE) 10 MG tablet Take 4 tablets (40 mg total) by mouth daily with breakfast. 64m daily for 5 days 220mdaily for 5 days 1067maily for 5 days 5mg53mily for 5 days. (Patient not taking: Reported on 11/11/2016) 38 tablet 0   No current facility-administered medications on file prior to  visit.          Allergies  Allergen Reactions  . Penicillins Swelling and Rash    PATIENT HAS HAD A PCN REACTION WITH IMMEDIATE RASH, FACIAL/TONGUE/THROAT SWELLING, SOB, OR LIGHTHEADEDNESS WITH HYPOTENSION:  #  #  #  YES  #  #  #   HAS PT DEVELOPED SEVERE RASH INVOLVING MUCUS MEMBRANES or SKIN NECROSIS: #  #  #  YES  #  #  #  Has patient had a PCN reaction that required hospitalization No Has patient had a PCN reaction occurring within the last 10 years: No If all of the above answers are "NO", then may proceed with Cephalosporin use.   . Azathioprine Nausea And Vomiting  . Iodinated Diagnostic Agents Nausea And Vomiting  . Metrizamide Nausea And Vomiting  . Pentasa [Mesalamine Er] Rash    Social History       Substance and Sexual Activity  Alcohol Use No  . Alcohol/week: 0.0 oz  . Frequency: Never   Comment: very rare    Social History       Tobacco Use  Smoking Status Current Every Day Smoker  . Packs/day: 0.50  . Types: Cigarettes  . Last attempt to quit: 03/02/2016  . Years since quitting: 1.4  Smokeless Tobacco Never Used    Review of Systems  Constitutional: Negative.   HENT: Negative.   Eyes: Negative.   Respiratory: Negative.   Cardiovascular: Negative.   Gastrointestinal: Positive for nausea.  Genitourinary: Negative.   Musculoskeletal: Positive for joint pain.  Skin: Negative.   Neurological: Negative.   Endo/Heme/Allergies: Negative.   Psychiatric/Behavioral: Negative.     Objective      Vitals:   07/28/17 1058  BP: (!) 139/91  Pulse: 69  Temp: 99.3 F (37.4 C)    Physical Exam  Constitutional: He is well-developed, well-nourished, and in no distress.  HENT:  Head: Normocephalic and atraumatic.  Cardiovascular: Normal rate, regular rhythm and normal heart sounds. Exam reveals no gallop and no friction rub.  No murmur heard. Pulmonary/Chest: Effort normal and breath sounds normal. No respiratory distress. He has no  wheezes. He has no rales.  Abdominal: Soft. Bowel sounds are normal. He exhibits no distension. There is no tenderness.  Genitourinary:  Genitourinary Comments: Thrombosed external hemorrhoid at the 7 o'clock position.  No fistulas noted.  Vitals reviewed.   Assessment  Thrombosed external hemorrhoid, Crohn's disease Plan   Patient is scheduled for simple hemorrhoidectomy on 08/01/2017.  The risks and benefits of the procedure including bleeding, infection, and recurrence of the hemorrhoidal disease were fully explained to the patient, who gave informed consent.

## 2017-07-28 NOTE — Patient Instructions (Signed)
Joshua Robertson  07/28/2017     @PREFPERIOPPHARMACY @   Your procedure is scheduled on 08/01/2017.  Report to [facility] at 8:00 A.M.  Call this number if you have problems the morning of surgery:  [phone]   Remember:  Do not eat food or drink liquids after midnight.  Take these medicines the morning of surgery with A SIP OF WATER Catapress, Flexeril, Hydrocodone OR Oxy IR if needed, Robaxin, Zofran if needed   Do not wear jewelry, make-up or nail polish.  Do not wear lotions, powders, or perfumes, or deodorant.  Do not shave 48 hours prior to surgery.  Men may shave face and neck.  Do not bring valuables to the hospital.  [facility] is not responsible for any belongings or valuables.  Contacts, dentures or bridgework may not be worn into surgery.  Leave your suitcase in the car.  After surgery it may be brought to your room.  For patients admitted to the hospital, discharge time will be determined by your treatment team.  Patients discharged the day of surgery will not be allowed to drive home.   Please read over the following fact sheets that you were given. Surgical Site Infection Prevention and Anesthesia Post-op Instructions         PATIENT INSTRUCTIONS POST-ANESTHESIA  IMMEDIATELY FOLLOWING SURGERY:  Do not drive or operate machinery for the first twenty four hours after surgery.  Do not make any important decisions for twenty four hours after surgery or while taking narcotic pain medications or sedatives.  If you develop intractable nausea and vomiting or a severe headache please notify your doctor immediately.  FOLLOW-UP:  Please make an appointment with your surgeon as instructed. You do not need to follow up with anesthesia unless specifically instructed to do so.  WOUND CARE INSTRUCTIONS (if applicable):  Keep a dry clean dressing on the anesthesia/puncture wound site if there is drainage.  Once the wound has quit draining you may leave it open to air.   Generally you should leave the bandage intact for twenty four hours unless there is drainage.  If the epidural site drains for more than 36-48 hours please call the anesthesia department.  QUESTIONS?:  Please feel free to call your physician or the hospital operator if you have any questions, and they will be happy to assist you.      Surgical Procedures for Hemorrhoids Surgical procedures can be used to treat hemorrhoids. Hemorrhoids are swollen veins that are inside the rectum (internal hemorrhoids) or around the anus (external hemorrhoids). They are caused by increased pressure in the anal area. This pressure may result from straining to have a bowel movement (constipation), diarrhea, pregnancy, obesity, anal sex, or sitting for long periods of time. Hemorrhoids can cause symptoms such as pain and bleeding. Surgery may be needed if diet changes, lifestyle changes, and other treatments do not help your symptoms. Various surgical methods may be used. Three common methods are:  Closed hemorrhoidectomy. The hemorrhoids are surgically removed, and the surgical cuts (incisions) are closed with stitches (sutures).  Open hemorrhoidectomy. The hemorrhoids are surgically removed, but the incisions are allowed to heal without sutures.  Stapled hemorrhoidopexy. The hemorrhoids are removed using a device that takes out a ring of excess tissue.  Tell a health care provider about:  Any allergies you have.  All medicines you are taking, including vitamins, herbs, eye drops, creams, and over-the-counter medicines.  Any problems you or family members have had with  anesthetic medicines.  Any blood disorders you have.  Any surgeries you have had.  Any medical conditions you have.  Whether you are pregnant or may be pregnant. What are the risks? Generally, this is a safe procedure. However, problems may occur, including:  Infection.  Bleeding.  Allergic reactions to medicines.  Damage to other  structures or organs.  Pain.  Constipation.  Difficulty passing urine.  Narrowing of the anal canal (stenosis).  Difficulty controlling bowel movements (incontinence).  What happens before the procedure?  Ask your health care provider about: ? Changing or stopping your regular medicines. This is especially important if you are taking diabetes medicines or blood thinners. ? Taking medicines such as aspirin and ibuprofen. These medicines can thin your blood. Do not take these medicines before your procedure if your health care provider instructs you not to.  You may need to have a procedure to examine the inside of your colon with a scope (colonoscopy). Your health care provider may do this to make sure that there are no other causes for your bleeding or pain.  Follow instructions from your health care provider about eating or drinking restrictions.  You may be instructed to take a laxative and an enema to clean out your colon before surgery (bowel prep). Carefully follow instructions from your health care provider about bowel prep.  Ask your health care provider how your surgical site will be marked or identified.  You may be given antibiotic medicine to help prevent infection.  Plan to have someone take you home after the procedure. What happens during the procedure?  To reduce your risk of infection: ? Your health care team will wash or sanitize their hands. ? Your skin will be washed with soap.  An IV tube will be inserted into one of your veins.  You will be given one or more of the following: ? A medicine to help you relax (sedative). ? A medicine to numb the area (local anesthetic). ? A medicine to make you fall asleep (general anesthetic). ? A medicine that is injected into an area of your body to numb everything below the injection site (regional anesthetic).  A lubricating jelly may be placed into your rectum.  Your surgeon will insert a short scope (anoscope)  into your rectum to examine the hemorrhoids.  One of the following hemorrhoid procedures will be performed. Closed Hemorrhoidectomy  Your surgeon will use surgical instruments to open the tissue around the hemorrhoids.  The veins that supply the hemorrhoids will be tied off with a suture.  The hemorrhoids will be removed.  The tissue that surrounds the hemorrhoids will be closed with sutures that your body can absorb (absorbable sutures). Open Hemorrhoidectomy  The hemorrhoids will be removed with surgical instruments.  The incisions will be left open to heal without sutures. Stapled Hemorrhoidopexy  Your surgeon will use a circular stapling device to remove the hemorrhoids.  The device will be inserted into your anus. It will remove a circular ring of tissue that includes hemorrhoid tissue and some tissue above the hemorrhoids.  The staples in the device will close the edges of removed tissue. This will cut off the blood supply to the hemorrhoids and will pull any remaining hemorrhoids back into place. Each of these procedures may vary among health care providers and hospitals. What happens after the procedure?  Your blood pressure, heart rate, breathing rate, and blood oxygen level will be monitored often until the medicines you were given have worn off.  You will be given pain medicine as needed. This information is not intended to replace advice given to you by your health care provider. Make sure you discuss any questions you have with your health care provider. Document Released: 03/28/2009 Document Revised: 11/06/2015 Document Reviewed: 08/26/2014 Elsevier Interactive Patient Education  [employer].

## 2017-07-28 NOTE — Progress Notes (Signed)
Joshua Robertson; [id number]; [birth year]   HPI Patient is a 42 year old black male who was referred to my care by Dr. Oneida Alar for evaluation and treatment of a thrombosed hemorrhoid.  Patient does have a long-standing history of Crohn's disease.  He states that he started having rectal pain and swelling 2 weeks ago.  He was seen by Dr. Oneida Alar and noted to have a thrombosed hemorrhoid.  He states the swelling has decreased slightly, but is still present.  No active bleeding is noted.  He currently has no pain except when pressure is applied.  He denies a history of hemorrhoidal issues in the past.  Patient states he has been constipated recently. Past Medical History:  Diagnosis Date  . BMI between 19-24,adult JAN 2010 149 LBS  . Crohn's disease ([place]) AUG 2009 NUR [place]   HB 9.4 FERRITIN 26  140 LBS; no serologies-pt can't afford test  . Helicobacter pylori gastritis 2009  . Hypertension   . Migraine headache    NONE SINCE AGE 20  . Sacroiliitis ([place]) 2009  . Spondyloarthropathy ([place]) 2009 EROSIVE    Past Surgical History:  Procedure Laterality Date  . BACK SURGERY     2018  outpatient  . COLONOSCOPY  03/2015   [facility] R[facility]: Crohn's like stricture at ICV. Stricture was not traversable. abnormal mucosa of ICV. Bx, c/w Crohn's.   . COLONOSCOPY N/A 07/30/2013   stenotic IC VALVE. UNABLE TO INTUBATE ILEUM.  Marland Kitchen Crohn's     Stomach Surgery  for Crohn's  done at [facility] MICRODISCECTOMY Right 02/04/2017   Procedure: Microdiscectomy - Lumbar four-five - right redo;  Surgeon: Kary Kos, MD;  Location: [place];  Service: Neurosurgery;  Laterality: Right;    Family History  Problem Relation Age of Onset  . Hypertension Mother   . Hypertension Father   . Colon cancer Neg Hx   . Colon polyps Neg Hx   . Inflammatory bowel disease Neg Hx     Current Outpatient Medications on File Prior to Visit  Medication Sig Dispense Refill  . cholecalciferol (VITAMIN D)  1000 units tablet Take 1 tablet (1,000 Units total) by mouth daily. BEGIN AFTER WEEKLY VIT D DOSING IS COMPLETE 30 tablet 11  . cloNIDine (CATAPRES) 0.1 MG tablet Take 1 tablet (0.1 mg total) by mouth 2 (two) times daily. 60 tablet 11  . clotrimazole (LOTRIMIN) 1 % cream Apply topically 3 (three) times daily. FOR 10 DAYS 30 g 0  . cyclobenzaprine (FLEXERIL) 10 MG tablet Take 10 mg by mouth as needed for muscle spasms.    Marland Kitchen HYDROcodone-acetaminophen (NORCO/VICODIN) 5-325 MG tablet Take 1 tablet by mouth every 4 (four) hours as needed for moderate pain. (Patient not taking: Reported on 11/11/2016) 8 tablet 0  . HYDROcodone-acetaminophen (NORCO/VICODIN) 5-325 MG tablet Take 1-2 tablets by mouth every 6 (six) hours as needed. (Patient not taking: Reported on 01/31/2017) 10 tablet 0  . methocarbamol (ROBAXIN) 500 MG tablet Take 1 tablet (500 mg total) by mouth 2 (two) times daily. (Patient not taking: Reported on 11/11/2016) 20 tablet 0  . Multiple Vitamin (MULTIVITAMIN WITH MINERALS) TABS tablet Take 1 tablet by mouth daily. Doesn't take everyday    . Na Sulfate-K Sulfate-Mg Sulf 17.5-3.13-1.6 GM/177ML SOLN Take 1 kit by mouth as directed. 1 Bottle 0  . ondansetron (ZOFRAN ODT) 4 MG disintegrating tablet Take 1 tablet (4 mg total) by mouth every 8 (eight) hours as needed for nausea. (Patient not taking: Reported on 07/27/2017) 30  tablet 1  . oxyCODONE (OXY IR/ROXICODONE) 5 MG immediate release tablet Take 1-2 tablets (5-10 mg total) by mouth every 3 (three) hours as needed for breakthrough pain. (Patient not taking: Reported on 02/24/2017) 60 tablet 0  . predniSONE (DELTASONE) 10 MG tablet Take 4 tablets (40 mg total) by mouth daily with breakfast. 80m daily for 5 days 261mdaily for 5 days 1083maily for 5 days 5mg9mily for 5 days. (Patient not taking: Reported on 11/11/2016) 38 tablet 0   No current facility-administered medications on file prior to visit.     Allergies  Allergen Reactions  .  Penicillins Swelling and Rash    PATIENT HAS HAD A PCN REACTION WITH IMMEDIATE RASH, FACIAL/TONGUE/THROAT SWELLING, SOB, OR LIGHTHEADEDNESS WITH HYPOTENSION:  #  #  #  YES  #  #  #   HAS PT DEVELOPED SEVERE RASH INVOLVING MUCUS MEMBRANES or SKIN NECROSIS: #  #  #  YES  #  #  #  Has patient had a PCN reaction that required hospitalization No Has patient had a PCN reaction occurring within the last 10 years: No If all of the above answers are "NO", then may proceed with Cephalosporin use.   . Azathioprine Nausea And Vomiting  . Iodinated Diagnostic Agents Nausea And Vomiting  . Metrizamide Nausea And Vomiting  . Pentasa [Mesalamine Er] Rash    Social History   Substance and Sexual Activity  Alcohol Use No  . Alcohol/week: 0.0 oz  . Frequency: Never   Comment: very rare    Social History   Tobacco Use  Smoking Status Current Every Day Smoker  . Packs/day: 0.50  . Types: Cigarettes  . Last attempt to quit: 03/02/2016  . Years since quitting: 1.4  Smokeless Tobacco Never Used    Review of Systems  Constitutional: Negative.   HENT: Negative.   Eyes: Negative.   Respiratory: Negative.   Cardiovascular: Negative.   Gastrointestinal: Positive for nausea.  Genitourinary: Negative.   Musculoskeletal: Positive for joint pain.  Skin: Negative.   Neurological: Negative.   Endo/Heme/Allergies: Negative.   Psychiatric/Behavioral: Negative.     Objective   Vitals:   07/28/17 1058  BP: (!) 139/91  Pulse: 69  Temp: 99.3 F (37.4 C)    Physical Exam  Constitutional: He is well-developed, well-nourished, and in no distress.  HENT:  Head: Normocephalic and atraumatic.  Cardiovascular: Normal rate, regular rhythm and normal heart sounds. Exam reveals no gallop and no friction rub.  No murmur heard. Pulmonary/Chest: Effort normal and breath sounds normal. No respiratory distress. He has no wheezes. He has no rales.  Abdominal: Soft. Bowel sounds are normal. He exhibits no  distension. There is no tenderness.  Genitourinary:  Genitourinary Comments: Thrombosed external hemorrhoid at the 7 o'clock position.  No fistulas noted.  Vitals reviewed.   Assessment  Thrombosed external hemorrhoid, Crohn's disease Plan   Patient is scheduled for simple hemorrhoidectomy on 08/01/2017.  The risks and benefits of the procedure including bleeding, infection, and recurrence of the hemorrhoidal disease were fully explained to the patient, who gave informed consent.

## 2017-07-28 NOTE — Telephone Encounter (Signed)
Received a call from [place] in Endo. She reports per [facility] cancel pt TCS on Tuesday 08/02/17 and reschedule for 1 month out. They have already made pt aware.

## 2017-07-28 NOTE — Patient Instructions (Signed)
Hand wrote on pt's TCS instructions that he will be told at pre-op phone call 08/31/17 the time to register day of his colonoscopy.

## 2017-07-28 NOTE — Patient Instructions (Signed)
Surgical Procedures for Hemorrhoids Surgical procedures can be used to treat hemorrhoids. Hemorrhoids are swollen veins that are inside the rectum (internal hemorrhoids) or around the anus (external hemorrhoids). They are caused by increased pressure in the anal area. This pressure may result from straining to have a bowel movement (constipation), diarrhea, pregnancy, obesity, anal sex, or sitting for long periods of time. Hemorrhoids can cause symptoms such as pain and bleeding. Surgery may be needed if diet changes, lifestyle changes, and other treatments do not help your symptoms. Various surgical methods may be used. Three common methods are:  Closed hemorrhoidectomy. The hemorrhoids are surgically removed, and the surgical cuts (incisions) are closed with stitches (sutures).  Open hemorrhoidectomy. The hemorrhoids are surgically removed, but the incisions are allowed to heal without sutures.  Stapled hemorrhoidopexy. The hemorrhoids are removed using a device that takes out a ring of excess tissue.  Tell a health care provider about:  Any allergies you have.  All medicines you are taking, including vitamins, herbs, eye drops, creams, and over-the-counter medicines.  Any problems you or family members have had with anesthetic medicines.  Any blood disorders you have.  Any surgeries you have had.  Any medical conditions you have.  Whether you are pregnant or may be pregnant. What are the risks? Generally, this is a safe procedure. However, problems may occur, including:  Infection.  Bleeding.  Allergic reactions to medicines.  Damage to other structures or organs.  Pain.  Constipation.  Difficulty passing urine.  Narrowing of the anal canal (stenosis).  Difficulty controlling bowel movements (incontinence).  What happens before the procedure?  Ask your health care provider about: ? Changing or stopping your regular medicines. This is especially important if you are  taking diabetes medicines or blood thinners. ? Taking medicines such as aspirin and ibuprofen. These medicines can thin your blood. Do not take these medicines before your procedure if your health care provider instructs you not to.  You may need to have a procedure to examine the inside of your colon with a scope (colonoscopy). Your health care provider may do this to make sure that there are no other causes for your bleeding or pain.  Follow instructions from your health care provider about eating or drinking restrictions.  You may be instructed to take a laxative and an enema to clean out your colon before surgery (bowel prep). Carefully follow instructions from your health care provider about bowel prep.  Ask your health care provider how your surgical site will be marked or identified.  You may be given antibiotic medicine to help prevent infection.  Plan to have someone take you home after the procedure. What happens during the procedure?  To reduce your risk of infection: ? Your health care team will wash or sanitize their hands. ? Your skin will be washed with soap.  An IV tube will be inserted into one of your veins.  You will be given one or more of the following: ? A medicine to help you relax (sedative). ? A medicine to numb the area (local anesthetic). ? A medicine to make you fall asleep (general anesthetic). ? A medicine that is injected into an area of your body to numb everything below the injection site (regional anesthetic).  A lubricating jelly may be placed into your rectum.  Your surgeon will insert a short scope (anoscope) into your rectum to examine the hemorrhoids.  One of the following hemorrhoid procedures will be performed. Closed Hemorrhoidectomy  Your surgeon  will use surgical instruments to open the tissue around the hemorrhoids.  The veins that supply the hemorrhoids will be tied off with a suture.  The hemorrhoids will be removed.  The tissue  that surrounds the hemorrhoids will be closed with sutures that your body can absorb (absorbable sutures). Open Hemorrhoidectomy  The hemorrhoids will be removed with surgical instruments.  The incisions will be left open to heal without sutures. Stapled Hemorrhoidopexy  Your surgeon will use a circular stapling device to remove the hemorrhoids.  The device will be inserted into your anus. It will remove a circular ring of tissue that includes hemorrhoid tissue and some tissue above the hemorrhoids.  The staples in the device will close the edges of removed tissue. This will cut off the blood supply to the hemorrhoids and will pull any remaining hemorrhoids back into place. Each of these procedures may vary among health care providers and hospitals. What happens after the procedure?  Your blood pressure, heart rate, breathing rate, and blood oxygen level will be monitored often until the medicines you were given have worn off.  You will be given pain medicine as needed. This information is not intended to replace advice given to you by your health care provider. Make sure you discuss any questions you have with your health care provider. Document Released: 03/28/2009 Document Revised: 11/06/2015 Document Reviewed: 08/26/2014 Elsevier Interactive Patient Education  [employer].

## 2017-07-28 NOTE — Telephone Encounter (Signed)
Per carolyn will do pre-op phone call

## 2017-07-28 NOTE — Telephone Encounter (Signed)
Spoke with pt and he has been rescheduled to 09/06/17 at 9:00am. New instructions mailed to pt. Hoyle Sauer aware of new date. She is checking regarding his pre-op

## 2017-07-29 ENCOUNTER — Encounter (HOSPITAL_COMMUNITY)
Admission: RE | Admit: 2017-07-29 | Discharge: 2017-07-29 | Disposition: A | Discharge status: Home / Self Care | Payer: Medicare Other | Source: Ambulatory Visit | Attending: General Surgery | Admitting: General Surgery

## 2017-07-29 ENCOUNTER — Other Ambulatory Visit: Payer: Self-pay

## 2017-07-29 ENCOUNTER — Encounter (HOSPITAL_COMMUNITY): Payer: Self-pay

## 2017-07-29 ENCOUNTER — Encounter (HOSPITAL_COMMUNITY): Admission: RE | Admit: 2017-07-29 | Payer: BLUE CROSS/BLUE SHIELD | Source: Ambulatory Visit

## 2017-07-29 DIAGNOSIS — K645 Perianal venous thrombosis: Secondary | ICD-10-CM | POA: Diagnosis not present

## 2017-07-29 DIAGNOSIS — Z79899 Other long term (current) drug therapy: Secondary | ICD-10-CM | POA: Diagnosis not present

## 2017-07-29 DIAGNOSIS — Z87891 Personal history of nicotine dependence: Secondary | ICD-10-CM | POA: Diagnosis not present

## 2017-07-29 DIAGNOSIS — Z91041 Radiographic dye allergy status: Secondary | ICD-10-CM | POA: Diagnosis not present

## 2017-07-29 DIAGNOSIS — Z880 Allergy status to penicillin: Secondary | ICD-10-CM | POA: Diagnosis not present

## 2017-07-29 DIAGNOSIS — K648 Other hemorrhoids: Secondary | ICD-10-CM | POA: Diagnosis not present

## 2017-07-29 DIAGNOSIS — Z888 Allergy status to other drugs, medicaments and biological substances status: Secondary | ICD-10-CM | POA: Diagnosis not present

## 2017-07-29 DIAGNOSIS — K5090 Crohn's disease, unspecified, without complications: Secondary | ICD-10-CM | POA: Diagnosis not present

## 2017-07-29 DIAGNOSIS — I10 Essential (primary) hypertension: Secondary | ICD-10-CM | POA: Diagnosis not present

## 2017-07-29 HISTORY — DX: Sickle-cell trait: D57.3

## 2017-07-29 LAB — CBC WITH DIFFERENTIAL/PLATELET
Basophils Absolute: 0 10*3/uL (ref 0.0–0.1)
Basophils Relative: 0 %
EOS PCT: 2 %
Eosinophils Absolute: 0.2 10*3/uL (ref 0.0–0.7)
HEMATOCRIT: 27.9 % — AB (ref 39.0–52.0)
HEMOGLOBIN: 8.7 g/dL — AB (ref 13.0–17.0)
LYMPHS ABS: 2.7 10*3/uL (ref 0.7–4.0)
LYMPHS PCT: 24 %
MCH: 19.9 pg — ABNORMAL LOW (ref 26.0–34.0)
MCHC: 31.2 g/dL (ref 30.0–36.0)
MCV: 63.7 fL — AB (ref 78.0–100.0)
MONOS PCT: 4 %
Monocytes Absolute: 0.4 10*3/uL (ref 0.1–1.0)
Neutro Abs: 7.8 10*3/uL — ABNORMAL HIGH (ref 1.7–7.7)
Neutrophils Relative %: 70 %
Platelets: 510 10*3/uL — ABNORMAL HIGH (ref 150–400)
RBC: 4.38 MIL/uL (ref 4.22–5.81)
RDW: 19.5 % — ABNORMAL HIGH (ref 11.5–15.5)
WBC: 11.1 10*3/uL — AB (ref 4.0–10.5)

## 2017-07-29 LAB — BASIC METABOLIC PANEL
ANION GAP: 10 (ref 5–15)
BUN: 8 mg/dL (ref 6–20)
CHLORIDE: 104 mmol/L (ref 101–111)
CO2: 26 mmol/L (ref 22–32)
Calcium: 9.1 mg/dL (ref 8.9–10.3)
Creatinine, Ser: 0.78 mg/dL (ref 0.61–1.24)
GFR calc Af Amer: >60 mL/min (ref >60)
GLUCOSE: 99 mg/dL (ref 65–99)
POTASSIUM: 3.4 mmol/L — AB (ref 3.5–5.1)
Sodium: 140 mmol/L (ref 135–145)

## 2017-08-01 ENCOUNTER — Other Ambulatory Visit: Payer: Self-pay

## 2017-08-01 ENCOUNTER — Encounter (HOSPITAL_COMMUNITY)
Admission: RE | Disposition: A | Discharge status: Home / Self Care | Payer: Self-pay | Source: Ambulatory Visit | Attending: General Surgery

## 2017-08-01 ENCOUNTER — Encounter (HOSPITAL_COMMUNITY): Payer: Self-pay | Admitting: *Deleted

## 2017-08-01 ENCOUNTER — Ambulatory Visit (HOSPITAL_COMMUNITY): Payer: Medicare Other | Admitting: Anesthesiology

## 2017-08-01 ENCOUNTER — Ambulatory Visit (HOSPITAL_COMMUNITY)
Admission: RE | Admit: 2017-08-01 | Discharge: 2017-08-01 | Disposition: A | Discharge status: Home / Self Care | Payer: Medicare Other | Source: Ambulatory Visit | Attending: General Surgery | Admitting: General Surgery

## 2017-08-01 DIAGNOSIS — I10 Essential (primary) hypertension: Secondary | ICD-10-CM | POA: Diagnosis not present

## 2017-08-01 DIAGNOSIS — K5090 Crohn's disease, unspecified, without complications: Secondary | ICD-10-CM | POA: Insufficient documentation

## 2017-08-01 DIAGNOSIS — Z91041 Radiographic dye allergy status: Secondary | ICD-10-CM | POA: Insufficient documentation

## 2017-08-01 DIAGNOSIS — Z79899 Other long term (current) drug therapy: Secondary | ICD-10-CM | POA: Diagnosis not present

## 2017-08-01 DIAGNOSIS — Z87891 Personal history of nicotine dependence: Secondary | ICD-10-CM | POA: Insufficient documentation

## 2017-08-01 DIAGNOSIS — K645 Perianal venous thrombosis: Secondary | ICD-10-CM

## 2017-08-01 DIAGNOSIS — Z888 Allergy status to other drugs, medicaments and biological substances status: Secondary | ICD-10-CM | POA: Diagnosis not present

## 2017-08-01 DIAGNOSIS — K648 Other hemorrhoids: Secondary | ICD-10-CM | POA: Diagnosis not present

## 2017-08-01 DIAGNOSIS — Z880 Allergy status to penicillin: Secondary | ICD-10-CM | POA: Insufficient documentation

## 2017-08-01 HISTORY — PX: HEMORRHOID SURGERY: SHX153

## 2017-08-01 SURGERY — HEMORRHOIDECTOMY
Anesthesia: General

## 2017-08-01 MED ORDER — LIDOCAINE HCL (PF) 1 % IJ SOLN
INTRAMUSCULAR | Status: AC
  Filled 2017-08-01: qty 5

## 2017-08-01 MED ORDER — HYDROMORPHONE HCL 1 MG/ML IJ SOLN
0.5000 mg | Freq: Once | INTRAMUSCULAR | Status: AC
  Administered 2017-08-01: 0.5 mg via INTRAVENOUS

## 2017-08-01 MED ORDER — MIDAZOLAM HCL 2 MG/2ML IJ SOLN
1.0000 mg | INTRAMUSCULAR | Status: AC
  Administered 2017-08-01: 2 mg via INTRAVENOUS
  Filled 2017-08-01: qty 2

## 2017-08-01 MED ORDER — FENTANYL CITRATE (PF) 100 MCG/2ML IJ SOLN
INTRAMUSCULAR | Status: AC
  Filled 2017-08-01: qty 2

## 2017-08-01 MED ORDER — FENTANYL CITRATE (PF) 100 MCG/2ML IJ SOLN
25.0000 ug | Freq: Once | INTRAMUSCULAR | Status: AC
  Administered 2017-08-01: 25 ug via INTRAVENOUS
  Filled 2017-08-01: qty 2

## 2017-08-01 MED ORDER — CHLORHEXIDINE GLUCONATE CLOTH 2 % EX PADS: 6.0000 | MEDICATED_PAD | Freq: Once | CUTANEOUS | Status: DC

## 2017-08-01 MED ORDER — SODIUM CHLORIDE 0.9 % IR SOLN
Status: DC | PRN
  Administered 2017-08-01: 1000 mL

## 2017-08-01 MED ORDER — BUPIVACAINE LIPOSOME 1.3 % IJ SUSP
INTRAMUSCULAR | Status: AC
  Filled 2017-08-01: qty 20

## 2017-08-01 MED ORDER — METRONIDAZOLE IN NACL 5-0.79 MG/ML-% IV SOLN
INTRAVENOUS | Status: AC
  Filled 2017-08-01: qty 100

## 2017-08-01 MED ORDER — MIDAZOLAM HCL 2 MG/2ML IJ SOLN
INTRAMUSCULAR | Status: AC
  Filled 2017-08-01: qty 2

## 2017-08-01 MED ORDER — FENTANYL CITRATE (PF) 100 MCG/2ML IJ SOLN
25.0000 ug | INTRAMUSCULAR | Status: DC | PRN
  Administered 2017-08-01 (×4): 50 ug via INTRAVENOUS

## 2017-08-01 MED ORDER — FENTANYL CITRATE (PF) 250 MCG/5ML IJ SOLN
INTRAMUSCULAR | Status: AC
  Filled 2017-08-01: qty 5

## 2017-08-01 MED ORDER — PROPOFOL 10 MG/ML IV BOLUS
INTRAVENOUS | Status: AC
  Filled 2017-08-01: qty 20

## 2017-08-01 MED ORDER — HYDROMORPHONE HCL 1 MG/ML IJ SOLN
INTRAMUSCULAR | Status: AC
  Filled 2017-08-01: qty 0.5

## 2017-08-01 MED ORDER — BUPIVACAINE LIPOSOME 1.3 % IJ SUSP
INTRAMUSCULAR | Status: DC | PRN
  Administered 2017-08-01: 20 mL

## 2017-08-01 MED ORDER — HYDROCODONE-ACETAMINOPHEN 5-325 MG PO TABS: 1.0000 | ORAL_TABLET | ORAL | 0 refills | Status: DC | PRN

## 2017-08-01 MED ORDER — KETOROLAC TROMETHAMINE 30 MG/ML IJ SOLN
30.0000 mg | Freq: Once | INTRAMUSCULAR | Status: AC
  Administered 2017-08-01: 30 mg via INTRAVENOUS

## 2017-08-01 MED ORDER — ONDANSETRON HCL 4 MG/2ML IJ SOLN
4.0000 mg | Freq: Once | INTRAMUSCULAR | Status: AC
  Administered 2017-08-01: 4 mg via INTRAVENOUS
  Filled 2017-08-01: qty 2

## 2017-08-01 MED ORDER — LIDOCAINE VISCOUS 2 % MT SOLN
OROMUCOSAL | Status: AC
  Filled 2017-08-01: qty 15

## 2017-08-01 MED ORDER — METRONIDAZOLE IN NACL 5-0.79 MG/ML-% IV SOLN
500.0000 mg | INTRAVENOUS | Status: AC
  Administered 2017-08-01: 500 mg via INTRAVENOUS

## 2017-08-01 MED ORDER — PROPOFOL 10 MG/ML IV BOLUS
INTRAVENOUS | Status: DC | PRN
  Administered 2017-08-01: 200 mg via INTRAVENOUS

## 2017-08-01 MED ORDER — MIDAZOLAM HCL 5 MG/5ML IJ SOLN
INTRAMUSCULAR | Status: DC | PRN
  Administered 2017-08-01: 2 mg via INTRAVENOUS

## 2017-08-01 MED ORDER — LACTATED RINGERS IV SOLN
INTRAVENOUS | Status: DC
  Administered 2017-08-01 (×2): via INTRAVENOUS

## 2017-08-01 MED ORDER — KETOROLAC TROMETHAMINE 30 MG/ML IJ SOLN
INTRAMUSCULAR | Status: AC
  Filled 2017-08-01: qty 1

## 2017-08-01 MED ORDER — FENTANYL CITRATE (PF) 100 MCG/2ML IJ SOLN
INTRAMUSCULAR | Status: DC | PRN
  Administered 2017-08-01: 50 ug via INTRAVENOUS
  Administered 2017-08-01: 25 ug via INTRAVENOUS
  Administered 2017-08-01: 100 ug via INTRAVENOUS
  Administered 2017-08-01: 25 ug via INTRAVENOUS

## 2017-08-01 MED ORDER — LIDOCAINE HCL (CARDIAC) 10 MG/ML IV SOLN
INTRAVENOUS | Status: DC | PRN
  Administered 2017-08-01: 40 mg via INTRAVENOUS

## 2017-08-01 MED ORDER — LIDOCAINE VISCOUS 2 % MT SOLN
OROMUCOSAL | Status: DC | PRN
  Administered 2017-08-01: 1

## 2017-08-01 SURGICAL SUPPLY — 31 items
BAG HAMPER (MISCELLANEOUS) ×3 IMPLANT
CLOTH BEACON ORANGE TIMEOUT ST (SAFETY) ×3 IMPLANT
COVER LIGHT HANDLE STERIS (MISCELLANEOUS) ×6 IMPLANT
DECANTER SPIKE VIAL GLASS SM (MISCELLANEOUS) ×3 IMPLANT
DRAPE HALF SHEET 40X57 (DRAPES) ×3 IMPLANT
DRAPE PROXIMA HALF (DRAPES) ×3 IMPLANT
ELECT REM PT RETURN 9FT ADLT (ELECTROSURGICAL) ×3
ELECTRODE REM PT RTRN 9FT ADLT (ELECTROSURGICAL) ×1 IMPLANT
GAUZE SPONGE 4X4 12PLY STRL (GAUZE/BANDAGES/DRESSINGS) ×4 IMPLANT
GLOVE BIOGEL PI IND STRL 6.5 (GLOVE) IMPLANT
GLOVE BIOGEL PI IND STRL 7.0 (GLOVE) ×1 IMPLANT
GLOVE BIOGEL PI INDICATOR 6.5 (GLOVE) ×2
GLOVE BIOGEL PI INDICATOR 7.0 (GLOVE) ×2
GLOVE SURG SS PI 6.5 STRL IVOR (GLOVE) ×2 IMPLANT
GLOVE SURG SS PI 7.5 STRL IVOR (GLOVE) ×6 IMPLANT
GOWN STRL REUS W/ TWL XL LVL3 (GOWN DISPOSABLE) ×1 IMPLANT
GOWN STRL REUS W/TWL LRG LVL3 (GOWN DISPOSABLE) ×3 IMPLANT
GOWN STRL REUS W/TWL XL LVL3 (GOWN DISPOSABLE) ×3
HEMOSTAT SURGICEL 4X8 (HEMOSTASIS) ×3 IMPLANT
KIT ROOM TURNOVER AP CYSTO (KITS) ×3 IMPLANT
LIGASURE IMPACT 36 18CM CVD LR (INSTRUMENTS) ×3 IMPLANT
MANIFOLD NEPTUNE II (INSTRUMENTS) ×3 IMPLANT
NEEDLE HYPO 22GX1.5 SAFETY (NEEDLE) ×3 IMPLANT
NS IRRIG 1000ML POUR BTL (IV SOLUTION) ×3 IMPLANT
PACK PERI GYN (CUSTOM PROCEDURE TRAY) ×3 IMPLANT
PAD ARMBOARD 7.5X6 YLW CONV (MISCELLANEOUS) ×3 IMPLANT
SET BASIN LINEN APH (SET/KITS/TRAYS/PACK) ×3 IMPLANT
SURGILUBE 3G PEEL PACK STRL (MISCELLANEOUS) ×3 IMPLANT
SUT CHROMIC 2 0 SH (SUTURE) ×2 IMPLANT
SUT SILK 0 FSL (SUTURE) ×2 IMPLANT
SYR 20CC LL (SYRINGE) ×3 IMPLANT

## 2017-08-01 NOTE — Interval H&P Note (Signed)
History and Physical Interval Note:  08/01/2017 11:32 AM  Joshua Robertson  has presented today for surgery, with the diagnosis of thrombosed hemorrhoids  The various methods of treatment have been discussed with the patient and family. After consideration of risks, benefits and other options for treatment, the patient has consented to  Procedure(s): SIMPLE HEMORRHOIDECTOMY (N/A) as a surgical intervention .  The patient's history has been reviewed, patient examined, no change in status, stable for surgery.  I have reviewed the patient's chart and labs.  Questions were answered to the patient's satisfaction.     Aviva Signs

## 2017-08-01 NOTE — OR Nursing (Signed)
Rectal packing removed intact. Only old blood on packing.

## 2017-08-01 NOTE — OR Nursing (Signed)
Talked with patient at length about blood pressure. He states he has been on multiple meds for his blood pressure and it has been "much higher than that. " Advised him to go back and see PCP about pressure. He verbalized understanding and states he will do that.

## 2017-08-01 NOTE — Op Note (Signed)
Patient:  Joshua Robertson  DOB:  02/25/1976  MRN:  [id number]   Preop Diagnosis: Thrombosed hemorrhoidal disease  Postop Diagnosis: Same  Procedure: Extensive hemorrhoidectomy  Surgeon: Aviva Signs, MD  Anes: General  Indications: Patient is a 42 year old black male with Crohn's disease who presents with a nonenlarged thrombosed external hemorrhoid.  Digital examination preoperatively was limited secondary to pain.  Patient now comes to the operating room for hemorrhoidectomy.  The risks and benefits of the procedure including bleeding, infection, and recurrence of the hemorrhoidal disease were fully explained to the patient, who gave informed consent.  Procedure note: The patient was placed in the lithotomy position after general anesthesia was administered.  The perineum was prepped and draped using the usual sterile technique with technic care.  Surgical site confirmation was performed.  On rectal examination, the patient had a large thrombosed external hemorrhoid at the 7 o'clock position with an associated internal hemorrhoid with it.  A smaller external thrombosed hemorrhoid was noted at the 9 o'clock position.  This 1 was excised using the LigaSure without difficulty.  The 7:00 internal and external hemorrhoid were excised in continuity in a collar-like fashion using the LigaSure.  Several 2-0 chromic gut figure-of-eight sutures were placed deep within the rectum to reapproximate the mucosa.  Care was taken to avoid the sphincter mechanism.  Exparel was instilled into the surrounding perineum.  Surgicel and Viscous Xylocaine rectal packing was placed.  All tape and needle counts were correct at the end of the procedure.  The patient was extubated in the operating room and transferred to PACU in stable condition.  Complications: None  EBL: Minimal  Specimen: Hemorrhoids

## 2017-08-01 NOTE — Anesthesia Postprocedure Evaluation (Signed)
Anesthesia Post Note  Patient: Joshua Robertson  Procedure(s) Performed: HEMORRHOIDECTOMY (N/A )  Patient location during evaluation: PACU Anesthesia Type: General Level of consciousness: awake, awake and alert, oriented and patient cooperative Pain management: pain level controlled Vital Signs Assessment: post-procedure vital signs reviewed and stable Respiratory status: spontaneous breathing, patient connected to nasal cannula oxygen, nonlabored ventilation and respiratory function stable Cardiovascular status: stable Postop Assessment: no apparent nausea or vomiting Anesthetic complications: no     Last Vitals:  Vitals:   08/01/17 1140 08/01/17 1145  BP: (!) 158/104 (!) 150/97  Pulse:    Resp: (!) 29 16  Temp:    SpO2: 100% 100%    Last Pain:  Vitals:   08/01/17 0836  TempSrc: Oral  PainSc: 0-No pain                 Perrin Gens L

## 2017-08-01 NOTE — Discharge Instructions (Signed)
Surgical Procedures for Hemorrhoids, Care After Refer to this sheet in the next few weeks. These instructions provide you with information about caring for yourself after your procedure. Your health care provider may also give you more specific instructions. Your treatment has been planned according to current medical practices, but problems sometimes occur. Call your health care provider if you have any problems or questions after your procedure. What can I expect after the procedure? After the procedure, it is common to have:  Rectal pain.  Pain when you are having a bowel movement.  Slight rectal bleeding.  Follow these instructions at home: Medicines  Take over-the-counter and prescription medicines only as told by your health care provider.  Do not drive or operate heavy machinery while taking prescription pain medicine.  Use a stool softener or a bulk laxative as told by your health care provider. Activity  Rest at home. Return to your normal activities as told by your health care provider.  Do not lift anything that is heavier than 10 lb (4.5 kg).  Do not sit for long periods of time. Take a walk every day or as told by your health care provider.  Do not strain to have a bowel movement. Do not spend a long time sitting on the toilet. Eating and drinking  Eat foods that contain fiber, such as whole grains, beans, nuts, fruits, and vegetables.  Drink enough fluid to keep your urine clear or pale yellow. General instructions  Sit in a warm bath 2-3 times per day to relieve soreness or itching.  Keep all follow-up visits as told by your health care provider. This is important. Contact a health care provider if:  Your pain medicine is not helping.  You have a fever or chills.  You become constipated.  You have trouble passing urine. Get help right away if:  You have very bad rectal pain.  You have heavy bleeding from your rectum. This information is not intended  to replace advice given to you by your health care provider. Make sure you discuss any questions you have with your health care provider. Document Released: 08/21/2003 Document Revised: 11/06/2015 Document Reviewed: 08/26/2014 Elsevier Interactive Patient Education  2018 [place] Anesthesia, Adult, Care After These instructions provide you with information about caring for yourself after your procedure. Your health care provider may also give you more specific instructions. Your treatment has been planned according to current medical practices, but problems sometimes occur. Call your health care provider if you have any problems or questions after your procedure. What can I expect after the procedure? After the procedure, it is common to have:  Vomiting.  A sore throat.  Mental slowness.  It is common to feel:  Nauseous.  Cold or shivery.  Sleepy.  Tired.  Sore or achy, even in parts of your body where you did not have surgery.  Follow these instructions at home: For at least 24 hours after the procedure:  Do not: ? Participate in activities where you could fall or become injured. ? Drive. ? Use heavy machinery. ? Drink alcohol. ? Take sleeping pills or medicines that cause drowsiness. ? Make important decisions or sign legal documents. ? Take care of children on your own.  Rest. Eating and drinking  If you vomit, drink water, juice, or soup when you can drink without vomiting.  Drink enough fluid to keep your urine clear or pale yellow.  Make sure you have little or no nausea before eating  solid foods.  Follow the diet recommended by your health care provider. General instructions  Have a responsible adult stay with you until you are awake and alert.  Return to your normal activities as told by your health care provider. Ask your health care provider what activities are safe for you.  Take over-the-counter and prescription medicines only as  told by your health care provider.  If you smoke, do not smoke without supervision.  Keep all follow-up visits as told by your health care provider. This is important. Contact a health care provider if:  You continue to have nausea or vomiting at home, and medicines are not helpful.  You cannot drink fluids or start eating again.  You cannot urinate after 8-12 hours.  You develop a skin rash.  You have fever.  You have increasing redness at the site of your procedure. Get help right away if:  You have difficulty breathing.  You have chest pain.  You have unexpected bleeding.  You feel that you are having a life-threatening or urgent problem. This information is not intended to replace advice given to you by your health care provider. Make sure you discuss any questions you have with your health care provider. Document Released: 09/06/2000 Document Revised: 11/03/2015 Document Reviewed: 05/15/2015 Elsevier Interactive Patient Education  [employer].

## 2017-08-01 NOTE — Transfer of Care (Signed)
Immediate Anesthesia Transfer of Care Note  Patient: Joshua Robertson  Procedure(s) Performed: HEMORRHOIDECTOMY (N/A )  Patient Location: PACU  Anesthesia Type:General  Level of Consciousness: awake, alert , oriented and patient cooperative  Airway & Oxygen Therapy: Patient Spontanous Breathing and Patient connected to nasal cannula oxygen  Post-op Assessment: Report given to RN and Post -op Vital signs reviewed and stable  Post vital signs: Reviewed and stable  Last Vitals:  Vitals:   08/01/17 1140 08/01/17 1145  BP: (!) 158/104 (!) 150/97  Pulse:    Resp: (!) 29 16  Temp:    SpO2: 100% 100%    Last Pain:  Vitals:   08/01/17 0836  TempSrc: Oral  PainSc: 0-No pain         Complications: No apparent anesthesia complications

## 2017-08-01 NOTE — Anesthesia Preprocedure Evaluation (Signed)
Anesthesia Evaluation  Patient identified by MRN, date of birth, ID band Patient awake    Reviewed: Allergy & Precautions, NPO status , Patient's Chart, lab work & pertinent test results  Airway Mallampati: I  TM Distance: >3 FB Neck ROM: Full    Dental  (+) Teeth Intact   Pulmonary Current Smoker,    breath sounds clear to auscultation       Cardiovascular hypertension,  Rhythm:Regular Rate:Normal     Neuro/Psych  Headaches,    GI/Hepatic (+)     substance abuse  marijuana use,   Endo/Other    Renal/GU      Musculoskeletal   Abdominal   Peds  Hematology  (+) anemia ,   Anesthesia Other Findings   Reproductive/Obstetrics                             Anesthesia Physical Anesthesia Plan  ASA: III  Anesthesia Plan: General   Post-op Pain Management:    Induction: Intravenous  PONV Risk Score and Plan:   Airway Management Planned: LMA  Additional Equipment:   Intra-op Plan:   Post-operative Plan: Extubation in OR  Informed Consent: I have reviewed the patients History and Physical, chart, labs and discussed the procedure including the risks, benefits and alternatives for the proposed anesthesia with the patient or authorized representative who has indicated his/her understanding and acceptance.     Plan Discussed with:   Anesthesia Plan Comments:         Anesthesia Quick Evaluation

## 2017-08-01 NOTE — OR Nursing (Signed)
Dr Arnoldo Morale and Dr Patsey Berthold aware of blood pressure. Packing removed per order and BP did come down some. Patient pain is more tolerable now.

## 2017-08-01 NOTE — Anesthesia Procedure Notes (Signed)
Procedure Name: LMA Insertion Date/Time: 08/01/2017 12:15 PM Performed by: Raenette Rover, CRNA Pre-anesthesia Checklist: Patient identified, Emergency Drugs available, Suction available and Patient being monitored Patient Re-evaluated:Patient Re-evaluated prior to induction Oxygen Delivery Method: Circle system utilized Preoxygenation: Pre-oxygenation with 100% oxygen Induction Type: IV induction LMA: LMA inserted LMA Size: 4.0 Number of attempts: 1 Placement Confirmation: positive ETCO2,  CO2 detector and breath sounds checked- equal and bilateral Tube secured with: Tape Dental Injury: Teeth and Oropharynx as per pre-operative assessment

## 2017-08-02 ENCOUNTER — Encounter (HOSPITAL_COMMUNITY): Payer: Self-pay | Admitting: General Surgery

## 2017-08-09 ENCOUNTER — Ambulatory Visit (INDEPENDENT_AMBULATORY_CARE_PROVIDER_SITE_OTHER): Payer: Self-pay | Admitting: General Surgery

## 2017-08-09 ENCOUNTER — Encounter: Payer: Self-pay | Admitting: General Surgery

## 2017-08-09 VITALS — BP 150/100 | HR 86 | Temp 98.7°F | Ht 70.0 in | Wt 135.0 lb

## 2017-08-09 DIAGNOSIS — Z09 Encounter for follow-up examination after completed treatment for conditions other than malignant neoplasm: Secondary | ICD-10-CM

## 2017-08-09 MED ORDER — HYDROCODONE-ACETAMINOPHEN 5-325 MG PO TABS: 1.0000 | ORAL_TABLET | ORAL | 0 refills | Status: DC | PRN

## 2017-08-09 NOTE — Progress Notes (Signed)
Subjective:     Joshua Robertson  Status post extensive hemorrhoidectomy.  Patient states he is still having 8 out of 10 rectal pain.  It is somewhat easing.  He has noted some blood per rectum, but this is decreasing.  No clots of blood are noted on the toilet bowl.  He is taking stool softeners to avoid constipation. Objective:    BP (!) 150/100   Pulse 86   Temp 98.7 F (37.1 C)   Ht 5' 10"  (1.778 m)   Wt 135 lb (61.2 kg)   BMI 19.37 kg/m   General:  alert, cooperative and no distress  Rectum healing well.  No gross bleeding noted. Final pathology consistent with diagnosis.     Assessment:    Doing well postoperatively. Normal postoperative pain.    Plan:  Have reordered Norco for pain.  Patient may soak in tub as needed.  Follow-up here in 3 weeks.  Reassured patient that the pain should be resolving with time.

## 2017-08-11 DIAGNOSIS — L301 Dyshidrosis [pompholyx]: Secondary | ICD-10-CM | POA: Diagnosis not present

## 2017-08-18 DIAGNOSIS — M25471 Effusion, right ankle: Secondary | ICD-10-CM | POA: Diagnosis not present

## 2017-08-18 DIAGNOSIS — M2550 Pain in unspecified joint: Secondary | ICD-10-CM | POA: Diagnosis not present

## 2017-08-18 DIAGNOSIS — R5383 Other fatigue: Secondary | ICD-10-CM | POA: Diagnosis not present

## 2017-08-18 DIAGNOSIS — Z681 Body mass index (BMI) 19 or less, adult: Secondary | ICD-10-CM | POA: Diagnosis not present

## 2017-08-18 DIAGNOSIS — K50111 Crohn's disease of large intestine with rectal bleeding: Secondary | ICD-10-CM | POA: Diagnosis not present

## 2017-08-30 ENCOUNTER — Ambulatory Visit (INDEPENDENT_AMBULATORY_CARE_PROVIDER_SITE_OTHER): Payer: Self-pay | Admitting: General Surgery

## 2017-08-30 ENCOUNTER — Encounter: Payer: Self-pay | Admitting: General Surgery

## 2017-08-30 VITALS — BP 137/86 | HR 76 | Temp 98.7°F | Ht 70.0 in | Wt 139.0 lb

## 2017-08-30 DIAGNOSIS — Z09 Encounter for follow-up examination after completed treatment for conditions other than malignant neoplasm: Secondary | ICD-10-CM

## 2017-08-30 NOTE — Progress Notes (Signed)
Subjective:     Joshua Robertson  Status post hemorrhoidectomy.  Doing well.  States he has improved significantly since the last visit. Objective:    BP 137/86   Pulse 76   Temp 98.7 F (37.1 C)   Ht 5' 10"  (1.778 m)   Wt 139 lb (63 kg)   BMI 19.94 kg/m   General:  alert, cooperative and no distress       Assessment:    Doing well postoperatively.    Plan:   Follow-up as needed.

## 2017-08-31 ENCOUNTER — Encounter (HOSPITAL_COMMUNITY)
Admission: RE | Admit: 2017-08-31 | Discharge: 2017-08-31 | Disposition: A | Discharge status: Home / Self Care | Payer: Medicare Other | Source: Ambulatory Visit | Attending: Gastroenterology | Admitting: Gastroenterology

## 2017-08-31 ENCOUNTER — Encounter (HOSPITAL_COMMUNITY): Payer: Self-pay

## 2017-09-02 ENCOUNTER — Telehealth: Payer: Self-pay | Admitting: Gastroenterology

## 2017-09-02 NOTE — Telephone Encounter (Signed)
FYI to Dr. Oneida Alar.

## 2017-09-02 NOTE — Telephone Encounter (Signed)
Pt's mother called asking for SF or AB. I told her that neither one was in the office today and I could take a message for the nurse. She said that patient's BP was running really high ?227/175 and they got as far as [facility]. She wanted SF to be aware and that Franco needed to get his medications straightened out. I told the mother that I would send a message and also wanted her to [facility] for DS as well.

## 2017-09-02 NOTE — Telephone Encounter (Signed)
PLEASE CALL PT'S FAMILY. HE NEEDS HIS PCP TO MANAGE HIS BP. WE ALREADY HAVE A PLAN FOR HIS CROHN'S DISEASE.

## 2017-09-02 NOTE — Telephone Encounter (Signed)
I called Joshua Robertson and informed her, she said she was sorry, we could not understand her well. She was talking about Joshua Robertson. ( Please see [name]'s chart).

## 2017-09-02 NOTE — Telephone Encounter (Signed)
VM from pt's mom, same info as to [place]. Also said pt was having pain in his left arm and was very sweaty. Said they live in [place] and she could not make it to get to [facility] with his BP so elevated.  Said she wanted to get his meds straightened out.

## 2017-09-02 NOTE — Telephone Encounter (Signed)
REVIEWED-NO ADDITIONAL RECOMMENDATIONS. 

## 2017-09-05 ENCOUNTER — Telehealth: Payer: Self-pay | Admitting: Gastroenterology

## 2017-09-05 NOTE — Telephone Encounter (Signed)
Called pt. He ate supper last night because he lost his instructions. States he hasn't ate anything at all today. Has drank 1st dose of prep. Spoke with CM. Pt can still have procedure tomorrow since he hasn't ate today. Called pt back. Advised him to do clear liquids only and drink 2nd bottle of prep tomorrow morning at time on instructions.

## 2017-09-05 NOTE — Telephone Encounter (Signed)
Pt came by earlier to pick up another copy of his prep instructions. He is scheduled for tomorrow with SF. The girlfriend called later to say that after reading the instructions that Imani was to have started the prep yesterday and was there something else he could take to begin his prep. Please call [phone]

## 2017-09-06 ENCOUNTER — Ambulatory Visit (HOSPITAL_COMMUNITY): Payer: Medicare Other | Admitting: Anesthesiology

## 2017-09-06 ENCOUNTER — Encounter (HOSPITAL_COMMUNITY)
Admission: RE | Disposition: A | Discharge status: Home / Self Care | Payer: Self-pay | Source: Ambulatory Visit | Attending: Gastroenterology

## 2017-09-06 ENCOUNTER — Encounter (HOSPITAL_COMMUNITY): Payer: Self-pay | Admitting: *Deleted

## 2017-09-06 ENCOUNTER — Ambulatory Visit (HOSPITAL_COMMUNITY)
Admission: RE | Admit: 2017-09-06 | Discharge: 2017-09-06 | Disposition: A | Discharge status: Home / Self Care | Payer: Medicare Other | Source: Ambulatory Visit | Attending: Gastroenterology | Admitting: Gastroenterology

## 2017-09-06 DIAGNOSIS — Z880 Allergy status to penicillin: Secondary | ICD-10-CM | POA: Insufficient documentation

## 2017-09-06 DIAGNOSIS — I10 Essential (primary) hypertension: Secondary | ICD-10-CM | POA: Diagnosis not present

## 2017-09-06 DIAGNOSIS — F17210 Nicotine dependence, cigarettes, uncomplicated: Secondary | ICD-10-CM | POA: Insufficient documentation

## 2017-09-06 DIAGNOSIS — Z79899 Other long term (current) drug therapy: Secondary | ICD-10-CM | POA: Insufficient documentation

## 2017-09-06 DIAGNOSIS — K5010 Crohn's disease of large intestine without complications: Secondary | ICD-10-CM | POA: Diagnosis not present

## 2017-09-06 DIAGNOSIS — K633 Ulcer of intestine: Secondary | ICD-10-CM | POA: Insufficient documentation

## 2017-09-06 DIAGNOSIS — Q438 Other specified congenital malformations of intestine: Secondary | ICD-10-CM | POA: Diagnosis not present

## 2017-09-06 DIAGNOSIS — Z888 Allergy status to other drugs, medicaments and biological substances status: Secondary | ICD-10-CM | POA: Diagnosis not present

## 2017-09-06 DIAGNOSIS — K5080 Crohn's disease of both small and large intestine without complications: Secondary | ICD-10-CM | POA: Insufficient documentation

## 2017-09-06 DIAGNOSIS — K50812 Crohn's disease of both small and large intestine with intestinal obstruction: Secondary | ICD-10-CM | POA: Diagnosis not present

## 2017-09-06 DIAGNOSIS — K644 Residual hemorrhoidal skin tags: Secondary | ICD-10-CM | POA: Diagnosis not present

## 2017-09-06 DIAGNOSIS — R197 Diarrhea, unspecified: Secondary | ICD-10-CM

## 2017-09-06 DIAGNOSIS — D573 Sickle-cell trait: Secondary | ICD-10-CM | POA: Diagnosis not present

## 2017-09-06 DIAGNOSIS — Z91041 Radiographic dye allergy status: Secondary | ICD-10-CM | POA: Diagnosis not present

## 2017-09-06 HISTORY — PX: BIOPSY: SHX5522

## 2017-09-06 HISTORY — PX: COLONOSCOPY WITH PROPOFOL: SHX5780

## 2017-09-06 LAB — VITAMIN B12: Vitamin B-12: 195 pg/mL (ref 180–914)

## 2017-09-06 SURGERY — COLONOSCOPY WITH PROPOFOL
Anesthesia: Monitor Anesthesia Care

## 2017-09-06 MED ORDER — FENTANYL CITRATE (PF) 100 MCG/2ML IJ SOLN
INTRAMUSCULAR | Status: AC
  Filled 2017-09-06: qty 2

## 2017-09-06 MED ORDER — MIDAZOLAM HCL 2 MG/2ML IJ SOLN
INTRAMUSCULAR | Status: AC
  Filled 2017-09-06: qty 2

## 2017-09-06 MED ORDER — LACTATED RINGERS IV SOLN
INTRAVENOUS | Status: DC
  Administered 2017-09-06: 08:00:00 via INTRAVENOUS

## 2017-09-06 MED ORDER — MIDAZOLAM HCL 2 MG/2ML IJ SOLN
1.0000 mg | INTRAMUSCULAR | Status: AC
  Administered 2017-09-06: 2 mg via INTRAVENOUS

## 2017-09-06 MED ORDER — PROPOFOL 10 MG/ML IV BOLUS
INTRAVENOUS | Status: AC
  Filled 2017-09-06: qty 40

## 2017-09-06 MED ORDER — PROPOFOL 10 MG/ML IV BOLUS
INTRAVENOUS | Status: AC
  Filled 2017-09-06: qty 20

## 2017-09-06 MED ORDER — CHLORHEXIDINE GLUCONATE CLOTH 2 % EX PADS: 6.0000 | MEDICATED_PAD | Freq: Once | CUTANEOUS | Status: DC

## 2017-09-06 MED ORDER — MIDAZOLAM HCL 5 MG/5ML IJ SOLN
INTRAMUSCULAR | Status: DC | PRN
  Administered 2017-09-06: 2 mg via INTRAVENOUS

## 2017-09-06 MED ORDER — PROPOFOL 500 MG/50ML IV EMUL
INTRAVENOUS | Status: DC | PRN
  Administered 2017-09-06: 125 ug/kg/min via INTRAVENOUS
  Administered 2017-09-06: 09:00:00 via INTRAVENOUS

## 2017-09-06 MED ORDER — FENTANYL CITRATE (PF) 100 MCG/2ML IJ SOLN
25.0000 ug | Freq: Once | INTRAMUSCULAR | Status: AC
  Administered 2017-09-06: 25 ug via INTRAVENOUS

## 2017-09-06 NOTE — H&P (Signed)
Primary Care Physician:  Caryl Bis, MD Primary Gastroenterologist:  Dr. Oneida Alar  Pre-Procedure History & Physical: HPI:  Joshua Robertson is a 42 y.o. male here for Diarrhea/PMHx: Crohn's disease.  Past Medical History:  Diagnosis Date  . BMI between 19-24,adult JAN 2010 149 LBS  . Crohn's disease ([place]) AUG 2009 NUR [place]   HB 9.4 FERRITIN 26  140 LBS; no serologies-pt can't afford test  . Helicobacter pylori gastritis 2009  . Hypertension   . Migraine headache    NONE SINCE AGE 61  . Sacroiliitis ([place]) 2009  . Sickle cell trait ([place])   . Spondyloarthropathy ([place]) 2009 EROSIVE    Past Surgical History:  Procedure Laterality Date  . BACK SURGERY     2018  outpatient x2  . COLON RESECTION     due to chrohn's  . COLONOSCOPY  03/2015   [facility]duard Roux [facility]: Crohn's like stricture at ICV. Stricture was not traversable. abnormal mucosa of ICV. Bx, c/w Crohn's.   . COLONOSCOPY N/A 07/30/2013   stenotic IC VALVE. UNABLE TO INTUBATE ILEUM.  Marland Kitchen HEMORRHOID SURGERY N/A 08/01/2017   Procedure: HEMORRHOIDECTOMY;  Surgeon: Aviva Signs, MD;  Location: [facility];  Service: General;  Laterality: N/A;  . LUMBAR LAMINECTOMY/DECOMPRESSION MICRODISCECTOMY Right 02/04/2017   Procedure: Microdiscectomy - Lumbar four-five - right redo;  Surgeon: Kary Kos, MD;  Location: [place];  Service: Neurosurgery;  Laterality: Right;    Prior to Admission medications   Medication Sig Start Date End Date Taking? Authorizing Provider  cholecalciferol (VITAMIN D) 1000 units tablet Take 1 tablet (1,000 Units total) by mouth daily. BEGIN AFTER WEEKLY VIT D DOSING IS COMPLETE 04/11/16  Yes Vaibhav Fogleman L, MD  cloNIDine (CATAPRES) 0.1 MG tablet Take 1 tablet (0.1 mg total) by mouth 2 (two) times daily. 08/09/12  Yes Kymari Lollis, Marga Melnick, MD  HYDROcodone-acetaminophen (NORCO) 5-325 MG tablet Take 1 tablet by mouth every 4 (four) hours as needed for moderate pain. 08/09/17  Yes Aviva Signs, MD  Multiple Vitamin (MULTIVITAMIN WITH  MINERALS) TABS tablet Take 1 tablet by mouth daily. Doesn't take everyday   Yes [provider]  Na Sulfate-K Sulfate-Mg Sulf 17.5-3.13-1.6 GM/177ML SOLN Take 1 kit by mouth as directed. 07/27/17  Yes Deforest Maiden L, MD  clotrimazole (LOTRIMIN) 1 % cream Apply topically 3 (three) times daily. FOR 10 DAYS 07/27/17   Felcia Huebert, Marga Melnick, MD  cyclobenzaprine (FLEXERIL) 10 MG tablet Take 10 mg by mouth as needed for muscle spasms.    [provider]  methocarbamol (ROBAXIN) 500 MG tablet Take 1 tablet (500 mg total) by mouth 2 (two) times daily. Patient not taking: Reported on 11/11/2016 07/01/16   [name], PA-C  ondansetron (ZOFRAN ODT) 4 MG disintegrating tablet Take 1 tablet (4 mg total) by mouth every 8 (eight) hours as needed for nausea. Patient not taking: Reported on 07/27/2017 07/27/17   [name], MD  predniSONE (DELTASONE) 10 MG tablet Take 4 tablets (40 mg total) by mouth daily with breakfast. 36m daily for 5 days 251mdaily for 5 days 1063maily for 5 days 5mg2mily for 5 days. Patient not taking: Reported on 11/11/2016 09/16/16   EmokBethena Roys    Allergies as of 07/27/2017 - Review Complete 07/27/2017  Allergen Reaction Noted  . Penicillins Swelling and Rash 08/15/2008  . Azathioprine Nausea And Vomiting 11/25/2009  . Iodinated diagnostic agents Nausea And Vomiting 08/20/2013  . Metrizamide Nausea And Vomiting 08/20/2013  . Pentasa [mesalamine er] Rash 07/11/2013  Family History  Problem Relation Age of Onset  . Hypertension Mother   . Hypertension Father   . Colon cancer Neg Hx   . Colon polyps Neg Hx   . Inflammatory bowel disease Neg Hx     Social History   Socioeconomic History  . Marital status: Single    Spouse name: Not on file  . Number of children: Not on file  . Years of education: Not on file  . Highest education level: Not on file  Occupational History  . Occupation: unemployed  Social Needs  . Financial resource strain:  Not on file  . Food insecurity:    Worry: Not on file    Inability: Not on file  . Transportation needs:    Medical: Not on file    Non-medical: Not on file  Tobacco Use  . Smoking status: Current Every Day Smoker    Packs/day: 0.25    Types: Cigarettes  . Smokeless tobacco: Never Used  . Tobacco comment: 6 cig daily as of 07/29/2017  Substance and Sexual Activity  . Alcohol use: No    Alcohol/week: 0.0 oz    Frequency: Never    Comment: very rare  . Drug use: Yes    Types: Marijuana    Comment: 2-3 times daily Helps appetite and stomach.  . Sexual activity: Yes    Birth control/protection: None  Lifestyle  . Physical activity:    Days per week: Not on file    Minutes per session: Not on file  . Stress: Not on file  Relationships  . Social connections:    Talks on phone: Not on file    Gets together: Not on file    Attends religious service: Not on file    Active member of club or organization: Not on file    Attends meetings of clubs or organizations: Not on file    Relationship status: Not on file  . Intimate partner violence:    Fear of current or ex partner: Not on file    Emotionally abused: Not on file    Physically abused: Not on file    Forced sexual activity: Not on file  Other Topics Concern  . Not on file  Social History Narrative   Smokes cigs  & pot. Has one girl AGE 67: DESTINY.    Review of Systems: See HPI, otherwise negative ROS   Physical Exam: BP 130/87   Pulse 62   Temp 97.9 F (36.6 C) (Oral)   Ht _0  (1.778 m)   Wt 139 lb (63 kg)   SpO2 99%   BMI 19.94 kg/m  General:   Alert,  pleasant and cooperative in NAD Head:  Normocephalic and atraumatic. Neck:  Supple; Lungs:  Clear throughout to auscultation.    Heart:  Regular rate and rhythm. Abdomen:  Soft, nontender and nondistended. Normal bowel sounds, without guarding, and without rebound.   Neurologic:  Alert and  oriented x4;  grossly normal neurologically.  Impression/Plan:      Diarrhea/PMHx: Crohn's disease  PLAN: TCS TODAY WITH BIOPSY. DISCUSSED PROCEDURE, BENEFITS, & RISKS: < 1% chance of medication reaction, bleeding, perforation, or rupture of spleen/liver.

## 2017-09-06 NOTE — Op Note (Signed)
[facility] Patient Name: Joshua Robertson Procedure Date: 09/06/2017 8:33 AM MRN: [id number] Date of Birth: [birth year] Attending MD: Barney Drain MD, MD CSN: [id number] Age: 42 Admit Type: Outpatient Procedure:                Colonoscopy WITH COLD FORCEPS BIOPSY Indications:              Follow-up endoscopy after surgery, Diarrhea                            following gastrointestinal surgery, Diarrhea                            (presumed secondary to Crohn's disease) Providers:                Barney Drain MD, MD, Otis Peak B. Sharon Seller, RN,                            Randa Spike, Technician Referring MD:             Mitzie Na. Daniel MD, MD Medicines:                Propofol per Anesthesia Complications:            No immediate complications. Estimated Blood Loss:     Estimated blood loss was minimal. Procedure:                Pre-Anesthesia Assessment:                           - Prior to the procedure, a History and Physical                            was performed, and patient medications and                            allergies were reviewed. The patient's tolerance of                            previous anesthesia was also reviewed. The risks                            and benefits of the procedure and the sedation                            options and risks were discussed with the patient.                            All questions were answered, and informed consent                            was obtained. Prior Anticoagulants: The patient has                            taken no previous anticoagulant or antiplatelet  agents. ASA Grade Assessment: II - A patient with                            mild systemic disease. After reviewing the risks                            and benefits, the patient was deemed in                            satisfactory condition to undergo the procedure.                            After obtaining informed consent, the  colonoscope                            was passed under direct vision. Throughout the                            procedure, the patient's blood pressure, pulse, and                            oxygen saturations were monitored continuously. The                            EC-[id number] ([id number]) scope was introduced through                            the anus and advanced to the the terminal ileum.                            The colonoscopy was somewhat difficult due to a                            tortuous colon. Successful completion of the                            procedure was aided by straightening and shortening                            the scope to obtain bowel loop reduction and                            COLOWRAP. The patient tolerated the procedure well.                            The quality of the bowel preparation was excellent.                            The terminal ileum and the rectum were photographed. Scope In: 9:02:39 AM Scope Out: 9:19:26 AM Scope Withdrawal Time: 0 hours 14 minutes 2 seconds  Total Procedure Duration: 0 hours 16 minutes 47 seconds  Findings:      The neo-terminal ileum contained multiple ulcers. No bleeding was  present. This was biopsied(BTL1) with a cold forceps for evaluation of       inflammatory bowel disease.      The recto-sigmoid colon and sigmoid colon were mildly tortuous. RANDOM       Biopsies were taken IN THE COLON(BTL2) AND RECTUM(BTL3)with a cold       forceps for histology.      External hemorrhoids were found during retroflexion. The hemorrhoids       were small. Impression:               - Multiple ulcers in the neo-terminal ileum DUE TO                            ACTIVE CROHN'S DISEASE                           - Tortuous LEFT colon. OTHERWISE NO EVIDENCE FOR                            ACTIVE DISEASE IN THE COLON OR RECTUM.                           - External hemorrhoids. Moderate Sedation:      Per Anesthesia  Care Recommendation:           - Repeat colonoscopy for surveillance based on                            pathology results.                           - Resume previous diet.                           - Continue present medications. WILL SUBMIT                            PAPERWORK TO START C[facility]. CONSIDER MTX IF NO                            RESPONSE AFTER 3-6 MOS.                           - Await pathology results.                           - Return to my office in 4 months.                           - Patient has a contact number available for                            emergencies. The signs and symptoms of potential                            delayed complications were discussed with the  patient. Return to normal activities tomorrow.                            Written discharge instructions were provided to the                            patient. Procedure Code(s):        --- Professional ---                           [phone], Colonoscopy, flexible; with biopsy, single                            or multiple Diagnosis Code(s):        --- Professional ---                           K63.3, Ulcer of intestine                           K64.4, Residual hemorrhoidal skin tags                           Q72, Encounter for follow-up examination after                            completed treatment for conditions other than                            malignant neoplasm                           K91.89, Other postprocedural complications and                            disorders of digestive system                           R19.7, Diarrhea, unspecified                           Q43.8, Other specified congenital malformations of                            intestine CPT copyright 2016 American Medical Association. All rights reserved. The codes documented in this report are preliminary and upon coder review may  be revised to meet current compliance requirements. Barney Drain, MD Barney Drain MD, MD 09/06/2017 9:32:25 AM This report has been signed electronically. Number of Addenda: 0

## 2017-09-06 NOTE — Transfer of Care (Signed)
Immediate Anesthesia Transfer of Care Note  Patient: Joshua Robertson  Procedure(s) Performed: COLONOSCOPY WITH PROPOFOL (N/A ) BIOPSY  Patient Location: PACU  Anesthesia Type:MAC  Level of Consciousness: drowsy and patient cooperative  Airway & Oxygen Therapy: Patient Spontanous Breathing and Patient connected to face mask oxygen  Post-op Assessment: Report given to RN and Post -op Vital signs reviewed and stable  Post vital signs: Reviewed and stable  Last Vitals:  Vitals Value Taken Time  BP    Temp    Pulse    Resp    SpO2      Last Pain:  Vitals:   09/06/17 0855  TempSrc:   PainSc: 0-No pain         Complications: No apparent anesthesia complications

## 2017-09-06 NOTE — Discharge Instructions (Signed)
You have moderately active CROHN'S DISEASE IN YOUR SMALL BOWEL.YOUR COLON AND RECTUM LOOK NORMAL.  I BIOPSIED YOUR SMALL BOWEL, COLON, AND RECTUM.    FOLLOW A DAIRY FREE DIET. See info below  I will submit paperwork to START [facility].  STOP USING TOBACCO PRODUCTS. IT MAKES CROHN'S HARD TO TREAT.  AVOID ASPIRIN, BC/GOODY POWDERS,  OR IBUPROFEN, MOTRIN, ALEVE, OR NAPROXEN.   TYLENOL OR VICODIN AS NEEDED FOR ABDOMINAL PAIN.   TAKE A PROBIOTIC DAILY([employer] BRAND, OR [facility] COLON HE[facility]). USE PROBIOTICS FOR AT LEAST 6 MOS.   PLEASE CALL WITH QUESTIONS OR CONCERNS.   I NEED TO RECHECK YOUR VITAMIN B12 AND HEPATITIS B BLOOD TESTS TODAY. FOLLOW UP IN 4 MOS.  Colonoscopy Care After Read the instructions outlined below and refer to this sheet in the next week. These discharge instructions provide you with general information on caring for yourself after you leave the hospital. While your treatment has been planned according to the most current medical practices available, unavoidable complications occasionally occur. If you have any problems or questions after discharge, call DR. Tuwana Kapaun, [phone].  ACTIVITY  You may resume your regular activity, but move at a slower pace for the next 24 hours.   Take frequent rest periods for the next 24 hours.   Walking will help get rid of the air and reduce the bloated feeling in your belly (abdomen).   No driving for 24 hours (because of the medicine (anesthesia) used during the test).   You may shower.   Do not sign any important legal documents or operate any machinery for 24 hours (because of the anesthesia used during the test).    NUTRITION  Drink plenty of fluids.   You may resume your normal diet as instructed by your doctor.   Begin with a light meal and progress to your normal diet. Heavy or fried foods are harder to digest and may make you feel sick to your stomach (nauseated).   Avoid alcoholic beverages for 24 hours or as  instructed.    MEDICATIONS  You may resume your normal medications.   WHAT YOU CAN EXPECT TODAY  Some feelings of bloating in the abdomen.   Passage of more gas than usual.   Spotting of blood in your stool or on the toilet paper  .  IF YOU HAD POLYPS REMOVED DURING THE COLONOSCOPY:  No aspirin products for 7 days or as instructed.   Eat a soft diet IF YOU HAVE NAUSEA, BLOATING, ABDOMINAL PAIN, OR VOMITING.    FINDING OUT THE RESULTS OF YOUR TEST Not all test results are available during your visit. DR. Oneida Alar WILL CALL YOU WITHIN 14 DAYS OF YOUR PROCEDUE WITH YOUR RESULTS. Do not assume everything is normal if you have not heard from DR. Gianni Fuchs, CALL HER OFFICE AT [phone].  SEEK IMMEDIATE MEDICAL ATTENTION AND CALL THE OFFICE: [phone] IF:  You have more than a spotting of blood in your stool.   Your belly is swollen (abdominal distention).   You are nauseated or vomiting.   You have a temperature over 101F.   You have abdominal pain or discomfort that is severe or gets worse throughout the day.    Lactose Free Diet Lactose is a carbohydrate that is found mainly in milk and milk products, as well as in foods with added milk or whey. Lactose must be digested by the enzyme in order to be used by the body. Lactose intolerance occurs when there is a shortage of lactase.  When your body is not able to digest lactose, you may feel sick to your stomach (nausea), bloating, cramping, gas and diarrhea.  There are many dairy products that may be tolerated better than milk by some people:  The use of cultured dairy products such as yogurt, buttermilk, cottage cheese, and sweet acidophilus milk (Kefir) for lactase-deficient individuals is usually well tolerated. This is because the healthy bacteria help digest lactose.   Lactose-hydrolyzed milk (Lactaid) contains 40-90% less lactose than milk and may also be well tolerated.    SPECIAL NOTES  Lactose is a  carbohydrates. The major food source is dairy products. Reading food labels is important. Many products contain lactose even when they are not made from milk. Look for the following words: whey, milk solids, dry milk solids, nonfat dry milk powder. Typical sources of lactose other than dairy products include breads, candies, cold cuts, prepared and processed foods, and commercial sauces and gravies.   All foods must be prepared without milk, cream, or other dairy foods.   Soy milk and lactose-free supplements (LACTASE) may be used as an alternative to milk.   FOOD GROUP ALLOWED/RECOMMENDED AVOID/USE SPARINGLY  BREADS / STARCHES 4 servings or more* Breads and rolls made without milk. [place], [place], or [place] bread. Breads and rolls that contain milk. Prepared mixes such as muffins, biscuits, waffles, pancakes. Sweet rolls, donuts, [place] toast (if made with milk or lactose).  Crackers: Soda crackers, graham crackers. Any crackers prepared without lactose. Zwieback crackers, corn curls, or any that contain lactose.  Cereals: Cooked or dry cereals prepared without lactose (read labels). Cooked or dry cereals prepared with lactose (read labels). Total, Cocoa Krispies. Special K.  Potatoes / Pasta / Rice: Any prepared without milk or lactose. Popcorn. Instant potatoes, frozen [place] fries, scalloped or au gratin potatoes.  VEGETABLES 2 servings or more Fresh, frozen, and canned vegetables. Creamed or breaded vegetables. Vegetables in a cheese sauce or with lactose-containing margarines.  FRUIT 2 servings or more All fresh, canned, or frozen fruits that are not processed with lactose. Any canned or frozen fruits processed with lactose.  MEAT & SUBSTITUTES 2 servings or more (4 to 6 oz. total per day) Plain beef, chicken, fish, [place], lamb, veal, pork, or ham. Kosher prepared meat products. Strained or junior meats that do not contain milk. Eggs, soy meat substitutes, nuts. Scrambled eggs, omelets,  and souffles that contain milk. Creamed or breaded meat, fish, or fowl. Sausage products such as wieners, liver sausage, or cold cuts that contain milk solids. Cheese, cottage cheese, or cheese spreads.  MILK None. (See BEVERAGES for milk substitutes. See DESSERTS for ice cream and frozen desserts.) Milk (whole, 2%, skim, or chocolate). Evaporated, powdered, or condensed milk; malted milk.  SOUPS & COMBINATION FOODS Bouillon, broth, vegetable soups, clear soups, consomms. Homemade soups made with allowed ingredients. Combination or prepared foods that do not contain milk or milk products (read labels). Cream soups, chowders, commercially prepared soups containing lactose. Macaroni and cheese, pizza. Combination or prepared foods that contain milk or milk products.  DESSERTS & SWEETS In moderation Water and fruit ices; gelatin; angel food cake. Homemade cookies, pies, or cakes made from allowed ingredients. Pudding (if made with water or a milk substitute). Lactose-free tofu desserts. Sugar, honey, corn syrup, jam, jelly; marmalade; molasses (beet sugar); Pure sugar candy; marshmallows. Ice cream, ice milk, sherbet, custard, pudding, frozen yogurt. Commercial cake and cookie mixes. Desserts that contain chocolate. Pie crust made with milk-containing margarine; reduced-calorie desserts made with a  sugar substitute that contains lactose. Toffee, peppermint, butterscotch, chocolate, caramels.  FATS & OILS In moderation Butter (as tolerated; contains very small amounts of lactose). Margarines and dressings that do not contain milk, Vegetable oils, shortening, Miracle Whip, mayonnaise, nondairy cream & whipped toppings without lactose or milk solids added (examples: Coffee Rich, Carnation Coffeemate, Rich's Whipped Topping, PolyRich). Berniece Salines. Margarines and salad dressings containing milk; cream, cream cheese; peanut butter with added milk solids, sour cream, chip dips, made with sour cream.  BEVERAGES  Carbonated drinks; tea; coffee and freeze-dried coffee; some instant coffees (check labels). Fruit drinks; fruit and vegetable juice; Rice or Soy milk. Ovaltine, hot chocolate. Some cocoas; some instant coffees; instant iced teas; powdered fruit drinks (read labels).   CONDIMENTS / MISCELLANEOUS Soy sauce, carob powder, olives, gravy made with water, baker's cocoa, pickles, pure seasonings and spices, wine, pure monosodium glutamate, catsup, mustard. Some chewing gums, chocolate, some cocoas. Certain antibiotics and vitamin / mineral preparations. Spice blends if they contain milk products. MSG extender. Artificial sweeteners that contain lactose such as Equal (Nutra-Sweet) and Sweet 'n Low. Some nondairy creamers (read labels).   SAMPLE MENU*  Breakfast   Orange Juice.  Banana.   Bran flakes.   Nondairy Creamer.  [place] Bread (toasted).   Butter or milk-free margarine.   Coffee or tea.    Noon Meal   Chicken Breast.  Rice.   Green beans.   Butter or milk-free margarine.  Fresh melon.   Coffee or tea.    Evening Meal   Roast Beef.  Baked potato.   Butter or milk-free margarine.   Broccoli.   Lettuce salad with vinegar and oil dressing.  [employer]   Coffee or tea.

## 2017-09-06 NOTE — Anesthesia Preprocedure Evaluation (Signed)
Anesthesia Evaluation  Patient identified by MRN, date of birth, ID band Patient awake    Reviewed: Allergy & Precautions, NPO status , Patient's Chart, lab work & pertinent test results  Airway Mallampati: I  TM Distance: >3 FB Neck ROM: Full    Dental  (+) Teeth Intact   Pulmonary Current Smoker,    breath sounds clear to auscultation       Cardiovascular hypertension,  Rhythm:Regular Rate:Normal     Neuro/Psych  Headaches,    GI/Hepatic (+)     substance abuse  marijuana use,   Endo/Other    Renal/GU      Musculoskeletal   Abdominal   Peds  Hematology  (+) anemia ,   Anesthesia Other Findings   Reproductive/Obstetrics                             Anesthesia Physical Anesthesia Plan  ASA: III  Anesthesia Plan: MAC   Post-op Pain Management:    Induction: Intravenous  PONV Risk Score and Plan:   Airway Management Planned: Simple Face Mask  Additional Equipment:   Intra-op Plan:   Post-operative Plan:   Informed Consent: I have reviewed the patients History and Physical, chart, labs and discussed the procedure including the risks, benefits and alternatives for the proposed anesthesia with the patient or authorized representative who has indicated his/her understanding and acceptance.     Plan Discussed with:   Anesthesia Plan Comments:         Anesthesia Quick Evaluation

## 2017-09-06 NOTE — Anesthesia Postprocedure Evaluation (Signed)
Anesthesia Post Note  Patient: Joshua Robertson  Procedure(s) Performed: COLONOSCOPY WITH PROPOFOL (N/A ) BIOPSY  Patient location during evaluation: PACU Anesthesia Type: MAC Level of consciousness: patient cooperative and lethargic Pain management: pain level controlled Vital Signs Assessment: post-procedure vital signs reviewed and stable Respiratory status: spontaneous breathing, nonlabored ventilation, respiratory function stable and patient connected to nasal cannula oxygen Cardiovascular status: blood pressure returned to baseline Postop Assessment: no apparent nausea or vomiting Anesthetic complications: no     Last Vitals:  Vitals:   09/06/17 0850 09/06/17 0930  BP: 132/89 (!) (P) 124/94  Pulse:    Resp: 20   Temp:  (P) 36.4 C  SpO2: 100%     Last Pain:  Vitals:   09/06/17 0855  TempSrc:   PainSc: 0-No pain                 Joshua Robertson

## 2017-09-07 LAB — HEPATITIS B SURFACE ANTIBODY,QUALITATIVE: HEP B S AB: REACTIVE

## 2017-09-07 LAB — HEPATITIS B SURFACE ANTIGEN: HEP B S AG: NEGATIVE

## 2017-09-07 LAB — HEPATITIS B CORE ANTIBODY, IGM: HEP B C IGM: NEGATIVE

## 2017-09-07 NOTE — Progress Notes (Signed)
Pt is aware.  

## 2017-09-07 NOTE — Progress Notes (Signed)
LMOM to call.

## 2017-09-08 ENCOUNTER — Telehealth: Payer: Self-pay

## 2017-09-08 DIAGNOSIS — M25471 Effusion, right ankle: Secondary | ICD-10-CM | POA: Diagnosis not present

## 2017-09-08 DIAGNOSIS — Z681 Body mass index (BMI) 19 or less, adult: Secondary | ICD-10-CM | POA: Diagnosis not present

## 2017-09-08 DIAGNOSIS — M0760 Enteropathic arthropathies, unspecified site: Secondary | ICD-10-CM | POA: Diagnosis not present

## 2017-09-08 DIAGNOSIS — M2550 Pain in unspecified joint: Secondary | ICD-10-CM | POA: Diagnosis not present

## 2017-09-08 DIAGNOSIS — K50111 Crohn's disease of large intestine with rectal bleeding: Secondary | ICD-10-CM | POA: Diagnosis not present

## 2017-09-08 NOTE — Telephone Encounter (Signed)
REVIEWED. PLEASE CALL RHEUMATOLOGY.PT HAS FAILED HUMIRA, REMICADE, AND ENTYVIO.

## 2017-09-08 NOTE — Telephone Encounter (Signed)
T/C from Joshua Robertson, [place] at [facility]. She is seeing pt there today and called to confirm Dr. Oneida Alar plans to start Cimzia in the near future. I told her that is what procedure note says. She said she was calling to let us know that pt is not comfortable with giving self injections and he would like to have it once a month at facility if that is possible.

## 2017-09-08 NOTE — Telephone Encounter (Signed)
Called [phone]) and Junie Panning was with a pt. I left the message with Cecille Rubin.

## 2017-09-08 NOTE — Telephone Encounter (Signed)
T/C from Amy at [facility] , said Junie Panning just wanted to confirm that Dr. Oneida Alar is doing the Cimzia order. I told her she said she was start the process and if any different I would let them know.

## 2017-09-08 NOTE — Telephone Encounter (Signed)
REVIEWED. AGREE. NO ADDITIONAL RECOMMENDATIONS. 

## 2017-09-09 ENCOUNTER — Encounter (HOSPITAL_COMMUNITY): Payer: Self-pay | Admitting: Gastroenterology

## 2017-09-10 NOTE — Telephone Encounter (Addendum)
Called patient TO DISCUSS CONCERNS. PT DOESN'T FEEL LIKE HE CAN GIVE HIMSELF A SHOT. HE TRIED HUMIRA AND WAS NOT SUCCESSFUL. SEEING DR. GRAY IN [place] AND CAN GIVE SHOTS IN OFC.  DISCUSSED RESULTS. RECOMMENDED VEGAN/VEGETARIAN DIET. REFER TO NUTRITION FOR INFORMATION ON VEGETARIAN DIET. DISCUSSED BENEFITS OF AVOIDING MEAT AND DAIRY.

## 2017-09-12 ENCOUNTER — Other Ambulatory Visit: Payer: Self-pay

## 2017-09-12 DIAGNOSIS — K50812 Crohn's disease of both small and large intestine with intestinal obstruction: Secondary | ICD-10-CM

## 2017-09-12 NOTE — Telephone Encounter (Signed)
Referral sent to nutrition via Epic.

## 2017-09-12 NOTE — Progress Notes (Signed)
cc'd to pcp 

## 2017-09-15 ENCOUNTER — Other Ambulatory Visit: Payer: Self-pay | Admitting: Gastroenterology

## 2017-09-15 ENCOUNTER — Telehealth: Payer: Self-pay

## 2017-09-15 MED ORDER — PREDNISONE 10 MG PO TABS: ORAL_TABLET | ORAL | 0 refills | Status: DC

## 2017-09-15 NOTE — Telephone Encounter (Signed)
PT is aware we have faxed [facility] to get started on the Cimzia. I also called [facility]. Received a VM back that they will be glad to do the first injection for the pt as a nurse visit. But he needs to learn to give it himself or he would be charged a copay each time he came. Dr. Oneida Alar has it ordered for every 2 weeks.  I have called and left Vm for pt to call me tomorrow morning.

## 2017-09-15 NOTE — Telephone Encounter (Signed)
PT is aware.

## 2017-09-15 NOTE — Telephone Encounter (Signed)
PLEASE CALL PT. I SENT RX FOR PREDNISONE WHILE WE ARE WAITING FOR THE CIMZIA TO BE APPROVED.

## 2017-09-16 NOTE — Telephone Encounter (Signed)
REVIEWED-NO ADDITIONAL RECOMMENDATIONS. 

## 2017-09-16 NOTE — Telephone Encounter (Signed)
PT is aware of what [facility] said and he will check with them on the cost of the copay. He will let me know what he plans to do.

## 2017-09-16 NOTE — Telephone Encounter (Signed)
I received a call from [place], pt's girl friend, she said they have spoken to [facility] and pt should not have a copay. His only insurance is going to be his medicaid.

## 2017-09-16 NOTE — Telephone Encounter (Signed)
I received a call from [place] at [facility] and he said he had received a second referral about 3 pm yesterday with a sticky note that said that pt would get injections at  [facility] office. He wanted to know if the medication was to go to the pt or to the Rheumatologist and I told him it should go to the pt. The Rheumatologist office had only told me that they would give first injection and pt should learn to give it . If not pt would be responsible for a copay each time. They said they will ship to pt.

## 2017-09-20 NOTE — Telephone Encounter (Signed)
Received fax from [facility]. More notes needed to get the Cimzia approved. I have faxed more office notes and waiting on reply.

## 2017-09-21 ENCOUNTER — Telehealth: Payer: Self-pay

## 2017-09-21 MED ORDER — ONDANSETRON HCL 4 MG PO TABS: 4.0000 mg | ORAL_TABLET | Freq: Three times a day (TID) | ORAL | 0 refills | Status: DC | PRN

## 2017-09-21 NOTE — Telephone Encounter (Signed)
Fax received from [facility] has been approved from 09/20/2017-06/13/2018.

## 2017-09-21 NOTE — Telephone Encounter (Signed)
PT is aware.

## 2017-09-21 NOTE — Telephone Encounter (Signed)
Pt said he needs refill for Zofran.  Forwarding to refill box.

## 2017-09-22 NOTE — Telephone Encounter (Signed)
Approval to be scanned in to Epic.

## 2017-10-06 ENCOUNTER — Encounter: Payer: Self-pay | Admitting: Gastroenterology

## 2017-10-18 ENCOUNTER — Telehealth: Payer: Self-pay

## 2017-10-18 NOTE — Telephone Encounter (Signed)
Called and left Vm he needs to complete the prednisone and call if questions.

## 2017-10-18 NOTE — Telephone Encounter (Signed)
PLEASE CALL PT. HE SHOULD COMPLETE THE PREDNISONE. CONTINUE CIMZIA.

## 2017-10-18 NOTE — Telephone Encounter (Signed)
PT's significant other called and said pt is starting Cimzia today and has 2-3 more doses of Prednisone. Wants to know if it is OK to take that.

## 2017-11-08 ENCOUNTER — Telehealth: Payer: Self-pay | Admitting: Gastroenterology

## 2017-11-08 DIAGNOSIS — K50812 Crohn's disease of both small and large intestine with intestinal obstruction: Secondary | ICD-10-CM

## 2017-11-08 NOTE — Telephone Encounter (Signed)
[phone] please call patient, he has some questions and concerns

## 2017-11-08 NOTE — Telephone Encounter (Signed)
PT said he thinks he might be having a reaction to the Cimzia. After the 2nd dose, his girlfriend noticed a rash on his chest and back and he said it is also on his legs.  He is also having constant headaches, everyday. Before starting this medication he just had a headache once in a blue moon. He has one more dose before going to the monthly dosing. Dr. Oneida Alar, please advise!

## 2017-11-14 NOTE — Telephone Encounter (Addendum)
PLEASE CALL PT. WE ARE HAVE RUN OUT OF OPTIONS TO MANAGE HIS CROHN'S. HIS ONLY OPTION NOW IS STELARA WHICH THE INITIAL DOSE IS IV THEN HE WILL NEED TO GIVE HIMSELF A SHOT EVERY 8 OR 12 WEEKS. I AM REFERRING HIM TO [facility] TO SEE DR. [place] BLOOMFELD AT [facility](CROHN'S DISEASE SPECIALIST) TO DISCUSS FUTURE MANAGEMENT OF HIS CROHN'S DISEASE IF STELARA FAILS.

## 2017-11-15 NOTE — Telephone Encounter (Addendum)
I have faxed the order to Endo.

## 2017-11-15 NOTE — Telephone Encounter (Signed)
Called [facility] and was advised by scheduler patient is already established with Dr. Nyoka Cowden (crohns specialist) and was last seen there 08/19/16 for follow up. Was advised once established unable to switch physicians.

## 2017-11-15 NOTE — Telephone Encounter (Signed)
REFER TO [facility] TO SEE DR. [place] BLOOMFELD AT [facility](CROHN'S DISEASE, failed multiple BIOLOGIC AGENTS)

## 2017-11-15 NOTE — Addendum Note (Signed)
Addended by: Inge Rise on: 11/15/2017 09:37 AM   Modules accepted: Orders

## 2017-11-15 NOTE — Telephone Encounter (Signed)
Patient scheduled to see Dr. Nyoka Cowden on 02/17/18 at 11:00am, needs to arrive at 10:45am. [address] is the location Patient aware. Letter mailed

## 2017-11-15 NOTE — Telephone Encounter (Signed)
Send pt to DR. GREEN IN 3 MOS, DX: CROHN'S DISEASE.

## 2017-11-21 ENCOUNTER — Telehealth: Payer: Self-pay | Admitting: Gastroenterology

## 2017-11-21 MED ORDER — USTEKINUMAB 90 MG/ML ~~LOC~~ SOSY: 90.0000 mg | PREFILLED_SYRINGE | SUBCUTANEOUS | Status: DC

## 2017-11-21 MED ORDER — USTEKINUMAB 130 MG/26ML IV SOLN: 390.0000 mg | Freq: Once | INTRAVENOUS | 0 refills | Status: AC

## 2017-11-21 NOTE — Telephone Encounter (Addendum)
Called patient TO OBTAIN UP DATE ON SYMPTOMS. LVM-CALL [phone] TO DISCUSS meds and potential side effects.   PAPERWORK FOR STELARA SUBMITTED JUN 11. NEEDS OPV W/ DR. Dorean Hiebert JUL 3@1130 . OPV W/ [facility] IN SEP 2019.

## 2017-11-21 NOTE — Addendum Note (Signed)
Addended by: [name] on: 11/21/2017 07:16 PM   Modules accepted: Orders

## 2017-11-22 ENCOUNTER — Encounter: Payer: Self-pay | Admitting: Gastroenterology

## 2017-11-22 NOTE — Telephone Encounter (Signed)
PATIENT SCHEDULED AND L/M ON VOICEMAIL AND MAILED A LETTER

## 2017-11-24 ENCOUNTER — Telehealth: Payer: Self-pay

## 2017-11-24 NOTE — Telephone Encounter (Signed)
Received the PA approval for the Stelara. It is good through 06/13/2018.   Pt said he was not aware that we were ordering the Stelara. He said he had his last Cimzia injection last week and it tolerating it very well, except a few headaches. He had not been told to stop the Cimzia.  Dr. Oneida Alar, please advise!

## 2017-11-24 NOTE — Telephone Encounter (Signed)
REVIEWED-NO ADDITIONAL RECOMMENDATIONS. 

## 2017-11-24 NOTE — Telephone Encounter (Signed)
Todd called back and said we would need to have another prescription for the Cimzia, since it was automatically cancelled when Moreen Fowler was ordered.  I spoke with Reino Bellis, the pharmacist who has re-instated the prescription for the Cimzia.  She is aware pt's last dose was about a week ago.  She said there should be no interruption in his schedule.

## 2017-11-24 NOTE — Telephone Encounter (Signed)
PLEASE CALL PT. He said CIMZIA WAS CAUSING A RASH BUT IF HE IS TOLERATING IT, HE CAN CONTINUE IT AND WE WILL CANCEL THE STELARA.

## 2017-11-24 NOTE — Telephone Encounter (Addendum)
I spoke with pt and he said he has not had any more rash and wishes to continue Cimzia. I have spoken to [place] at [facility] and cancelled the order for [place].  Pt is aware and will let us know if there are any problems.

## 2017-12-09 DIAGNOSIS — M0760 Enteropathic arthropathies, unspecified site: Secondary | ICD-10-CM | POA: Diagnosis not present

## 2017-12-09 DIAGNOSIS — G44219 Episodic tension-type headache, not intractable: Secondary | ICD-10-CM | POA: Diagnosis not present

## 2017-12-09 DIAGNOSIS — M2550 Pain in unspecified joint: Secondary | ICD-10-CM | POA: Diagnosis not present

## 2017-12-09 DIAGNOSIS — Z681 Body mass index (BMI) 19 or less, adult: Secondary | ICD-10-CM | POA: Diagnosis not present

## 2017-12-09 DIAGNOSIS — Z6820 Body mass index (BMI) 20.0-20.9, adult: Secondary | ICD-10-CM | POA: Diagnosis not present

## 2017-12-09 DIAGNOSIS — K50111 Crohn's disease of large intestine with rectal bleeding: Secondary | ICD-10-CM | POA: Diagnosis not present

## 2017-12-09 DIAGNOSIS — M25471 Effusion, right ankle: Secondary | ICD-10-CM | POA: Diagnosis not present

## 2017-12-09 DIAGNOSIS — Z79899 Other long term (current) drug therapy: Secondary | ICD-10-CM | POA: Diagnosis not present

## 2017-12-14 ENCOUNTER — Encounter: Payer: Self-pay | Admitting: Gastroenterology

## 2017-12-14 ENCOUNTER — Ambulatory Visit: Payer: Medicare Other | Admitting: Gastroenterology

## 2017-12-14 ENCOUNTER — Telehealth: Payer: Self-pay | Admitting: Gastroenterology

## 2017-12-14 NOTE — Telephone Encounter (Signed)
PLEASE CALL PT. HE SHOULD SEE ME WITHIN THE NEXT 2-3 MOS.

## 2017-12-14 NOTE — Telephone Encounter (Signed)
CALLED PATIENT AND LEFT HIM ANOTHER MESSAGE ON HIS PHONE, NO ANSWER

## 2017-12-14 NOTE — Telephone Encounter (Signed)
PATIENT WAS A NO SHOW AND LETTER SENT  °

## 2017-12-20 DIAGNOSIS — I10 Essential (primary) hypertension: Secondary | ICD-10-CM | POA: Diagnosis not present

## 2017-12-20 DIAGNOSIS — D529 Folate deficiency anemia, unspecified: Secondary | ICD-10-CM | POA: Diagnosis not present

## 2017-12-20 DIAGNOSIS — K5000 Crohn's disease of small intestine without complications: Secondary | ICD-10-CM | POA: Diagnosis not present

## 2017-12-20 DIAGNOSIS — D509 Iron deficiency anemia, unspecified: Secondary | ICD-10-CM | POA: Diagnosis not present

## 2017-12-20 DIAGNOSIS — F17210 Nicotine dependence, cigarettes, uncomplicated: Secondary | ICD-10-CM | POA: Diagnosis not present

## 2017-12-20 DIAGNOSIS — D519 Vitamin B12 deficiency anemia, unspecified: Secondary | ICD-10-CM | POA: Diagnosis not present

## 2017-12-24 IMAGING — MR MR LUMBAR SPINE WO/W CM
4 of 7 series · 11 of 48 positions shown · IV contrast (10ml Multihance)
Comparison: 07/12/2016 CT abdomen pelvis.

CLINICAL DATA: 46-year-old male with bilateral leg pain and
cramping. Crohn's disease. Initial encounter.

EXAM:
MRI LUMBAR SPINE WITHOUT AND WITH CONTRAST
TECHNIQUE: Multiplanar and multiecho pulse sequences of the lumbar spine were
obtained without and with intravenous contrast.
CONTRAST:  10mL MULTIHANCE GADOBENATE DIMEGLUMINE 529 MG/ML IV SOLN

[Series 3: T1 · sagittal · 4.0mm · 0.39mm/px · 2 of 15 slices shown]
[im 1/15]
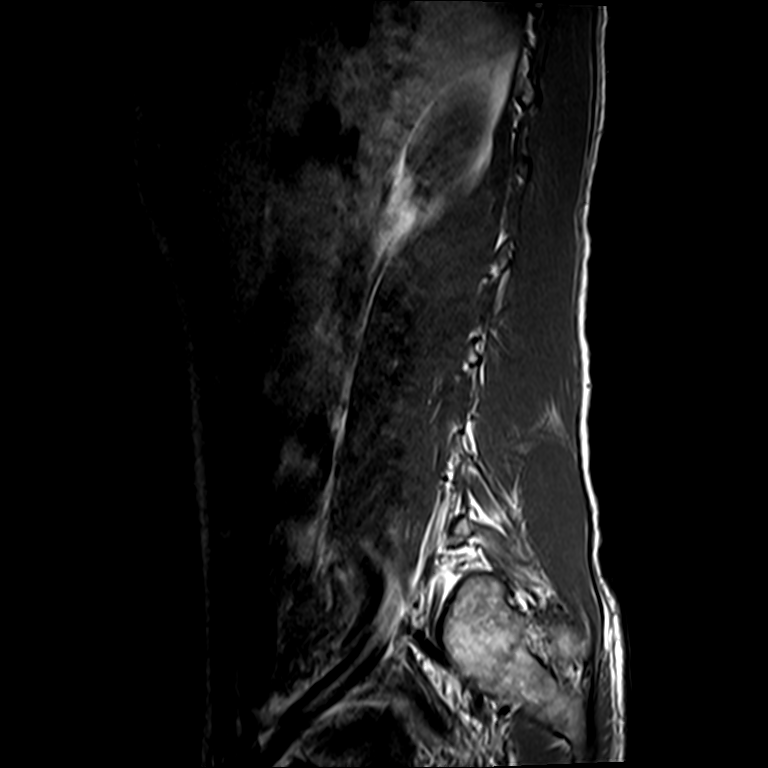
[im 8/15]
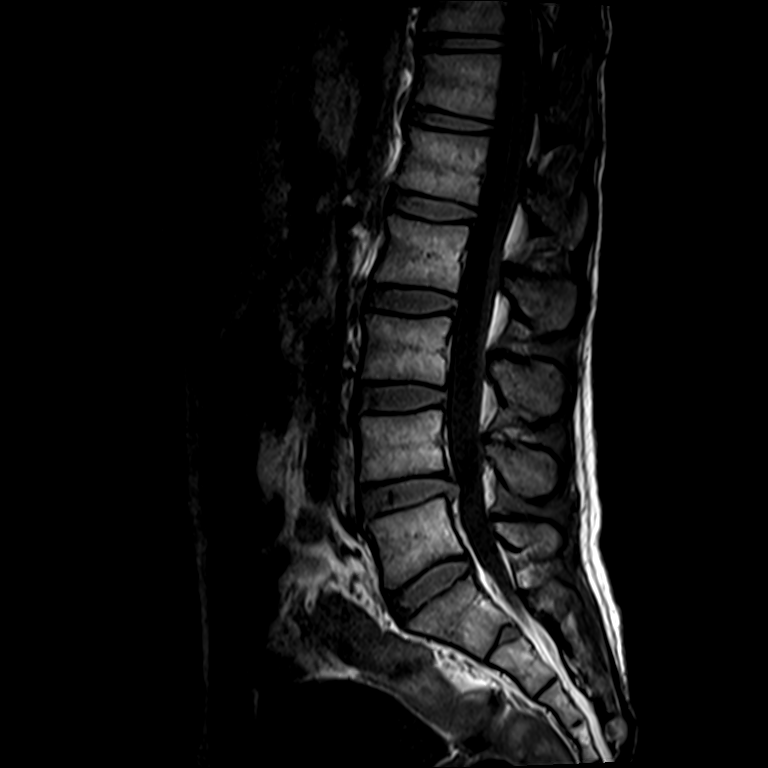

[Series 4: T2 · sagittal · 4.0mm · 0.44mm/px · 3 of 20 slices shown (1 of 3)]
[im 4/20]
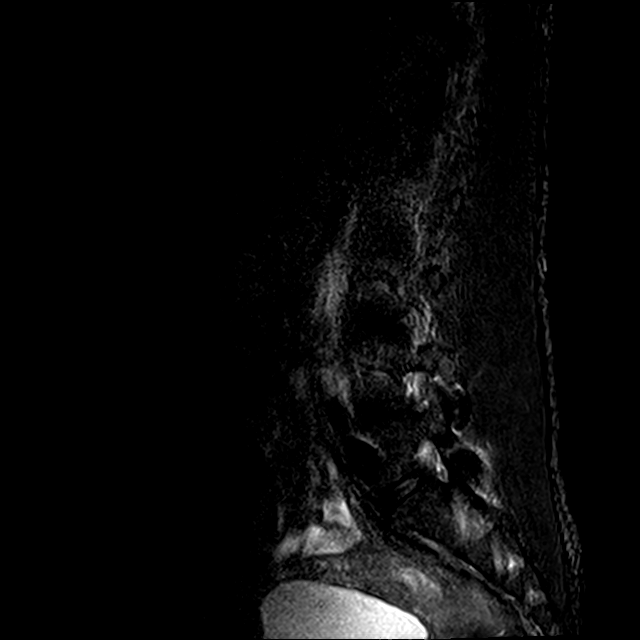
[im 12/20]
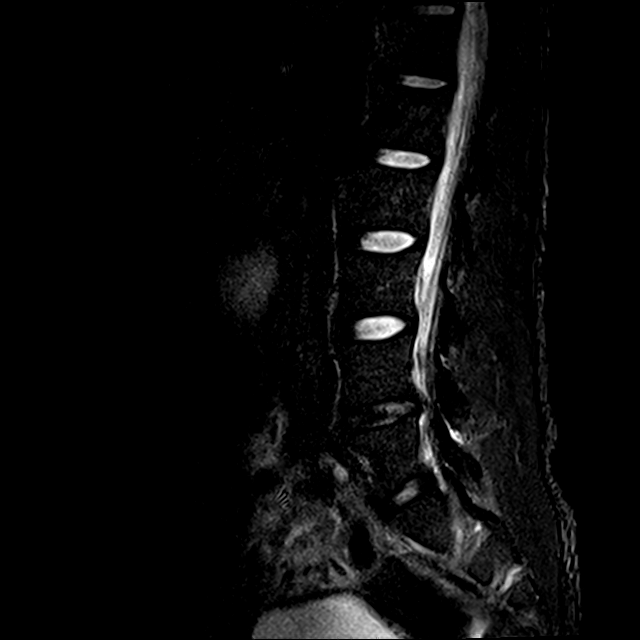
[im 20/20]
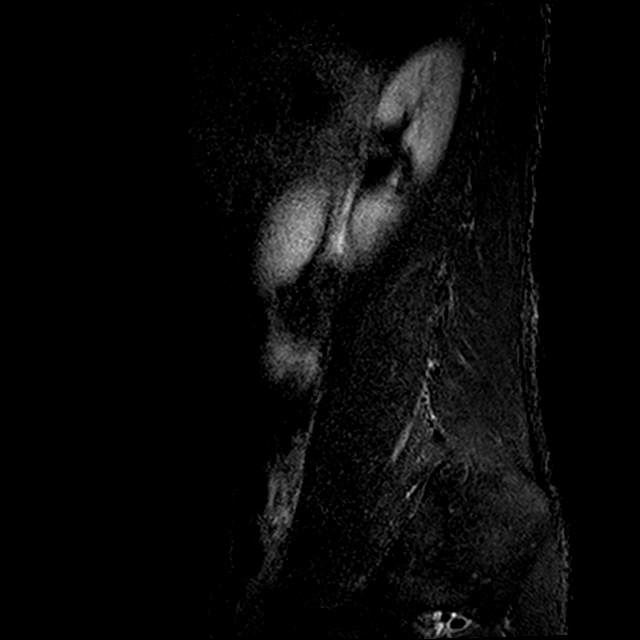

[Series 5: T2 · axial · 4.0mm · 0.20mm/px · z∈[-72,+72]mm · 3 of 39 slices shown (2 of 3)]
[im 5/39]
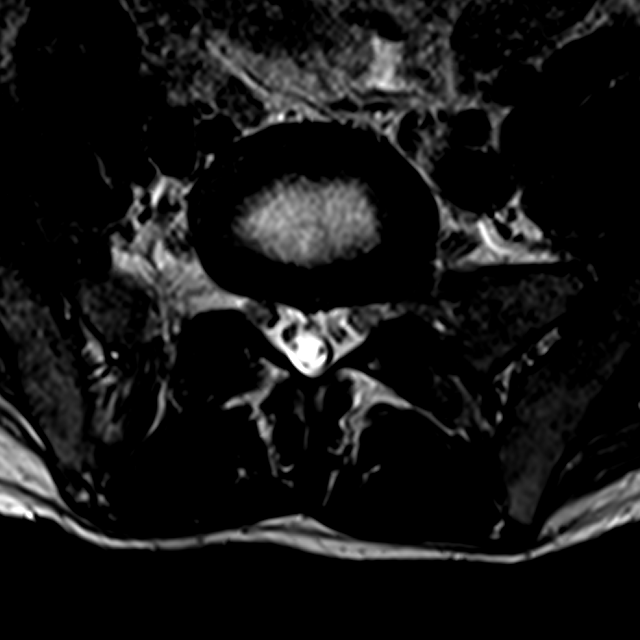
[im 22/39]
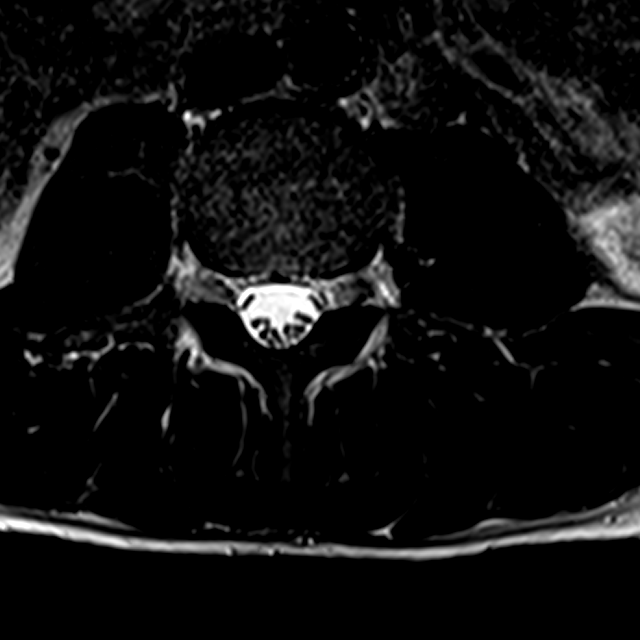
[im 34/39]
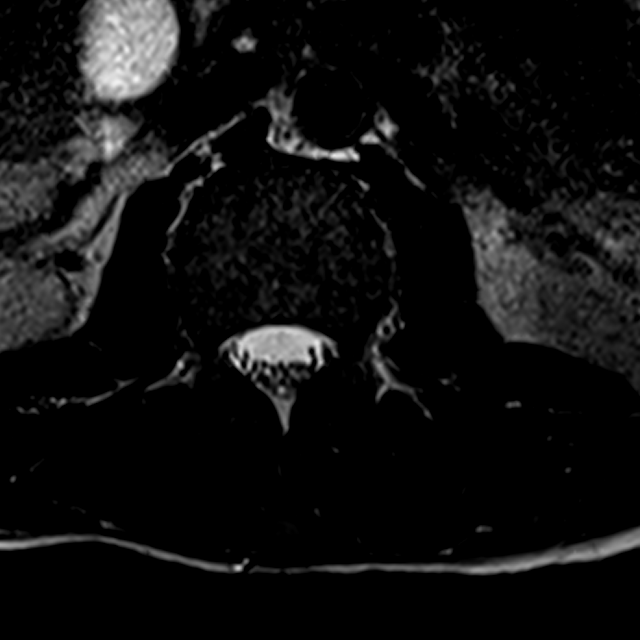

[Series 7: T2 · sagittal · 4.0mm · 0.44mm/px · 3 of 13 slices shown (3 of 3)]
[im 1/13]
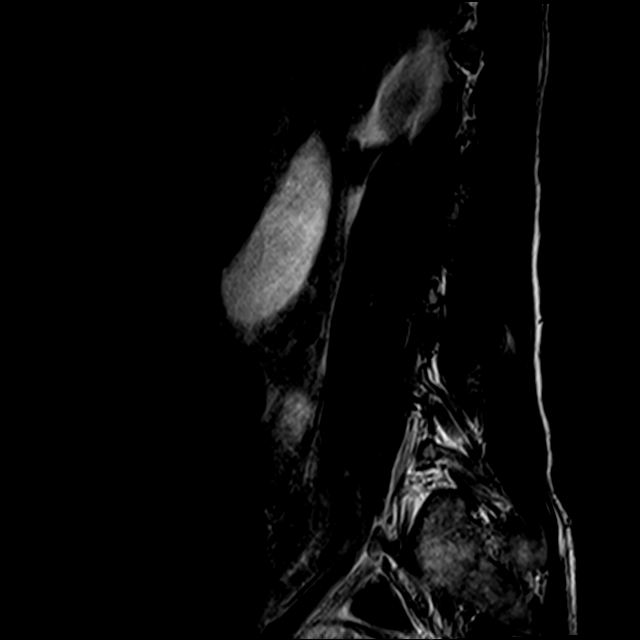
[im 7/13]
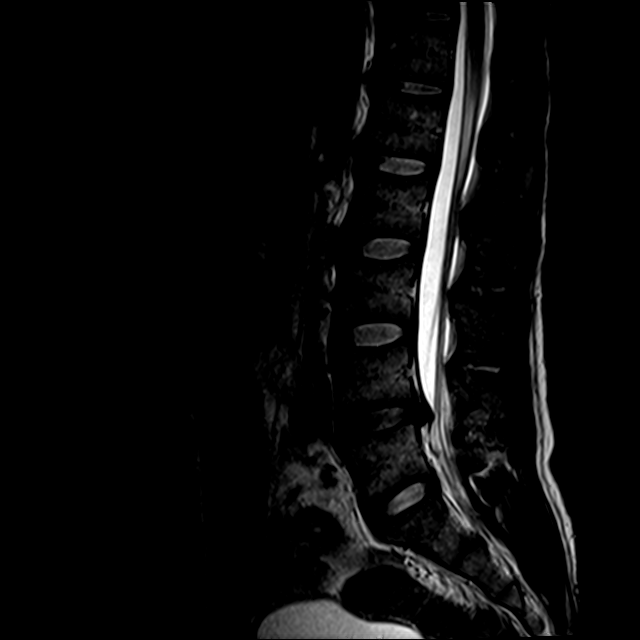
[im 13/13]
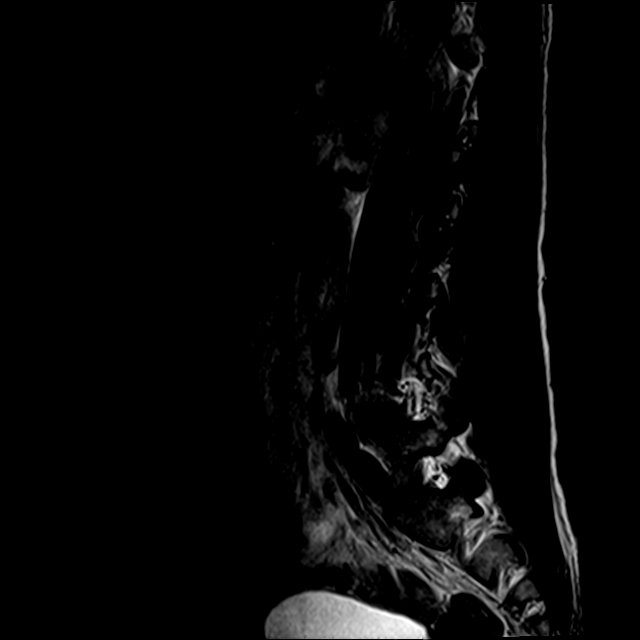

[11 of 48 positions shown; findings below may reference images not displayed]

FINDINGS: Segmentation: Last fully open disk space is labeled L5-S1. Present
examination incorporates from T11-12 disc space through the lower
sacrum.

Alignment:  Minimal curvature.

Vertebrae: L4-5 facet degenerative changes with tiny amount of fluid
in the joint space. Mild edematous appearance of adjacent paraspinal
musculature without enhancement to suggest infection.

Conus medullaris: Extends to the upper L1 level and appears normal.

Paraspinal and other soft tissues: As above.

Disc levels:

T11-12 through L3-4 unremarkable.

L4-5: Mild disc degeneration and disc space narrowing with baseline
bulge with superimposed right posterior lateral protrusion with
minimal cephalad and mild caudal extension with impression upon the
right L5 nerve root and right lateral aspect of the thecal sac.

L5-S1:  Mild facet degenerative changes.
IMPRESSION: L4-5 right posterior lateral protrusion with mild mass effect upon
the right L5 nerve root and narrowing of the right lateral thecal
sac. L4-5 moderate facet degenerative changes. The lack of
enhancement surrounding the L4-5 facet joints suggests that findings
are related to degenerative changes rather than infection.

## 2017-12-27 DIAGNOSIS — Z681 Body mass index (BMI) 19 or less, adult: Secondary | ICD-10-CM | POA: Diagnosis not present

## 2017-12-27 DIAGNOSIS — I10 Essential (primary) hypertension: Secondary | ICD-10-CM | POA: Diagnosis not present

## 2017-12-27 DIAGNOSIS — Z0001 Encounter for general adult medical examination with abnormal findings: Secondary | ICD-10-CM | POA: Diagnosis not present

## 2017-12-27 DIAGNOSIS — F17210 Nicotine dependence, cigarettes, uncomplicated: Secondary | ICD-10-CM | POA: Diagnosis not present

## 2017-12-27 DIAGNOSIS — K5000 Crohn's disease of small intestine without complications: Secondary | ICD-10-CM | POA: Diagnosis not present

## 2018-01-17 DIAGNOSIS — M0760 Enteropathic arthropathies, unspecified site: Secondary | ICD-10-CM | POA: Diagnosis not present

## 2018-01-17 DIAGNOSIS — Z6820 Body mass index (BMI) 20.0-20.9, adult: Secondary | ICD-10-CM | POA: Diagnosis not present

## 2018-01-17 DIAGNOSIS — K50111 Crohn's disease of large intestine with rectal bleeding: Secondary | ICD-10-CM | POA: Diagnosis not present

## 2018-01-17 DIAGNOSIS — M2550 Pain in unspecified joint: Secondary | ICD-10-CM | POA: Diagnosis not present

## 2018-01-17 DIAGNOSIS — G44219 Episodic tension-type headache, not intractable: Secondary | ICD-10-CM | POA: Diagnosis not present

## 2018-01-17 DIAGNOSIS — M25471 Effusion, right ankle: Secondary | ICD-10-CM | POA: Diagnosis not present

## 2018-01-26 ENCOUNTER — Encounter

## 2018-01-26 ENCOUNTER — Ambulatory Visit: Payer: BLUE CROSS/BLUE SHIELD | Admitting: Gastroenterology

## 2018-03-15 ENCOUNTER — Encounter: Payer: Self-pay | Admitting: Gastroenterology

## 2018-03-15 ENCOUNTER — Ambulatory Visit (INDEPENDENT_AMBULATORY_CARE_PROVIDER_SITE_OTHER): Payer: Medicare Other | Admitting: Gastroenterology

## 2018-03-15 DIAGNOSIS — Z9189 Other specified personal risk factors, not elsewhere classified: Secondary | ICD-10-CM | POA: Diagnosis not present

## 2018-03-15 DIAGNOSIS — K50812 Crohn's disease of both small and large intestine with intestinal obstruction: Secondary | ICD-10-CM

## 2018-03-15 NOTE — Assessment & Plan Note (Addendum)
  SYMPTOMS FAIRLY WELL CONTROLLED.   DRINK WATER TO KEEP YOUR URINE LIGHT YELLOW. DRINK MUSCLE MILK PROTEIN SHAKE,  BOOST, ENSURE, OR CARNATION INSTANT BREAKFAST THREE TIMES A DAY. CONTINUE STELARA. CHECK LABS: B12 VIT D/CBC/CMP FOLLOW UP IN 4 MOS.

## 2018-03-15 NOTE — Assessment & Plan Note (Signed)
LAST VIT D 2018 35. PT HAS IBD.  REPEAT VITAMIN D LEVEL. DEXA SCAN UP TO DATE.

## 2018-03-15 NOTE — Patient Instructions (Signed)
DRINK WATER TO KEEP YOUR URINE LIGHT YELLOW.  DRINK MUSCLE MILK PROTEIN SHAKE,  BOOST, ENSURE, OR CARNATION INSTANT BREAKFAST THREE TIMES A DAY.  CONTINUE STELARA.  COMPLETE YOUR LABS.  FOLLOW UP IN 4 MOS.

## 2018-03-15 NOTE — Progress Notes (Signed)
Subjective:    Patient ID: Joshua Robertson, male    DOB: 04-22-1976, 42 y.o.   MRN: [id number]  Caryl Bis, MD  HPI BOWELS ARE ALL OVER THE PLACE. GETTING STELARA AT RHEUMATOLOGIST. SAW DERMATOLOGIST AND FOOT RASH IS BETTER. ARM RASH ON LEFT(ITCHING). BMs: DEPENDING ON WHAT HE EATS: 2-3X/DAY(#1, 3, 4, 7). TODAY 5-7 MIX(TACO BELL). COUPLE DAYS OF NAUSEA. VOMITING: 3X(NO BLOOD). THC: DAILY, MULTIPLE TIMES A DAY. PAIN IN R LEG(MOVES UP AND DOWN LATERAL LEG). NO SORES IN MOUTH, RASH ON LEGS, OR BACK PAIN.   PT DENIES FEVER, CHILLS, HEMATOCHEZIA, HEMATEMESIS, melena, CHEST PAIN, SHORTNESS OF BREATH, CHANGE IN BOWEL IN HABITS, abdominal pain, problems swallowing, OR heartburn or indigestion.  Past Medical History:  Diagnosis Date  . BMI between 19-24,adult JAN 2010 149 LBS  . Crohn's disease ([place]) AUG 2009 NUR [facility]   HB 9.4 FERRITIN 26  140 LBS; no serologies-pt can't afford test  . Helicobacter pylori gastritis 2009  . Hypertension   . Migraine headache    NONE SINCE AGE 1  . Sacroiliitis ([place]) 2009  . Sickle cell trait ([place])   . Spondyloarthropathy ([place]) 2009 EROSIVE   Past Surgical History:  Procedure Laterality Date  . BACK SURGERY     2018  outpatient x2  . BIOPSY  09/06/2017     . COLON RESECTION     due to chrohn's  . COLONOSCOPY  03/2015   [facility]: Crohn's like stricture at ICV. Stricture was not traversable. abnormal mucosa of ICV. Bx, c/w Crohn's.   . COLONOSCOPY N/A 07/30/2013   stenotic IC VALVE. UNABLE TO INTUBATE ILEUM.  [facility] COLONOSCOPY WITH PROPOFOL N/A 09/06/2017     . HEMORRHOID SURGERY N/A 08/01/2017     . LUMBAR LAMINECTOMY/DECOMPRESSION MICRODISCECTOMY Right 02/04/2017      Allergies  Allergen Reactions  . Penicillins Swelling and Rash      . Azathioprine Nausea And Vomiting  . Iodinated Diagnostic Agents Nausea And Vomiting  . Metrizamide Nausea And Vomiting  . Pentasa [Mesalamine Er] Rash   Current Outpatient Medications  Medication Sig     . cholecalciferol (VITAMIN D) 1000 units tablet Take 1 tablet (1,000 Units total) by mouth daily. BEGIN AFTER WEEKLY VIT D DOSING IS COMPLETE    . cloNIDine (CATAPRES) 0.1 MG tablet Take 1 tablet (0.1 mg total) by mouth 2 (two) times daily.    . Multiple Vitamin (MULTIVITAMIN WITH MINERALS) TABS tablet Take 1 tablet by mouth daily. Doesn't take everyday    . ustekinumab (STELARA) 90 MG/ML SOSY injection Inject 1 mL (90 mg total) into the skin every 8 (eight) weeks.    .      . cyclobenzaprine (FLEXERIL) 10 MG tablet Take 10 mg by mouth as needed for muscle spasms.    .      .      .       Review of Systems PER HPI OTHERWISE ALL SYSTEMS ARE NEGATIVE.    Objective:   Physical Exam  Constitutional: He is oriented to person, place, and time. He appears well-developed and well-nourished. No distress.  HENT:  Head: Normocephalic and atraumatic.  Mouth/Throat: Oropharynx is clear and moist. No oropharyngeal exudate.  Eyes: Pupils are equal, round, and reactive to light. No scleral icterus.  Neck: Normal range of motion. Neck supple.  Cardiovascular: Normal rate, regular rhythm and normal heart sounds.  Pulmonary/Chest: Effort normal and breath sounds normal. No respiratory distress.  Abdominal: Soft. Bowel sounds are normal.  He exhibits no distension. There is no tenderness.  Musculoskeletal: He exhibits no edema.  Lymphadenopathy:    He has no cervical adenopathy.  Neurological: He is alert and oriented to person, place, and time.  Psychiatric: He has a normal mood and affect.  Vitals reviewed.     Assessment & Plan:

## 2018-03-29 DIAGNOSIS — K50812 Crohn's disease of both small and large intestine with intestinal obstruction: Secondary | ICD-10-CM | POA: Diagnosis not present

## 2018-03-30 ENCOUNTER — Other Ambulatory Visit: Payer: Self-pay

## 2018-03-30 DIAGNOSIS — K50919 Crohn's disease, unspecified, with unspecified complications: Secondary | ICD-10-CM

## 2018-03-30 LAB — CBC WITH DIFFERENTIAL/PLATELET
BASOS ABS: 90 {cells}/uL (ref 0–200)
Basophils Relative: 1 %
EOS PCT: 1.9 %
Eosinophils Absolute: 171 cells/uL (ref 15–500)
HCT: 37.7 % — ABNORMAL LOW (ref 38.5–50.0)
HEMOGLOBIN: 11.9 g/dL — AB (ref 13.2–17.1)
Lymphs Abs: 3141 cells/uL (ref 850–3900)
MCH: 23.8 pg — ABNORMAL LOW (ref 27.0–33.0)
MCHC: 31.6 g/dL — AB (ref 32.0–36.0)
MCV: 75.4 fL — ABNORMAL LOW (ref 80.0–100.0)
MONOS PCT: 4.9 %
Neutro Abs: 5157 cells/uL (ref 1500–7800)
Neutrophils Relative %: 57.3 %
Platelets: 262 10*3/uL (ref 140–400)
RBC: 5 10*6/uL (ref 4.20–5.80)
RDW: 18.8 % — ABNORMAL HIGH (ref 11.0–15.0)
Total Lymphocyte: 34.9 %
WBC mixed population: 441 cells/uL (ref 200–950)
WBC: 9 10*3/uL (ref 3.8–10.8)

## 2018-03-30 LAB — COMPLETE METABOLIC PANEL WITH GFR
AG RATIO: 1.6 (calc) (ref 1.0–2.5)
ALT: 8 U/L — ABNORMAL LOW (ref 9–46)
AST: 16 U/L (ref 10–40)
Albumin: 4.5 g/dL (ref 3.6–5.1)
Alkaline phosphatase (APISO): 72 U/L (ref 40–115)
BILIRUBIN TOTAL: 0.4 mg/dL (ref 0.2–1.2)
BUN: 8 mg/dL (ref 7–25)
CHLORIDE: 103 mmol/L (ref 98–110)
CO2: 27 mmol/L (ref 20–32)
Calcium: 9.6 mg/dL (ref 8.6–10.3)
Creat: 0.96 mg/dL (ref 0.60–1.35)
GFR, EST AFRICAN AMERICAN: 113 mL/min/{1.73_m2} (ref >=60)
GFR, Est Non African American: 98 mL/min/{1.73_m2} (ref >=60)
Globulin: 2.8 g/dL (calc) (ref 1.9–3.7)
Glucose, Bld: 66 mg/dL (ref 65–99)
POTASSIUM: 4.3 mmol/L (ref 3.5–5.3)
Sodium: 139 mmol/L (ref 135–146)
Total Protein: 7.3 g/dL (ref 6.1–8.1)

## 2018-03-30 LAB — VITAMIN B12: Vitamin B-12: 340 pg/mL (ref 200–1100)

## 2018-03-30 NOTE — Progress Notes (Signed)
CC'D TO PCP °

## 2018-03-30 NOTE — Progress Notes (Signed)
PT is aware of results and lab orders on file for 09/2018.

## 2018-04-08 DIAGNOSIS — H5712 Ocular pain, left eye: Secondary | ICD-10-CM | POA: Diagnosis not present

## 2018-04-08 DIAGNOSIS — S0502XA Injury of conjunctiva and corneal abrasion without foreign body, left eye, initial encounter: Secondary | ICD-10-CM | POA: Diagnosis not present

## 2018-04-08 DIAGNOSIS — Z6820 Body mass index (BMI) 20.0-20.9, adult: Secondary | ICD-10-CM | POA: Diagnosis not present

## 2018-04-09 DIAGNOSIS — D3112 Benign neoplasm of left cornea: Secondary | ICD-10-CM | POA: Diagnosis not present

## 2018-04-11 DIAGNOSIS — B309 Viral conjunctivitis, unspecified: Secondary | ICD-10-CM | POA: Diagnosis not present

## 2018-04-18 DIAGNOSIS — K5090 Crohn's disease, unspecified, without complications: Secondary | ICD-10-CM | POA: Diagnosis not present

## 2018-04-18 DIAGNOSIS — H538 Other visual disturbances: Secondary | ICD-10-CM | POA: Diagnosis not present

## 2018-04-24 DIAGNOSIS — K50111 Crohn's disease of large intestine with rectal bleeding: Secondary | ICD-10-CM | POA: Diagnosis not present

## 2018-04-24 DIAGNOSIS — Z23 Encounter for immunization: Secondary | ICD-10-CM | POA: Diagnosis not present

## 2018-04-24 DIAGNOSIS — Z6820 Body mass index (BMI) 20.0-20.9, adult: Secondary | ICD-10-CM | POA: Diagnosis not present

## 2018-04-24 DIAGNOSIS — Z79899 Other long term (current) drug therapy: Secondary | ICD-10-CM | POA: Diagnosis not present

## 2018-04-24 DIAGNOSIS — M25471 Effusion, right ankle: Secondary | ICD-10-CM | POA: Diagnosis not present

## 2018-04-24 DIAGNOSIS — M0760 Enteropathic arthropathies, unspecified site: Secondary | ICD-10-CM | POA: Diagnosis not present

## 2018-04-24 DIAGNOSIS — G44219 Episodic tension-type headache, not intractable: Secondary | ICD-10-CM | POA: Diagnosis not present

## 2018-04-24 DIAGNOSIS — M2550 Pain in unspecified joint: Secondary | ICD-10-CM | POA: Diagnosis not present

## 2018-05-24 DIAGNOSIS — K50111 Crohn's disease of large intestine with rectal bleeding: Secondary | ICD-10-CM | POA: Diagnosis not present

## 2018-06-19 ENCOUNTER — Telehealth: Payer: Self-pay

## 2018-06-19 NOTE — Telephone Encounter (Signed)
Pt called and would like to come in to see Dr. Nona Dell and discuss his Cimzia. The place that was giving him the injections said they will not see him anymore.  He missed an appt in Oct and in Jan, so they are refusing to keep seeing him. He is aware that Dr. Oneida Alar is away for a week. He said he is OK until Feb 2., just let her know when she returns.

## 2018-06-22 NOTE — Telephone Encounter (Signed)
PLEASE CALL PT. IF HE MISSED HIS APPTS HIS ONLY OPTION FOR CIMZIA IS TO GIVE HIMSELF THE INJECTION.

## 2018-06-23 NOTE — Telephone Encounter (Signed)
PT is aware, said his mom gave him the last dose of Cimzia, but that is not gong to work. He said it will not work with him giving the shot. He said he would have to come up with something else.

## 2018-06-26 ENCOUNTER — Telehealth: Payer: Self-pay

## 2018-06-26 NOTE — Telephone Encounter (Signed)
Received an approval for Cimzia and it is good until 06/14/2019.  Letter to be scanned.

## 2018-06-26 NOTE — Telephone Encounter (Signed)
PT called and said he has got it worked out with the office that was giving him his injections, they will be able to continue doing so.

## 2018-06-27 NOTE — Telephone Encounter (Signed)
REVIEWED-NO ADDITIONAL RECOMMENDATIONS. 

## 2018-07-17 DIAGNOSIS — M0760 Enteropathic arthropathies, unspecified site: Secondary | ICD-10-CM | POA: Diagnosis not present

## 2018-07-17 DIAGNOSIS — K50111 Crohn's disease of large intestine with rectal bleeding: Secondary | ICD-10-CM | POA: Diagnosis not present

## 2018-07-17 DIAGNOSIS — G44219 Episodic tension-type headache, not intractable: Secondary | ICD-10-CM | POA: Diagnosis not present

## 2018-07-17 DIAGNOSIS — M2550 Pain in unspecified joint: Secondary | ICD-10-CM | POA: Diagnosis not present

## 2018-07-17 DIAGNOSIS — Z79899 Other long term (current) drug therapy: Secondary | ICD-10-CM | POA: Diagnosis not present

## 2018-07-17 DIAGNOSIS — Z6821 Body mass index (BMI) 21.0-21.9, adult: Secondary | ICD-10-CM | POA: Diagnosis not present

## 2018-07-17 DIAGNOSIS — M25471 Effusion, right ankle: Secondary | ICD-10-CM | POA: Diagnosis not present

## 2018-08-14 DIAGNOSIS — K50111 Crohn's disease of large intestine with rectal bleeding: Secondary | ICD-10-CM | POA: Diagnosis not present

## 2018-08-29 ENCOUNTER — Other Ambulatory Visit: Payer: Self-pay

## 2018-08-29 DIAGNOSIS — K50919 Crohn's disease, unspecified, with unspecified complications: Secondary | ICD-10-CM

## 2018-09-03 ENCOUNTER — Telehealth: Payer: Self-pay | Admitting: Gastroenterology

## 2018-09-03 DIAGNOSIS — K50118 Crohn's disease of large intestine with other complication: Secondary | ICD-10-CM

## 2018-09-03 NOTE — Telephone Encounter (Signed)
PLEASE CALL PT. HE NEEDS TO HAVE CMP/CBC DRAWN. HE NEEDS THE HEPATITIS A/B VACCINE IF HE HAS NOT RECEIVED IT.

## 2018-09-04 ENCOUNTER — Other Ambulatory Visit: Payer: Self-pay

## 2018-09-04 DIAGNOSIS — K50919 Crohn's disease, unspecified, with unspecified complications: Secondary | ICD-10-CM

## 2018-09-04 NOTE — Telephone Encounter (Signed)
REVIEWED. ADD HEPATITS B sAB AND TOTAL HEP A Ab WITH NEXT BLOOD DRAW.

## 2018-09-04 NOTE — Telephone Encounter (Signed)
Pt is aware and lab orders faxed to [place].

## 2018-09-04 NOTE — Telephone Encounter (Signed)
Lab orders have been entered and released. Pt is aware to go to [facility]. He said he completed the vaccinations prior to starting Cimzia. He had brought copies here, but if he needs to bring them again, he said he can do so.

## 2018-09-04 NOTE — Addendum Note (Signed)
Addended by: [name] on: 09/04/2018 11:37 AM   Modules accepted: Orders

## 2018-09-05 DIAGNOSIS — K50919 Crohn's disease, unspecified, with unspecified complications: Secondary | ICD-10-CM | POA: Diagnosis not present

## 2018-09-05 DIAGNOSIS — K50118 Crohn's disease of large intestine with other complication: Secondary | ICD-10-CM | POA: Diagnosis not present

## 2018-09-06 LAB — COMPREHENSIVE METABOLIC PANEL
AG Ratio: 1.5 (calc) (ref 1.0–2.5)
ALKALINE PHOSPHATASE (APISO): 77 U/L (ref 36–130)
ALT: 7 U/L — AB (ref 9–46)
AST: 15 U/L (ref 10–40)
Albumin: 4.3 g/dL (ref 3.6–5.1)
BILIRUBIN TOTAL: 0.4 mg/dL (ref 0.2–1.2)
BUN: 9 mg/dL (ref 7–25)
CALCIUM: 9.5 mg/dL (ref 8.6–10.3)
CO2: 31 mmol/L (ref 20–32)
CREATININE: 0.98 mg/dL (ref 0.60–1.35)
Chloride: 105 mmol/L (ref 98–110)
Globulin: 2.8 g/dL (calc) (ref 1.9–3.7)
Glucose, Bld: 70 mg/dL (ref 65–139)
Potassium: 4.3 mmol/L (ref 3.5–5.3)
Sodium: 140 mmol/L (ref 135–146)
Total Protein: 7.1 g/dL (ref 6.1–8.1)

## 2018-09-06 LAB — CBC WITH DIFFERENTIAL/PLATELET
Absolute Monocytes: 374 cells/uL (ref 200–950)
BASOS PCT: 0.9 %
Basophils Absolute: 78 cells/uL (ref 0–200)
EOS PCT: 2.9 %
Eosinophils Absolute: 252 cells/uL (ref 15–500)
HCT: 38.4 % — ABNORMAL LOW (ref 38.5–50.0)
Hemoglobin: 12.2 g/dL — ABNORMAL LOW (ref 13.2–17.1)
Lymphs Abs: 3558 cells/uL (ref 850–3900)
MCH: 23.9 pg — ABNORMAL LOW (ref 27.0–33.0)
MCHC: 31.8 g/dL — AB (ref 32.0–36.0)
MCV: 75.1 fL — ABNORMAL LOW (ref 80.0–100.0)
MPV: 11.8 fL (ref 7.5–12.5)
Monocytes Relative: 4.3 %
Neutro Abs: 4437 cells/uL (ref 1500–7800)
Neutrophils Relative %: 51 %
Platelets: 315 10*3/uL (ref 140–400)
RBC: 5.11 10*6/uL (ref 4.20–5.80)
RDW: 17.3 % — ABNORMAL HIGH (ref 11.0–15.0)
Total Lymphocyte: 40.9 %
WBC: 8.7 10*3/uL (ref 3.8–10.8)

## 2018-09-06 LAB — HEPATITIS B SURFACE ANTIBODY,QUALITATIVE: HEP B S AB: REACTIVE — AB

## 2018-09-06 LAB — HEPATITIS A ANTIBODY, TOTAL: HEPATITIS A AB,TOTAL: NONREACTIVE

## 2018-09-06 NOTE — Telephone Encounter (Signed)
PT is aware.

## 2018-09-06 NOTE — Telephone Encounter (Signed)
PT is aware of all results. He goes to [facility] and not sure if they do the vaccinations.  I am mailing him an order for the Hep A vaccine that he can have done at [facility] or [facility] if PCP does not do.

## 2018-09-07 NOTE — Telephone Encounter (Signed)
CC'D TO PCP °

## 2018-09-08 DIAGNOSIS — Z23 Encounter for immunization: Secondary | ICD-10-CM | POA: Diagnosis not present

## 2018-09-11 ENCOUNTER — Telehealth: Payer: Self-pay

## 2018-09-11 ENCOUNTER — Other Ambulatory Visit: Payer: Self-pay | Admitting: Gastroenterology

## 2018-09-11 DIAGNOSIS — K50111 Crohn's disease of large intestine with rectal bleeding: Secondary | ICD-10-CM | POA: Diagnosis not present

## 2018-09-11 DIAGNOSIS — Z719 Counseling, unspecified: Secondary | ICD-10-CM | POA: Diagnosis not present

## 2018-09-11 NOTE — Telephone Encounter (Signed)
T/C from [place] with [facility] said that pt is taking the Cimzia once a month and not twice a month.  Said he told her that we were giving it here at the office. I told her no, he goes to another office.  I called pt and he said he thought he was only supposed to take once a month, and he realized they were sending it more often, but he thought it was just to keep him from running out. Pt said he has been taking like this since about Oct 2019.  Lisa's call back number is [phone] opt 1.  Forwarding to Dr. Oneida Alar to advise when she returns.

## 2018-09-12 NOTE — Telephone Encounter (Signed)
PLEASE CALL PT and [facility]. PT NEEDS CIMZIA ONCE A MONTH.

## 2018-09-12 NOTE — Telephone Encounter (Signed)
I called [facility] and informed Freda Munro and called and made pt aware also.

## 2018-09-19 DIAGNOSIS — Z23 Encounter for immunization: Secondary | ICD-10-CM | POA: Diagnosis not present

## 2018-10-03 ENCOUNTER — Telehealth: Payer: Self-pay

## 2018-10-03 NOTE — Telephone Encounter (Signed)
Received a call from Nate ([facility]).they are working on a PA for pt's medication. They need a copy of pts last ov notes faxed to [phone].  Ov noted faxed as requested.

## 2018-10-04 NOTE — Telephone Encounter (Signed)
Received a cancellation request for Cimzia PA. Called [facility] at [phone] and was asked to disregard the message sent. PA for Cimzia was approved 09/2018- 06/14/2019.

## 2018-10-06 ENCOUNTER — Other Ambulatory Visit: Payer: Self-pay

## 2018-10-06 NOTE — Telephone Encounter (Signed)
Do you still have the fax that I can fill out?

## 2018-10-06 NOTE — Telephone Encounter (Signed)
Received refill request fax from [facility] (fax [phone]) for Cimzia 2 x 200 mg PFS kit PFS. Give one 250m/ml doses (200 mg total) as subcutaneous injection every two weeks. Use as directed by prescriber.

## 2018-10-09 DIAGNOSIS — K50111 Crohn's disease of large intestine with rectal bleeding: Secondary | ICD-10-CM | POA: Diagnosis not present

## 2018-10-09 DIAGNOSIS — Z79899 Other long term (current) drug therapy: Secondary | ICD-10-CM | POA: Diagnosis not present

## 2018-10-09 NOTE — Telephone Encounter (Signed)
EG completed refill request, faxed to [facility].

## 2018-10-12 DIAGNOSIS — M0760 Enteropathic arthropathies, unspecified site: Secondary | ICD-10-CM | POA: Diagnosis not present

## 2018-10-12 DIAGNOSIS — K50111 Crohn's disease of large intestine with rectal bleeding: Secondary | ICD-10-CM | POA: Diagnosis not present

## 2018-10-12 DIAGNOSIS — Z7189 Other specified counseling: Secondary | ICD-10-CM | POA: Diagnosis not present

## 2018-10-12 DIAGNOSIS — M25471 Effusion, right ankle: Secondary | ICD-10-CM | POA: Diagnosis not present

## 2018-10-12 DIAGNOSIS — M25561 Pain in right knee: Secondary | ICD-10-CM | POA: Diagnosis not present

## 2018-10-12 DIAGNOSIS — M2550 Pain in unspecified joint: Secondary | ICD-10-CM | POA: Diagnosis not present

## 2018-10-12 DIAGNOSIS — Z79899 Other long term (current) drug therapy: Secondary | ICD-10-CM | POA: Diagnosis not present

## 2018-10-12 DIAGNOSIS — G44219 Episodic tension-type headache, not intractable: Secondary | ICD-10-CM | POA: Diagnosis not present

## 2018-11-07 DIAGNOSIS — K50111 Crohn's disease of large intestine with rectal bleeding: Secondary | ICD-10-CM | POA: Diagnosis not present

## 2018-12-05 DIAGNOSIS — K50111 Crohn's disease of large intestine with rectal bleeding: Secondary | ICD-10-CM | POA: Diagnosis not present

## 2018-12-21 DIAGNOSIS — Z0001 Encounter for general adult medical examination with abnormal findings: Secondary | ICD-10-CM | POA: Diagnosis not present

## 2018-12-21 DIAGNOSIS — I10 Essential (primary) hypertension: Secondary | ICD-10-CM | POA: Diagnosis not present

## 2018-12-21 DIAGNOSIS — K5000 Crohn's disease of small intestine without complications: Secondary | ICD-10-CM | POA: Diagnosis not present

## 2019-01-02 DIAGNOSIS — K50111 Crohn's disease of large intestine with rectal bleeding: Secondary | ICD-10-CM | POA: Diagnosis not present

## 2019-02-09 DIAGNOSIS — K50111 Crohn's disease of large intestine with rectal bleeding: Secondary | ICD-10-CM | POA: Diagnosis not present

## 2019-02-09 DIAGNOSIS — M0760 Enteropathic arthropathies, unspecified site: Secondary | ICD-10-CM | POA: Diagnosis not present

## 2019-02-09 DIAGNOSIS — D509 Iron deficiency anemia, unspecified: Secondary | ICD-10-CM | POA: Diagnosis not present

## 2019-02-09 DIAGNOSIS — Z79899 Other long term (current) drug therapy: Secondary | ICD-10-CM | POA: Diagnosis not present

## 2019-02-09 DIAGNOSIS — M25561 Pain in right knee: Secondary | ICD-10-CM | POA: Diagnosis not present

## 2019-02-09 DIAGNOSIS — M25471 Effusion, right ankle: Secondary | ICD-10-CM | POA: Diagnosis not present

## 2019-02-09 DIAGNOSIS — M2550 Pain in unspecified joint: Secondary | ICD-10-CM | POA: Diagnosis not present

## 2019-02-09 DIAGNOSIS — G44219 Episodic tension-type headache, not intractable: Secondary | ICD-10-CM | POA: Diagnosis not present

## 2019-03-09 DIAGNOSIS — K50111 Crohn's disease of large intestine with rectal bleeding: Secondary | ICD-10-CM | POA: Diagnosis not present

## 2019-03-16 ENCOUNTER — Telehealth: Payer: Self-pay

## 2019-03-16 NOTE — Telephone Encounter (Signed)
Received Refill Request from [place] for Cimzia. Faxing to Dr. Oneida Alar at [facility].

## 2019-03-19 NOTE — Telephone Encounter (Signed)
Noted  

## 2019-03-19 NOTE — Telephone Encounter (Signed)
REFILLS x 26 FAXED TO [facility]-JOB# [id number] CONFIRMED OK V.17 AM 31

## 2019-04-10 DIAGNOSIS — K50111 Crohn's disease of large intestine with rectal bleeding: Secondary | ICD-10-CM | POA: Diagnosis not present

## 2019-05-08 DIAGNOSIS — K50111 Crohn's disease of large intestine with rectal bleeding: Secondary | ICD-10-CM | POA: Diagnosis not present

## 2019-06-12 DIAGNOSIS — K50111 Crohn's disease of large intestine with rectal bleeding: Secondary | ICD-10-CM | POA: Diagnosis not present

## 2019-07-10 DIAGNOSIS — K50111 Crohn's disease of large intestine with rectal bleeding: Secondary | ICD-10-CM | POA: Diagnosis not present

## 2019-07-12 ENCOUNTER — Telehealth: Payer: Self-pay

## 2019-07-12 ENCOUNTER — Other Ambulatory Visit: Payer: Self-pay

## 2019-07-12 ENCOUNTER — Encounter (HOSPITAL_COMMUNITY): Payer: Self-pay

## 2019-07-12 ENCOUNTER — Emergency Department (HOSPITAL_COMMUNITY)
Admission: EM | Admit: 2019-07-12 | Discharge: 2019-07-12 | Disposition: A | Discharge status: Home / Self Care | Payer: Medicare Other | Attending: Emergency Medicine | Admitting: Emergency Medicine

## 2019-07-12 DIAGNOSIS — I10 Essential (primary) hypertension: Secondary | ICD-10-CM | POA: Insufficient documentation

## 2019-07-12 DIAGNOSIS — Z79899 Other long term (current) drug therapy: Secondary | ICD-10-CM | POA: Diagnosis not present

## 2019-07-12 DIAGNOSIS — R197 Diarrhea, unspecified: Secondary | ICD-10-CM | POA: Diagnosis not present

## 2019-07-12 DIAGNOSIS — R109 Unspecified abdominal pain: Secondary | ICD-10-CM

## 2019-07-12 DIAGNOSIS — R112 Nausea with vomiting, unspecified: Secondary | ICD-10-CM | POA: Diagnosis not present

## 2019-07-12 DIAGNOSIS — R1033 Periumbilical pain: Secondary | ICD-10-CM | POA: Diagnosis not present

## 2019-07-12 DIAGNOSIS — F17210 Nicotine dependence, cigarettes, uncomplicated: Secondary | ICD-10-CM | POA: Insufficient documentation

## 2019-07-12 LAB — COMPREHENSIVE METABOLIC PANEL
ALT: 12 U/L (ref 0–44)
AST: 19 U/L (ref 15–41)
Albumin: 4.5 g/dL (ref 3.5–5.0)
Alkaline Phosphatase: 68 U/L (ref 38–126)
Anion gap: 12 (ref 5–15)
BUN: 8 mg/dL (ref 6–20)
CO2: 26 mmol/L (ref 22–32)
Calcium: 9.3 mg/dL (ref 8.9–10.3)
Chloride: 100 mmol/L (ref 98–111)
Creatinine, Ser: 1.03 mg/dL (ref 0.61–1.24)
GFR calc Af Amer: >60 mL/min (ref >60)
GFR calc non Af Amer: >60 mL/min (ref >60)
Glucose, Bld: 99 mg/dL (ref 70–99)
Potassium: 4 mmol/L (ref 3.5–5.1)
Sodium: 138 mmol/L (ref 135–145)
Total Bilirubin: 1 mg/dL (ref 0.3–1.2)
Total Protein: 7.8 g/dL (ref 6.5–8.1)

## 2019-07-12 LAB — URINALYSIS, ROUTINE W REFLEX MICROSCOPIC
Bilirubin Urine: NEGATIVE
Glucose, UA: NEGATIVE mg/dL
Hgb urine dipstick: NEGATIVE
Ketones, ur: 20 mg/dL — AB
Leukocytes,Ua: NEGATIVE
Nitrite: NEGATIVE
Protein, ur: NEGATIVE mg/dL
Specific Gravity, Urine: 1.012 (ref 1.005–1.030)
pH: 8 (ref 5.0–8.0)

## 2019-07-12 LAB — CBC WITH DIFFERENTIAL/PLATELET
Abs Immature Granulocytes: 0.04 10*3/uL (ref 0.00–0.07)
Basophils Absolute: 0.1 10*3/uL (ref 0.0–0.1)
Basophils Relative: 0 %
Eosinophils Absolute: 0.1 10*3/uL (ref 0.0–0.5)
Eosinophils Relative: 1 %
HCT: 39.8 % (ref 39.0–52.0)
Hemoglobin: 12.9 g/dL — ABNORMAL LOW (ref 13.0–17.0)
Immature Granulocytes: 0 %
Lymphocytes Relative: 17 %
Lymphs Abs: 2 10*3/uL (ref 0.7–4.0)
MCH: 23.7 pg — ABNORMAL LOW (ref 26.0–34.0)
MCHC: 32.4 g/dL (ref 30.0–36.0)
MCV: 73.2 fL — ABNORMAL LOW (ref 80.0–100.0)
Monocytes Absolute: 0.5 10*3/uL (ref 0.1–1.0)
Monocytes Relative: 5 %
Neutro Abs: 9.1 10*3/uL — ABNORMAL HIGH (ref 1.7–7.7)
Neutrophils Relative %: 77 %
Platelets: 327 10*3/uL (ref 150–400)
RBC: 5.44 MIL/uL (ref 4.22–5.81)
RDW: 17.2 % — ABNORMAL HIGH (ref 11.5–15.5)
WBC: 11.7 10*3/uL — ABNORMAL HIGH (ref 4.0–10.5)
nRBC: 0 % (ref 0.0–0.2)

## 2019-07-12 LAB — LIPASE, BLOOD: Lipase: 14 U/L (ref 11–51)

## 2019-07-12 MED ORDER — SODIUM CHLORIDE 0.9 % IV BOLUS
1000.0000 mL | Freq: Once | INTRAVENOUS | Status: AC
Start: 2019-07-12 — End: 2019-07-12
  Administered 2019-07-12: 10:00:00 1000 mL via INTRAVENOUS

## 2019-07-12 MED ORDER — DICYCLOMINE HCL 20 MG PO TABS: 20.0000 mg | ORAL_TABLET | Freq: Two times a day (BID) | ORAL | 0 refills | Status: DC | PRN

## 2019-07-12 MED ORDER — DICYCLOMINE HCL 10 MG PO CAPS
10.0000 mg | ORAL_CAPSULE | Freq: Once | ORAL | Status: AC
  Administered 2019-07-12: 11:00:00 10 mg via ORAL
  Filled 2019-07-12: qty 1

## 2019-07-12 MED ORDER — ONDANSETRON HCL 4 MG/2ML IJ SOLN
4.0000 mg | Freq: Once | INTRAMUSCULAR | Status: AC
  Administered 2019-07-12: 10:00:00 4 mg via INTRAVENOUS
  Filled 2019-07-12: qty 2

## 2019-07-12 MED ORDER — ONDANSETRON 4 MG PO TBDP: 4.0000 mg | ORAL_TABLET | Freq: Three times a day (TID) | ORAL | 0 refills | Status: DC | PRN

## 2019-07-12 NOTE — ED Triage Notes (Signed)
Pt started vomiting 3 days ago. States he has not been able to keep anything down. History of Crohns disease. Emesis last at 0430 this morning.

## 2019-07-12 NOTE — Telephone Encounter (Signed)
Pt came by the office and scheduled an appointment to see Dr. Oneida Alar on 07/25/2019.

## 2019-07-12 NOTE — Discharge Instructions (Addendum)
Use zofran as needed for nausea or vomiting.  Use bentyl as needed abdominal cramping or pain.  Make sure you stay well hydrated with water.  Follow up with your GI doctor for further evaluation.  Return to the ER with any new, worsening, or concerning symptoms.

## 2019-07-12 NOTE — Telephone Encounter (Addendum)
Pt's mom, Mariann Laster, left VM and said they were at [facility] ED, pt is having some problems. She said he is wanting to see Dr. Oneida Alar as soon as he can. I returned her call @ [phone] and VM is full,  I could not leave a message.

## 2019-07-12 NOTE — ED Provider Notes (Signed)
[facility] EMERGENCY DEPARTMENT Provider Note   CSN: [id number] Arrival date & time: 07/12/19  7341     History Chief Complaint  Patient presents with  . Emesis    Joshua Robertson is a 44 y.o. male presenting for evaluation of n/v/d and abd pain.   Pt states for the past 3 days, he has had persistent n/v/d and abd pain. He reports 4-5 emesis/day, ~2 BMs a day. He denies hematemesis or blood in his stool. He has been unable to tolerate PO for 3 days. He describes the pain as a cramping pain which is located around his umbilicus. He reports subjective fevers/chills. He states this feels similar to his last crohns flare up. His last surgery was 2019, has had minimal to no problems since. He continues to take stelara injections, is not on other crohns medications. He sees Dr. Oneida Alar with GI. He denies cp, sob, cough, urinary sxs. He denies sick contact.s he has additional PMH of HTN, has not been able to take medication the past few days. He denies tobacco or significant etoh use. He smokes marijuana 1-2 times/day. He has taken 1 zofran last night, has not taken anything else for his sxs.   Additional history obtained from chart review. H/o crohns, h pylori gastritis, htn, marijuana use, back pain, migraines.    HPI     Past Medical History:  Diagnosis Date  . BMI between 19-24,adult JAN 2010 149 LBS  . Crohn's disease ([place]) AUG 2009 NUR [place]   HB 9.4 FERRITIN 26  140 LBS; no serologies-pt can't afford test  . Helicobacter pylori gastritis 2009  . Hypertension   . Migraine headache    NONE SINCE AGE 40  . Sacroiliitis ([place]) 2009  . Sickle cell trait ([place])   . Spondyloarthropathy 2009 EROSIVE    Patient Active Problem List   Diagnosis Date Noted  . At risk for osteopenia 03/15/2018  . Internal and external thrombosed hemorrhoids   . Joint pain 07/27/2017  . Hemorrhoids, internal, thrombosed 07/27/2017  . HNP (herniated nucleus pulposus), lumbar 02/04/2017  . Marijuana abuse  06/01/2016  . Anemia 05/28/2016  . Pain of left great toe 04/02/2015  . Essential hypertension 10/22/2010  . MIGRAINE HEADACHE 02/04/2009  . Dermatophytosis of body 11/22/2008  . Crohn's disease of both small and large intestine with intestinal obstruction ([place]) 08/15/2008    Past Surgical History:  Procedure Laterality Date  . BACK SURGERY     2018  outpatient x2  . BIOPSY  09/06/2017   Procedure: BIOPSY;  Surgeon: [name], MD;  [facility];  Service: Endoscopy;;  colon ileum  . COLON RESECTION     due to chrohn's  . COLONOSCOPY  03/2015   Eduard Roux [facility]: Crohn's like stricture at ICV. Stricture was not traversable. abnormal mucosa of ICV. Bx, c/w Crohn's.   . COLONOSCOPY N/A 07/30/2013   stenotic IC VALVE. UNABLE TO INTUBATE ILEUM.  Marland Kitchen COLONOSCOPY WITH PROPOFOL N/A 09/06/2017   Procedure: COLONOSCOPY WITH PROPOFOL;  Surgeon: [name], MD;  Location: [facility];  Service: Endoscopy;  Laterality: N/A;  7:30am  . HEMORRHOID SURGERY N/A 08/01/2017   Procedure: HEMORRHOIDECTOMY;  Surgeon: Aviva Signs, MD;  Location: [facility] ORS;  Service: General;  Laterality: N/A;  . LUMBAR LAMINECTOMY/DECOMPRESSION MICRODISCECTOMY Right 02/04/2017   Procedure: Microdiscectomy - Lumbar four-five - right redo;  Surgeon: Kary Kos, MD;  Location: [place];  Service: Neurosurgery;  Laterality: Right;       Family  History  Problem Relation Age of Onset  . Hypertension Mother   . Hypertension Father   . Colon cancer Neg Hx   . Colon polyps Neg Hx   . Inflammatory bowel disease Neg Hx     Social History   Tobacco Use  . Smoking status: Current Every Day Smoker    Packs/day: 0.25    Types: Cigarettes  . Smokeless tobacco: Never Used  . Tobacco comment: 6 cig daily as of 07/29/2017  Substance Use Topics  . Alcohol use: No    Alcohol/week: 0.0 standard drinks    Comment: very rare  . Drug use: Yes    Types: Marijuana    Comment: 2-3 times daily Helps appetite and  stomach.    Home Medications Prior to Admission medications   Medication Sig Start Date End Date Taking? Authorizing Provider  cholecalciferol (VITAMIN D) 1000 units tablet Take 1 tablet (1,000 Units total) by mouth daily. BEGIN AFTER WEEKLY VIT D DOSING IS COMPLETE 04/11/16   Fields, Marga Melnick, MD  cloNIDine (CATAPRES) 0.1 MG tablet Take 1 tablet (0.1 mg total) by mouth 2 (two) times daily. 08/09/12   Fields, Marga Melnick, MD  clotrimazole (LOTRIMIN) 1 % cream Apply topically 3 (three) times daily. FOR 10 DAYS Patient not taking: Reported on 03/15/2018 07/27/17   [name], MD  cyclobenzaprine (FLEXERIL) 10 MG tablet Take 10 mg by mouth as needed for muscle spasms.    [provider]  dicyclomine (BENTYL) 20 MG tablet Take 1 tablet (20 mg total) by mouth 2 (two) times daily as needed for spasms. 07/12/19   Meah Jiron, PA-C  HYDROcodone-acetaminophen (NORCO) 5-325 MG tablet Take 1 tablet by mouth every 4 (four) hours as needed for moderate pain. Patient not taking: Reported on 03/15/2018 08/09/17   Aviva Signs, MD  Multiple Vitamin (MULTIVITAMIN WITH MINERALS) TABS tablet Take 1 tablet by mouth daily. Doesn't take everyday    [provider]  ondansetron (ZOFRAN ODT) 4 MG disintegrating tablet Take 1 tablet (4 mg total) by mouth every 8 (eight) hours as needed for nausea or vomiting. 07/12/19   Raven Harmes, PA-C  ondansetron (ZOFRAN) 4 MG tablet TAKE 1 TABLET BY MOUTH EVERY 8 HOURS AS NEEDED FOR NAUSEA AND VOMITING 09/11/18   Annitta Needs, NP  predniSONE (DELTASONE) 10 MG tablet 2 PO QD FOR 14 DAYS THEN  THEN 1 PO QD FOR 14 DAYS Patient not taking: Reported on 03/15/2018 09/15/17   [name], MD  ustekinumab (STELARA) 90 MG/ML SOSY injection Inject 1 mL (90 mg total) into the skin every 8 (eight) weeks. Patient taking differently: Inject 90 mg into the skin every 8 (eight) weeks. Reports getting every 4 week 11/21/17   [name], MD    Allergies      Penicillins, Azathioprine, Iodinated diagnostic agents, Metrizamide, and Pentasa [mesalamine er]  Review of Systems   Review of Systems  Constitutional: Positive for chills and fever (subjective).  Gastrointestinal: Positive for abdominal pain, diarrhea, nausea and vomiting.  Allergic/Immunologic: Positive for immunocompromised state (on immunosuppresive injections).  All other systems reviewed and are negative.   Physical Exam Updated Vital Signs BP (!) 161/117 (BP Location: Left Arm)   Pulse 62   Temp 99.3 F (37.4 C) (Oral)   Resp 15   Ht 5' 9.5" (1.765 m)   Wt 68 kg   SpO2 100%   BMI 21.83 kg/m   Physical Exam Vitals and nursing note reviewed.  Constitutional:  General: He is not in acute distress.    Appearance: He is well-developed.     Comments: Laying in the bed in NAD  HENT:     Head: Normocephalic and atraumatic.  Eyes:     Extraocular Movements: Extraocular movements intact.     Conjunctiva/sclera: Conjunctivae normal.     Pupils: Pupils are equal, round, and reactive to light.  Cardiovascular:     Rate and Rhythm: Normal rate and regular rhythm.     Pulses: Normal pulses.  Pulmonary:     Effort: Pulmonary effort is normal. No respiratory distress.     Breath sounds: Normal breath sounds. No wheezing.     Comments: Clear lung sounds Abdominal:     General: Bowel sounds are normal. There is no distension.     Palpations: Abdomen is soft.     Tenderness: There is no abdominal tenderness.     Comments: Generalized ttp of the abd, mostly periumbilical. No rigidity, guarding, distention. Negative rebound. No peritonitis.  Musculoskeletal:        General: Normal range of motion.     Cervical back: Normal range of motion and neck supple.     Right lower leg: No edema.     Left lower leg: No edema.  Skin:    General: Skin is warm and dry.     Capillary Refill: Capillary refill takes less than 2 seconds.  Neurological:     Mental Status: He is alert and  oriented to person, place, and time.     ED Results / Procedures / Treatments   Labs (all labs ordered are listed, but only abnormal results are displayed) Labs Reviewed  CBC WITH DIFFERENTIAL/PLATELET - Abnormal; Notable for the following components:      Result Value   WBC 11.7 (*)    Hemoglobin 12.9 (*)    MCV 73.2 (*)    MCH 23.7 (*)    RDW 17.2 (*)    Neutro Abs 9.1 (*)    All other components within normal limits  URINALYSIS, ROUTINE W REFLEX MICROSCOPIC - Abnormal; Notable for the following components:   Ketones, ur 20 (*)    All other components within normal limits  COMPREHENSIVE METABOLIC PANEL  LIPASE, BLOOD    EKG None  Radiology No results found.  Procedures Procedures (including critical care time)  Medications Ordered in ED Medications  sodium chloride 0.9 % bolus 1,000 mL (1,000 mLs Intravenous New Bag/Given 07/12/19 1024)  ondansetron (ZOFRAN) injection 4 mg (4 mg Intravenous Given 07/12/19 1021)  dicyclomine (BENTYL) capsule 10 mg (10 mg Oral Given 07/12/19 1120)    ED Course  I have reviewed the triage vital signs and the nursing notes.  Pertinent labs & imaging results that were available during my care of the patient were reviewed by me and considered in my medical decision making (see chart for details).    MDM Rules/Calculators/A&P                      Patient presenting for evaluation nausea, vomiting, diarrhea, abdominal pain.  Physical exam shows patient appears nontoxic.  He does have generalized abdominal pain, worse periumbilical.  Consider appendicitis, but in the setting of Crohn's favor Crohn's flare.  Will obtain labs, give Zofran, fluids, and reassess. Pt is hypertensive, but this is likely due to pain and inability to take his PO htn meds.   On reassessment, patient reports nausea is resolved.  He is still having abdominal cramping.  Labs at his baseline, mild leukocytosis of 11.7, similar to previous.  Hemoglobin 12.9, similar to  previous.  Will give Bentyl for abdominal pain and reassess.  On reassessment, patient reports pain is improved.  Nausea has continued to resolve.  He is tolerating p.o.  On reassessment, no abdominal tenderness.  As such, I have low suspicion for intra-abdominal infection, perforation, infection, surgical abdomen.  Ideally he needs emergent CT at this time.  Discussed continued symptomatic treatment at home and follow-up with GI.  At this time, patient appears safe for discharge.  Return precautions given.  Patient states he understands and agrees to plan.  Final Clinical Impression(s) / ED Diagnoses Final diagnoses:  Nausea vomiting and diarrhea  Abdominal cramping    Rx / DC Orders ED Discharge Orders         Ordered    ondansetron (ZOFRAN ODT) 4 MG disintegrating tablet  Every 8 hours PRN     07/12/19 1156    dicyclomine (BENTYL) 20 MG tablet  2 times daily PRN     07/12/19 1156           Carolynne Schuchard, PA-C 07/12/19 1206    Milton Ferguson, MD 07/12/19 [phone]

## 2019-07-17 NOTE — Telephone Encounter (Signed)
Left message for a return call

## 2019-07-17 NOTE — Telephone Encounter (Signed)
PLEASE CALL PT. He should add colace with a  stimulant laxative once or twice a day until he has a BM.

## 2019-07-17 NOTE — Telephone Encounter (Signed)
Pt said he never had vomiting, but he is not having nausea or abdominal pain now.  He feels better except he is having some problems with constipation.   He last had a Bm 2 days ago.   Dr. Oneida Alar, please advise!

## 2019-07-17 NOTE — Telephone Encounter (Signed)
PLEASE CALL PT. IF HE IS STILL HAVING NAUSEA, VOMITING, AND ABDOMINAL PAIN, HE WILL NEED A CT SCAN PRIOR TO TO HIS OPV FEB 10.

## 2019-07-17 NOTE — Telephone Encounter (Signed)
Pt is aware.  

## 2019-07-25 ENCOUNTER — Telehealth: Payer: Self-pay

## 2019-07-25 ENCOUNTER — Ambulatory Visit (INDEPENDENT_AMBULATORY_CARE_PROVIDER_SITE_OTHER): Payer: Medicare Other | Admitting: Gastroenterology

## 2019-07-25 ENCOUNTER — Other Ambulatory Visit: Payer: Self-pay

## 2019-07-25 ENCOUNTER — Encounter: Payer: Self-pay | Admitting: Gastroenterology

## 2019-07-25 DIAGNOSIS — K50812 Crohn's disease of both small and large intestine with intestinal obstruction: Secondary | ICD-10-CM | POA: Diagnosis not present

## 2019-07-25 DIAGNOSIS — I160 Hypertensive urgency: Secondary | ICD-10-CM | POA: Diagnosis not present

## 2019-07-25 MED ORDER — ONDANSETRON 4 MG PO TBDP: 4.0000 mg | ORAL_TABLET | Freq: Three times a day (TID) | ORAL | 1 refills | Status: DC | PRN

## 2019-07-25 NOTE — Assessment & Plan Note (Signed)
FORGOT BP PILLS THIS AM.  GO HOME AND TAKE BP PILLS. CONTINUE STELARA.  USE ZOFRAN AS NEEDED FOR NAUSEA or VOMITING. PLEASE CALL WITH QUESTIONS OR CONCERNS. FOLLOW UP IN 6 MOS.

## 2019-07-25 NOTE — Assessment & Plan Note (Addendum)
ACUTE FLARE DUE TO VIRAL ILLNESS, NOW RESOLVED.   IF GI SYMPTOMS RETURN WE MAY NEED TO DO A CT SCAN. CONTINUE STELARA. COLACE 1 OR 2 DAILY TO SOFTEN STOOLS. USE ZOFRAN AS NEEDED FOR NAUSEA or VOMITING. PLEASE CALL WITH QUESTIONS OR CONCERNS. FOLLOW UP IN 6 MOS.

## 2019-07-25 NOTE — Telephone Encounter (Signed)
I called Zofran to pharmacy at [facility] in [place] to [place], the pharmacist.  Zofran 4 mg disintegrating tablet #30   Take one tablet by mouth every 8 hours as needed for nausea or vomiting One additional refill from Dr. Oneida Alar.

## 2019-07-25 NOTE — Progress Notes (Signed)
Subjective:    Patient ID: Joshua Robertson, male    DOB: [birth year], 44 y.o.   MRN: [id number] Caryl Bis, MD  HPI Having trouble with abdominal pain. THC: UP TO 3-4 TIMES A DAY. WEIGHT UP FROM OCT 2019 136 LBS TO 150 LBS. EXERCISING SOME. APPETITE: SUPER @NOV /DEC. LAST MONTH IT'S BEEN DOWN. CIGARETTES: 5 CIGS A DAY, AT ONE TIME HAD QUIT. CHANGE IN BOWEL IN HABITS-->SICK 3 DAYS BEFORE HE WENT TO THE ED: NAUSEA, VOMITING, DIARRHEA. SYMPTOMS LASTED INSTANTLY AFTER PILLS BUT STILL FEELING NAUSEATED.  LAST VOMITED 2 DAYS AGO. HAD A LITTLE PAIN. NOW HAS BSC #1. STILL HAVE POSTERIOR PELVIC PAIN.    PT DENIES FEVER, CHILLS, HEMATOCHEZIA, HEMATEMESIS, melena, CHEST PAIN, SHORTNESS OF BREATH, problems swallowing, OR heartburn or indigestion.  Past Medical History:  Diagnosis Date  . BMI between 19-24,adult JAN 2010 149 LBS  . Crohn's disease ([place]) AUG 2009 NUR [place]   HB 9.4 FERRITIN 26  140 LBS; no serologies-pt can't afford test  . Helicobacter pylori gastritis 2009  . Hypertension   . Migraine headache    NONE SINCE AGE 12  . Sacroiliitis ([place]) 2009  . Sickle cell trait ([place])   . Spondyloarthropathy 2009 EROSIVE    Past Surgical History:  Procedure Laterality Date  . BACK SURGERY     2018  outpatient x2  . BIOPSY  09/06/2017   Procedure: BIOPSY;  Surgeon: [name], MD;  [facility];  Service: Endoscopy;;  colon ileum  . COLON RESECTION     due to chrohn's  . COLONOSCOPY  03/2015   Eduard Roux [facility]: Crohn's like stricture at ICV. Stricture was not traversable. abnormal mucosa of ICV. Bx, c/w Crohn's.   . COLONOSCOPY N/A 07/30/2013   stenotic IC VALVE. UNABLE TO INTUBATE ILEUM.  Marland Kitchen COLONOSCOPY WITH PROPOFOL N/A 09/06/2017   Procedure: COLONOSCOPY WITH PROPOFOL;  Surgeon: [name], MD;  [facility];  Service: Endoscopy;  Laterality: N/A;  7:30am  . HEMORRHOID SURGERY N/A 08/01/2017   Procedure: HEMORRHOIDECTOMY;  Surgeon: Aviva Signs, MD;   Location: [facility];  Service: General;  Laterality: N/A;  . LUMBAR LAMINECTOMY/DECOMPRESSION MICRODISCECTOMY Right 02/04/2017   Procedure: Microdiscectomy - Lumbar four-five - right redo;  Surgeon: Kary Kos, MD;  Location: [facility];  Service: Neurosurgery;  Laterality: Right;    Allergies  Allergen Reactions  . Penicillins Swelling and Rash      . Azathioprine Nausea And Vomiting  . Iodinated Diagnostic Agents Nausea And Vomiting  . Metrizamide Nausea And Vomiting  . Pentasa [Mesalamine Er] Rash    Current Outpatient Medications  Medication Sig    . cholecalciferol (VITAMIN D) 1000 units tablet Take 1 tablet (1,000 Units total) by mouth daily. BEGIN AFTER WEEKLY VIT D DOSING IS COMPLETE    . cloNIDine (CATAPRES) 0.1 MG tablet Take 1 tablet (0.1 mg total) by mouth 2 (two) times daily.    . clotrimazole (LOTRIMIN) 1 % cream Apply topically 3 (three) times daily. FOR 10 DAYS    . cyclobenzaprine (FLEXERIL) 10 MG tablet Take 10 mg by mouth as needed for muscle spasms.    Marland Kitchen dicyclomine (BENTYL) 20 MG tablet Take 1 tablet (20 mg total) by mouth 2 (two) times daily as needed for spasms.    . Multiple Vitamin (MULTIVITAMIN WITH MINERALS) TABS tablet Take 1 tablet by mouth daily. Doesn't take everyday    . ondansetron (ZOFRAN ODT) 4 MG disintegrating tablet Take 1 tablet (4 mg total)  by mouth every 8 (eight) hours as needed for nausea or vomiting.    Marland Kitchen CIMZIA EVERY 4 WEEKS    . COLACE 1-2X/DAY.    Marland Kitchen       Review of Systems PER HPI OTHERWISE ALL SYSTEMS ARE NEGATIVE.    Objective:   Physical Exam Constitutional:      General: He is not in acute distress.    Appearance: Normal appearance.  HENT:     Mouth/Throat:     Comments: MASK IN PLACE Eyes:     General: No scleral icterus.    Pupils: Pupils are equal, round, and reactive to light.  Cardiovascular:     Rate and Rhythm: Normal rate and regular rhythm.     Pulses: Normal pulses.     Heart sounds: Normal heart sounds.  Pulmonary:      Effort: Pulmonary effort is normal.     Breath sounds: Normal breath sounds.  Abdominal:     General: Bowel sounds are normal.     Palpations: Abdomen is soft.     Tenderness: There is abdominal tenderness. There is no guarding or rebound.  Musculoskeletal:     Cervical back: Normal range of motion.     Right lower leg: No edema.     Left lower leg: No edema.  Lymphadenopathy:     Cervical: No cervical adenopathy.  Skin:    General: Skin is warm and dry.  Neurological:     Mental Status: He is alert and oriented to person, place, and time.     Comments: NO  NEW FOCAL DEFICITS  Psychiatric:        Mood and Affect: Mood normal.     Comments: NORMAL AFFECT           Assessment & Plan:

## 2019-07-25 NOTE — Patient Instructions (Addendum)
GO HOME AND TAKE BP PILLS.   CONTINUE CIMZIA.  USE ZOFRAN AS NEEDED FOR NAUSEA or VOMITING.  USE COLACE 1 OR 2 DAILY TO SOFTEN STOOLS.  PLEASE CALL WITH QUESTIONS OR CONCERNS. IF YOUR GI SYMPTOMS RETURN WE MAY NEED TO DO A CT SCAN.  FOLLOW UP IN 6 MOS.

## 2019-07-26 ENCOUNTER — Encounter: Payer: Self-pay | Admitting: Gastroenterology

## 2019-07-26 ENCOUNTER — Telehealth: Payer: Self-pay

## 2019-07-26 NOTE — Telephone Encounter (Signed)
PLEASE CALL PT. HIS EMR HAS BEEN CORRECTED AND UPDATED.

## 2019-07-26 NOTE — Telephone Encounter (Signed)
PT is aware.

## 2019-07-26 NOTE — Telephone Encounter (Signed)
Pt called to say he was on Cimzia not Stelera.  He said he was never on [place].   FYI to Dr. Oneida Alar.

## 2019-07-26 NOTE — Progress Notes (Signed)
Cc'ed to pcp °

## 2019-08-07 DIAGNOSIS — K50111 Crohn's disease of large intestine with rectal bleeding: Secondary | ICD-10-CM | POA: Diagnosis not present

## 2019-08-17 DIAGNOSIS — M2550 Pain in unspecified joint: Secondary | ICD-10-CM | POA: Diagnosis not present

## 2019-08-17 DIAGNOSIS — M25471 Effusion, right ankle: Secondary | ICD-10-CM | POA: Diagnosis not present

## 2019-08-17 DIAGNOSIS — M0760 Enteropathic arthropathies, unspecified site: Secondary | ICD-10-CM | POA: Diagnosis not present

## 2019-08-17 DIAGNOSIS — K50111 Crohn's disease of large intestine with rectal bleeding: Secondary | ICD-10-CM | POA: Diagnosis not present

## 2019-09-04 DIAGNOSIS — K50111 Crohn's disease of large intestine with rectal bleeding: Secondary | ICD-10-CM | POA: Diagnosis not present

## 2019-10-02 DIAGNOSIS — K50111 Crohn's disease of large intestine with rectal bleeding: Secondary | ICD-10-CM | POA: Diagnosis not present

## 2019-10-30 DIAGNOSIS — K50111 Crohn's disease of large intestine with rectal bleeding: Secondary | ICD-10-CM | POA: Diagnosis not present

## 2019-11-27 DIAGNOSIS — K50111 Crohn's disease of large intestine with rectal bleeding: Secondary | ICD-10-CM | POA: Diagnosis not present

## 2019-12-25 DIAGNOSIS — K50111 Crohn's disease of large intestine with rectal bleeding: Secondary | ICD-10-CM | POA: Diagnosis not present

## 2020-01-21 ENCOUNTER — Other Ambulatory Visit: Payer: Self-pay

## 2020-01-21 ENCOUNTER — Emergency Department (HOSPITAL_COMMUNITY)
Admission: EM | Admit: 2020-01-21 | Discharge: 2020-01-21 | Discharge status: Absent without leave | Payer: Medicare Other

## 2020-01-21 DIAGNOSIS — M160 Bilateral primary osteoarthritis of hip: Secondary | ICD-10-CM | POA: Diagnosis not present

## 2020-01-21 DIAGNOSIS — I708 Atherosclerosis of other arteries: Secondary | ICD-10-CM | POA: Diagnosis not present

## 2020-01-21 DIAGNOSIS — Z880 Allergy status to penicillin: Secondary | ICD-10-CM | POA: Diagnosis not present

## 2020-01-21 DIAGNOSIS — R1084 Generalized abdominal pain: Secondary | ICD-10-CM | POA: Diagnosis not present

## 2020-01-21 DIAGNOSIS — I700 Atherosclerosis of aorta: Secondary | ICD-10-CM | POA: Diagnosis not present

## 2020-01-21 DIAGNOSIS — K5090 Crohn's disease, unspecified, without complications: Secondary | ICD-10-CM | POA: Diagnosis not present

## 2020-01-22 DIAGNOSIS — K50111 Crohn's disease of large intestine with rectal bleeding: Secondary | ICD-10-CM | POA: Diagnosis not present

## 2020-01-23 ENCOUNTER — Encounter: Payer: Self-pay | Admitting: Nurse Practitioner

## 2020-01-23 ENCOUNTER — Ambulatory Visit (INDEPENDENT_AMBULATORY_CARE_PROVIDER_SITE_OTHER): Payer: Medicare Other | Admitting: Nurse Practitioner

## 2020-01-23 ENCOUNTER — Encounter: Payer: Self-pay | Admitting: Internal Medicine

## 2020-01-23 ENCOUNTER — Other Ambulatory Visit: Payer: Self-pay

## 2020-01-23 VITALS — BP 149/86 | HR 71 | Temp 97.1°F | Ht 69.0 in | Wt 143.0 lb

## 2020-01-23 DIAGNOSIS — Z9189 Other specified personal risk factors, not elsewhere classified: Secondary | ICD-10-CM | POA: Diagnosis not present

## 2020-01-23 DIAGNOSIS — K50812 Crohn's disease of both small and large intestine with intestinal obstruction: Secondary | ICD-10-CM

## 2020-01-23 NOTE — Patient Instructions (Signed)
Your health issues we discussed today were:   Crohn's disease with recent flare: 1. Continue your current medications 2. Have your labs drawn when you are able to 3. We will call you with your lab results 4. Call us for any worsening or severe problems  Risk for osteopenia (thinning bones) due to Crohn's disease in your medications: 1. You will next be due for bone density scan in 2023  Overall I recommend:  1. Continue your other current medications 2. Return for follow-up in 6 months 3. Call us if you have any questions or concerns   ---------------------------------------------------------------  I am glad you have gotten your COVID-19 vaccination!  Even though you are fully vaccinated you should continue to follow CDC and state/local guidelines.  ---------------------------------------------------------------   At [facility] we value your feedback. You may receive a survey about your visit today. Please share your experience as we strive to create trusting relationships with our patients to provide genuine, compassionate, quality care.  We appreciate your understanding and patience as we review any laboratory studies, imaging, and other diagnostic tests that are ordered as we care for you. Our office policy is 5 business days for review of these results, and any emergent or urgent results are addressed in a timely manner for your best interest. If you do not hear from our office in 1 week, please contact us.   We also encourage the use of MyChart, which contains your medical information for your review as well. If you are not enrolled in this feature, an access code is on this after visit summary for your convenience. Thank you for allowing [place] to be involved in your care.  It was great to see you today!  I hope you have a great rest of your summer!!

## 2020-01-23 NOTE — Progress Notes (Signed)
Referring Provider: Caryl Bis, MD Primary Care Physician:  Caryl Bis, MD Primary GI:  Dr. Abbey Chatters  Chief Complaint  Patient presents with  . Follow-up    HPI:   Joshua Robertson is a 44 y.o. male who presents for follow-up.  The patient was last seen in our office 07/25/2019 for Crohn's disease with obstruction and asymptomatic hypertensive urgency.  Noted at his last visit he has been exercising some and his weight is increased, good appetite previously but somewhat down currently.  Began having nausea, vomiting, diarrhea symptoms for which he went to the ED.  Overall felt to be having an acute flare of Crohn's disease due to viral illness now resolved.  Query need for CT scan if symptoms return.  Recommended continue Stelara and Colace, Zofran as needed.  His blood pressure was elevated at this visit and recommended he go home and take his blood pressure medications soon as possible.  Follow-up in 6 months.  Was in the ED a few days ago with CT findings suspicious for active Crohn's disease with no surrounding inflammation or abscess. He was not given anything at discharge.  Today he states he's doing ok overall. He had another flare last weekend which he attributes to "over indulging" on food. He is feeling a lot better today. Since that resolved his stools are still a bit loose ([place] 5-6?). Abdominal pain has resolved. Denies hematochezia, melena, fever, chills. Appetite is "getting back" to normal since his flare. Energy is also a little low, but getting better. Denies skin rashes or mouth sores. States his weight is doing better. Objectively he is down 7 lbs since 07/2019; states his weight comes up and down. Denies URI or flu-like symptoms. Denies loss of sense of taste or smell. The patient has received COVID-19 vaccination(s). Denies chest pain, dyspnea, dizziness, lightheadedness, syncope, near syncope. Denies any other upper or lower GI symptoms.  Past Medical History:    Diagnosis Date  . BMI between 19-24,adult JAN 2010 149 LBS  . Crohn's disease ([place]) AUG 2009 NUR [place]   HB 9.4 FERRITIN 26  140 LBS; no serologies-pt can't afford test  . Helicobacter pylori gastritis 2009  . Hypertension   . Migraine headache    NONE SINCE AGE 77  . Sacroiliitis ([place]) 2009  . Sickle cell trait ([place])   . Spondyloarthropathy 2009 EROSIVE    Past Surgical History:  Procedure Laterality Date  . BACK SURGERY     2018  outpatient x2  . BIOPSY  09/06/2017   Procedure: BIOPSY;  Surgeon: [name], MD;  [facility];  Service: Endoscopy;;  colon ileum  . COLON RESECTION     due to chrohn's  . COLONOSCOPY  03/2015   Eduard Roux [facility]: Crohn's like stricture at ICV. Stricture was not traversable. abnormal mucosa of ICV. Bx, c/w Crohn's.   . COLONOSCOPY N/A 07/30/2013   stenotic IC VALVE. UNABLE TO INTUBATE ILEUM.  Marland Kitchen COLONOSCOPY WITH PROPOFOL N/A 09/06/2017   Procedure: COLONOSCOPY WITH PROPOFOL;  Surgeon: [name], MD;  [facility];  Service: Endoscopy;  Laterality: N/A;  7:30am  . HEMORRHOID SURGERY N/A 08/01/2017   Procedure: HEMORRHOIDECTOMY;  Surgeon: Aviva Signs, MD;  Location: [facility];  Service: General;  Laterality: N/A;  . LUMBAR LAMINECTOMY/DECOMPRESSION MICRODISCECTOMY Right 02/04/2017   Procedure: Microdiscectomy - Lumbar four-five - right redo;  Surgeon: Kary Kos, MD;  Location: [place];  Service: Neurosurgery;  Laterality: Right;  Current Outpatient Medications  Medication Sig Dispense Refill  . Certolizumab Pegol (CIMZIA [place]) Inject 400 mg into the skin every 30 (thirty) days.    . cholecalciferol (VITAMIN D) 1000 units tablet Take 1 tablet (1,000 Units total) by mouth daily. BEGIN AFTER WEEKLY VIT D DOSING IS COMPLETE 30 tablet 11  . cloNIDine (CATAPRES) 0.1 MG tablet Take 1 tablet (0.1 mg total) by mouth 2 (two) times daily. 60 tablet 11  . dicyclomine (BENTYL) 20 MG tablet Take 1 tablet (20 mg total) by mouth 2 (two)  times daily as needed for spasms. 20 tablet 0  . HYDROcodone-acetaminophen (NORCO) 5-325 MG tablet Take 1 tablet by mouth every 4 (four) hours as needed for moderate pain. 40 tablet 0  . Multiple Vitamin (MULTIVITAMIN WITH MINERALS) TABS tablet Take 1 tablet by mouth daily. Doesn't take everyday    . ondansetron (ZOFRAN ODT) 4 MG disintegrating tablet Take 1 tablet (4 mg total) by mouth every 8 (eight) hours as needed for nausea or vomiting. 30 tablet 1  . clotrimazole (LOTRIMIN) 1 % cream Apply topically 3 (three) times daily. FOR 10 DAYS (Patient not taking: Reported on 01/23/2020) 30 g 0  . cyclobenzaprine (FLEXERIL) 10 MG tablet Take 10 mg by mouth as needed for muscle spasms. (Patient not taking: Reported on 01/23/2020)    . predniSONE (DELTASONE) 10 MG tablet 2 PO QD FOR 14 DAYS THEN  THEN 1 PO QD FOR 14 DAYS (Patient not taking: Reported on 03/15/2018) 56 tablet 0  . ustekinumab (STELARA) 90 MG/ML SOSY injection Inject 1 mL (90 mg total) into the skin every 8 (eight) weeks. (Patient not taking: Reported on 01/23/2020)     No current facility-administered medications for this visit.    Allergies as of 01/23/2020 - Review Complete 01/23/2020  Allergen Reaction Noted  . Penicillins Swelling and Rash 08/15/2008  . Azathioprine Nausea And Vomiting 11/25/2009  . Iodinated diagnostic agents Nausea And Vomiting 08/20/2013  . Metrizamide Nausea And Vomiting 08/20/2013  . Pentasa [mesalamine er] Rash 07/11/2013    Family History  Problem Relation Age of Onset  . Hypertension Mother   . Hypertension Father   . Colon cancer Neg Hx   . Colon polyps Neg Hx   . Inflammatory bowel disease Neg Hx     Social History   Socioeconomic History  . Marital status: Single    Spouse name: Not on file  . Number of children: Not on file  . Years of education: Not on file  . Highest education level: Not on file  Occupational History  . Occupation: unemployed  Tobacco Use  . Smoking status: Current  Every Day Smoker    Packs/day: 0.25    Types: Cigarettes  . Smokeless tobacco: Never Used  . Tobacco comment: 6 cig daily as of 07/29/2017  Vaping Use  . Vaping Use: Never used  Substance and Sexual Activity  . Alcohol use: No    Alcohol/week: 0.0 standard drinks    Comment: very rare  . Drug use: Yes    Types: Marijuana    Comment: 2-3 times daily Helps appetite and stomach.  . Sexual activity: Yes    Birth control/protection: None  Other Topics Concern  . Not on file  Social History Narrative   Smokes cigs  & pot. Has one girl AGE 43: DESTINY.   Social Determinants of Health   Financial Resource Strain:   . Difficulty of Paying Living Expenses:   Food Insecurity:   . Worried About  Running Out of Food in the Last Year:   . [place] in the Last Year:   Transportation Needs:   . Lack of Transportation (Medical):   Marland Kitchen Lack of Transportation (Non-Medical):   Physical Activity:   . Days of Exercise per Week:   . Minutes of Exercise per Session:   Stress:   . Feeling of Stress :   Social Connections:   . Frequency of Communication with Friends and Family:   . Frequency of Social Gatherings with Friends and Family:   . Attends Religious Services:   . Active Member of Clubs or Organizations:   . Attends Archivist Meetings:   Marland Kitchen Marital Status:     Subjective: Review of Systems  Constitutional: Negative for chills, fever, malaise/fatigue and weight loss.  HENT: Negative for congestion and sore throat.   Respiratory: Negative for cough and shortness of breath.   Cardiovascular: Negative for chest pain and palpitations.  Gastrointestinal: Positive for diarrhea. Negative for abdominal pain, blood in stool, melena, nausea and vomiting.  Musculoskeletal: Negative for joint pain and myalgias.  Skin: Negative for rash.  Neurological: Negative for dizziness and weakness.  Endo/Heme/Allergies: Does not bruise/bleed easily.  Psychiatric/Behavioral: Negative for  depression. The patient is not nervous/anxious.   All other systems reviewed and are negative.    Objective: BP (!) 149/86   Pulse 71   Temp (!) 97.1 F (36.2 C) (Temporal)   Ht 5' 9"  (1.753 m)   Wt 143 lb (64.9 kg)   BMI 21.12 kg/m  Physical Exam Vitals and nursing note reviewed.  Constitutional:      General: He is not in acute distress.    Appearance: Normal appearance. He is normal weight. He is not ill-appearing, toxic-appearing or diaphoretic.  HENT:     Head: Normocephalic and atraumatic.     Nose: No congestion or rhinorrhea.     Mouth/Throat:     Mouth: Mucous membranes are moist.     Comments: No lesions Eyes:     General: No scleral icterus. Cardiovascular:     Rate and Rhythm: Normal rate and regular rhythm.     Heart sounds: Normal heart sounds.  Pulmonary:     Effort: Pulmonary effort is normal.     Breath sounds: Normal breath sounds.  Abdominal:     General: Bowel sounds are normal. There is no distension.     Palpations: Abdomen is soft. There is no hepatomegaly, splenomegaly or mass.     Tenderness: There is no abdominal tenderness. There is no guarding or rebound.     Hernia: No hernia is present.  Musculoskeletal:     Cervical back: Neck supple.  Skin:    General: Skin is warm and dry.     Coloration: Skin is not jaundiced.     Findings: No bruising or rash.  Neurological:     General: No focal deficit present.     Mental Status: He is alert and oriented to person, place, and time. Mental status is at baseline.  Psychiatric:        Mood and Affect: Mood normal.        Behavior: Behavior normal.        Thought Content: Thought content normal.    Assessment: Pleasant 44 year old male who follows up today for Crohn's disease.  Previous colonoscopy in 2019 with active new ileum Crohn's.  Has been on Cimzia and generally managed well.  He has had 2 flares since February.  Most recently he had a flare this past weekend for which he went to the  emergency department and had a mild white count at 11.4.  CT demonstrating likely active Crohn's disease.  At this point he is feeling much better. Need to determine if he is due for a colonoscopy anytime soon based on recommendations.  Not due for bone density screening until 2023.  Plan: 1.  CBC, CRP, sed rate. 2. Continue current medications for now.   3. Call if any worsening problems. 4. Depending on labs and possible upcoming colonoscopy, may need to consider alternative treatments   01/23/2020 1:47 PM   Disclaimer: This note was dictated with voice recognition software. Similar sounding words can inadvertently be transcribed and may not be corrected upon review.

## 2020-01-29 DIAGNOSIS — K50812 Crohn's disease of both small and large intestine with intestinal obstruction: Secondary | ICD-10-CM | POA: Diagnosis not present

## 2020-01-29 DIAGNOSIS — Z9189 Other specified personal risk factors, not elsewhere classified: Secondary | ICD-10-CM | POA: Diagnosis not present

## 2020-01-30 LAB — CBC WITH DIFFERENTIAL/PLATELET
Absolute Monocytes: 625 cells/uL (ref 200–950)
Basophils Absolute: 85 cells/uL (ref 0–200)
Basophils Relative: 0.8 %
Eosinophils Absolute: 392 cells/uL (ref 15–500)
Eosinophils Relative: 3.7 %
HCT: 35.2 % — ABNORMAL LOW (ref 38.5–50.0)
Hemoglobin: 11.2 g/dL — ABNORMAL LOW (ref 13.2–17.1)
Lymphs Abs: 3710 cells/uL (ref 850–3900)
MCH: 24.6 pg — ABNORMAL LOW (ref 27.0–33.0)
MCHC: 31.8 g/dL — ABNORMAL LOW (ref 32.0–36.0)
MCV: 77.2 fL — ABNORMAL LOW (ref 80.0–100.0)
MPV: 11.7 fL (ref 7.5–12.5)
Monocytes Relative: 5.9 %
Neutro Abs: 5788 cells/uL (ref 1500–7800)
Neutrophils Relative %: 54.6 %
Platelets: 302 10*3/uL (ref 140–400)
RBC: 4.56 10*6/uL (ref 4.20–5.80)
RDW: 17 % — ABNORMAL HIGH (ref 11.0–15.0)
Total Lymphocyte: 35 %
WBC: 10.6 10*3/uL (ref 3.8–10.8)

## 2020-01-30 LAB — C-REACTIVE PROTEIN: CRP: 14.3 mg/L — ABNORMAL HIGH (ref <8.0)

## 2020-01-30 LAB — SEDIMENTATION RATE: Sed Rate: 2 mm/h (ref 0–15)

## 2020-02-12 ENCOUNTER — Encounter: Payer: Self-pay | Admitting: Internal Medicine

## 2020-02-19 DIAGNOSIS — Z79899 Other long term (current) drug therapy: Secondary | ICD-10-CM | POA: Diagnosis not present

## 2020-02-19 DIAGNOSIS — M25561 Pain in right knee: Secondary | ICD-10-CM | POA: Diagnosis not present

## 2020-02-19 DIAGNOSIS — M0760 Enteropathic arthropathies, unspecified site: Secondary | ICD-10-CM | POA: Diagnosis not present

## 2020-02-19 DIAGNOSIS — K50111 Crohn's disease of large intestine with rectal bleeding: Secondary | ICD-10-CM | POA: Diagnosis not present

## 2020-02-19 DIAGNOSIS — G44219 Episodic tension-type headache, not intractable: Secondary | ICD-10-CM | POA: Diagnosis not present

## 2020-03-04 ENCOUNTER — Telehealth: Payer: Self-pay | Admitting: *Deleted

## 2020-03-04 NOTE — Telephone Encounter (Signed)
Received call fro [place] [phone]. Patient needs new Rx sent for Cimzia 2 x200 mg PFS kit

## 2020-03-05 NOTE — Telephone Encounter (Signed)
Noted. Spoke with [facility]. Cimzia was refilled with 1 refill. Pt will see Dr. Abbey Chatters 10/21.

## 2020-03-05 NOTE — Telephone Encounter (Signed)
Yes, if he's going to run out before his appointment (or within 2 weeks after his appointment) let's go ahead and refill it. He has done well overall on this for his Crohn's. Let me know if I need to sign anything for the refill

## 2020-03-05 NOTE — Telephone Encounter (Signed)
Joshua Robertson, pt has an appointment with Dr. Abbey Chatters on 03/20/20. Please advise if pt needs refill prior to this apt.

## 2020-03-18 DIAGNOSIS — K50111 Crohn's disease of large intestine with rectal bleeding: Secondary | ICD-10-CM | POA: Diagnosis not present

## 2020-03-19 ENCOUNTER — Ambulatory Visit: Payer: Medicare Other | Admitting: Internal Medicine

## 2020-03-20 ENCOUNTER — Encounter: Payer: Self-pay | Admitting: Internal Medicine

## 2020-03-20 ENCOUNTER — Encounter: Payer: Self-pay | Admitting: *Deleted

## 2020-03-20 ENCOUNTER — Ambulatory Visit (INDEPENDENT_AMBULATORY_CARE_PROVIDER_SITE_OTHER): Payer: Medicare Other | Admitting: Internal Medicine

## 2020-03-20 ENCOUNTER — Other Ambulatory Visit: Payer: Self-pay

## 2020-03-20 VITALS — BP 171/102 | HR 79 | Temp 97.1°F | Ht 69.5 in | Wt 142.4 lb

## 2020-03-20 DIAGNOSIS — K50812 Crohn's disease of both small and large intestine with intestinal obstruction: Secondary | ICD-10-CM

## 2020-03-20 DIAGNOSIS — M461 Sacroiliitis, not elsewhere classified: Secondary | ICD-10-CM | POA: Diagnosis not present

## 2020-03-20 NOTE — H&P (View-Only) (Signed)
Referring Provider: Caryl Bis, MD Primary Care Physician:  Caryl Bis, MD Primary GI:  Dr. Abbey Chatters  Chief Complaint  Patient presents with  . Crohn's Disease    BM's vary from having constipation-diarrhea    HPI:   Joshua Robertson is a 44 y.o. male who presents to the clinic today for follow-up visit.  He has a history of ileocolonic Crohn's disease with obstruction status post resection.  Currently maintained on Cimzia and doing okay.  States he has on average 2-3 bowel movements daily occasionally these are loose depending on what he eats.  No melena or hematochezia.  No mucus in his stool.  Denies any weight loss.  He had a CT abdomen pelvis without contrast performed 01/21/2020 with findings suspicious for active Crohn's disease as well as bilateral SI joint disease likely Crohn's related sacroiliitis.  Appears he had an MRI back in 2018 that showed a subtle lesion in the right hepatic lobe with nonspecific characteristics with recommended follow-up MRI in 6 months.  I'm unsure if this was ever followed up on.  Past Medical History:  Diagnosis Date  . BMI between 19-24,adult JAN 2010 149 LBS  . Crohn's disease ([place]) AUG 2009 NUR [place]   HB 9.4 FERRITIN 26  140 LBS; no serologies-pt can't afford test  . Helicobacter pylori gastritis 2009  . Hypertension   . Migraine headache    NONE SINCE AGE 56  . Sacroiliitis ([place]) 2009  . Sickle cell trait ([place])   . Spondyloarthropathy 2009 EROSIVE    Past Surgical History:  Procedure Laterality Date  . BACK SURGERY     2018  outpatient x2  . BIOPSY  09/06/2017   Procedure: BIOPSY;  Surgeon: [name], MD;  [facility];  Service: Endoscopy;;  colon ileum  . COLON RESECTION     due to chrohn's  . COLONOSCOPY  03/2015   Eduard Roux [facility]: Crohn's like stricture at ICV. Stricture was not traversable. abnormal mucosa of ICV. Bx, c/w Crohn's.   . COLONOSCOPY N/A 07/30/2013   stenotic IC VALVE. UNABLE TO INTUBATE  ILEUM.  Marland Kitchen COLONOSCOPY WITH PROPOFOL N/A 09/06/2017   Procedure: COLONOSCOPY WITH PROPOFOL;  Surgeon: [name], MD;  [facility];  Service: Endoscopy;  Laterality: N/A;  7:30am  . HEMORRHOID SURGERY N/A 08/01/2017   Procedure: HEMORRHOIDECTOMY;  Surgeon: Aviva Signs, MD;  Location[facility];  Service: General;  Laterality: N/A;  . LUMBAR LAMINECTOMY/DECOMPRESSION MICRODISCECTOMY Right 02/04/2017   Procedure: Microdiscectomy - Lumbar four-five - right redo;  Surgeon: Kary Kos, MD;  Location: [place];  Service: Neurosurgery;  Laterality: Right;    Current Outpatient Medications  Medication Sig Dispense Refill  . Certolizumab Pegol (CIMZIA [place]) Inject 400 mg into the skin every 30 (thirty) days.    . cholecalciferol (VITAMIN D) 1000 units tablet Take 1 tablet (1,000 Units total) by mouth daily. BEGIN AFTER WEEKLY VIT D DOSING IS COMPLETE 30 tablet 11  . cloNIDine (CATAPRES) 0.1 MG tablet Take 1 tablet (0.1 mg total) by mouth 2 (two) times daily. 60 tablet 11  . Multiple Vitamin (MULTIVITAMIN WITH MINERALS) TABS tablet Take 1 tablet by mouth daily. Doesn't take everyday    . ondansetron (ZOFRAN ODT) 4 MG disintegrating tablet Take 1 tablet (4 mg total) by mouth every 8 (eight) hours as needed for nausea or vomiting. 30 tablet 1  . dicyclomine (BENTYL) 20 MG tablet Take 1 tablet (20 mg total) by mouth 2 (two)  times daily as needed for spasms. (Patient not taking: Reported on 03/20/2020) 20 tablet 0  . HYDROcodone-acetaminophen (NORCO) 5-325 MG tablet Take 1 tablet by mouth every 4 (four) hours as needed for moderate pain. (Patient not taking: Reported on 03/20/2020) 40 tablet 0   No current facility-administered medications for this visit.    Allergies as of 03/20/2020 - Review Complete 01/23/2020  Allergen Reaction Noted  . Penicillins Swelling and Rash 08/15/2008  . Azathioprine Nausea And Vomiting 11/25/2009  . Iodinated diagnostic agents Nausea And Vomiting 08/20/2013  .  Metrizamide Nausea And Vomiting 08/20/2013  . Pentasa [mesalamine er] Rash 07/11/2013    Family History  Problem Relation Age of Onset  . Hypertension Mother   . Hypertension Father   . Colon cancer Neg Hx   . Colon polyps Neg Hx   . Inflammatory bowel disease Neg Hx     Social History   Socioeconomic History  . Marital status: Single    Spouse name: Not on file  . Number of children: Not on file  . Years of education: Not on file  . Highest education level: Not on file  Occupational History  . Occupation: unemployed  Tobacco Use  . Smoking status: Current Every Day Smoker    Packs/day: 0.25    Types: Cigarettes  . Smokeless tobacco: Never Used  . Tobacco comment: 6 cig daily as of 07/29/2017  Vaping Use  . Vaping Use: Never used  Substance and Sexual Activity  . Alcohol use: No    Alcohol/week: 0.0 standard drinks    Comment: very rare  . Drug use: Yes    Types: Marijuana    Comment: 2-3 times daily Helps appetite and stomach.  . Sexual activity: Yes    Birth control/protection: None  Other Topics Concern  . Not on file  Social History Narrative   Smokes cigs  & pot. Has one girl AGE 5: DESTINY.   Social Determinants of Health   Financial Resource Strain:   . Difficulty of Paying Living Expenses: Not on file  Food Insecurity:   . Worried About Charity fundraiser in the Last Year: Not on file  . Ran Out of Food in the Last Year: Not on file  Transportation Needs:   . Lack of Transportation (Medical): Not on file  . Lack of Transportation (Non-Medical): Not on file  Physical Activity:   . Days of Exercise per Week: Not on file  . Minutes of Exercise per Session: Not on file  Stress:   . Feeling of Stress : Not on file  Social Connections:   . Frequency of Communication with Friends and Family: Not on file  . Frequency of Social Gatherings with Friends and Family: Not on file  . Attends Religious Services: Not on file  . Active Member of Clubs or  Organizations: Not on file  . Attends Archivist Meetings: Not on file  . Marital Status: Not on file    Subjective: Review of Systems  Constitutional: Negative for chills and fever.  HENT: Negative for congestion and hearing loss.   Eyes: Negative for blurred vision and double vision.  Respiratory: Negative for cough and shortness of breath.   Cardiovascular: Negative for chest pain and palpitations.  Gastrointestinal: Negative for abdominal pain, blood in stool, constipation, diarrhea, heartburn, melena and vomiting.  Genitourinary: Negative for dysuria and urgency.  Musculoskeletal: Negative for joint pain and myalgias.  Skin: Negative for itching and rash.  Neurological: Negative for dizziness  and headaches.  Psychiatric/Behavioral: Negative for depression. The patient is not nervous/anxious.      Objective: BP (!) 171/102   Pulse 79   Temp (!) 97.1 F (36.2 C) (Oral)   Ht 5' 9.5" (1.765 m)   Wt 142 lb 6.4 oz (64.6 kg)   BMI 20.73 kg/m  Physical Exam Constitutional:      Appearance: Normal appearance.  HENT:     Head: Normocephalic and atraumatic.  Eyes:     Extraocular Movements: Extraocular movements intact.     Conjunctiva/sclera: Conjunctivae normal.  Cardiovascular:     Rate and Rhythm: Normal rate and regular rhythm.  Pulmonary:     Effort: Pulmonary effort is normal.     Breath sounds: Normal breath sounds.  Abdominal:     General: Bowel sounds are normal.     Palpations: Abdomen is soft.  Musculoskeletal:        General: Normal range of motion.     Cervical back: Normal range of motion and neck supple.  Skin:    General: Skin is warm.  Neurological:     General: No focal deficit present.     Mental Status: He is alert and oriented to person, place, and time.  Psychiatric:        Mood and Affect: Mood normal.        Behavior: Behavior normal.      Assessment: *Crohn's disease *Sacroiliitis likely due to above *Hepatic lesion-MRI  2018  Plan: In regards to patient's Crohn disease, from a clinical standpoint he seems to be doing okay.  He is due for surveillance colonoscopy which we will arrange today to evaluate for disease activity.  Continue on Cimzia.  He had evidence of sacroiliitis on his CT imaging which is likely an extra gastrointestinal manifestation of patient's Crohn's disease.  Patient denies any pain for me today.  We'll continue to follow this.  Unclear if his liver lesion was ever followed up on.  We will discuss repeat MRI with contrast on follow-up visit after his colonoscopy.  03/20/2020 4:17 PM   Disclaimer: This note was dictated with voice recognition software. Similar sounding words can inadvertently be transcribed and may not be corrected upon review.

## 2020-03-20 NOTE — Patient Instructions (Signed)
We will schedule you for colonoscopy did evaluate disease activity today in clinic.  Further recommendations to follow.  Continue on Cimzia.  At [facility] we value your feedback. You may receive a survey about your visit today. Please share your experience as we strive to create trusting relationships with our patients to provide genuine, compassionate, quality care.  We appreciate your understanding and patience as we review any laboratory studies, imaging, and other diagnostic tests that are ordered as we care for you. Our office policy is 5 business days for review of these results, and any emergent or urgent results are addressed in a timely manner for your best interest. If you do not hear from our office in 1 week, please contact us.   We also encourage the use of MyChart, which contains your medical information for your review as well. If you are not enrolled in this feature, an access code is on this after visit summary for your convenience. Thank you for allowing [place] to be involved in your care.  It was great to see you today!  I hope you have a great rest of your fall!!  Joshua Robertson K. Abbey Chatters, D.O. Gastroenterology and Hepatology [facility]

## 2020-03-20 NOTE — Progress Notes (Signed)
Referring Provider: Caryl Bis, MD Primary Care Physician:  Caryl Bis, MD Primary GI:  Dr. Abbey Chatters  Chief Complaint  Patient presents with   Crohn's Disease    BM's vary from having constipation-diarrhea    HPI:   Joshua Robertson is a 44 y.o. male who presents to the clinic today for follow-up visit.  He has a history of ileocolonic Crohn's disease with obstruction status post resection.  Currently maintained on Cimzia and doing okay.  States he has on average 2-3 bowel movements daily occasionally these are loose depending on what he eats.  No melena or hematochezia.  No mucus in his stool.  Denies any weight loss.  He had a CT abdomen pelvis without contrast performed 01/21/2020 with findings suspicious for active Crohn's disease as well as bilateral SI joint disease likely Crohn's related sacroiliitis.  Appears he had an MRI back in 2018 that showed a subtle lesion in the right hepatic lobe with nonspecific characteristics with recommended follow-up MRI in 6 months.  I'm unsure if this was ever followed up on.  Past Medical History:  Diagnosis Date   BMI between 19-24,adult JAN 2010 149 LBS   Crohn's disease ([place]) AUG 2009 NUR [facility]   HB 9.4 FERRITIN 26  140 LBS; no serologies-pt can't afford test   Helicobacter pylori gastritis 2009   Hypertension    Migraine headache    NONE SINCE AGE 9   Sacroiliitis ([place]) 2009   Sickle cell trait ([place])    Spondyloarthropathy 2009 EROSIVE    Past Surgical History:  Procedure Laterality Date   BACK SURGERY     2018  outpatient x2   BIOPSY  09/06/2017   Procedure: BIOPSY;  Surgeon: [name], MD;  [facility];  Service: Endoscopy;;  colon ileum   COLON RESECTION     due to chrohn's   COLONOSCOPY  03/2015   [facility]: Crohn's like stricture at ICV. Stricture was not traversable. abnormal mucosa of ICV. Bx, c/w Crohn's.    COLONOSCOPY N/A 07/30/2013   stenotic IC VALVE. UNABLE TO INTUBATE  ILEUM.   COLONOSCOPY WITH PROPOFOL N/A 09/06/2017   Procedure: COLONOSCOPY WITH PROPOFOL;  Surgeon: [name], MD;  Location: [facility];  Service: Endoscopy;  Laterality: N/A;  7:30am   HEMORRHOID SURGERY N/A 08/01/2017   Procedure: HEMORRHOIDECTOMY;  Surgeon: Aviva Signs, MD;  Location: [facility];  Service: General;  Laterality: N/A;   LUMBAR LAMINECTOMY/DECOMPRESSION MICRODISCECTOMY Right 02/04/2017   Procedure: Microdiscectomy - Lumbar four-five - right redo;  Surgeon: Kary Kos, MD;  Location: [place];  Service: Neurosurgery;  Laterality: Right;    Current Outpatient Medications  Medication Sig Dispense Refill   Certolizumab Pegol (CIMZIA [place]) Inject 400 mg into the skin every 30 (thirty) days.     cholecalciferol (VITAMIN D) 1000 units tablet Take 1 tablet (1,000 Units total) by mouth daily. BEGIN AFTER WEEKLY VIT D DOSING IS COMPLETE 30 tablet 11   cloNIDine (CATAPRES) 0.1 MG tablet Take 1 tablet (0.1 mg total) by mouth 2 (two) times daily. 60 tablet 11   Multiple Vitamin (MULTIVITAMIN WITH MINERALS) TABS tablet Take 1 tablet by mouth daily. Doesn't take everyday     ondansetron (ZOFRAN ODT) 4 MG disintegrating tablet Take 1 tablet (4 mg total) by mouth every 8 (eight) hours as needed for nausea or vomiting. 30 tablet 1   dicyclomine (BENTYL) 20 MG tablet Take 1 tablet (20 mg total) by mouth 2 (two)  times daily as needed for spasms. (Patient not taking: Reported on 03/20/2020) 20 tablet 0   HYDROcodone-acetaminophen (NORCO) 5-325 MG tablet Take 1 tablet by mouth every 4 (four) hours as needed for moderate pain. (Patient not taking: Reported on 03/20/2020) 40 tablet 0   No current facility-administered medications for this visit.    Allergies as of 03/20/2020 - Review Complete 01/23/2020  Allergen Reaction Noted   Penicillins Swelling and Rash 08/15/2008   Azathioprine Nausea And Vomiting 11/25/2009   Iodinated diagnostic agents Nausea And Vomiting 08/20/2013    Metrizamide Nausea And Vomiting 08/20/2013   Pentasa [mesalamine er] Rash 07/11/2013    Family History  Problem Relation Age of Onset   Hypertension Mother    Hypertension Father    Colon cancer Neg Hx    Colon polyps Neg Hx    Inflammatory bowel disease Neg Hx     Social History   Socioeconomic History   Marital status: Single    Spouse name: Not on file   Number of children: Not on file   Years of education: Not on file   Highest education level: Not on file  Occupational History   Occupation: unemployed  Tobacco Use   Smoking status: Current Every Day Smoker    Packs/day: 0.25    Types: Cigarettes   Smokeless tobacco: Never Used   Tobacco comment: 6 cig daily as of 07/29/2017  Vaping Use   Vaping Use: Never used  Substance and Sexual Activity   Alcohol use: No    Alcohol/week: 0.0 standard drinks    Comment: very rare   Drug use: Yes    Types: Marijuana    Comment: 2-3 times daily Helps appetite and stomach.   Sexual activity: Yes    Birth control/protection: None  Other Topics Concern   Not on file  Social History Narrative   Smokes cigs  & pot. Has one girl AGE 71: DESTINY.   Social Determinants of Health   Financial Resource Strain:    Difficulty of Paying Living Expenses: Not on file  Food Insecurity:    Worried About Charity fundraiser in the Last Year: Not on file   [employer] of Food in the Last Year: Not on file  Transportation Needs:    Lack of Transportation (Medical): Not on file   Lack of Transportation (Non-Medical): Not on file  Physical Activity:    Days of Exercise per Week: Not on file   Minutes of Exercise per Session: Not on file  Stress:    Feeling of Stress : Not on file  Social Connections:    Frequency of Communication with Friends and Family: Not on file   Frequency of Social Gatherings with Friends and Family: Not on file   Attends Religious Services: Not on file   Active Member of Clubs or  Organizations: Not on file   Attends Archivist Meetings: Not on file   Marital Status: Not on file    Subjective: Review of Systems  Constitutional: Negative for chills and fever.  HENT: Negative for congestion and hearing loss.   Eyes: Negative for blurred vision and double vision.  Respiratory: Negative for cough and shortness of breath.   Cardiovascular: Negative for chest pain and palpitations.  Gastrointestinal: Negative for abdominal pain, blood in stool, constipation, diarrhea, heartburn, melena and vomiting.  Genitourinary: Negative for dysuria and urgency.  Musculoskeletal: Negative for joint pain and myalgias.  Skin: Negative for itching and rash.  Neurological: Negative for dizziness  and headaches.  Psychiatric/Behavioral: Negative for depression. The patient is not nervous/anxious.      Objective: BP (!) 171/102    Pulse 79    Temp (!) 97.1 F (36.2 C) (Oral)    Ht 5' 9.5" (1.765 m)    Wt 142 lb 6.4 oz (64.6 kg)    BMI 20.73 kg/m  Physical Exam Constitutional:      Appearance: Normal appearance.  HENT:     Head: Normocephalic and atraumatic.  Eyes:     Extraocular Movements: Extraocular movements intact.     Conjunctiva/sclera: Conjunctivae normal.  Cardiovascular:     Rate and Rhythm: Normal rate and regular rhythm.  Pulmonary:     Effort: Pulmonary effort is normal.     Breath sounds: Normal breath sounds.  Abdominal:     General: Bowel sounds are normal.     Palpations: Abdomen is soft.  Musculoskeletal:        General: Normal range of motion.     Cervical back: Normal range of motion and neck supple.  Skin:    General: Skin is warm.  Neurological:     General: No focal deficit present.     Mental Status: He is alert and oriented to person, place, and time.  Psychiatric:        Mood and Affect: Mood normal.        Behavior: Behavior normal.      Assessment: *Crohn's disease *Sacroiliitis likely due to above *Hepatic lesion-MRI  2018  Plan: In regards to patient's Crohn disease, from a clinical standpoint he seems to be doing okay.  He is due for surveillance colonoscopy which we will arrange today to evaluate for disease activity.  Continue on Cimzia.  He had evidence of sacroiliitis on his CT imaging which is likely an extra gastrointestinal manifestation of patient's Crohn's disease.  Patient denies any pain for me today.  We'll continue to follow this.  Unclear if his liver lesion was ever followed up on.  We will discuss repeat MRI with contrast on follow-up visit after his colonoscopy.  03/20/2020 4:17 PM   Disclaimer: This note was dictated with voice recognition software. Similar sounding words can inadvertently be transcribed and may not be corrected upon review.

## 2020-04-15 DIAGNOSIS — K50111 Crohn's disease of large intestine with rectal bleeding: Secondary | ICD-10-CM | POA: Diagnosis not present

## 2020-04-17 ENCOUNTER — Other Ambulatory Visit: Payer: Self-pay

## 2020-04-17 ENCOUNTER — Other Ambulatory Visit (HOSPITAL_COMMUNITY)
Admission: RE | Admit: 2020-04-17 | Discharge: 2020-04-17 | Disposition: A | Discharge status: Home / Self Care | Payer: Medicare Other | Source: Ambulatory Visit | Attending: Internal Medicine | Admitting: Internal Medicine

## 2020-04-17 DIAGNOSIS — Z20822 Contact with and (suspected) exposure to covid-19: Secondary | ICD-10-CM | POA: Diagnosis not present

## 2020-04-17 DIAGNOSIS — Z01818 Encounter for other preprocedural examination: Secondary | ICD-10-CM | POA: Diagnosis not present

## 2020-04-17 LAB — SARS CORONAVIRUS 2 (TAT 6-24 HRS): SARS Coronavirus 2: NEGATIVE

## 2020-04-18 ENCOUNTER — Ambulatory Visit (HOSPITAL_COMMUNITY): Payer: Medicare Other | Admitting: Certified Registered"

## 2020-04-18 ENCOUNTER — Other Ambulatory Visit: Payer: Self-pay

## 2020-04-18 ENCOUNTER — Ambulatory Visit (HOSPITAL_COMMUNITY)
Admission: RE | Admit: 2020-04-18 | Discharge: 2020-04-18 | Disposition: A | Discharge status: Home / Self Care | Payer: Medicare Other | Source: Ambulatory Visit | Attending: Internal Medicine | Admitting: Internal Medicine

## 2020-04-18 ENCOUNTER — Encounter (HOSPITAL_COMMUNITY)
Admission: RE | Disposition: A | Discharge status: Home / Self Care | Payer: Self-pay | Source: Ambulatory Visit | Attending: Internal Medicine

## 2020-04-18 ENCOUNTER — Encounter (HOSPITAL_COMMUNITY): Payer: Self-pay

## 2020-04-18 DIAGNOSIS — K5000 Crohn's disease of small intestine without complications: Secondary | ICD-10-CM | POA: Insufficient documentation

## 2020-04-18 DIAGNOSIS — I10 Essential (primary) hypertension: Secondary | ICD-10-CM | POA: Diagnosis not present

## 2020-04-18 DIAGNOSIS — Z880 Allergy status to penicillin: Secondary | ICD-10-CM | POA: Insufficient documentation

## 2020-04-18 DIAGNOSIS — Z980 Intestinal bypass and anastomosis status: Secondary | ICD-10-CM | POA: Diagnosis not present

## 2020-04-18 DIAGNOSIS — Z8249 Family history of ischemic heart disease and other diseases of the circulatory system: Secondary | ICD-10-CM | POA: Diagnosis not present

## 2020-04-18 DIAGNOSIS — Z79899 Other long term (current) drug therapy: Secondary | ICD-10-CM | POA: Insufficient documentation

## 2020-04-18 DIAGNOSIS — Z91041 Radiographic dye allergy status: Secondary | ICD-10-CM | POA: Insufficient documentation

## 2020-04-18 DIAGNOSIS — D573 Sickle-cell trait: Secondary | ICD-10-CM | POA: Diagnosis not present

## 2020-04-18 DIAGNOSIS — K648 Other hemorrhoids: Secondary | ICD-10-CM | POA: Diagnosis not present

## 2020-04-18 DIAGNOSIS — K633 Ulcer of intestine: Secondary | ICD-10-CM | POA: Insufficient documentation

## 2020-04-18 DIAGNOSIS — F17210 Nicotine dependence, cigarettes, uncomplicated: Secondary | ICD-10-CM | POA: Insufficient documentation

## 2020-04-18 DIAGNOSIS — Z888 Allergy status to other drugs, medicaments and biological substances status: Secondary | ICD-10-CM | POA: Diagnosis not present

## 2020-04-18 HISTORY — PX: BIOPSY: SHX5522

## 2020-04-18 HISTORY — PX: COLONOSCOPY WITH PROPOFOL: SHX5780

## 2020-04-18 SURGERY — COLONOSCOPY WITH PROPOFOL
Anesthesia: General

## 2020-04-18 MED ORDER — MIDAZOLAM HCL 2 MG/2ML IJ SOLN
INTRAMUSCULAR | Status: DC | PRN
  Administered 2020-04-18: 2 mg via INTRAVENOUS

## 2020-04-18 MED ORDER — LIDOCAINE HCL (CARDIAC) PF 100 MG/5ML IV SOSY
PREFILLED_SYRINGE | INTRAVENOUS | Status: DC | PRN
  Administered 2020-04-18: 60 mg via INTRAVENOUS

## 2020-04-18 MED ORDER — LACTATED RINGERS IV SOLN: INTRAVENOUS | Status: DC

## 2020-04-18 MED ORDER — LACTATED RINGERS IV SOLN: INTRAVENOUS | Status: DC | PRN

## 2020-04-18 MED ORDER — MIDAZOLAM HCL 2 MG/2ML IJ SOLN
INTRAMUSCULAR | Status: AC
  Filled 2020-04-18: qty 2

## 2020-04-18 MED ORDER — PROPOFOL 10 MG/ML IV BOLUS
INTRAVENOUS | Status: DC | PRN
  Administered 2020-04-18: 100 mg via INTRAVENOUS

## 2020-04-18 MED ORDER — PROPOFOL 500 MG/50ML IV EMUL
INTRAVENOUS | Status: DC | PRN
  Administered 2020-04-18: 150 ug/kg/min via INTRAVENOUS

## 2020-04-18 MED ORDER — CHLORHEXIDINE GLUCONATE CLOTH 2 % EX PADS: 6.0000 | MEDICATED_PAD | Freq: Once | CUTANEOUS | Status: DC

## 2020-04-18 MED ORDER — STERILE WATER FOR IRRIGATION IR SOLN
Status: DC | PRN
  Administered 2020-04-18: 100 mL

## 2020-04-18 NOTE — Discharge Instructions (Addendum)
  Colonoscopy Discharge Instructions  Read the instructions outlined below and refer to this sheet in the next few weeks. These discharge instructions provide you with general information on caring for yourself after you leave the hospital. Your doctor may also give you specific instructions. While your treatment has been planned according to the most current medical practices available, unavoidable complications occasionally occur.   ACTIVITY  You may resume your regular activity, but move at a slower pace for the next 24 hours.   Take frequent rest periods for the next 24 hours.   Walking will help get rid of the air and reduce the bloated feeling in your belly (abdomen).   No driving for 24 hours (because of the medicine (anesthesia) used during the test).    Do not sign any important legal documents or operate any machinery for 24 hours (because of the anesthesia used during the test).  NUTRITION  Drink plenty of fluids.   You may resume your normal diet as instructed by your doctor.   Begin with a light meal and progress to your normal diet. Heavy or fried foods are harder to digest and may make you feel sick to your stomach (nauseated).   Avoid alcoholic beverages for 24 hours or as instructed.  MEDICATIONS  You may resume your normal medications unless your doctor tells you otherwise.  WHAT YOU CAN EXPECT TODAY  Some feelings of bloating in the abdomen.   Passage of more gas than usual.   Spotting of blood in your stool or on the toilet paper.  IF YOU HAD POLYPS REMOVED DURING THE COLONOSCOPY:  No aspirin products for 7 days or as instructed.   No alcohol for 7 days or as instructed.   Eat a soft diet for the next 24 hours.  FINDING OUT THE RESULTS OF YOUR TEST Not all test results are available during your visit. If your test results are not back during the visit, make an appointment with your caregiver to find out the results. Do not assume everything is normal if  you have not heard from your caregiver or the medical facility. It is important for you to follow up on all of your test results.  SEEK IMMEDIATE MEDICAL ATTENTION IF:  You have more than a spotting of blood in your stool.   Your belly is swollen (abdominal distention).   You are nauseated or vomiting.   You have a temperature over 101.   You have abdominal pain or discomfort that is severe or gets worse throughout the day.   Your colonoscopy showed inflammation at the anastomosis point where your small bowel is connected to your colon.  I took biopsies of this and we should have results back by next week.  Follow-up with GI in 3 to 4 weeks to go over results and decide what we need to do next.  For now continue on Cimzia.  I hope you have a great rest of your week!  Elon Alas. Abbey Chatters, D.O. Gastroenterology and Hepatology [facility]

## 2020-04-18 NOTE — Transfer of Care (Signed)
Immediate Anesthesia Transfer of Care Note  Patient: Joshua Robertson  Procedure(s) Performed: COLONOSCOPY WITH PROPOFOL (N/A ) BIOPSY  Patient Location: Endoscopy Unit  Anesthesia Type:General  Level of Consciousness: drowsy  Airway & Oxygen Therapy: Patient Spontanous Breathing  Post-op Assessment: Report given to RN and Post -op Vital signs reviewed and stable  Post vital signs: Reviewed and stable  Last Vitals:  Vitals Value Taken Time  BP    Temp    Pulse    Resp    SpO2      Last Pain:  Vitals:   04/18/20 0731  TempSrc:   PainSc: 0-No pain      Patients Stated Pain Goal: 8 (69/86/14 8307)  Complications: No complications documented.

## 2020-04-18 NOTE — Interval H&P Note (Signed)
History and Physical Interval Note:  04/18/2020 7:20 AM  Joshua Robertson  has presented today for surgery, with the diagnosis of crohn's.  The various methods of treatment have been discussed with the patient and family. After consideration of risks, benefits and other options for treatment, the patient has consented to  Procedure(s) with comments: COLONOSCOPY WITH PROPOFOL (N/A) - 7:30am as a surgical intervention.  The patient's history has been reviewed, patient examined, no change in status, stable for surgery.  I have reviewed the patient's chart and labs.  Questions were answered to the patient's satisfaction.     Eloise Harman

## 2020-04-18 NOTE — Anesthesia Preprocedure Evaluation (Signed)
Anesthesia Evaluation  Patient identified by MRN, date of birth, ID band Patient awake    Reviewed: Allergy & Precautions, H&P , NPO status , Patient's Chart, lab work & pertinent test results, reviewed documented beta blocker date and time   Airway Mallampati: II  TM Distance: >3 FB Neck ROM: full    Dental no notable dental hx.    Pulmonary neg pulmonary ROS, Current Smoker,    Pulmonary exam normal breath sounds clear to auscultation       Cardiovascular Exercise Tolerance: Good hypertension, negative cardio ROS   Rhythm:regular Rate:Normal     Neuro/Psych  Headaches, negative psych ROS   GI/Hepatic negative GI ROS, Neg liver ROS,   Endo/Other  negative endocrine ROS  Renal/GU negative Renal ROS  negative genitourinary   Musculoskeletal   Abdominal   Peds  Hematology  (+) Blood dyscrasia, anemia ,   Anesthesia Other Findings   Reproductive/Obstetrics negative OB ROS                             Anesthesia Physical Anesthesia Plan  ASA: II  Anesthesia Plan: General   Post-op Pain Management:    Induction:   PONV Risk Score and Plan: Propofol infusion  Airway Management Planned:   Additional Equipment:   Intra-op Plan:   Post-operative Plan:   Informed Consent: I have reviewed the patients History and Physical, chart, labs and discussed the procedure including the risks, benefits and alternatives for the proposed anesthesia with the patient or authorized representative who has indicated his/her understanding and acceptance.     Dental Advisory Given  Plan Discussed with: CRNA  Anesthesia Plan Comments:         Anesthesia Quick Evaluation

## 2020-04-18 NOTE — Anesthesia Procedure Notes (Signed)
Date/Time: 04/18/2020 7:36 AM Performed by: Orlie Dakin, CRNA Pre-anesthesia Checklist: Patient identified, Emergency Drugs available, Suction available and Patient being monitored Patient Re-evaluated:Patient Re-evaluated prior to induction Oxygen Delivery Method: Nasal cannula Induction Type: IV induction Placement Confirmation: positive ETCO2

## 2020-04-18 NOTE — Anesthesia Postprocedure Evaluation (Signed)
Anesthesia Post Note  Patient: Naod Sweetland  Procedure(s) Performed: COLONOSCOPY WITH PROPOFOL (N/A ) BIOPSY  Patient location during evaluation: Endoscopy Anesthesia Type: General Level of consciousness: awake and alert and oriented Pain management: pain level controlled Vital Signs Assessment: post-procedure vital signs reviewed and stable Respiratory status: spontaneous breathing, nonlabored ventilation and respiratory function stable Cardiovascular status: blood pressure returned to baseline and stable Postop Assessment: no apparent nausea or vomiting Anesthetic complications: no   No complications documented.   Last Vitals:  Vitals:   04/18/20 0635 04/18/20 0748  BP: (!) 148/99 (!) 146/98  Pulse: 64   Resp: 17 18  Temp: 36.6 C 36.4 C  SpO2: 100% 100%    Last Pain:  Vitals:   04/18/20 0748  TempSrc: Axillary  PainSc:                  Orlie Dakin

## 2020-04-18 NOTE — Op Note (Signed)
[facility] Patient Name: Joshua Robertson Procedure Date: 04/18/2020 7:02 AM MRN: [id number] Date of Birth: [birth year] Attending MD: Elon Alas. Abbey Chatters DO CSN: [id number] Age: 44 Admit Type: Outpatient Procedure:                Colonoscopy Indications:              Disease activity assessment of Crohn's disease of                            the small bowel Providers:                Elon Alas. Abbey Chatters, DO, Janeece Riggers, RN, Caprice Kluver,                            Aram Candela Referring MD:              Medicines:                See the Anesthesia note for documentation of the                            administered medications Complications:            No immediate complications. Estimated Blood Loss:     Estimated blood loss was minimal. Procedure:                Pre-Anesthesia Assessment:                           - The anesthesia plan was to use monitored                            anesthesia care (MAC).                           After obtaining informed consent, the colonoscope                            was passed under direct vision. Throughout the                            procedure, the patient's blood pressure, pulse, and                            oxygen saturations were monitored continuously. The                            [id number]) was introduced through the anus                            and advanced to the the ileocolonic anastomosis.                            The colonoscopy was performed without difficulty.                            The patient tolerated the procedure well. The  quality of the bowel preparation was evaluated                            using the BBPS [facility] Bowel Preparation Scale)                            with scores of: Right Colon = 2 (minor amount of                            residual staining, small fragments of stool and/or                            opaque liquid, but mucosa seen well), Transverse                             Colon = 2 (minor amount of residual staining, small                            fragments of stool and/or opaque liquid, but mucosa                            seen well) and Left Colon = 2 (minor amount of                            residual staining, small fragments of stool and/or                            opaque liquid, but mucosa seen well). The total                            BBPS score equals 6. The quality of the bowel                            preparation was fair. Scope In: 7:33:09 AM Scope Out: 7:45:12 AM Scope Withdrawal Time: 0 hours 8 minutes 44 seconds  Total Procedure Duration: 0 hours 12 minutes 3 seconds  Findings:      The perianal and digital rectal examinations were normal.      Non-bleeding internal hemorrhoids were found during endoscopy.      There was evidence of a prior end-to-side ileo-colonic anastomosis in       the cecum. This was patent and was characterized by erosion, erythema,       inflammation and ulceration. Biopsies were taken with a cold forceps for       histology.      A moderate amount of stool was found in the entire colon, making       visualization difficult. Lavage of the area was performed using a large       amount, resulting in clearance with fair visualization. Impression:               - Preparation of the colon was fair.                           - Non-bleeding internal hemorrhoids.                           -  Patent end-to-side ileo-colonic anastomosis,                            characterized by erosion, erythema, inflammation                            and ulceration. Biopsied.                           - Stool in the entire examined colon. Moderate Sedation:      Per Anesthesia Care Recommendation:           - Patient has a contact number available for                            emergencies. The signs and symptoms of potential                            delayed complications were discussed with the                             patient. Return to normal activities tomorrow.                            Written discharge instructions were provided to the                            patient.                           - Resume previous diet.                           - Continue present medications. Continue Cimzia.                           - Await pathology results.                           - Repeat colonoscopy at appointment to be scheduled                            for surveillance based on pathology results.                           - Return to GI clinic in 4 weeks. Procedure Code(s):        --- Professional ---                           [phone], Colonoscopy, flexible; with biopsy, single                            or multiple Diagnosis Code(s):        --- Professional ---                           K64.8, Other hemorrhoids  Z98.0, Intestinal bypass and anastomosis status                           K50.00, Crohn's disease of small intestine without                            complications CPT copyright 2019 American Medical Association. All rights reserved. The codes documented in this report are preliminary and upon coder review may  be revised to meet current compliance requirements. Elon Alas. Abbey Chatters, DO [place] Abbey Chatters, DO 04/18/2020 7:52:21 AM This report has been signed electronically. Number of Addenda: 0

## 2020-04-23 LAB — SURGICAL PATHOLOGY

## 2020-04-24 ENCOUNTER — Encounter (HOSPITAL_COMMUNITY): Payer: Self-pay | Admitting: Internal Medicine

## 2020-05-13 DIAGNOSIS — K50111 Crohn's disease of large intestine with rectal bleeding: Secondary | ICD-10-CM | POA: Diagnosis not present

## 2020-05-28 ENCOUNTER — Ambulatory Visit (INDEPENDENT_AMBULATORY_CARE_PROVIDER_SITE_OTHER): Payer: Medicare Other | Admitting: Internal Medicine

## 2020-05-28 ENCOUNTER — Encounter: Payer: Self-pay | Admitting: Internal Medicine

## 2020-05-28 ENCOUNTER — Other Ambulatory Visit: Payer: Self-pay

## 2020-05-28 VITALS — BP 146/96 | HR 77 | Temp 97.3°F | Ht 70.0 in | Wt 144.4 lb

## 2020-05-28 DIAGNOSIS — K769 Liver disease, unspecified: Secondary | ICD-10-CM | POA: Diagnosis not present

## 2020-05-28 DIAGNOSIS — K50812 Crohn's disease of both small and large intestine with intestinal obstruction: Secondary | ICD-10-CM

## 2020-05-28 MED ORDER — ONDANSETRON 4 MG PO TBDP
4.0000 mg | ORAL_TABLET | Freq: Three times a day (TID) | ORAL | 5 refills | Status: DC | PRN
Start: 2020-05-28 — End: 2020-09-11

## 2020-05-28 NOTE — Patient Instructions (Signed)
We will schedule you for MRI to evaluate the lesion seen on your liver.  I am Going to check blood work in anticipation of potentially starting azathioprine to help with your Crohn's disease.  Please Discuss your bilateral sacroiliitis with your rheumatologist.  This may be a consequence of your Crohn's disease.  They may have recommendations to improve this.  Follow-up with me in 4 to 6 weeks.  At [facility] we value your feedback. You may receive a survey about your visit today. Please share your experience as we strive to create trusting relationships with our patients to provide genuine, compassionate, quality care.  We appreciate your understanding and patience as we review any laboratory studies, imaging, and other diagnostic tests that are ordered as we care for you. Our office policy is 5 business days for review of these results, and any emergent or urgent results are addressed in a timely manner for your best interest. If you do not hear from our office in 1 week, please contact us.   We also encourage the use of MyChart, which contains your medical information for your review as well. If you are not enrolled in this feature, an access code is on this after visit summary for your convenience. Thank you for allowing [place] to be involved in your care.  It was great to see you today!  I hope you have a great rest of your winter!!    Elon Alas. Abbey Chatters, D.O. Gastroenterology and Hepatology [facility]

## 2020-05-29 ENCOUNTER — Telehealth: Payer: Self-pay

## 2020-05-29 NOTE — Telephone Encounter (Signed)
Pt wanted to go to GI for MRI Liver for larger machine. Called GI and informed scheduler of order. They will call pt to schedule appt.  No PA needed for MRI per [facility] website.

## 2020-06-01 NOTE — Progress Notes (Signed)
Referring Provider: Caryl Bis, MD Primary Care Physician:  Caryl Bis, MD Primary GI:  Dr. Abbey Chatters  Chief Complaint  Patient presents with  . Crohn's Disease    Pp f/u, doing ok. Needs Zofran refill    HPI:   Joshua Robertson is a 44 y.o. male who presents to the clinic today for follow-up visit.  He has a history of ileocolonic Crohn's disease with obstruction status post resection.  Currently maintained on Cimzia and doing okay.  States he has on average 2-3 bowel movements daily occasionally these are loose depending on what he eats.  No melena or hematochezia.  No mucus in his stool.  Denies any weight loss.  He had a CT abdomen pelvis without contrast performed 01/21/2020 with findings suspicious for active Crohn's disease as well as bilateral SI joint disease likely Crohn's related sacroiliitis.    Appears he had an MRI back in 2018 that showed a subtle lesion in the right hepatic lobe with nonspecific characteristics with recommended follow-up MRI in 6 months.  I'm unsure if this was ever followed up on.  Recent colonoscopy showed ulceration at the ileocolonic anastomosis.  Biopsies positive for active Crohn's disease.    Regards to patient's Crohn's treatment he states he has tried Remicade in the past which initially helped immensely but stopped working after few years.  Humira did not work at all.  States he was on Stelara at one point as well but had to stop this due to what he states is hepatotoxicity though I am unsure about this.  Patient does note chronic low back pain.  Also notes stiffness when trying to bend over.  Past Medical History:  Diagnosis Date  . BMI between 19-24,adult JAN 2010 149 LBS  . Crohn's disease ([place]) AUG 2009 NUR [place]   HB 9.4 FERRITIN 26  140 LBS; no serologies-pt can't afford test  . Helicobacter pylori gastritis 2009  . Hypertension   . Migraine headache    NONE SINCE AGE 89  . Sacroiliitis ([place]) 2009  . Sickle cell trait ([place])   .  Spondyloarthropathy 2009 EROSIVE    Past Surgical History:  Procedure Laterality Date  . BACK SURGERY     2018  outpatient x2  . BIOPSY  09/06/2017   Procedure: BIOPSY;  Surgeon: [name], MD;  [facility];  Service: Endoscopy;;  colon ileum  . BIOPSY  04/18/2020   Procedure: BIOPSY;  Surgeon: Eloise Harman, DO;  [facility];  Service: Endoscopy;;  . COLON RESECTION     due to chrohn's  . COLONOSCOPY  03/2015   [facility]: Crohn's like stricture at ICV. Stricture was not traversable. abnormal mucosa of ICV. Bx, c/w Crohn's.   . COLONOSCOPY N/A 07/30/2013   stenotic IC VALVE. UNABLE TO INTUBATE ILEUM.  Marland Kitchen COLONOSCOPY WITH PROPOFOL N/A 09/06/2017   Procedure: COLONOSCOPY WITH PROPOFOL;  Surgeon: [name], MD;  [facility];  Service: Endoscopy;  Laterality: N/A;  7:30am  . COLONOSCOPY WITH PROPOFOL N/A 04/18/2020   Procedure: COLONOSCOPY WITH PROPOFOL;  Surgeon: Eloise Harman, DO;  [facility];  Service: Endoscopy;  Laterality: N/A;  7:30am  . HEMORRHOID SURGERY N/A 08/01/2017   Procedure: HEMORRHOIDECTOMY;  Surgeon: Aviva Signs, MD;  Location: [facility];  Service: General;  Laterality: N/A;  . LUMBAR LAMINECTOMY/DECOMPRESSION MICRODISCECTOMY Right 02/04/2017   Procedure: Microdiscectomy - Lumbar four-five - right redo;  Surgeon: Kary Kos, MD;  Location: [place] [facility];  Service: Neurosurgery;  Laterality: Right;    Current Outpatient Medications  Medication Sig Dispense Refill  . acetaminophen (TYLENOL) 500 MG tablet Take 500-1,000 mg by mouth every 6 (six) hours as needed (for pain).    . cholecalciferol (VITAMIN D) 1000 units tablet Take 1 tablet (1,000 Units total) by mouth daily. BEGIN AFTER WEEKLY VIT D DOSING IS COMPLETE (Patient taking differently: Take 1,000 Units by mouth 3 (three) times a week.) 30 tablet 11  . CIMZIA PREFILLED 2 X 200 MG/ML KIT Inject 400 mg into the skin every 30 (thirty) days.     . cloNIDine  (CATAPRES) 0.1 MG tablet Take 1 tablet (0.1 mg total) by mouth 2 (two) times daily. 60 tablet 11  . dicyclomine (BENTYL) 20 MG tablet Take 1 tablet (20 mg total) by mouth 2 (two) times daily as needed for spasms. 20 tablet 0  . Multiple Vitamin (MULTIVITAMIN WITH MINERALS) TABS tablet Take 1 tablet by mouth daily. One-A-Day for Men    . ondansetron (ZOFRAN ODT) 4 MG disintegrating tablet Take 1 tablet (4 mg total) by mouth every 8 (eight) hours as needed for nausea or vomiting. 30 tablet 5   No current facility-administered medications for this visit.    Allergies as of 05/28/2020 - Review Complete 05/28/2020  Allergen Reaction Noted  . Penicillins Swelling and Rash 08/15/2008  . Azathioprine Nausea And Vomiting 11/25/2009  . Iodinated diagnostic agents Nausea And Vomiting 08/20/2013  . Metrizamide Nausea And Vomiting 08/20/2013  . Pentasa [mesalamine er] Rash 07/11/2013    Family History  Problem Relation Age of Onset  . Hypertension Mother   . Hypertension Father   . Colon cancer Neg Hx   . Colon polyps Neg Hx   . Inflammatory bowel disease Neg Hx     Social History   Socioeconomic History  . Marital status: Single    Spouse name: Not on file  . Number of children: Not on file  . Years of education: Not on file  . Highest education level: Not on file  Occupational History  . Occupation: unemployed  Tobacco Use  . Smoking status: Current Every Day Smoker    Packs/day: 0.25    Types: Cigarettes  . Smokeless tobacco: Never Used  . Tobacco comment: 6 cig daily as of 07/29/2017  Vaping Use  . Vaping Use: Never used  Substance and Sexual Activity  . Alcohol use: Yes    Alcohol/week: 0.0 standard drinks    Comment: very rare  . Drug use: Yes    Types: Marijuana    Comment: 2-3 times daily Helps appetite and stomach.  . Sexual activity: Yes    Birth control/protection: None  Other Topics Concern  . Not on file  Social History Narrative   Smokes cigs  & pot. Has one  girl AGE 1: DESTINY.   Social Determinants of Health   Financial Resource Strain: Not on file  Food Insecurity: Not on file  Transportation Needs: Not on file  Physical Activity: Not on file  Stress: Not on file  Social Connections: Not on file    Subjective: Review of Systems  Constitutional: Negative for chills and fever.  HENT: Negative for congestion and hearing loss.   Eyes: Negative for blurred vision and double vision.  Respiratory: Negative for cough and shortness of breath.   Cardiovascular: Negative for chest pain and palpitations.  Gastrointestinal: Negative for abdominal pain, blood in stool, constipation, diarrhea, heartburn, melena and vomiting.  Genitourinary: Negative  for dysuria and urgency.  Musculoskeletal: Positive for back pain. Negative for joint pain and myalgias.  Skin: Negative for itching and rash.  Neurological: Negative for dizziness and headaches.  Psychiatric/Behavioral: Negative for depression. The patient is not nervous/anxious.      Objective: BP (!) 146/96   Pulse 77   Temp (!) 97.3 F (36.3 C) (Temporal)   Ht 5' 10"  (1.778 m)   Wt 144 lb 6.4 oz (65.5 kg)   BMI 20.72 kg/m  Physical Exam Constitutional:      Appearance: Normal appearance.  HENT:     Head: Normocephalic and atraumatic.  Eyes:     Extraocular Movements: Extraocular movements intact.     Conjunctiva/sclera: Conjunctivae normal.  Cardiovascular:     Rate and Rhythm: Normal rate and regular rhythm.  Pulmonary:     Effort: Pulmonary effort is normal.     Breath sounds: Normal breath sounds.  Abdominal:     General: Bowel sounds are normal.     Palpations: Abdomen is soft.  Musculoskeletal:        General: Normal range of motion.     Cervical back: Normal range of motion and neck supple.  Skin:    General: Skin is warm.  Neurological:     General: No focal deficit present.     Mental Status: He is alert and oriented to person, place, and time.  Psychiatric:         Mood and Affect: Mood normal.        Behavior: Behavior normal.      Assessment: *Ileocolonic Crohn's disease *Sacroiliitis likely due to above *Hepatic lesion-MRI 2018  Plan: In regards to patient's ileocolonic Crohn's disease, clinically he appears to be doing okay though endoscopically he has active ulceration at the ileocolonic anastomosis.  Discussed in depth with patient today that we need to change his chronic management.  He states that he feels much better on the Cimzia and would like to stay on this if possible, Discussed adding immune modulator such as azathioprine and he is interested in this.  I will check TPMT status today in clinic.  Can also consider sending to [facility] IBD clinic for further evaluation as well.    I will also order MRI liver given history of hepatic lesion in 2018.  I am unsure if this was ever followed up on.  Patient follow-up with me in 4 to 6 weeks or sooner if needed.  06/01/2020 3:50 PM   Disclaimer: This note was dictated with voice recognition software. Similar sounding words can inadvertently be transcribed and may not be corrected upon review.

## 2020-06-03 LAB — THIOPURINE METHYLTRANSFERASE (TPMT), RBC: Thiopurine Methyltransferase, RBC: 19 nmol/hr/mL RBC

## 2020-06-10 DIAGNOSIS — K50111 Crohn's disease of large intestine with rectal bleeding: Secondary | ICD-10-CM | POA: Diagnosis not present

## 2020-06-11 ENCOUNTER — Ambulatory Visit (HOSPITAL_COMMUNITY): Payer: Medicare Other

## 2020-06-18 DIAGNOSIS — I10 Essential (primary) hypertension: Secondary | ICD-10-CM | POA: Diagnosis not present

## 2020-06-18 DIAGNOSIS — K5000 Crohn's disease of small intestine without complications: Secondary | ICD-10-CM | POA: Diagnosis not present

## 2020-06-18 DIAGNOSIS — Z0001 Encounter for general adult medical examination with abnormal findings: Secondary | ICD-10-CM | POA: Diagnosis not present

## 2020-06-18 DIAGNOSIS — F17210 Nicotine dependence, cigarettes, uncomplicated: Secondary | ICD-10-CM | POA: Diagnosis not present

## 2020-06-25 ENCOUNTER — Ambulatory Visit
Admission: RE | Admit: 2020-06-25 | Discharge: 2020-06-25 | Disposition: A | Discharge status: Home / Self Care | Payer: Medicare Other | Source: Ambulatory Visit | Attending: Internal Medicine | Admitting: Internal Medicine

## 2020-06-25 ENCOUNTER — Other Ambulatory Visit: Payer: Self-pay

## 2020-06-25 DIAGNOSIS — D1809 Hemangioma of other sites: Secondary | ICD-10-CM | POA: Diagnosis not present

## 2020-06-25 DIAGNOSIS — K769 Liver disease, unspecified: Secondary | ICD-10-CM

## 2020-06-25 MED ORDER — GADOBENATE DIMEGLUMINE 529 MG/ML IV SOLN
15.0000 mL | Freq: Once | INTRAVENOUS | Status: AC | PRN
  Administered 2020-06-25: 15 mL via INTRAVENOUS

## 2020-06-26 ENCOUNTER — Telehealth: Payer: Self-pay

## 2020-06-26 NOTE — Telephone Encounter (Signed)
Dr. Abbey Chatters I got a call from [facility] this morning regarding this pt needing a new Rx for Cimzia Prefilled 2 x 227m/ml kit faxed to them at [phone] if you need to speak to someone you can reach them at [phone]   I checked this patient's chart and I [facility]????

## 2020-07-02 ENCOUNTER — Telehealth: Payer: Self-pay | Admitting: Internal Medicine

## 2020-07-02 MED ORDER — CIMZIA PREFILLED 2 X 200 MG/ML ~~LOC~~ KIT: 400.0000 mg | PACK | SUBCUTANEOUS | 3 refills | Status: DC

## 2020-07-02 NOTE — Telephone Encounter (Signed)
noted 

## 2020-07-02 NOTE — Telephone Encounter (Signed)
Almyra Free, I completed RX, hopefully printed at the office but will need to be faxed.

## 2020-07-02 NOTE — Telephone Encounter (Signed)
I spoke with the pt, asked him exactly how many syringes of Cimzia he gets a month. Pt stated he gets 2 a month. I advised him that both Randall Hiss and Dr.Carver are off today and I would have to send this to whomever is doing refills this week. Pt stated he is doing well on this and would appreciate refills sent to [facility].  Routing to [facility]- who is doing refills this week.

## 2020-07-02 NOTE — Addendum Note (Signed)
Addended by: Claudina Lick on: 07/02/2020 02:01 PM   Modules accepted: Orders

## 2020-07-02 NOTE — Telephone Encounter (Signed)
I received a call from [facility](Joshua Robertson) stating she needs a new Rx(Cimzia 200 mg prefilled syringe) faxed to her at [phone].  If you have questions please call at [phone].

## 2020-07-02 NOTE — Telephone Encounter (Signed)
Patient called asking about his medication being filled from [facility]

## 2020-07-02 NOTE — Telephone Encounter (Signed)
Returned the pt's called and was advised by him that he had received a call from [facility] this weekend and I advised the pt that a note was put back to Dr. Abbey Chatters on the 13th and I'm guessing it wasn't done. This particular medication is sent from [place].

## 2020-07-02 NOTE — Telephone Encounter (Signed)
Rx did not print, I have re-printed the rx and have faxed it to [facility].  Rx left on [place] desk in case it doesn't go through.

## 2020-07-02 NOTE — Addendum Note (Signed)
Addended by: Mahala Menghini on: 07/02/2020 10:55 AM   Modules accepted: Orders

## 2020-07-02 NOTE — Telephone Encounter (Signed)
RX printed. Needs to be faxed.   Forwarding to Dr. Abbey Chatters regarding unaddressed lab from 05/28/20. Appeared plans to start 72m at time of last ov.   FMardelle Matte patient has follow up next month with you. I could not find TB test anytime in last couple of years or more. Consider updating at time of OV.

## 2020-07-09 NOTE — Telephone Encounter (Signed)
Noted, thanks for the assistance while out. Updated appt notes to recheck TB

## 2020-07-11 DIAGNOSIS — K50111 Crohn's disease of large intestine with rectal bleeding: Secondary | ICD-10-CM | POA: Diagnosis not present

## 2020-07-29 ENCOUNTER — Ambulatory Visit: Payer: Medicare Other | Admitting: Nurse Practitioner

## 2020-07-29 ENCOUNTER — Encounter: Payer: Self-pay | Admitting: Internal Medicine

## 2020-07-29 NOTE — Progress Notes (Deleted)
Referring Provider: Caryl Bis, MD Primary Care Physician:  Caryl Bis, MD Primary GI:  Dr. Abbey Chatters  No chief complaint on file.   HPI:   Joshua Robertson is a 45 y.o. male who presents for follow-up on Crohn's disease.  The patient was last seen in our office 05/28/2020 for the same as well as liver disease.  Noted history of ileocolonic Crohn's disease with obstruction status post resection currently maintained on Cimzia.  Averages 2-3 bowel movements occasionally that are loose depending on dietary intake.  No bleeding or weight loss.  CT of the abdomen and pelvis with contrast in August 2021 with findings suspicious for active Crohn's disease and bilateral SI joint disease likely Crohn's related sacroiliitis.  MRI in 2018 with a subtle lesion in the right hepatic lobe with nonspecific characteristics and recommended follow-up MRI in 6 months that we are unsure if he completed.  Recent colonoscopy showed ulceration at ileocolonic anastomosis biopsies positive for active Crohn's disease.  Previously tried Remicade which initially helped but stopped, Humira did not work at all.  Was trialed on Stelara at some point but had to stop due to hepatotoxicity though unsure about the accuracy of this.  Discussed with the patient the need to change his chronic management but indicated he feels better on Cimzia and would like to stay on this if possible.  Discussed possibility of adding immune modulator such as azathioprine.  Recommended check TPMT status and consider referral to [facility] clinic.  Also recommended MRI of the liver for follow-up on previous MRI in twenty eighteen.  Follow-up in 4 to 6 weeks.  Lab work completed 05/29/2019 for TPMT which is found to be normal (19).   MRI completed 06/25/2020 which found subtle lesion from previous MRI in 2018 not readily apparent and likely transient phenomenon, small hemangioma in the right hepatic lobe and a tiny cyst in the left hepatic lobe  both of which appear benign.  Today he states he's doing ok overall.   ***UPDATE TB TEST***  Past Medical History:  Diagnosis Date  . BMI between 19-24,adult JAN 2010 149 LBS  . Crohn's disease ([place]) AUG 2009 NUR [place]   HB 9.4 FERRITIN 26  140 LBS; no serologies-pt can't afford test  . Helicobacter pylori gastritis 2009  . Hypertension   . Migraine headache    NONE SINCE AGE 53  . Sacroiliitis ([place]) 2009  . Sickle cell trait ([place])   . Spondyloarthropathy 2009 EROSIVE    Past Surgical History:  Procedure Laterality Date  . BACK SURGERY     2018  outpatient x2  . BIOPSY  09/06/2017   Procedure: BIOPSY;  Surgeon: [name], MD;  [facility];  Service: Endoscopy;;  colon ileum  . BIOPSY  04/18/2020   Procedure: BIOPSY;  Surgeon: Eloise Harman, DO;  [facility];  Service: Endoscopy;;  . COLON RESECTION     due to chrohn's  . COLONOSCOPY  03/2015   [facility]: Crohn's like stricture at ICV. Stricture was not traversable. abnormal mucosa of ICV. Bx, c/w Crohn's.   . COLONOSCOPY N/A 07/30/2013   stenotic IC VALVE. UNABLE TO INTUBATE ILEUM.  Marland Kitchen COLONOSCOPY WITH PROPOFOL N/A 09/06/2017   Procedure: COLONOSCOPY WITH PROPOFOL;  Surgeon: [name], MD;  [facility];  Service: Endoscopy;  Laterality: N/A;  7:30am  . COLONOSCOPY WITH PROPOFOL N/A 04/18/2020   Procedure: COLONOSCOPY WITH PROPOFOL;  Surgeon: Hurshel Keys  K, DO;  [facility];  Service: Endoscopy;  Laterality: N/A;  7:30am  . HEMORRHOID SURGERY N/A 08/01/2017   Procedure: HEMORRHOIDECTOMY;  Surgeon: Aviva Signs, MD;  Location[facility];  Service: General;  Laterality: N/A;  . LUMBAR LAMINECTOMY/DECOMPRESSION MICRODISCECTOMY Right 02/04/2017   Procedure: Microdiscectomy - Lumbar four-five - right redo;  Surgeon: Kary Kos, MD;  Location: [place];  Service: Neurosurgery;  Laterality: Right;    Current Outpatient Medications  Medication Sig Dispense Refill  .  acetaminophen (TYLENOL) 500 MG tablet Take 500-1,000 mg by mouth every 6 (six) hours as needed (for pain).    . cholecalciferol (VITAMIN D) 1000 units tablet Take 1 tablet (1,000 Units total) by mouth daily. BEGIN AFTER WEEKLY VIT D DOSING IS COMPLETE (Patient taking differently: Take 1,000 Units by mouth 3 (three) times a week.) 30 tablet 11  . CIMZIA PREFILLED 2 X 200 MG/ML KIT Inject 400 mg into the skin every 30 (thirty) days. 1 kit 3  . cloNIDine (CATAPRES) 0.1 MG tablet Take 1 tablet (0.1 mg total) by mouth 2 (two) times daily. 60 tablet 11  . dicyclomine (BENTYL) 20 MG tablet Take 1 tablet (20 mg total) by mouth 2 (two) times daily as needed for spasms. 20 tablet 0  . Multiple Vitamin (MULTIVITAMIN WITH MINERALS) TABS tablet Take 1 tablet by mouth daily. One-A-Day for Men    . ondansetron (ZOFRAN ODT) 4 MG disintegrating tablet Take 1 tablet (4 mg total) by mouth every 8 (eight) hours as needed for nausea or vomiting. 30 tablet 5   No current facility-administered medications for this visit.    Allergies as of 07/29/2020 - Review Complete 05/28/2020  Allergen Reaction Noted  . Penicillins Swelling and Rash 08/15/2008  . Azathioprine Nausea And Vomiting 11/25/2009  . Iodinated diagnostic agents Nausea And Vomiting 08/20/2013  . Metrizamide Nausea And Vomiting 08/20/2013  . Pentasa [mesalamine er] Rash 07/11/2013    Family History  Problem Relation Age of Onset  . Hypertension Mother   . Hypertension Father   . Colon cancer Neg Hx   . Colon polyps Neg Hx   . Inflammatory bowel disease Neg Hx     Social History   Socioeconomic History  . Marital status: Single    Spouse name: Not on file  . Number of children: Not on file  . Years of education: Not on file  . Highest education level: Not on file  Occupational History  . Occupation: unemployed  Tobacco Use  . Smoking status: Current Every Day Smoker    Packs/day: 0.25    Types: Cigarettes  . Smokeless tobacco: Never Used   . Tobacco comment: 6 cig daily as of 07/29/2017  Vaping Use  . Vaping Use: Never used  Substance and Sexual Activity  . Alcohol use: Yes    Alcohol/week: 0.0 standard drinks    Comment: very rare  . Drug use: Yes    Types: Marijuana    Comment: 2-3 times daily Helps appetite and stomach.  . Sexual activity: Yes    Birth control/protection: None  Other Topics Concern  . Not on file  Social History Narrative   Smokes cigs  & pot. Has one girl AGE 47: DESTINY.   Social Determinants of Health   Financial Resource Strain: Not on file  Food Insecurity: Not on file  Transportation Needs: Not on file  Physical Activity: Not on file  Stress: Not on file  Social Connections: Not on file    Subjective:*** Review  of Systems  Constitutional: Negative for chills, fever, malaise/fatigue and weight loss.  HENT: Negative for congestion and sore throat.   Respiratory: Negative for cough and shortness of breath.   Cardiovascular: Negative for chest pain and palpitations.  Gastrointestinal: Negative for abdominal pain, blood in stool, diarrhea, melena, nausea and vomiting.  Musculoskeletal: Negative for joint pain and myalgias.  Skin: Negative for rash.  Neurological: Negative for dizziness and weakness.  Endo/Heme/Allergies: Does not bruise/bleed easily.  Psychiatric/Behavioral: Negative for depression. The patient is not nervous/anxious.   All other systems reviewed and are negative.    Objective: There were no vitals taken for this visit. Physical Exam Vitals and nursing note reviewed.  Constitutional:      General: He is not in acute distress.    Appearance: Normal appearance. He is not ill-appearing, toxic-appearing or diaphoretic.  HENT:     Head: Normocephalic and atraumatic.     Nose: No congestion or rhinorrhea.  Eyes:     General: No scleral icterus. Cardiovascular:     Rate and Rhythm: Normal rate and regular rhythm.     Heart sounds: Normal heart sounds.  Pulmonary:      Effort: Pulmonary effort is normal.     Breath sounds: Normal breath sounds.  Abdominal:     General: Bowel sounds are normal. There is no distension.     Palpations: Abdomen is soft. There is no hepatomegaly, splenomegaly or mass.     Tenderness: There is no abdominal tenderness. There is no guarding or rebound.     Hernia: No hernia is present.  Musculoskeletal:     Cervical back: Neck supple.  Skin:    General: Skin is warm and dry.     Coloration: Skin is not jaundiced.     Findings: No bruising or rash.  Neurological:     General: No focal deficit present.     Mental Status: He is alert and oriented to person, place, and time. Mental status is at baseline.  Psychiatric:        Mood and Affect: Mood normal.        Behavior: Behavior normal.        Thought Content: Thought content normal.      Assessment:  ***   Plan: ***    Thank you for allowing [place] to participate in the care of Alba Cory, DNP, AGNP-C Adult & Gerontological Nurse Practitioner [facility]   07/29/2020 7:54 AM   Disclaimer: This note was dictated with voice recognition software. Similar sounding words can inadvertently be transcribed and may not be corrected upon review.

## 2020-08-05 DIAGNOSIS — K50111 Crohn's disease of large intestine with rectal bleeding: Secondary | ICD-10-CM | POA: Diagnosis not present

## 2020-08-12 ENCOUNTER — Encounter: Payer: Self-pay | Admitting: Gastroenterology

## 2020-08-12 ENCOUNTER — Ambulatory Visit (INDEPENDENT_AMBULATORY_CARE_PROVIDER_SITE_OTHER): Payer: Medicare Other | Admitting: Gastroenterology

## 2020-08-12 ENCOUNTER — Other Ambulatory Visit: Payer: Self-pay

## 2020-08-12 VITALS — BP 145/98 | HR 84 | Temp 97.0°F | Ht 70.0 in | Wt 144.0 lb

## 2020-08-12 DIAGNOSIS — D649 Anemia, unspecified: Secondary | ICD-10-CM | POA: Diagnosis not present

## 2020-08-12 DIAGNOSIS — K5900 Constipation, unspecified: Secondary | ICD-10-CM

## 2020-08-12 DIAGNOSIS — K50812 Crohn's disease of both small and large intestine with intestinal obstruction: Secondary | ICD-10-CM | POA: Diagnosis not present

## 2020-08-12 NOTE — Progress Notes (Signed)
Primary Care Physician: Caryl Bis, MD  Primary Gastroenterologist:  Elon Alas. Abbey Chatters, DO   Chief Complaint  Patient presents with  . Crohn's Disease    F/u. Doing okay  . Abdominal Pain    Comes/goes, lower abd    HPI: Joshua Robertson is a 45 y.o. male here for follow-up of ileocolonic Crohn's disease.  Last seen December 2021.  He has a history of obstruction status post resection.  Currently maintained on Cimzia.  He had a CT abdomen pelvis without contrast performed 01/21/2020 with findings suspicious for active Crohn's disease as well as bilateral SI joint disease likely Crohn's related sacroiliitis.   Labs from August 2021: Hemoglobin 11.2, hematocrit 35.2, MCV 77.2, CRP 14.3, sed rate 2.  Colonoscopy November 2021 with inflammation of the anastomosis.  Regards to patient's Crohn's treatment he states he has tried Remicade in the past which initially helped immensely but stopped working after few years.  Humira did not work at all.  States he was on Stelara at one point as well but had to stop this due to what he states is hepatotoxicity though I am unsure about this.  Recent Thiopurine methyltransferase, 19 (normal).  Today: Two to three days out of week will have some abdominal pain but hasn't had any severe pain in couple of months. Nothing like before surgery. BM last week or so constipation, before that more diarrhea/soft stool. Not sure why. No vomiting. No melena, brbpr. Has had nausea, taking a lot of zofran this week, few days.  With his typical Crohn's flare, typically has abdominal pain.  Never really has not had a lot of diarrhea.  Has Cimzia done by rheumatologist, Marella Chimes.     Current Outpatient Medications  Medication Sig Dispense Refill  . acetaminophen (TYLENOL) 500 MG tablet Take 500-1,000 mg by mouth every 6 (six) hours as needed (for pain).    . cholecalciferol (VITAMIN D) 1000 units tablet Take 1 tablet (1,000 Units total) by mouth daily.  BEGIN AFTER WEEKLY VIT D DOSING IS COMPLETE (Patient taking differently: Take 1,000 Units by mouth 3 (three) times a week.) 30 tablet 11  . CIMZIA PREFILLED 2 X 200 MG/ML KIT Inject 400 mg into the skin every 30 (thirty) days. 1 kit 3  . cloNIDine (CATAPRES) 0.1 MG tablet Take 1 tablet (0.1 mg total) by mouth 2 (two) times daily. 60 tablet 11  . Multiple Vitamin (MULTIVITAMIN WITH MINERALS) TABS tablet Take 1 tablet by mouth daily. One-A-Day for Men    . ondansetron (ZOFRAN ODT) 4 MG disintegrating tablet Take 1 tablet (4 mg total) by mouth every 8 (eight) hours as needed for nausea or vomiting. 30 tablet 5   No current facility-administered medications for this visit.    Allergies as of 08/12/2020 - Review Complete 08/12/2020  Allergen Reaction Noted  . Penicillins Swelling and Rash 08/15/2008  . Azathioprine Nausea And Vomiting 11/25/2009  . Iodinated diagnostic agents Nausea And Vomiting 08/20/2013  . Metrizamide Nausea And Vomiting 08/20/2013  . Pentasa [mesalamine er] Rash 07/11/2013    ROS:  General: Negative for anorexia, weight loss, fever, chills, fatigue, weakness. ENT: Negative for hoarseness, difficulty swallowing , nasal congestion. CV: Negative for chest pain, angina, palpitations, dyspnea on exertion, peripheral edema.  Respiratory: Negative for dyspnea at rest, dyspnea on exertion, cough, sputum, wheezing.  GI: See history of present illness. GU:  Negative for dysuria, hematuria, urinary incontinence, urinary frequency, nocturnal urination.  Endo: Negative for unusual weight change.  Physical Examination:   BP (!) 145/98   Pulse 84   Temp (!) 97 F (36.1 C)   Ht _0  (1.778 m)   Wt 144 lb (65.3 kg)   BMI 20.66 kg/m   General: Well-nourished, well-developed in no acute distress.  Eyes: No icterus. Mouth:masked Lungs: Clear to auscultation bilaterally.  Heart: Regular rate and rhythm, no murmurs rubs or gallops.  Abdomen: Bowel sounds are normal,  nontender, nondistended, no hepatosplenomegaly or masses, no abdominal bruits or hernia , no rebound or guarding.   Extremities: No lower extremity edema. No clubbing or deformities. Neuro: Alert and oriented x 4   Skin: Warm and dry, no jaundice.   Psych: Alert and cooperative, normal mood and affect.  Labs:  Lab Results  Component Value Date   CREATININE 1.03 07/12/2019   BUN 8 07/12/2019   NA 138 07/12/2019   K 4.0 07/12/2019   CL 100 07/12/2019   CO2 26 07/12/2019   Lab Results  Component Value Date   WBC 10.6 01/29/2020   HGB 11.2 (L) 01/29/2020   HCT 35.2 (L) 01/29/2020   MCV 77.2 (L) 01/29/2020   PLT 302 01/29/2020   Lab Results  Component Value Date   IRON <10 (L) 05/28/2016   TIBC 250 05/28/2016   FERRITIN 216 05/28/2016   Lab Results  Component Value Date   VITAMINB12 340 03/29/2018   No results found for: FOLATE Lab Results  Component Value Date   ALT 12 07/12/2019   AST 19 07/12/2019   ALKPHOS 68 07/12/2019   BILITOT 1.0 07/12/2019     Imaging Studies: No results found.  Assessment:  Pleasant 45 year old male with history of Crohn's disease with history of obstruction requiring resection, maintained on Cimzia.  CT August 2021 with findings suspicious for active Crohn's involving the neoterminal ileum, bilateral SI joint disease likely Crohn's related sacroiliitis.  CRP elevated at 14.3.  Recent colonoscopy showing chronic nonspecific inflammation of the ileocolonic anastomosis anastomosis.  Clinically he does okay, intermittent mild symptoms.  Endoscopically with evidence of active Crohn's.  At last office visit it was discussed adding immune modulator such as azathioprine.  TPMT level obtained which was normal.  Constipation, likely multifactorial but has been increased in the setting of regular Zofran use.  Plan:  1. Update TB Gold.  Hep B serologies are up-to-date. 2. Update anemia labs. 3. To discuss with Dr. Abbey Chatters regarding pursuing  azathioprine. 4. Continue Cimzia, patient receives this through his rheumatologist. 5. Add MiraLAX 1 capful twice daily until soft stool, then continue once daily as needed.

## 2020-08-12 NOTE — Patient Instructions (Addendum)
1. Add MiraLAX 1 capful twice daily until soft stool, then continue once daily as needed.  Each day evaluate your bowel movements, if stools are hard then take 1 capful of MiraLAX, if adequate bowel movement then you may skip a day. 2. Continue Cimzia 400 mg every 30 days. 3. I will be in touch with you regarding new medication, azathioprine, once discussed with Dr. Abbey Chatters.  Azathioprine tablets What is this medicine? AZATHIOPRINE (ay za THYE oh preen) suppresses the immune system. It is used to prevent organ rejection after a transplant. It is also used to treat rheumatoid arthritis. This medicine may be used for other purposes; ask your health care provider or pharmacist if you have questions. COMMON BRAND NAME(S): Azasan, Imuran What should I tell my health care provider before I take this medicine? They need to know if you have any of these conditions:  infection  kidney disease  liver disease  an unusual or allergic reaction to azathioprine, other medicines, lactose, foods, dyes, or preservatives  pregnant or trying to get pregnant  breast feeding How should I use this medicine? Take this medicine by mouth with a full glass of water. Follow the directions on the prescription label. Take your medicine at regular intervals. Do not take your medicine more often than directed. Continue to take your medicine even if you feel better. Do not stop taking except on your doctor's advice. Talk to your pediatrician regarding the use of this medicine in children. Special care may be needed. Overdosage: If you think you have taken too much of this medicine contact a poison control center or emergency room at once. NOTE: This medicine is only for you. Do not share this medicine with others. What if I miss a dose? If you miss a dose, take it as soon as you can. If it is almost time for your next dose, take only that dose. Do not take double or extra doses. What may interact with this medicine? Do  not take this medicine with any of the following medications:  febuxostat  mercaptopurine This medicine may also interact with the following medications:  allopurinol  aminosalicylates like sulfasalazine, mesalamine, balsalazide, and olsalazine  leflunomide  medicines called ACE inhibitors like benazepril, captopril, enalapril, fosinopril, quinapril, lisinopril, ramipril, and trandolapril  mycophenolate  sulfamethoxazole; trimethoprim  vaccines  warfarin This list may not describe all possible interactions. Give your health care provider a list of all the medicines, herbs, non-prescription drugs, or dietary supplements you use. Also tell them if you smoke, drink alcohol, or use illegal drugs. Some items may interact with your medicine. What should I watch for while using this medicine? Visit your doctor or health care professional for regular checks on your progress. You will need frequent blood checks during the first few months you are receiving the medicine. If you get a cold or other infection while receiving this medicine, call your doctor or health care professional. Do not treat yourself. The medicine may increase your risk of getting an infection. Women should inform their doctor if they wish to become pregnant or think they might be pregnant. There is a potential for serious side effects to an unborn child. Talk to your health care professional or pharmacist for more information. Men may have a reduced sperm count while they are taking this medicine. Talk to your health care professional for more information. This medicine may increase your risk of getting certain kinds of cancer. Talk to your doctor about healthy lifestyle choices, important  screenings, and your risk. What side effects may I notice from receiving this medicine? Side effects that you should report to your doctor or health care professional as soon as possible:  allergic reactions like skin rash, itching or  hives, swelling of the face, lips, or tongue  changes in vision  confusion  fever, chills, or any other sign of infection  loss of balance or coordination  severe stomach pain  unusual bleeding, bruising  unusually weak or tired  vomiting  yellowing of the eyes or skin Side effects that usually do not require medical attention (report to your doctor or health care professional if they continue or are bothersome):  hair loss  nausea This list may not describe all possible side effects. Call your doctor for medical advice about side effects. You may report side effects to FDA at 1-800-FDA-1088. Where should I keep my medicine? Keep out of the reach of children. Store at room temperature between 15 and 25 degrees C (59 and 77 degrees F). Protect from light. Throw away any unused medicine after the expiration date. NOTE: This sheet is a summary. It may not cover all possible information. If you have questions about this medicine, talk to your doctor, pharmacist, or health care provider.  2021 Elsevier/Gold Standard (2013-09-25 12:00:31)

## 2020-08-18 ENCOUNTER — Encounter: Payer: Self-pay | Admitting: Gastroenterology

## 2020-09-02 DIAGNOSIS — K50111 Crohn's disease of large intestine with rectal bleeding: Secondary | ICD-10-CM | POA: Diagnosis not present

## 2020-09-11 ENCOUNTER — Other Ambulatory Visit: Payer: Self-pay

## 2020-09-11 ENCOUNTER — Telehealth: Payer: Self-pay | Admitting: Gastroenterology

## 2020-09-11 ENCOUNTER — Emergency Department (HOSPITAL_COMMUNITY)
Admission: EM | Admit: 2020-09-11 | Discharge: 2020-09-11 | Disposition: A | Discharge status: Home / Self Care | Payer: Medicare Other | Attending: Emergency Medicine | Admitting: Emergency Medicine

## 2020-09-11 ENCOUNTER — Encounter (HOSPITAL_COMMUNITY): Payer: Self-pay | Admitting: Emergency Medicine

## 2020-09-11 DIAGNOSIS — F17210 Nicotine dependence, cigarettes, uncomplicated: Secondary | ICD-10-CM | POA: Diagnosis not present

## 2020-09-11 DIAGNOSIS — Z79899 Other long term (current) drug therapy: Secondary | ICD-10-CM | POA: Diagnosis not present

## 2020-09-11 DIAGNOSIS — R197 Diarrhea, unspecified: Secondary | ICD-10-CM | POA: Insufficient documentation

## 2020-09-11 DIAGNOSIS — E860 Dehydration: Secondary | ICD-10-CM | POA: Diagnosis not present

## 2020-09-11 DIAGNOSIS — K50812 Crohn's disease of both small and large intestine with intestinal obstruction: Secondary | ICD-10-CM

## 2020-09-11 DIAGNOSIS — I10 Essential (primary) hypertension: Secondary | ICD-10-CM | POA: Diagnosis not present

## 2020-09-11 LAB — CBC WITH DIFFERENTIAL/PLATELET
Abs Immature Granulocytes: 0.04 10*3/uL (ref 0.00–0.07)
Basophils Absolute: 0.1 10*3/uL (ref 0.0–0.1)
Basophils Relative: 1 %
Eosinophils Absolute: 0 10*3/uL (ref 0.0–0.5)
Eosinophils Relative: 0 %
HCT: 39.1 % (ref 39.0–52.0)
Hemoglobin: 12.6 g/dL — ABNORMAL LOW (ref 13.0–17.0)
Immature Granulocytes: 1 %
Lymphocytes Relative: 29 %
Lymphs Abs: 2.3 10*3/uL (ref 0.7–4.0)
MCH: 23.4 pg — ABNORMAL LOW (ref 26.0–34.0)
MCHC: 32.2 g/dL (ref 30.0–36.0)
MCV: 72.5 fL — ABNORMAL LOW (ref 80.0–100.0)
Monocytes Absolute: 0.5 10*3/uL (ref 0.1–1.0)
Monocytes Relative: 6 %
Neutro Abs: 5 10*3/uL (ref 1.7–7.7)
Neutrophils Relative %: 63 %
Platelets: 313 10*3/uL (ref 150–400)
RBC: 5.39 MIL/uL (ref 4.22–5.81)
RDW: 17.2 % — ABNORMAL HIGH (ref 11.5–15.5)
WBC: 7.9 10*3/uL (ref 4.0–10.5)
nRBC: 0 % (ref 0.0–0.2)

## 2020-09-11 LAB — COMPREHENSIVE METABOLIC PANEL
ALT: 18 U/L (ref 0–44)
AST: 26 U/L (ref 15–41)
Albumin: 4.2 g/dL (ref 3.5–5.0)
Alkaline Phosphatase: 69 U/L (ref 38–126)
Anion gap: 10 (ref 5–15)
BUN: 9 mg/dL (ref 6–20)
CO2: 23 mmol/L (ref 22–32)
Calcium: 9.1 mg/dL (ref 8.9–10.3)
Chloride: 103 mmol/L (ref 98–111)
Creatinine, Ser: 0.87 mg/dL (ref 0.61–1.24)
GFR, Estimated: >60 mL/min (ref >60)
Glucose, Bld: 98 mg/dL (ref 70–99)
Potassium: 4 mmol/L (ref 3.5–5.1)
Sodium: 136 mmol/L (ref 135–145)
Total Bilirubin: 0.7 mg/dL (ref 0.3–1.2)
Total Protein: 7.8 g/dL (ref 6.5–8.1)

## 2020-09-11 MED ORDER — SODIUM CHLORIDE 0.9 % IV BOLUS
1000.0000 mL | Freq: Once | INTRAVENOUS | Status: AC
  Administered 2020-09-11: 1000 mL via INTRAVENOUS

## 2020-09-11 MED ORDER — ONDANSETRON 4 MG PO TBDP: ORAL_TABLET | ORAL | 0 refills | Status: DC

## 2020-09-11 MED ORDER — CLONIDINE HCL 0.1 MG PO TABS
0.1000 mg | ORAL_TABLET | Freq: Once | ORAL | Status: AC
  Administered 2020-09-11: 0.1 mg via ORAL
  Filled 2020-09-11: qty 1

## 2020-09-11 MED ORDER — HYDRALAZINE HCL 20 MG/ML IJ SOLN
5.0000 mg | Freq: Once | INTRAMUSCULAR | Status: AC
  Administered 2020-09-11: 5 mg via INTRAVENOUS
  Filled 2020-09-11: qty 1

## 2020-09-11 NOTE — Telephone Encounter (Signed)
Phoned and spoke with the pt regarding Dr. Ave Filter advise so we can be provided with more information on his ongoing treatment and the pt agreed to be seen at [place]   2. Pt is agreeable to being referred to [place]

## 2020-09-11 NOTE — Telephone Encounter (Signed)
Noted  

## 2020-09-11 NOTE — Discharge Instructions (Signed)
Drink plenty of fluids.  Follow-up with your stomach specialist next week and also see your family doctor about your blood pressure.  Return if any problem

## 2020-09-11 NOTE — ED Provider Notes (Signed)
[facility] EMERGENCY DEPARTMENT Provider Note   CSN: [id number] Arrival date & time: 09/11/20  1732     History Chief Complaint  Patient presents with  . Diarrhea    Joshua Robertson is a 45 y.o. male.  Patient complains of some diarrhea and nausea.  No blood seen in his diarrhea.  Patient is not sure if he took his blood pressure medicine today  The history is provided by the patient and medical records. No language interpreter was used.  Diarrhea Quality:  Mucous Severity:  Moderate Onset quality:  Sudden Timing:  Intermittent Progression:  Unchanged Relieved by:  Nothing Worsened by:  Nothing Associated symptoms: no abdominal pain and no headaches        Past Medical History:  Diagnosis Date  . BMI between 19-24,adult JAN 2010 149 LBS  . Crohn's disease ([place]) AUG 2009 NUR [place]   HB 9.4 FERRITIN 26  140 LBS; no serologies-pt can't afford test  . Helicobacter pylori gastritis 2009  . Hypertension   . Migraine headache    NONE SINCE AGE 27  . Sacroiliitis ([place]) 2009  . Sickle cell trait ([place])   . Spondyloarthropathy 2009 EROSIVE    Patient Active Problem List   Diagnosis Date Noted  . Constipation 08/12/2020  . Asymptomatic hypertensive urgency 07/25/2019  . At risk for osteopenia 03/15/2018  . Internal and external thrombosed hemorrhoids   . Joint pain 07/27/2017  . Hemorrhoids, internal, thrombosed 07/27/2017  . HNP (herniated nucleus pulposus), lumbar 02/04/2017  . Marijuana abuse 06/01/2016  . Anemia 05/28/2016  . Pain of left great toe 04/02/2015  . Essential hypertension 10/22/2010  . MIGRAINE HEADACHE 02/04/2009  . Dermatophytosis of body 11/22/2008  . Crohn's disease of both small and large intestine with intestinal obstruction ([place]) 08/15/2008    Past Surgical History:  Procedure Laterality Date  . BACK SURGERY     2018  outpatient x2  . BIOPSY  09/06/2017   Procedure: BIOPSY;  Surgeon: [name], MD;  Location[facility];  Service:  Endoscopy;;  colon ileum  . BIOPSY  04/18/2020   Procedure: BIOPSY;  Surgeon: Eloise Harman, DO;  Location: [facility];  Service: Endoscopy;;  . COLON RESECTION     due to chrohn's  . COLONOSCOPY  03/2015   [facility]: Crohn's like stricture at ICV. Stricture was not traversable. abnormal mucosa of ICV. Bx, c/w Crohn's.   . COLONOSCOPY N/A 07/30/2013   stenotic IC VALVE. UNABLE TO INTUBATE ILEUM.  Marland Kitchen COLONOSCOPY WITH PROPOFOL N/A 09/06/2017   Procedure: COLONOSCOPY WITH PROPOFOL;  Surgeon: [name], MD;  [facility];  Service: Endoscopy;  Laterality: N/A;  7:30am  . COLONOSCOPY WITH PROPOFOL N/A 04/18/2020   Dr. Abbey Chatters: Nonbleeding internal hemorrhoids, patent end-to-side ileocolonic anastomosis characterized by erosion, erythema, inflammation and ulceration.  Biopsies showed nonspecific chronic inflammation at the anastomosis.  Marland Kitchen HEMORRHOID SURGERY N/A 08/01/2017   Procedure: HEMORRHOIDECTOMY;  Surgeon: Aviva Signs, MD;  Location: [facility] OR[facility];  Service: General;  Laterality: N/A;  . LUMBAR LAMINECTOMY/DECOMPRESSION MICRODISCECTOMY Right 02/04/2017   Procedure: Microdiscectomy - Lumbar four-five - right redo;  Surgeon: Kary Kos, MD;  Location: [place];  Service: Neurosurgery;  Laterality: Right;       Family History  Problem Relation Age of Onset  . Hypertension Mother   . Hypertension Father   . Colon cancer Neg Hx   . Colon polyps Neg Hx   . Inflammatory bowel disease Neg Hx  Social History   Tobacco Use  . Smoking status: Current Every Day Smoker    Packs/day: 0.25    Types: Cigarettes  . Smokeless tobacco: Never Used  . Tobacco comment: 6 cig daily as of 07/29/2017  Vaping Use  . Vaping Use: Never used  Substance Use Topics  . Alcohol use: Yes    Alcohol/week: 0.0 standard drinks    Comment: very rare  . Drug use: Yes    Types: Marijuana    Comment: 2-3 times daily Helps appetite and stomach.    Home Medications Prior to Admission  medications   Medication Sig Start Date End Date Taking? Authorizing Provider  ondansetron (ZOFRAN ODT) 4 MG disintegrating tablet 61m ODT q4 hours prn nausea/vomit 09/11/20  Yes ZMilton Ferguson MD  acetaminophen (TYLENOL) 500 MG tablet Take 500-1,000 mg by mouth every 6 (six) hours as needed (for pain).    [provider]  cholecalciferol (VITAMIN D) 1000 units tablet Take 1 tablet (1,000 Units total) by mouth daily. BEGIN AFTER WEEKLY VIT D DOSING IS COMPLETE Patient taking differently: Take 1,000 Units by mouth 3 (three) times a week. 04/11/16   Fields, SMarga Melnick MD  CIMZIA PREFILLED 2 X 200 MG/ML KIT Inject 400 mg into the skin every 30 (thirty) days. 07/02/20   LMahala Menghini PA-C  cloNIDine (CATAPRES) 0.1 MG tablet Take 1 tablet (0.1 mg total) by mouth 2 (two) times daily. 08/09/12   Fields, SMarga Melnick MD  Multiple Vitamin (MULTIVITAMIN WITH MINERALS) TABS tablet Take 1 tablet by mouth daily. One-A-Day for Men    [provider]    Allergies    Penicillins, Azathioprine, Iodinated diagnostic agents, Metrizamide, and Pentasa [mesalamine er]  Review of Systems   Review of Systems  Constitutional: Negative for appetite change and fatigue.  HENT: Negative for congestion, ear discharge and sinus pressure.   Eyes: Negative for discharge.  Respiratory: Negative for cough.   Cardiovascular: Negative for chest pain.  Gastrointestinal: Positive for diarrhea. Negative for abdominal pain.  Genitourinary: Negative for frequency and hematuria.  Musculoskeletal: Negative for back pain.  Skin: Negative for rash.  Neurological: Negative for seizures and headaches.  Psychiatric/Behavioral: Negative for hallucinations.    Physical Exam Updated Vital Signs BP (!) 190/113   Pulse (!) 55   Temp 98 F (36.7 C) (Oral)   Resp 18   Ht 5' 10"  (1.778 m)   Wt 63.5 kg   SpO2 100%   BMI 20.09 kg/m   Physical Exam Vitals and nursing note reviewed.  Constitutional:      Appearance: He  is well-developed.  HENT:     Head: Normocephalic.     Nose: Nose normal.  Eyes:     General: No scleral icterus.    Conjunctiva/sclera: Conjunctivae normal.  Neck:     Thyroid: No thyromegaly.  Cardiovascular:     Rate and Rhythm: Normal rate and regular rhythm.     Heart sounds: No murmur heard. No friction rub. No gallop.   Pulmonary:     Breath sounds: No stridor. No wheezing or rales.  Chest:     Chest wall: No tenderness.  Abdominal:     General: There is no distension.     Tenderness: There is no abdominal tenderness. There is no rebound.  Musculoskeletal:        General: Normal range of motion.     Cervical back: Neck supple.  Lymphadenopathy:     Cervical: No cervical adenopathy.  Skin:  Findings: No erythema or rash.  Neurological:     Mental Status: He is alert and oriented to person, place, and time.     Motor: No abnormal muscle tone.     Coordination: Coordination normal.  Psychiatric:        Behavior: Behavior normal.     ED Results / Procedures / Treatments   Labs (all labs ordered are listed, but only abnormal results are displayed) Labs Reviewed  CBC WITH DIFFERENTIAL/PLATELET - Abnormal; Notable for the following components:      Result Value   Hemoglobin 12.6 (*)    MCV 72.5 (*)    MCH 23.4 (*)    RDW 17.2 (*)    All other components within normal limits  COMPREHENSIVE METABOLIC PANEL    EKG None  Radiology No results found.  Procedures Procedures   Medications Ordered in ED Medications  sodium chloride 0.9 % bolus 1,000 mL (0 mLs Intravenous Stopped 09/11/20 2208)  cloNIDine (CATAPRES) tablet 0.1 mg (0.1 mg Oral Given 09/11/20 2125)  cloNIDine (CATAPRES) tablet 0.1 mg (0.1 mg Oral Given 09/11/20 2207)    ED Course  I have reviewed the triage vital signs and the nursing notes.  Pertinent labs & imaging results that were available during my care of the patient were reviewed by me and considered in my medical decision making (see  chart for details).    MDM Rules/Calculators/A&P                          Patient with poorly controlled hypertension and diarrhea with mild dehydration.  He improved with fluids and will follow up with his GI doctor and his family doctor for his blood pressure Final Clinical Impression(s) / ED Diagnoses Final diagnoses:  Diarrhea, unspecified type    Rx / DC Orders ED Discharge Orders         Ordered    ondansetron (ZOFRAN ODT) 4 MG disintegrating tablet        09/11/20 2232           Milton Ferguson, MD 09/14/20 1630

## 2020-09-11 NOTE — ED Triage Notes (Signed)
Pt c/o nausea with diarrhea, body aches, decreased appetite x 2 days; denies emesis and abd pain; pt reports highest his temp has been is 99.0

## 2020-09-11 NOTE — Telephone Encounter (Signed)
He has been seen by Dr. Nyoka Cowden at [facility] for his crohn's but it has been few years.

## 2020-09-11 NOTE — Telephone Encounter (Signed)
Faxed to [facility] Dr. Nyoka Cowden for appointment

## 2020-09-11 NOTE — Telephone Encounter (Signed)
Please let pt know that Dr. Abbey Chatters and I have discussed his case further. Dr. Abbey Chatters would like for him to go be seen at [place] (with an IBD specialist) to have them optimize his Crohn's treatment. We can continue to follow here after they provide some advice for ongoing treatment.   Remind him to get labs done.   Please make referral if patient agreeable.

## 2020-09-11 NOTE — Addendum Note (Signed)
Addended by: Cheron Every on: 09/11/2020 04:31 PM   Modules accepted: Orders

## 2020-09-12 ENCOUNTER — Telehealth: Payer: Self-pay | Admitting: Internal Medicine

## 2020-09-12 DIAGNOSIS — Z79899 Other long term (current) drug therapy: Secondary | ICD-10-CM | POA: Diagnosis not present

## 2020-09-12 DIAGNOSIS — I10 Essential (primary) hypertension: Secondary | ICD-10-CM | POA: Diagnosis not present

## 2020-09-12 DIAGNOSIS — F17210 Nicotine dependence, cigarettes, uncomplicated: Secondary | ICD-10-CM | POA: Diagnosis not present

## 2020-09-12 DIAGNOSIS — R197 Diarrhea, unspecified: Secondary | ICD-10-CM | POA: Diagnosis not present

## 2020-09-12 DIAGNOSIS — M791 Myalgia, unspecified site: Secondary | ICD-10-CM | POA: Diagnosis not present

## 2020-09-12 DIAGNOSIS — R059 Cough, unspecified: Secondary | ICD-10-CM | POA: Diagnosis not present

## 2020-09-12 DIAGNOSIS — K5090 Crohn's disease, unspecified, without complications: Secondary | ICD-10-CM | POA: Diagnosis not present

## 2020-09-12 NOTE — Telephone Encounter (Signed)
Joshua Robertson:HCCEQD the pt back , was advised of his blood pressure being high since last night. He also has fevers, chills and body aches. I advised the pt he needs to be advising his PCP of this because he handles blood pressure and his care with the exception of his GI. Pt stated he thought it was due to his crohn's disease. I advised the pt let the hospital do what needs to be done regarding getting his blood pressure under control and they will take care of what comes next. He agreed. ( routing to [employer] and Dr. Abbey Chatters per Vicente Males because I asked first)

## 2020-09-12 NOTE — Telephone Encounter (Signed)
[phone] please call patient asap, he is at the hospital in [place].  His wife called and said they are concerned about his blood pressure

## 2020-09-12 NOTE — Telephone Encounter (Signed)
Spoke with patient. For the last 3 days, he has had elevated blood pressure, temperature ranging from 97-99, body aches, and cough with phlegm production.  Denies abdominal pain or vomiting. Doesn't have much of an appetite. He did have some diarrhea couple days ago with 3-4 loose bowel movements.  No diarrhea today. No brbpr or melena. Reports the emergency room tested him for Covid and this was negative.  He was just discharged and advised to follow-up with his primary care provider.  Blood pressure medications were adjusted.  Doubt this is related to Crohn's disease.  Suspect acute viral illness.  Advised to focus on hydration over the weekend, clear liquid and/or bland diet.  Proceed to the emergency room for any worsening symptoms, persistent fever, severe abdominal pain, shortness of breath, etc. he was advised to call our office on Monday if he has any abdominal pain or persistent diarrhea.  Routine to Joshua Robertson as FYI as she has been following with this patient.

## 2020-09-15 NOTE — Telephone Encounter (Signed)
Noted. Agree with recommendation provided.

## 2020-09-24 ENCOUNTER — Other Ambulatory Visit: Payer: Self-pay | Admitting: Gastroenterology

## 2020-09-24 NOTE — Telephone Encounter (Signed)
Please clarify dose with patient. We have him down taking 434m [place] q4wk.   Request from pharmacy says 2063mSC q2wk.

## 2020-09-25 ENCOUNTER — Telehealth (INDEPENDENT_AMBULATORY_CARE_PROVIDER_SITE_OTHER): Payer: Self-pay

## 2020-09-25 NOTE — Telephone Encounter (Signed)
He should be accepted into the practice. Please get him scheduled for a new patient appointment. Thank you.

## 2020-09-25 NOTE — Telephone Encounter (Signed)
To setup new patient appointment.

## 2020-09-25 NOTE — Telephone Encounter (Signed)
Phoned and LM on vm for the pt to return call regarding medication

## 2020-09-29 ENCOUNTER — Telehealth: Payer: Self-pay | Admitting: Internal Medicine

## 2020-09-29 NOTE — Telephone Encounter (Signed)
Pt returning call. [phone]

## 2020-09-30 ENCOUNTER — Telehealth: Payer: Self-pay | Admitting: Internal Medicine

## 2020-09-30 ENCOUNTER — Other Ambulatory Visit: Payer: Self-pay

## 2020-09-30 DIAGNOSIS — K50111 Crohn's disease of large intestine with rectal bleeding: Secondary | ICD-10-CM | POA: Diagnosis not present

## 2020-09-30 NOTE — Telephone Encounter (Signed)
Please call [facility] and ask for [facility]. [phone] ext [phone]

## 2020-09-30 NOTE — Telephone Encounter (Signed)
Scheduled patient for wed 10/29/20 at 3:30pm for new patient

## 2020-10-01 ENCOUNTER — Telehealth: Payer: Self-pay

## 2020-10-01 NOTE — Telephone Encounter (Signed)
Received refill request from [facility] for Cimzia. Walden Field NP completed refill request. Request faxed to [facility].

## 2020-10-01 NOTE — Telephone Encounter (Signed)
I spoke with the pt re: clarifying Rx for Cimzia. See refill request.

## 2020-10-01 NOTE — Telephone Encounter (Signed)
Refill completed.

## 2020-10-01 NOTE — Telephone Encounter (Signed)
I spoke with the pt, he said Dr.Fields had him on 454m (2 syringes) once a month and he is doing well on it and not having any problems.

## 2020-10-03 ENCOUNTER — Telehealth: Payer: Self-pay

## 2020-10-03 NOTE — Telephone Encounter (Signed)
Spoke to Joshua Robertson from [place] advised the pt is taking 2 shots per month per clarification from the pt.

## 2020-10-28 DIAGNOSIS — K50111 Crohn's disease of large intestine with rectal bleeding: Secondary | ICD-10-CM | POA: Diagnosis not present

## 2020-10-29 ENCOUNTER — Ambulatory Visit (INDEPENDENT_AMBULATORY_CARE_PROVIDER_SITE_OTHER): Payer: Medicare Other | Admitting: Nurse Practitioner

## 2020-10-30 ENCOUNTER — Ambulatory Visit (INDEPENDENT_AMBULATORY_CARE_PROVIDER_SITE_OTHER): Payer: Medicare Other | Admitting: Nurse Practitioner

## 2020-11-25 DIAGNOSIS — K50111 Crohn's disease of large intestine with rectal bleeding: Secondary | ICD-10-CM | POA: Diagnosis not present

## 2020-12-18 ENCOUNTER — Ambulatory Visit (INDEPENDENT_AMBULATORY_CARE_PROVIDER_SITE_OTHER): Payer: Medicare Other | Admitting: Nurse Practitioner

## 2020-12-23 DIAGNOSIS — K50111 Crohn's disease of large intestine with rectal bleeding: Secondary | ICD-10-CM | POA: Diagnosis not present

## 2021-01-12 DIAGNOSIS — Z23 Encounter for immunization: Secondary | ICD-10-CM | POA: Diagnosis not present

## 2021-01-12 DIAGNOSIS — I10 Essential (primary) hypertension: Secondary | ICD-10-CM | POA: Diagnosis not present

## 2021-01-12 DIAGNOSIS — Z0001 Encounter for general adult medical examination with abnormal findings: Secondary | ICD-10-CM | POA: Diagnosis not present

## 2021-01-12 DIAGNOSIS — K5000 Crohn's disease of small intestine without complications: Secondary | ICD-10-CM | POA: Diagnosis not present

## 2021-01-12 DIAGNOSIS — F17210 Nicotine dependence, cigarettes, uncomplicated: Secondary | ICD-10-CM | POA: Diagnosis not present

## 2021-01-20 DIAGNOSIS — K50111 Crohn's disease of large intestine with rectal bleeding: Secondary | ICD-10-CM | POA: Diagnosis not present

## 2021-01-23 DIAGNOSIS — R509 Fever, unspecified: Secondary | ICD-10-CM | POA: Diagnosis not present

## 2021-01-23 DIAGNOSIS — I10 Essential (primary) hypertension: Secondary | ICD-10-CM | POA: Diagnosis not present

## 2021-01-23 DIAGNOSIS — I7090 Unspecified atherosclerosis: Secondary | ICD-10-CM | POA: Diagnosis not present

## 2021-01-23 DIAGNOSIS — U071 COVID-19: Secondary | ICD-10-CM | POA: Diagnosis not present

## 2021-01-23 DIAGNOSIS — M545 Low back pain, unspecified: Secondary | ICD-10-CM | POA: Diagnosis not present

## 2021-01-23 DIAGNOSIS — M4316 Spondylolisthesis, lumbar region: Secondary | ICD-10-CM | POA: Diagnosis not present

## 2021-01-23 DIAGNOSIS — Z880 Allergy status to penicillin: Secondary | ICD-10-CM | POA: Diagnosis not present

## 2021-01-23 DIAGNOSIS — Z91041 Radiographic dye allergy status: Secondary | ICD-10-CM | POA: Diagnosis not present

## 2021-01-23 DIAGNOSIS — F17210 Nicotine dependence, cigarettes, uncomplicated: Secondary | ICD-10-CM | POA: Diagnosis not present

## 2021-01-29 DIAGNOSIS — Z681 Body mass index (BMI) 19 or less, adult: Secondary | ICD-10-CM | POA: Diagnosis not present

## 2021-02-17 DIAGNOSIS — K50111 Crohn's disease of large intestine with rectal bleeding: Secondary | ICD-10-CM | POA: Diagnosis not present

## 2021-03-05 ENCOUNTER — Other Ambulatory Visit: Payer: Self-pay | Admitting: Internal Medicine

## 2021-03-05 NOTE — Telephone Encounter (Signed)
Received a refill request for Zofran.  Looks like this medication was prescribed by Dr. Roderic Palau, an emergency room physician.  Recommend patient reach out to his primary care provider if he needs additional refills on this.

## 2021-03-06 NOTE — Telephone Encounter (Signed)
Tried to phone the pt and his phone was busy

## 2021-03-09 NOTE — Telephone Encounter (Signed)
Phoned pt x 2 line was busy.

## 2021-03-09 NOTE — Telephone Encounter (Signed)
Phoned the pt LMOVM to return call

## 2021-03-09 NOTE — Telephone Encounter (Signed)
Pt returned my call and I advised him of the recommendations concerning his zofran. The pt expressed he has always had a standing order for refills because of his crohns disease. I advised the pt that I did not see that in his chart and if he was having issues he needed a office visit to be evaluated before this can be filled by [place]. I also did not see a follow-up visit for him. Please advise whether he needs ov for meds or as I had instructed to go to PCP or Dr. Roderic Palau.

## 2021-03-09 NOTE — Telephone Encounter (Signed)
Noted. I am refilling his Zofran.   No need to arrange f/u at this time. We referred him to [facility]. He has appt scheduled.

## 2021-03-10 NOTE — Telephone Encounter (Signed)
Phoned the pt x 2 line was busy

## 2021-03-11 NOTE — Telephone Encounter (Signed)
Attempted to call the pt again today, his line was busy.

## 2021-03-16 NOTE — Telephone Encounter (Signed)
Letter mailed to the pt today

## 2021-03-17 DIAGNOSIS — R5383 Other fatigue: Secondary | ICD-10-CM | POA: Diagnosis not present

## 2021-03-17 DIAGNOSIS — Z79899 Other long term (current) drug therapy: Secondary | ICD-10-CM | POA: Diagnosis not present

## 2021-03-17 DIAGNOSIS — K50111 Crohn's disease of large intestine with rectal bleeding: Secondary | ICD-10-CM | POA: Diagnosis not present

## 2021-03-20 ENCOUNTER — Telehealth: Payer: Self-pay | Admitting: Gastroenterology

## 2021-03-20 NOTE — Telephone Encounter (Signed)
We received refill request from Joshua Robertson for Cimzia. Completed and placed on Tammy's desk to be faxed. Patient has upcoming appt with Dr. Nyoka Cowden at [facility].  Please arrange for follow up with Dr. Abbey Chatters in 08/2021.

## 2021-03-20 NOTE — Telephone Encounter (Signed)
Form was faxed back to [facility] [facility] for Cimzia. Fax confirmation was received.

## 2021-04-14 DIAGNOSIS — K50111 Crohn's disease of large intestine with rectal bleeding: Secondary | ICD-10-CM | POA: Diagnosis not present

## 2021-04-16 DIAGNOSIS — K5090 Crohn's disease, unspecified, without complications: Secondary | ICD-10-CM | POA: Diagnosis not present

## 2021-04-16 DIAGNOSIS — Z79899 Other long term (current) drug therapy: Secondary | ICD-10-CM | POA: Diagnosis not present

## 2021-04-16 DIAGNOSIS — K50919 Crohn's disease, unspecified, with unspecified complications: Secondary | ICD-10-CM | POA: Diagnosis not present

## 2021-05-13 DIAGNOSIS — K50919 Crohn's disease, unspecified, with unspecified complications: Secondary | ICD-10-CM | POA: Diagnosis not present

## 2021-05-13 DIAGNOSIS — K5000 Crohn's disease of small intestine without complications: Secondary | ICD-10-CM | POA: Diagnosis not present

## 2021-05-20 DIAGNOSIS — Z79899 Other long term (current) drug therapy: Secondary | ICD-10-CM | POA: Diagnosis not present

## 2021-05-20 DIAGNOSIS — M25561 Pain in right knee: Secondary | ICD-10-CM | POA: Diagnosis not present

## 2021-05-20 DIAGNOSIS — Z681 Body mass index (BMI) 19 or less, adult: Secondary | ICD-10-CM | POA: Diagnosis not present

## 2021-05-20 DIAGNOSIS — M0760 Enteropathic arthropathies, unspecified site: Secondary | ICD-10-CM | POA: Diagnosis not present

## 2021-05-20 DIAGNOSIS — K50111 Crohn's disease of large intestine with rectal bleeding: Secondary | ICD-10-CM | POA: Diagnosis not present

## 2021-07-21 DIAGNOSIS — K50111 Crohn's disease of large intestine with rectal bleeding: Secondary | ICD-10-CM | POA: Diagnosis not present

## 2021-08-18 DIAGNOSIS — K50111 Crohn's disease of large intestine with rectal bleeding: Secondary | ICD-10-CM | POA: Diagnosis not present

## 2021-08-27 DIAGNOSIS — K50919 Crohn's disease, unspecified, with unspecified complications: Secondary | ICD-10-CM | POA: Diagnosis not present

## 2021-08-27 DIAGNOSIS — Z79899 Other long term (current) drug therapy: Secondary | ICD-10-CM | POA: Diagnosis not present

## 2021-08-27 DIAGNOSIS — Z9049 Acquired absence of other specified parts of digestive tract: Secondary | ICD-10-CM | POA: Diagnosis not present

## 2021-08-27 DIAGNOSIS — Z2821 Immunization not carried out because of patient refusal: Secondary | ICD-10-CM | POA: Diagnosis not present

## 2021-08-27 DIAGNOSIS — K5000 Crohn's disease of small intestine without complications: Secondary | ICD-10-CM | POA: Diagnosis not present

## 2021-09-03 ENCOUNTER — Telehealth: Payer: Self-pay | Admitting: Gastroenterology

## 2021-09-03 NOTE — Telephone Encounter (Signed)
Received request for Cimzia refill on my desk. Patient last seen by [facility] 08/2020. We referred him to [facility], seen 04/2021. Has had labs and MRE which I have reviewed. ? ?Looks like he has been placed on entocort and has pending appointment with surgery. ? ?I refilled this medication in October for Cimzia, see telephone note on October 7.  I requested an office visit with Dr. Abbey Chatters but it looks like this was not scheduled.  ? ?We need to find out if patient has had any change in his Crohn's medications.  He will need to follow-up with [place] Abbey Chatters only) if we continue to write the prescription however if he prefers we can request Dr. Rolly Salter group to prescribe Cimzia since they are actively seeing him right now. ?

## 2021-09-04 NOTE — Telephone Encounter (Signed)
Lmom for pt to return my call.  

## 2021-09-07 NOTE — Telephone Encounter (Signed)
Form for Cimzia completed for refills. Please fax. ?

## 2021-09-07 NOTE — Telephone Encounter (Signed)
Form for Cimzia was faxed.  ?

## 2021-09-07 NOTE — Telephone Encounter (Signed)
Spoke with pt and he states that he is still getting cimzia from [facility] through [place]. Pt wants [place] to continue to handle that so that nothing gets messed up in the process of changing it over to a new provider. Will route to the front to get pt an appt with Dr. Abbey Chatters only. Pt states that Dr. Nyoka Cowden is talking about the possibility of changing his crohns medication but has not as of yet. Pt has started taking budesonide 3 mg. Pt takes 3 capsules daily.  ?

## 2021-09-08 NOTE — Telephone Encounter (Signed)
Pt is aware of OV with Dr Abbey Chatters on 09/10/2021 at 1030 ?

## 2021-09-10 ENCOUNTER — Ambulatory Visit (INDEPENDENT_AMBULATORY_CARE_PROVIDER_SITE_OTHER): Payer: Medicare Other | Admitting: Internal Medicine

## 2021-09-10 VITALS — BP 110/72 | HR 88 | Temp 97.1°F | Ht 69.0 in | Wt 139.4 lb

## 2021-09-10 DIAGNOSIS — K50812 Crohn's disease of both small and large intestine with intestinal obstruction: Secondary | ICD-10-CM

## 2021-09-10 NOTE — Progress Notes (Signed)
? ? ?Referring Provider: Caryl Bis, MD ?Primary Care Physician:  Caryl Bis, MD ?Primary GI:  Dr. Abbey Chatters ? ?Chief Complaint  ?Patient presents with  ? Follow-up  ?  Pt need further refills on medications  ? ? ?HPI:   ?Joshua Robertson is a 46 y.o. male who presents to clinic today for follow-up visit.  Has not been seen in our clinic in 1 year.  Has a history of ileal Crohn's disease diagnosed at age 74 with complications of GI stricture status post ileocecectomy April 2018.  Currently maintained on Cimzia.  Previously on Humira with no response, was on Remicade for a while which did seem to help though.  To lose response a few years later.  Reportedly on Entyvio as well at 1 point. ? ?Last colonoscopy November 2021 with inflammation/ulceration of the anastomosis.  Biopsies showed chronic active nonspecific ileitis with ulceration. ? ?We referred him to [facility] for further management.  Currently being followed by Dr. Nyoka Cowden.  MRE on 05/13/2021 showed findings compatible with active on chronic inflammatory changes of the neoterminal ileum with signs of fibro stenosing changes. ? ?Currently on Entocort with plans for Cimzia levels to be checked for next dose.  Also has appointment to see general surgeon at [facility] in April. ? ?Overall he states he is doing okay today.  Notes 3-4 bowel movements a week.  Denies any hematochezia or mucus. ? ?Past Medical History:  ?Diagnosis Date  ? BMI between 19-24,adult JAN 2010 149 LBS  ? Crohn's disease ([place]) AUG 2009 NUR [facility]  ? HB 9.4 FERRITIN 26  140 LBS; no serologies-pt can't afford test  ? Helicobacter pylori gastritis 2009  ? Hypertension   ? Migraine headache   ? NONE SINCE AGE 77  ? Sacroiliitis ([place]) 2009  ? Sickle cell trait ([place])   ? Spondyloarthropathy 2009 EROSIVE  ? ? ?Past Surgical History:  ?Procedure Laterality Date  ? BACK SURGERY    ? 2018  outpatient x2  ? BIOPSY  09/06/2017  ? Procedure: BIOPSY;  Surgeon: [name], MD;  [facility];  Service: Endoscopy;;  colon ?ileum  ? BIOPSY  04/18/2020  ? Procedure: BIOPSY;  Surgeon: Eloise Harman, DO;  [facility];  Service: Endoscopy;;  ? COLON RESECTION    ? due to chrohn's  ? COLONOSCOPY  03/2015  ? [facility]: Crohn's like stricture at ICV. Stricture was not traversable. abnormal mucosa of ICV. Bx, c/w Crohn's.   ? COLONOSCOPY N/A 07/30/2013  ? stenotic IC VALVE. UNABLE TO INTUBATE ILEUM.  ? COLONOSCOPY WITH PROPOFOL N/A 09/06/2017  ? Procedure: COLONOSCOPY WITH PROPOFOL;  Surgeon: [name], MD;  [facility];  Service: Endoscopy;  Laterality: N/A;  7:30am  ? COLONOSCOPY WITH PROPOFOL N/A 04/18/2020  ? Dr. Abbey Chatters: Nonbleeding internal hemorrhoids, patent end-to-side ileocolonic anastomosis characterized by erosion, erythema, inflammation and ulceration.  Biopsies showed nonspecific chronic inflammation at the anastomosis.  ? HEMORRHOID SURGERY N/A 08/01/2017  ? Procedure: HEMORRHOIDECTOMY;  Surgeon: Aviva Signs, MD;  Location: [facility] ORS;  Service: General;  Laterality: N/A;  ? LUMBAR LAMINECTOMY/DECOMPRESSION MICRODISCECTOMY Right 02/04/2017  ? Procedure: Microdiscectomy - Lumbar four-five - right redo;  Surgeon: Kary Kos, MD;  Location: [facility];  Service: Neurosurgery;  Laterality: Right;  ? ? ?Current Outpatient Medications  ?Medication Sig Dispense Refill  ? acetaminophen (TYLENOL) 500 MG tablet Take 500-1,000 mg by mouth every 6 (six) hours as needed (for pain).    ?  cholecalciferol (VITAMIN D) 1000 units tablet Take 1 tablet (1,000 Units total) by mouth daily. BEGIN AFTER WEEKLY VIT D DOSING IS COMPLETE (Patient taking differently: Take 1,000 Units by mouth 3 (three) times a week.) 30 tablet 11  ? CIMZIA PREFILLED 2 X 200 MG/ML KIT Inject 400 mg into the skin every 28 (twenty-eight) days. 1 kit 5  ? cloNIDine (CATAPRES) 0.1 MG tablet Take 1 tablet (0.1 mg total) by mouth 2 (two) times daily. 60 tablet 11  ? Multiple Vitamin (MULTIVITAMIN WITH  MINERALS) TABS tablet Take 1 tablet by mouth daily. One-A-Day for Men    ? ondansetron (ZOFRAN-ODT) 4 MG disintegrating tablet DISSOLVE 1 TABLET IN MOUTH EVERY 8 HOURS AS NEEDED FOR NAUSEA OR FOR VOMITING 30 tablet 0  ? ?No current facility-administered medications for this visit.  ? ? ?Allergies as of 09/10/2021 - Review Complete 09/11/2020  ?Allergen Reaction Noted  ? Penicillins Swelling and Rash 08/15/2008  ? Azathioprine Nausea And Vomiting 11/25/2009  ? Iodinated contrast media Nausea And Vomiting 08/20/2013  ? Metrizamide Nausea And Vomiting 08/20/2013  ? Pentasa [mesalamine er] Rash 07/11/2013  ? ? ?Family History  ?Problem Relation Age of Onset  ? Hypertension Mother   ? Hypertension Father   ? Colon cancer Neg Hx   ? Colon polyps Neg Hx   ? Inflammatory bowel disease Neg Hx   ? ? ?Social History  ? ?Socioeconomic History  ? Marital status: Single  ?  Spouse name: Not on file  ? Number of children: Not on file  ? Years of education: Not on file  ? Highest education level: Not on file  ?Occupational History  ? Occupation: unemployed  ?Tobacco Use  ? Smoking status: Every Day  ?  Packs/day: 0.25  ?  Types: Cigarettes  ? Smokeless tobacco: Never  ? Tobacco comments:  ?  6 cig daily as of 07/29/2017  ?Vaping Use  ? Vaping Use: Never used  ?Substance and Sexual Activity  ? Alcohol use: Yes  ?  Alcohol/week: 0.0 standard drinks  ?  Comment: very rare  ? Drug use: Yes  ?  Types: Marijuana  ?  Comment: 2-3 times daily Helps appetite and stomach.  ? Sexual activity: Yes  ?  Birth control/protection: None  ?Other Topics Concern  ? Not on file  ?Social History Narrative  ? Smokes cigs  & pot. Has one girl AGE 85: DESTINY.  ? ?Social Determinants of Health  ? ?Financial Resource Strain: Not on file  ?Food Insecurity: Not on file  ?Transportation Needs: Not on file  ?Physical Activity: Not on file  ?Stress: Not on file  ?Social Connections: Not on file  ? ? ?Subjective: ?Review of Systems  ?Constitutional:  Negative for  chills and fever.  ?HENT:  Negative for congestion and hearing loss.   ?Eyes:  Negative for blurred vision and double vision.  ?Respiratory:  Negative for cough and shortness of breath.   ?Cardiovascular:  Negative for chest pain and palpitations.  ?Gastrointestinal:  Negative for abdominal pain, blood in stool, constipation, diarrhea, heartburn, melena and vomiting.  ?Genitourinary:  Negative for dysuria and urgency.  ?Musculoskeletal:  Positive for back pain. Negative for joint pain and myalgias.  ?Skin:  Negative for itching and rash.  ?Neurological:  Negative for dizziness and headaches.  ?Psychiatric/Behavioral:  Negative for depression. The patient is not nervous/anxious.   ? ? ?Objective: ?BP 110/72   Pulse 88   Temp (!) 97.1 ?F (36.2 ?C)   Ht 5'  9" (1.753 m)   Wt 139 lb 6.4 oz (63.2 kg)   BMI 20.59 kg/m?  ?Physical Exam ?Constitutional:   ?   Appearance: Normal appearance.  ?HENT:  ?   Head: Normocephalic and atraumatic.  ?Eyes:  ?   Extraocular Movements: Extraocular movements intact.  ?   Conjunctiva/sclera: Conjunctivae normal.  ?Cardiovascular:  ?   Rate and Rhythm: Normal rate and regular rhythm.  ?Pulmonary:  ?   Effort: Pulmonary effort is normal.  ?   Breath sounds: Normal breath sounds.  ?Abdominal:  ?   General: Bowel sounds are normal.  ?   Palpations: Abdomen is soft.  ?Musculoskeletal:     ?   General: Normal range of motion.  ?   Cervical back: Normal range of motion and neck supple.  ?Skin: ?   General: Skin is warm.  ?Neurological:  ?   General: No focal deficit present.  ?   Mental Status: He is alert and oriented to person, place, and time.  ?Psychiatric:     ?   Mood and Affect: Mood normal.     ?   Behavior: Behavior normal.  ? ? ? ?Assessment: ?*Ileal Crohn's disease ? ?Plan: ?Patient currently being followed by Dr. Nyoka Cowden at [facility].  Appreciate his help immensely with this patient. ? ?Discussed with patient that I am available to assist in any way that I can  from a local standpoint but that Dr. Nyoka Cowden is his primary physician now in regards to managing his Crohn's disease. ? ?Cimzia has been refilled by our office.  We are happy to continue to refill this medication

## 2021-09-10 NOTE — Patient Instructions (Signed)
I am happy to hear that you are feel better.  Continue to follow-up with [facility] for your Crohn's management.  I am happy to assist in any way that I can.  I will put a follow-up visit for 6 months to come back and see me. ? ?It was nice seeing you again today. ? ?Dr. Abbey Chatters ? ?At [facility] we value your feedback. You may receive a survey about your visit today. Please share your experience as we strive to create trusting relationships with our patients to provide genuine, compassionate, quality care. ? ?We appreciate your understanding and patience as we review any laboratory studies, imaging, and other diagnostic tests that are ordered as we care for you. Our office policy is 5 business days for review of these results, and any emergent or urgent results are addressed in a timely manner for your best interest. If you do not hear from our office in 1 week, please contact us.  ? ?We also encourage the use of MyChart, which contains your medical information for your review as well. If you are not enrolled in this feature, an access code is on this after visit summary for your convenience. Thank you for allowing [place] to be involved in your care. ? ?It was great to see you today!  I hope you have a great rest of your Spring! ? ? ? ?Joshua Robertson. Abbey Chatters, D.O. ?Gastroenterology and Hepatology ?[facility] ? ?

## 2021-09-16 DIAGNOSIS — K50919 Crohn's disease, unspecified, with unspecified complications: Secondary | ICD-10-CM | POA: Diagnosis not present

## 2021-09-17 DIAGNOSIS — K50111 Crohn's disease of large intestine with rectal bleeding: Secondary | ICD-10-CM | POA: Diagnosis not present

## 2021-09-20 ENCOUNTER — Other Ambulatory Visit: Payer: Self-pay

## 2021-09-20 ENCOUNTER — Encounter (HOSPITAL_COMMUNITY): Payer: Self-pay

## 2021-09-20 ENCOUNTER — Inpatient Hospital Stay (HOSPITAL_COMMUNITY)
Admission: EM | Admit: 2021-09-20 | Discharge: 2021-09-22 | DRG: 385 | Disposition: A | Discharge status: Home / Self Care | Payer: Medicare Other | Attending: Internal Medicine | Admitting: Internal Medicine

## 2021-09-20 ENCOUNTER — Emergency Department (HOSPITAL_COMMUNITY): Payer: Medicare Other

## 2021-09-20 DIAGNOSIS — Z888 Allergy status to other drugs, medicaments and biological substances status: Secondary | ICD-10-CM | POA: Diagnosis not present

## 2021-09-20 DIAGNOSIS — Z9049 Acquired absence of other specified parts of digestive tract: Secondary | ICD-10-CM

## 2021-09-20 DIAGNOSIS — Z91041 Radiographic dye allergy status: Secondary | ICD-10-CM | POA: Diagnosis not present

## 2021-09-20 DIAGNOSIS — I10 Essential (primary) hypertension: Secondary | ICD-10-CM | POA: Diagnosis not present

## 2021-09-20 DIAGNOSIS — D72829 Elevated white blood cell count, unspecified: Secondary | ICD-10-CM | POA: Diagnosis present

## 2021-09-20 DIAGNOSIS — A09 Infectious gastroenteritis and colitis, unspecified: Secondary | ICD-10-CM | POA: Diagnosis present

## 2021-09-20 DIAGNOSIS — F17210 Nicotine dependence, cigarettes, uncomplicated: Secondary | ICD-10-CM | POA: Diagnosis present

## 2021-09-20 DIAGNOSIS — K50118 Crohn's disease of large intestine with other complication: Principal | ICD-10-CM | POA: Diagnosis present

## 2021-09-20 DIAGNOSIS — Z8249 Family history of ischemic heart disease and other diseases of the circulatory system: Secondary | ICD-10-CM

## 2021-09-20 DIAGNOSIS — F1290 Cannabis use, unspecified, uncomplicated: Secondary | ICD-10-CM

## 2021-09-20 DIAGNOSIS — K529 Noninfective gastroenteritis and colitis, unspecified: Secondary | ICD-10-CM | POA: Diagnosis present

## 2021-09-20 DIAGNOSIS — K5090 Crohn's disease, unspecified, without complications: Secondary | ICD-10-CM | POA: Diagnosis not present

## 2021-09-20 DIAGNOSIS — R1030 Lower abdominal pain, unspecified: Secondary | ICD-10-CM | POA: Diagnosis not present

## 2021-09-20 DIAGNOSIS — K50812 Crohn's disease of both small and large intestine with intestinal obstruction: Secondary | ICD-10-CM | POA: Diagnosis not present

## 2021-09-20 DIAGNOSIS — Z79899 Other long term (current) drug therapy: Secondary | ICD-10-CM

## 2021-09-20 DIAGNOSIS — K631 Perforation of intestine (nontraumatic): Secondary | ICD-10-CM | POA: Diagnosis present

## 2021-09-20 DIAGNOSIS — R109 Unspecified abdominal pain: Secondary | ICD-10-CM | POA: Diagnosis not present

## 2021-09-20 DIAGNOSIS — Z880 Allergy status to penicillin: Secondary | ICD-10-CM

## 2021-09-20 DIAGNOSIS — K50019 Crohn's disease of small intestine with unspecified complications: Secondary | ICD-10-CM

## 2021-09-20 DIAGNOSIS — K567 Ileus, unspecified: Secondary | ICD-10-CM | POA: Diagnosis not present

## 2021-09-20 DIAGNOSIS — D573 Sickle-cell trait: Secondary | ICD-10-CM | POA: Diagnosis present

## 2021-09-20 LAB — URINALYSIS, ROUTINE W REFLEX MICROSCOPIC
Bilirubin Urine: NEGATIVE
Glucose, UA: NEGATIVE mg/dL
Hgb urine dipstick: NEGATIVE
Ketones, ur: NEGATIVE mg/dL
Leukocytes,Ua: NEGATIVE
Nitrite: NEGATIVE
Protein, ur: NEGATIVE mg/dL
Specific Gravity, Urine: 1.012 (ref 1.005–1.030)
pH: 7 (ref 5.0–8.0)

## 2021-09-20 LAB — COMPREHENSIVE METABOLIC PANEL
ALT: 12 U/L (ref 0–44)
AST: 16 U/L (ref 15–41)
Albumin: 4.2 g/dL (ref 3.5–5.0)
Alkaline Phosphatase: 68 U/L (ref 38–126)
Anion gap: 12 (ref 5–15)
BUN: 13 mg/dL (ref 6–20)
CO2: 25 mmol/L (ref 22–32)
Calcium: 9.4 mg/dL (ref 8.9–10.3)
Chloride: 102 mmol/L (ref 98–111)
Creatinine, Ser: 0.83 mg/dL (ref 0.61–1.24)
GFR, Estimated: >60 mL/min (ref >60)
Glucose, Bld: 127 mg/dL — ABNORMAL HIGH (ref 70–99)
Potassium: 3.6 mmol/L (ref 3.5–5.1)
Sodium: 139 mmol/L (ref 135–145)
Total Bilirubin: 0.8 mg/dL (ref 0.3–1.2)
Total Protein: 8.2 g/dL — ABNORMAL HIGH (ref 6.5–8.1)

## 2021-09-20 LAB — CBC
HCT: 37.6 % — ABNORMAL LOW (ref 39.0–52.0)
Hemoglobin: 12.6 g/dL — ABNORMAL LOW (ref 13.0–17.0)
MCH: 22.8 pg — ABNORMAL LOW (ref 26.0–34.0)
MCHC: 33.5 g/dL (ref 30.0–36.0)
MCV: 68.1 fL — ABNORMAL LOW (ref 80.0–100.0)
Platelets: 454 10*3/uL — ABNORMAL HIGH (ref 150–400)
RBC: 5.52 MIL/uL (ref 4.22–5.81)
RDW: 19.9 % — ABNORMAL HIGH (ref 11.5–15.5)
WBC: 24.8 10*3/uL — ABNORMAL HIGH (ref 4.0–10.5)
nRBC: 0 % (ref 0.0–0.2)

## 2021-09-20 LAB — HIV ANTIBODY (ROUTINE TESTING W REFLEX): HIV Screen 4th Generation wRfx: NONREACTIVE

## 2021-09-20 LAB — LACTIC ACID, PLASMA
Lactic Acid, Venous: 0.7 mmol/L (ref 0.5–1.9)
Lactic Acid, Venous: 0.7 mmol/L (ref 0.5–1.9)

## 2021-09-20 LAB — LIPASE, BLOOD: Lipase: 28 U/L (ref 11–51)

## 2021-09-20 MED ORDER — CLONIDINE HCL 0.1 MG PO TABS
0.1000 mg | ORAL_TABLET | Freq: Two times a day (BID) | ORAL | Status: DC
  Administered 2021-09-20 – 2021-09-22 (×4): 0.1 mg via ORAL
  Filled 2021-09-20 (×4): qty 1

## 2021-09-20 MED ORDER — ACETAMINOPHEN 325 MG PO TABS: 650.0000 mg | ORAL_TABLET | Freq: Four times a day (QID) | ORAL | Status: DC | PRN

## 2021-09-20 MED ORDER — AMLODIPINE BESYLATE 5 MG PO TABS
10.0000 mg | ORAL_TABLET | Freq: Every day | ORAL | Status: DC
  Administered 2021-09-20 – 2021-09-22 (×3): 10 mg via ORAL
  Filled 2021-09-20 (×3): qty 2

## 2021-09-20 MED ORDER — OXYCODONE HCL 5 MG PO TABS: 5.0000 mg | ORAL_TABLET | ORAL | Status: DC | PRN

## 2021-09-20 MED ORDER — HYDROMORPHONE HCL 1 MG/ML IJ SOLN
1.0000 mg | Freq: Once | INTRAMUSCULAR | Status: AC
  Administered 2021-09-20: 1 mg via INTRAVENOUS
  Filled 2021-09-20: qty 1

## 2021-09-20 MED ORDER — PREDNISONE 20 MG PO TABS
50.0000 mg | ORAL_TABLET | Freq: Four times a day (QID) | ORAL | Status: DC
  Administered 2021-09-20 – 2021-09-21 (×3): 50 mg via ORAL
  Filled 2021-09-20 (×3): qty 1

## 2021-09-20 MED ORDER — ONDANSETRON HCL 4 MG PO TABS: 4.0000 mg | ORAL_TABLET | Freq: Four times a day (QID) | ORAL | Status: DC | PRN

## 2021-09-20 MED ORDER — CIPROFLOXACIN IN D5W 400 MG/200ML IV SOLN
400.0000 mg | Freq: Two times a day (BID) | INTRAVENOUS | Status: DC
  Administered 2021-09-21 – 2021-09-22 (×3): 400 mg via INTRAVENOUS
  Filled 2021-09-20 (×3): qty 200

## 2021-09-20 MED ORDER — ONDANSETRON HCL 4 MG/2ML IJ SOLN
4.0000 mg | Freq: Once | INTRAMUSCULAR | Status: AC | PRN
  Administered 2021-09-20: 4 mg via INTRAVENOUS
  Filled 2021-09-20: qty 2

## 2021-09-20 MED ORDER — METRONIDAZOLE 500 MG/100ML IV SOLN
500.0000 mg | Freq: Two times a day (BID) | INTRAVENOUS | Status: DC
Start: 2021-09-20 — End: 2021-09-22
  Administered 2021-09-20 – 2021-09-22 (×4): 500 mg via INTRAVENOUS
  Filled 2021-09-20 (×4): qty 100

## 2021-09-20 MED ORDER — HYDROMORPHONE HCL 1 MG/ML IJ SOLN
1.0000 mg | Freq: Once | INTRAMUSCULAR | Status: AC
Start: 2021-09-20 — End: 2021-09-20
  Administered 2021-09-20: 1 mg via INTRAVENOUS
  Filled 2021-09-20: qty 1

## 2021-09-20 MED ORDER — CIPROFLOXACIN IN D5W 400 MG/200ML IV SOLN
400.0000 mg | Freq: Once | INTRAVENOUS | Status: AC
  Administered 2021-09-20: 400 mg via INTRAVENOUS
  Filled 2021-09-20: qty 200

## 2021-09-20 MED ORDER — DIPHENHYDRAMINE HCL 50 MG/ML IJ SOLN
50.0000 mg | Freq: Once | INTRAMUSCULAR | Status: AC
  Administered 2021-09-21: 50 mg via INTRAVENOUS
  Filled 2021-09-20: qty 1

## 2021-09-20 MED ORDER — HEPARIN SODIUM (PORCINE) 5000 UNIT/ML IJ SOLN
5000.0000 [IU] | Freq: Three times a day (TID) | INTRAMUSCULAR | Status: DC
  Administered 2021-09-20 – 2021-09-22 (×4): 5000 [IU] via SUBCUTANEOUS
  Filled 2021-09-20 (×5): qty 1

## 2021-09-20 MED ORDER — SODIUM CHLORIDE 0.9 % IV BOLUS
1000.0000 mL | Freq: Once | INTRAVENOUS | Status: AC
  Administered 2021-09-20: 1000 mL via INTRAVENOUS

## 2021-09-20 MED ORDER — SODIUM CHLORIDE 0.9 % IV SOLN: INTRAVENOUS | Status: DC

## 2021-09-20 MED ORDER — ONDANSETRON HCL 4 MG/2ML IJ SOLN
4.0000 mg | Freq: Four times a day (QID) | INTRAMUSCULAR | Status: DC | PRN
Start: 2021-09-20 — End: 2021-09-22
  Administered 2021-09-21: 4 mg via INTRAVENOUS
  Filled 2021-09-20: qty 2

## 2021-09-20 MED ORDER — DIPHENHYDRAMINE HCL 50 MG/ML IJ SOLN: 50.0000 mg | Freq: Once | INTRAMUSCULAR | Status: AC

## 2021-09-20 MED ORDER — BUDESONIDE 3 MG PO CPEP
9.0000 mg | ORAL_CAPSULE | Freq: Every day | ORAL | Status: DC
  Administered 2021-09-20: 9 mg via ORAL
  Filled 2021-09-20: qty 3

## 2021-09-20 MED ORDER — DIPHENHYDRAMINE HCL 25 MG PO CAPS
50.0000 mg | ORAL_CAPSULE | Freq: Once | ORAL | Status: AC
  Administered 2021-09-20: 50 mg via ORAL
  Filled 2021-09-20: qty 2

## 2021-09-20 MED ORDER — MORPHINE SULFATE (PF) 2 MG/ML IV SOLN
2.0000 mg | INTRAVENOUS | Status: DC | PRN
  Administered 2021-09-20 – 2021-09-21 (×5): 2 mg via INTRAVENOUS
  Filled 2021-09-20 (×5): qty 1

## 2021-09-20 MED ORDER — ACETAMINOPHEN 650 MG RE SUPP: 650.0000 mg | Freq: Four times a day (QID) | RECTAL | Status: DC | PRN

## 2021-09-20 NOTE — Assessment & Plan Note (Addendum)
-   Continue clonidine and amlodipine ?

## 2021-09-20 NOTE — ED Triage Notes (Signed)
Pt arrives via POV. Pt c/o abdominal pain, nausea, and vomiting since last night. Hx of chron's.  ?

## 2021-09-20 NOTE — Assessment & Plan Note (Signed)
-   White blood cell count 24.8 ?-Cimzia is known to cause a leukocytosis, but more likely infectious in the setting of enteritis on CT scan ?-Pro-Cal pending ?-Continue Cipro Flagyl ?-Trend in the a.m. ?

## 2021-09-20 NOTE — Progress Notes (Signed)
[facility] ? ?Possible extraluminal air? In setting of Crohn?s flare. Keep NPO. IV antibiotics. Would hold on any surgery right now unless clinically worsens. Needs more tertiary care management if continues to have issues.  ? ?Will need CT po contrast, and iv contrast with premedication if needed, to further assess before any diet. Would hold on that tomorrow.  ? ?Curlene Labrum, MD ? ?

## 2021-09-20 NOTE — Assessment & Plan Note (Signed)
-   Inflammatory versus infectious enteritis on CT ?-Favoring infectious given leukocytosis 24.8 ?-Continue Cipro and Flagyl ?-Procalcitonin pending ?-GI consulted from the ED and recommends admit for [facility] ?-Given some gas bubbles that were hard to tell if they were extra or intraluminal -General surgery was consulted and recommends to treat with antibiotics overnight she will consult in the a.m. ?-Ileus is seen which is compatible with inflammatory stricture ?-Continue pain control ?Continue to monitor ? ?

## 2021-09-20 NOTE — H&P (Signed)
?History and Physical  ? ? ?Patient: Joshua Robertson JWJ:[id number] DOB: [birth year] ?DOA: 09/20/2021 ?DOS: the patient was seen and examined on 09/20/2021 ?PCP: Caryl Bis, MD  ?Patient coming from: Home ? ?Chief Complaint:  ?Chief Complaint  ?Patient presents with  ? Abdominal Pain  ? Nausea  ? Emesis  ? ?HPI: Joshua Robertson is a 46 y.o. male with medical history significant of Crohn's disease, status post ileocecectomy with anastomosis, hypertension, migraines, sickle cell trait, sacroiliitis, spondyloarthropathy, and more presents ED with a chief complaint of sudden onset nausea, vomiting, lower right quadrant pain yesterday.  Patient reports his last normal meal was lunch time yesterday.  He was able to keep it down.  He has been nauseous and had 1 episode of nonbloody emesis this morning.  He denies any fevers, and diarrhea.  Vomiting helped his abdominal pain but did not resolve it.  Patient reports that his ileocecectomy was done in 2018 at [facility]  He now follows with GI at [facility].  He has not had any postop complications.  Patient reports he is on Cimzia and his last treatment was April 7.  Patient describes his pain as sharp, burning, and intense.  He has no further complaints at this time. ? ?Reports he has not smoked a cigarette in 2 days, and denies nicotine patch at this time.  He does not drink alcohol.  He does use marijuana with his last use yesterday.  No history of cyclic vomiting syndrome that he knows of.  Patient no further complaints at this time.  Patient is vaccinated for COVID.  Patient is full code. ?Review of Systems: As mentioned in the history of present illness. All other systems reviewed and are negative. ?Past Medical History:  ?Diagnosis Date  ? BMI between 19-24,adult JAN 2010 149 LBS  ? Crohn's disease ([place]) AUG 2009 NUR [facility]  ? HB 9.4 FERRITIN 26  140 LBS; no serologies-pt can't afford test  ? Helicobacter pylori gastritis 2009  ? Hypertension   ? Migraine headache   ? NONE SINCE  AGE 68  ? Sacroiliitis ([place]) 2009  ? Sickle cell trait ([place])   ? Spondyloarthropathy 2009 EROSIVE  ? ?Past Surgical History:  ?Procedure Laterality Date  ? BACK SURGERY    ? 2018  outpatient x2  ? BIOPSY  09/06/2017  ? Procedure: BIOPSY;  Surgeon: [name], MD;  [facility];  Service: Endoscopy;;  colon ?ileum  ? BIOPSY  04/18/2020  ? Procedure: BIOPSY;  Surgeon: Eloise Harman, DO;  [facility];  Service: Endoscopy;;  ? COLON RESECTION    ? due to chrohn's  ? COLONOSCOPY  03/2015  ? [facility]: Crohn's like stricture at ICV. Stricture was not traversable. abnormal mucosa of ICV. Bx, c/w Crohn's.   ? COLONOSCOPY N/A 07/30/2013  ? stenotic IC VALVE. UNABLE TO INTUBATE ILEUM.  ? COLONOSCOPY WITH PROPOFOL N/A 09/06/2017  ? Procedure: COLONOSCOPY WITH PROPOFOL;  Surgeon: [name], MD;  [facility];  Service: Endoscopy;  Laterality: N/A;  7:30am  ? COLONOSCOPY WITH PROPOFOL N/A 04/18/2020  ? Dr. Abbey Chatters: Nonbleeding internal hemorrhoids, patent end-to-side ileocolonic anastomosis characterized by erosion, erythema, inflammation and ulceration.  Biopsies showed nonspecific chronic inflammation at the anastomosis.  ? HEMORRHOID SURGERY N/A 08/01/2017  ? Procedure: HEMORRHOIDECTOMY;  Surgeon: Aviva Signs, MD;  Location: [facility];  Service: General;  Laterality: N/A;  ? LUMBAR LAMINECTOMY/DECOMPRESSION MICRODISCECTOMY Right 02/04/2017  ? Procedure: Microdiscectomy - Lumbar four-five - right  redo;  Surgeon: Kary Kos, MD;  Location: [facility];  Service: Neurosurgery;  Laterality: Right;  ? ?Social History:  reports that he has been smoking cigarettes. He has been smoking an average of .25 packs per day. He has never used smokeless tobacco. He reports current alcohol use. He reports current drug use. Drug: Marijuana. ? ?Allergies  ?Allergen Reactions  ? Penicillins Swelling and Rash  ?  PATIENT HAS HAD A PCN REACTION WITH IMMEDIATE RASH, FACIAL/TONGUE/THROAT SWELLING, SOB, OR  LIGHTHEADEDNESS WITH HYPOTENSION:  #  #  #  YES  #  #  #   ?HAS PT DEVELOPED SEVERE RASH INVOLVING MUCUS MEMBRANES or SKIN NECROSIS: #  #  #  YES  #  #  #  ?Has patient had a PCN reaction that required hospitalization No ?Has patient had a PCN reaction occurring within the last 10 years: No ?If all of the above answers are "NO", then may proceed with Cephalosporin use. ?  ? Azathioprine Nausea And Vomiting  ? Iodinated Contrast Media Nausea And Vomiting  ? Metrizamide Nausea And Vomiting  ? Pentasa [Mesalamine Er] Rash  ? ? ?Family History  ?Problem Relation Age of Onset  ? Hypertension Mother   ? Hypertension Father   ? Colon cancer Neg Hx   ? Colon polyps Neg Hx   ? Inflammatory bowel disease Neg Hx   ? ? ?Prior to Admission medications   ?Medication Sig Start Date End Date Taking? Authorizing Provider  ?acetaminophen (TYLENOL) 500 MG tablet Take 500-1,000 mg by mouth every 6 (six) hours as needed (for pain).   Yes [provider]  ?amLODipine (NORVASC) 10 MG tablet Take 1 tablet by mouth daily. 09/12/20  Yes [provider]  ?budesonide (ENTOCORT EC) 3 MG 24 hr capsule Take 9 mg by mouth daily. 08/27/21  Yes [provider]  ?cholecalciferol (VITAMIN D) 1000 units tablet Take 1 tablet (1,000 Units total) by mouth daily. BEGIN AFTER WEEKLY VIT D DOSING IS COMPLETE ?Patient taking differently: Take 1,000 Units by mouth 3 (three) times a week. 04/11/16  Yes Fields, Marga Melnick, MD  ?CIMZIA PREFILLED 2 X 200 MG/ML KIT Inject 400 mg into the skin every 28 (twenty-eight) days. 10/01/20  Yes Mahala Menghini, PA-C  ?cloNIDine (CATAPRES) 0.1 MG tablet Take 1 tablet (0.1 mg total) by mouth 2 (two) times daily. 08/09/12  Yes Fields, Marga Melnick, MD  ?diclofenac Sodium (VOLTAREN) 1 % GEL 4g   Yes [provider]  ?docusate sodium (COLACE) 100 MG capsule Take 100 mg by mouth daily as needed for mild constipation.   Yes [provider]  ?Multiple Vitamin (MULTIVITAMIN WITH MINERALS) TABS tablet  Take 1 tablet by mouth daily. One-A-Day for Men   Yes [provider]  ?ondansetron (ZOFRAN-ODT) 4 MG disintegrating tablet DISSOLVE 1 TABLET IN MOUTH EVERY 8 HOURS AS NEEDED FOR NAUSEA OR FOR VOMITING ?Patient not taking: Reported on 09/20/2021 03/09/21   Erenest Rasher, PA-C  ? ? ?Physical Exam: ?Vitals:  ? 09/20/21 1545 09/20/21 1600 09/20/21 1645 09/20/21 1700  ?BP:  (!) 144/91  (!) 146/99  ?Pulse: 86 79 79 83  ?Resp:    18  ?Temp:    99 ?F (37.2 ?C)  ?TempSrc:    Oral  ?SpO2: 97% 97% 100% 99%  ?Weight:      ?Height:      ? ?1.  General: ?Patient lying supine in bed,  no acute distress ?  ?2. Psychiatric: ?Alert and oriented x  3, mood and behavior normal for situation, pleasant and cooperative with exam ?  ?3. Neurologic: ?Speech and language are normal, face is symmetric, moves all 4 extremities voluntarily, at baseline without acute deficits on limited exam ?  ?4. HEENMT:  ?Head is atraumatic, normocephalic, pupils reactive to light, neck is supple, trachea is midline, mucous membranes are moist ?  ?5. Respiratory : ?Lungs are clear to auscultation bilaterally without wheezing, rhonchi, rales, no cyanosis, no increase in work of breathing or accessory muscle use ?  ?6. Cardiovascular : ?Heart rate normal, rhythm is regular, no murmurs, rubs or gallops, no peripheral edema, peripheral pulses palpated ?  ?7. Gastrointestinal:  ?Abdomen is soft, nondistended, exquisitely tender in the right lower quadrant, minimally tender in the other 3 quadrants, bowel sounds active, no masses or organomegaly palpated ?  ?8. Skin:  ?Skin is warm, dry and intact without rashes, acute lesions, or ulcers on limited exam ?  ?9.Musculoskeletal:  ?No acute deformities or trauma, no asymmetry in tone, no peripheral edema, peripheral pulses palpated, no tenderness to palpation in the extremities ? ?Data Reviewed: ? ?In the ED ?Temp 98.1, heart rate 79-85, respiratory rate 18-19, blood pressure 148 over 92, satting  96% ?Leukocytosis 24.8, hemoglobin 12.6, platelets 454 ?Chemistry panel unremarkable ?UA is negative ?CT abdomen pelvis shows edema/inflammation of the right lower quadrant status post ileocecectomy with ileocolic

## 2021-09-20 NOTE — Assessment & Plan Note (Addendum)
-   Reports marijuana daily use ?-Cyclic vomiting syndrome a consideration ?

## 2021-09-20 NOTE — Assessment & Plan Note (Signed)
-   Last Cimzia dose April 7 ?-Continue Entocort ?-GI consult in the a.m. ?

## 2021-09-20 NOTE — Assessment & Plan Note (Signed)
Inflammatory versus infectious on CT scan ?-Favoring infectious given leukocytosis, onset and severity of pain,.  Premature age ?-Continue to monitor ?

## 2021-09-20 NOTE — ED Provider Notes (Signed)
?[facility] ?Provider Note ? ? ?CSN: [id number] ?Arrival date & time: 09/20/21  1021 ? ?  ? ?History ? ?Chief Complaint  ?Patient presents with  ? Abdominal Pain  ? Nausea  ? Emesis  ? ? ?Joshua Robertson is a 46 y.o. male. ? ? ?Abdominal Pain ?Associated symptoms: nausea and vomiting   ?Associated symptoms: no chest pain, no diarrhea, no dysuria and no shortness of breath   ?Emesis ?Associated symptoms: abdominal pain   ?Associated symptoms: no diarrhea   ? ?  ? ? ?Joshua Robertson is a 46 y.o. male with past medical history ?of Crohn's, anemia, sickle cell trait, and hypertension who presents to the Emergency Department complaining of onset of diffuse abdominal pain nausea, and one episode of vomiting.  Onset of symptoms last evening.  Symptoms have been persistent since onset.  Abdominal pain is nonradiating.  He has been having bowel movements without significant diarrhea, no hematochezia or mucus.  He denies any fever or chills.  Spouse states that he recently received treatment for his Crohn's last week. Currently on Cimzia ? ?PCP Dr. Olena Heckle in [place] ?Crohn's specialist is Dr. Nyoka Cowden at [facility]. ? ? ? ?Home Medications ?Prior to Admission medications   ?Medication Sig Start Date End Date Taking? Authorizing Provider  ?acetaminophen (TYLENOL) 500 MG tablet Take 500-1,000 mg by mouth every 6 (six) hours as needed (for pain).    [provider]  ?cholecalciferol (VITAMIN D) 1000 units tablet Take 1 tablet (1,000 Units total) by mouth daily. BEGIN AFTER WEEKLY VIT D DOSING IS COMPLETE ?Patient taking differently: Take 1,000 Units by mouth 3 (three) times a week. 04/11/16   Fields, Marga Melnick, MD  ?[facility] PREFILLED 2 X 200 MG/ML KIT Inject 400 mg into the skin every 28 (twenty-eight) days. 10/01/20   Mahala Menghini, PA-C  ?cloNIDine (CATAPRES) 0.1 MG tablet Take 1 tablet (0.1 mg total) by mouth 2 (two) times daily. 08/09/12   Fields, Marga Melnick, MD  ?Multiple Vitamin (MULTIVITAMIN WITH MINERALS) TABS  tablet Take 1 tablet by mouth daily. One-A-Day for Men    [provider]  ?ondansetron (ZOFRAN-ODT) 4 MG disintegrating tablet DISSOLVE 1 TABLET IN MOUTH EVERY 8 HOURS AS NEEDED FOR NAUSEA OR FOR VOMITING 03/09/21   Erenest Rasher, PA-C  ?   ? ?Allergies    ?Penicillins, Azathioprine, Iodinated contrast media, Metrizamide, and Pentasa [mesalamine er]   ? ?Review of Systems   ?Review of Systems  ?Respiratory:  Negative for shortness of breath.   ?Cardiovascular:  Negative for chest pain.  ?Gastrointestinal:  Positive for abdominal pain, nausea and vomiting. Negative for blood in stool and diarrhea.  ?Genitourinary:  Negative for dysuria.  ?Neurological:  Negative for dizziness.  ?All other systems reviewed and are negative. ? ?Physical Exam ?Updated Vital Signs ?BP (!) 155/100   Pulse 85   Temp 98.1 ?F (36.7 ?C) (Oral)   Resp 19   Ht 5' 9"  (1.753 m)   Wt 63.5 kg   SpO2 99%   BMI 20.67 kg/m?  ?Physical Exam ?Vitals and nursing note reviewed.  ?Constitutional:   ?   Comments: Is uncomfortable appearing, nontoxic  ?HENT:  ?   Mouth/Throat:  ?   Comments: Mucous membranes dry ?Eyes:  ?   Conjunctiva/sclera: Conjunctivae normal.  ?Cardiovascular:  ?   Rate and Rhythm: Normal rate and regular rhythm.  ?Pulmonary:  ?   Effort: Pulmonary effort is normal.  ?   Breath sounds: Normal breath sounds.  ?Abdominal:  ?  Palpations: Abdomen is soft. There is no mass.  ?   Tenderness: There is generalized abdominal tenderness.  ?Musculoskeletal:     ?   General: Normal range of motion.  ?   Right lower leg: No edema.  ?   Left lower leg: No edema.  ?Skin: ?   General: Skin is warm.  ?   Findings: No rash.  ?Neurological:  ?   General: No focal deficit present.  ?   Mental Status: He is alert.  ? ? ?ED Results / Procedures / Treatments   ?Labs ?(all labs ordered are listed, but only abnormal results are displayed) ?Labs Reviewed  ?COMPREHENSIVE METABOLIC PANEL - Abnormal; Notable for the following components:  ?     Result Value  ? Glucose, Bld 127 (*)   ? Total Protein 8.2 (*)   ? All other components within normal limits  ?CBC - Abnormal; Notable for the following components:  ? WBC 24.8 (*)   ? Hemoglobin 12.6 (*)   ? HCT 37.6 (*)   ? MCV 68.1 (*)   ? MCH 22.8 (*)   ? RDW 19.9 (*)   ? Platelets 454 (*)   ? All other components within normal limits  ?LIPASE, BLOOD  ?URINALYSIS, ROUTINE W REFLEX MICROSCOPIC  ? ? ?EKG ?None ? ?Radiology ?CT ABDOMEN PELVIS WO CONTRAST ? ?Result Date: 09/20/2021 ?CLINICAL DATA:  Nausea vomiting.  Abdominal pain. EXAM: CT ABDOMEN AND PELVIS WITHOUT CONTRAST TECHNIQUE: Multidetector CT imaging of the abdomen and pelvis was performed following the standard protocol without IV contrast. RADIATION DOSE REDUCTION: This exam was performed according to the departmental dose-optimization program which includes automated exposure control, adjustment of the mA and/or kV according to patient size and/or use of iterative reconstruction technique. COMPARISON:  07/12/2016 FINDINGS: Lower chest: Unremarkable. Hepatobiliary: No suspicious focal abnormality in the liver on this study without intravenous contrast. Gallbladder is distended with layering sludge. No intrahepatic or extrahepatic biliary dilation. Pancreas: No focal mass lesion. No dilatation of the main duct. No intraparenchymal cyst. No peripancreatic edema. Spleen: No splenomegaly. No focal mass lesion. Adrenals/Urinary Tract: No adrenal nodule or mass. Kidneys unremarkable. No evidence for hydroureter. The urinary bladder appears normal for the degree of distention. Stomach/Bowel: Stomach is unremarkable. No gastric wall thickening. No evidence of outlet obstruction. Duodenum is normally positioned as is the ligament of Treitz. Small bowel loops in the lower abdomen and central pelvis are distended and fluid-filled up to 3.6 cm diameter. Suture line in the right lower quadrant suggests prior ileocecectomy with ileocolic anastomosis. There is  substantial wall thickening circumferentially in the presumed neo terminal ileum with prominent mesenteric and perienteric edema/inflammation in the right lower quadrant. Assessment is limited by lack of intravenous contrast on today's study and a cluster of small gas bubble seen on axial images 51 and 52 of series 2 (coronal 36/5) may be extraluminal. Colon is nondilated. Vascular/Lymphatic: There is mild atherosclerotic calcification of the abdominal aorta without aneurysm. There is no gastrohepatic or hepatoduodenal ligament lymphadenopathy. No retroperitoneal or mesenteric lymphadenopathy. No pelvic sidewall lymphadenopathy. Reproductive: The prostate gland and seminal vesicles are unremarkable. Other: Small volume free fluid noted in the pelvis. Trace fluid noted adjacent to the spleen and in both pericolic gutters. Musculoskeletal: No worrisome lytic or sclerotic osseous abnormality. IMPRESSION: 1. Marked edema/inflammation in the right lower quadrant. Patient is presumably status post ileocecectomy with ileocolic anastomosis. Marked circumferential wall thickening in the presumed neo terminal ileum with prominent mesenteric and perienteric edema/inflammation in  the right lower quadrant. Assessment is limited by lack of intravenous contrast on today's study and a cluster of small gas bubbles in the region appears to be extraluminal, raising concern for but not definite for contained perforation. Overall imaging features are compatible with an infectious/inflammatory enteritis in this patient with a history of Crohn disease. No definite mesenteric abscess at this time. 2. Distended and fluid-filled small bowel loops in the lower abdomen and central pelvis compatible with ileus, likely secondary to inflammatory stricture. 3. Small volume free fluid in the abdomen and pelvis. 4. Gallbladder sludge. 5. Aortic Atherosclerosis (ICD10-I70.0). Electronically Signed   By: Misty Stanley M.D.   On: 09/20/2021 13:13    ? ?Procedures ?Procedures  ? ? ?Medications Ordered in ED ?Medications  ?ondansetron (ZOFRAN) injection 4 mg (has no administration in time range)  ? ? ?ED Course/ Medical Decision Making/ A&P ?  ?

## 2021-09-21 ENCOUNTER — Observation Stay (HOSPITAL_COMMUNITY): Payer: Medicare Other

## 2021-09-21 ENCOUNTER — Encounter (HOSPITAL_COMMUNITY): Payer: Self-pay | Admitting: Family Medicine

## 2021-09-21 DIAGNOSIS — K631 Perforation of intestine (nontraumatic): Secondary | ICD-10-CM

## 2021-09-21 DIAGNOSIS — I10 Essential (primary) hypertension: Secondary | ICD-10-CM | POA: Diagnosis not present

## 2021-09-21 DIAGNOSIS — K50019 Crohn's disease of small intestine with unspecified complications: Secondary | ICD-10-CM

## 2021-09-21 DIAGNOSIS — K6389 Other specified diseases of intestine: Secondary | ICD-10-CM | POA: Diagnosis not present

## 2021-09-21 DIAGNOSIS — D573 Sickle-cell trait: Secondary | ICD-10-CM | POA: Diagnosis present

## 2021-09-21 DIAGNOSIS — K567 Ileus, unspecified: Secondary | ICD-10-CM | POA: Diagnosis present

## 2021-09-21 DIAGNOSIS — K50812 Crohn's disease of both small and large intestine with intestinal obstruction: Secondary | ICD-10-CM | POA: Diagnosis present

## 2021-09-21 DIAGNOSIS — Z79899 Other long term (current) drug therapy: Secondary | ICD-10-CM | POA: Diagnosis not present

## 2021-09-21 DIAGNOSIS — Z91041 Radiographic dye allergy status: Secondary | ICD-10-CM | POA: Diagnosis not present

## 2021-09-21 DIAGNOSIS — Z880 Allergy status to penicillin: Secondary | ICD-10-CM | POA: Diagnosis not present

## 2021-09-21 DIAGNOSIS — R109 Unspecified abdominal pain: Secondary | ICD-10-CM | POA: Diagnosis present

## 2021-09-21 DIAGNOSIS — Z9049 Acquired absence of other specified parts of digestive tract: Secondary | ICD-10-CM | POA: Diagnosis not present

## 2021-09-21 DIAGNOSIS — K50118 Crohn's disease of large intestine with other complication: Secondary | ICD-10-CM | POA: Diagnosis present

## 2021-09-21 DIAGNOSIS — Z8249 Family history of ischemic heart disease and other diseases of the circulatory system: Secondary | ICD-10-CM | POA: Diagnosis not present

## 2021-09-21 DIAGNOSIS — F1290 Cannabis use, unspecified, uncomplicated: Secondary | ICD-10-CM | POA: Diagnosis present

## 2021-09-21 DIAGNOSIS — F17210 Nicotine dependence, cigarettes, uncomplicated: Secondary | ICD-10-CM | POA: Diagnosis present

## 2021-09-21 DIAGNOSIS — Z888 Allergy status to other drugs, medicaments and biological substances status: Secondary | ICD-10-CM | POA: Diagnosis not present

## 2021-09-21 DIAGNOSIS — R1030 Lower abdominal pain, unspecified: Secondary | ICD-10-CM | POA: Diagnosis not present

## 2021-09-21 DIAGNOSIS — I700 Atherosclerosis of aorta: Secondary | ICD-10-CM | POA: Diagnosis not present

## 2021-09-21 LAB — CBC WITH DIFFERENTIAL/PLATELET
Abs Immature Granulocytes: 0.12 10*3/uL — ABNORMAL HIGH (ref 0.00–0.07)
Basophils Absolute: 0 10*3/uL (ref 0.0–0.1)
Basophils Relative: 0 %
Eosinophils Absolute: 0 10*3/uL (ref 0.0–0.5)
Eosinophils Relative: 0 %
HCT: 33.2 % — ABNORMAL LOW (ref 39.0–52.0)
Hemoglobin: 11.1 g/dL — ABNORMAL LOW (ref 13.0–17.0)
Immature Granulocytes: 1 %
Lymphocytes Relative: 5 %
Lymphs Abs: 0.7 10*3/uL (ref 0.7–4.0)
MCH: 22.7 pg — ABNORMAL LOW (ref 26.0–34.0)
MCHC: 33.4 g/dL (ref 30.0–36.0)
MCV: 67.9 fL — ABNORMAL LOW (ref 80.0–100.0)
Monocytes Absolute: 0.2 10*3/uL (ref 0.1–1.0)
Monocytes Relative: 1 %
Neutro Abs: 15.5 10*3/uL — ABNORMAL HIGH (ref 1.7–7.7)
Neutrophils Relative %: 93 %
Platelets: 406 10*3/uL — ABNORMAL HIGH (ref 150–400)
RBC: 4.89 MIL/uL (ref 4.22–5.81)
RDW: 19.3 % — ABNORMAL HIGH (ref 11.5–15.5)
WBC: 16.5 10*3/uL — ABNORMAL HIGH (ref 4.0–10.5)
nRBC: 0 % (ref 0.0–0.2)

## 2021-09-21 LAB — COMPREHENSIVE METABOLIC PANEL
ALT: 10 U/L (ref 0–44)
AST: 11 U/L — ABNORMAL LOW (ref 15–41)
Albumin: 3.5 g/dL (ref 3.5–5.0)
Alkaline Phosphatase: 57 U/L (ref 38–126)
Anion gap: 10 (ref 5–15)
BUN: 16 mg/dL (ref 6–20)
CO2: 26 mmol/L (ref 22–32)
Calcium: 9.3 mg/dL (ref 8.9–10.3)
Chloride: 101 mmol/L (ref 98–111)
Creatinine, Ser: 0.92 mg/dL (ref 0.61–1.24)
GFR, Estimated: >60 mL/min (ref >60)
Glucose, Bld: 143 mg/dL — ABNORMAL HIGH (ref 70–99)
Potassium: 4.2 mmol/L (ref 3.5–5.1)
Sodium: 137 mmol/L (ref 135–145)
Total Bilirubin: 0.8 mg/dL (ref 0.3–1.2)
Total Protein: 7 g/dL (ref 6.5–8.1)

## 2021-09-21 LAB — MAGNESIUM: Magnesium: 2 mg/dL (ref 1.7–2.4)

## 2021-09-21 MED ORDER — IOHEXOL 300 MG/ML  SOLN
100.0000 mL | Freq: Once | INTRAMUSCULAR | Status: AC | PRN
  Administered 2021-09-21: 80 mL via INTRAVENOUS

## 2021-09-21 MED ORDER — IOHEXOL 9 MG/ML PO SOLN
ORAL | Status: AC
Start: 2021-09-21 — End: 2021-09-21
  Filled 2021-09-21: qty 1000

## 2021-09-21 MED ORDER — METHYLPREDNISOLONE SODIUM SUCC 40 MG IJ SOLR
40.0000 mg | Freq: Two times a day (BID) | INTRAMUSCULAR | Status: DC
  Administered 2021-09-21 – 2021-09-22 (×2): 40 mg via INTRAVENOUS
  Filled 2021-09-21 (×2): qty 1

## 2021-09-21 NOTE — Consult Note (Signed)
? ?Gastroenterology Consult  ? ?Referring Provider: No ref. provider found ?Primary Care Physician:  Caryl Bis, MD ?Primary Gastroenterologist:  Dr.Carver ?[facility] GI: Dr. Nyoka Cowden  ? ?Patient ID: Joshua Robertson; [id number]; 1975/08/20  ? ?Admit date: 09/20/2021 ? LOS: 0 days  ? ?Date of Consultation: 09/21/2021 ? ?Reason for Consultation:  Crohn's ileitis exacerbation with perforation ? ?History of Present Illness  ? ?Joshua Robertson is a 46 y.o. year old male  with history of Crohn's ileitis diagnosed at age 78, stricture of TI s/p ileocecectomy April 2018, followed by Dr. Eduard Roux at [facility] and also here locally by Dr. Abbey Chatters. Prior treatments for Crohn's included Humira without response, Remicade but experienced progression of disease on this, Entyvio, and currently Cimzia following ileocectomy in April 2018. Last dose of Cimzia 09/14/21, and he completed antibody testing prior to this at [place]. He has follow-up in April to discuss surgical resection vs Stelara.  ? ?Patient presented to the ED with  reports of acute onset of abdominal pain worse at RLQ, nausea, and vomiting onset Saturday evening. No diarrhea. No rectal bleeding. No fever/chills. He was doing well prior to this, with a BM about once per day. No BM since admission. Denies NSAIDs.  ? ?CT yesterday with marked edema/inflammation in right lower quadrant, marked circumferential thickening in neo terminal ileum, concern for contained perforation but assessment limited by IV contrast. Distended and fluid-filled small bowel loops compatible with ileus likely due to inflammatory stricture. Leukocytosis with WBC count 24.8., now improved to 16.5 this morning.  ? ?Surgery on board and ordered CT with oral and IV contrast. This was completed this morning with similar findings of significant inflammation of the neoterminal and distal ileum, three foci of air adjacent to the neoterminal ileum unchanged and remain concerning for contained perforation.  No progressive pneumoperitoneum or abscess. Distal small bowel ileus noted.  ? ?He notes improved abdominal pain this morning. Still with RLQ pain but improved. No further N/V. No fever/chills. Interested in advancing diet when able.  ? ? ? ?Past Medical History:  ?Diagnosis Date  ? BMI between 19-24,adult JAN 2010 149 LBS  ? Crohn's disease ([place]) AUG 2009 NUR [facility]  ? HB 9.4 FERRITIN 26  140 LBS; no serologies-pt can't afford test  ? Helicobacter pylori gastritis 2009  ? Hypertension   ? Migraine headache   ? NONE SINCE AGE 62  ? Sacroiliitis ([place]) 2009  ? Sickle cell trait ([place])   ? Spondyloarthropathy 2009 EROSIVE  ? ? ?Past Surgical History:  ?Procedure Laterality Date  ? BACK SURGERY    ? 2018  outpatient x2  ? BIOPSY  09/06/2017  ? Procedure: BIOPSY;  Surgeon: [name], MD;  [facility];  Service: Endoscopy;;  colon ?ileum  ? BIOPSY  04/18/2020  ? Procedure: BIOPSY;  Surgeon: Eloise Harman, DO;  Location: [facility];  Service: Endoscopy;;  ? COLON RESECTION    ? due to chrohn's  ? COLONOSCOPY  03/2015  ? Eduard Roux [facility]: Crohn's like stricture at ICV. Stricture was not traversable. abnormal mucosa of ICV. Bx, c/w Crohn's.   ? COLONOSCOPY N/A 07/30/2013  ? stenotic IC VALVE. UNABLE TO INTUBATE ILEUM.  ? COLONOSCOPY WITH PROPOFOL N/A 09/06/2017  ? Procedure: COLONOSCOPY WITH PROPOFOL;  Surgeon: [name], MD;  Location: [facility];  Service: Endoscopy;  Laterality: N/A;  7:30am  ? COLONOSCOPY WITH PROPOFOL N/A 04/18/2020  ? Dr. Abbey Chatters: Nonbleeding internal hemorrhoids, patent end-to-side ileocolonic  anastomosis characterized by erosion, erythema, inflammation and ulceration.  Biopsies showed nonspecific chronic inflammation at the anastomosis.  ? HEMORRHOID SURGERY N/A 08/01/2017  ? Procedure: HEMORRHOIDECTOMY;  Surgeon: Aviva Signs, MD;  Location: [facility] OR[facility];  Service: General;  Laterality: N/A;  ? LUMBAR LAMINECTOMY/DECOMPRESSION MICRODISCECTOMY Right 02/04/2017  ? Procedure:  Microdiscectomy - Lumbar four-five - right redo;  Surgeon: Kary Kos, MD;  Location: [facility];  Service: Neurosurgery;  Laterality: Right;  ? ? ?Prior to Admission medications   ?Medication Sig Start Date End Date Taking? Authorizing Provider  ?acetaminophen (TYLENOL) 500 MG tablet Take 500-1,000 mg by mouth every 6 (six) hours as needed (for pain).   Yes [provider]  ?amLODipine (NORVASC) 10 MG tablet Take 1 tablet by mouth daily. 09/12/20  Yes [provider]  ?budesonide (ENTOCORT EC) 3 MG 24 hr capsule Take 9 mg by mouth daily. 08/27/21  Yes [provider]  ?cholecalciferol (VITAMIN D) 1000 units tablet Take 1 tablet (1,000 Units total) by mouth daily. BEGIN AFTER WEEKLY VIT D DOSING IS COMPLETE ?Patient taking differently: Take 1,000 Units by mouth 3 (three) times a week. 04/11/16  Yes Fields, Marga Melnick, MD  ?CIMZIA PREFILLED 2 X 200 MG/ML KIT Inject 400 mg into the skin every 28 (twenty-eight) days. 10/01/20  Yes Mahala Menghini, PA-C  ?cloNIDine (CATAPRES) 0.1 MG tablet Take 1 tablet (0.1 mg total) by mouth 2 (two) times daily. 08/09/12  Yes Fields, Marga Melnick, MD  ?diclofenac Sodium (VOLTAREN) 1 % GEL 4g   Yes [provider]  ?docusate sodium (COLACE) 100 MG capsule Take 100 mg by mouth daily as needed for mild constipation.   Yes [provider]  ?Multiple Vitamin (MULTIVITAMIN WITH MINERALS) TABS tablet Take 1 tablet by mouth daily. One-A-Day for Men   Yes [provider]  ?ondansetron (ZOFRAN-ODT) 4 MG disintegrating tablet DISSOLVE 1 TABLET IN MOUTH EVERY 8 HOURS AS NEEDED FOR NAUSEA OR FOR VOMITING ?Patient not taking: Reported on 09/20/2021 03/09/21   Erenest Rasher, PA-C  ? ? ?Current Facility-Administered Medications  ?Medication Dose Route Frequency Provider Last Rate Last Admin  ? 0.9 %  sodium chloride infusion   Intravenous Continuous Zierle-Ghosh, Asia B, DO 75 mL/hr at 09/20/21 1732 New Bag at 09/20/21 1732  ? acetaminophen (TYLENOL) tablet 650 mg   650 mg Oral Q6H PRN Zierle-Ghosh, Asia B, DO      ? Or  ? acetaminophen (TYLENOL) suppository 650 mg  650 mg Rectal Q6H PRN Zierle-Ghosh, Asia B, DO      ? amLODipine (NORVASC) tablet 10 mg  10 mg Oral Daily Zierle-Ghosh, Asia B, DO   10 mg at 09/21/21 0950  ? ciprofloxacin (CIPRO) IVPB 400 mg  400 mg Intravenous Q12H Zierle-Ghosh, Asia B, DO 200 mL/hr at 09/21/21 0532 400 mg at 09/21/21 0532  ? cloNIDine (CATAPRES) tablet 0.1 mg  0.1 mg Oral BID Zierle-Ghosh, Asia B, DO   0.1 mg at 09/21/21 0950  ? heparin injection 5,000 Units  5,000 Units Subcutaneous Q8H Zierle-Ghosh, Asia B, DO   5,000 Units at 09/21/21 0528  ? methylPREDNISolone sodium succinate (SOLU-MEDROL) 40 mg/mL injection 40 mg  40 mg Intravenous Q12H Tat, Shanon Brow, MD      ? metroNIDAZOLE (FLAGYL) IVPB 500 mg  500 mg Intravenous Q12H Zierle-Ghosh, Asia B, DO 100 mL/hr at 09/21/21 0413 500 mg at 09/21/21 0413  ? morphine (PF) 2 MG/ML injection 2 mg  2 mg Intravenous Q2H PRN Zierle-Ghosh, Asia B, DO   2  mg at 09/21/21 0528  ? ondansetron (ZOFRAN) tablet 4 mg  4 mg Oral Q6H PRN Zierle-Ghosh, Asia B, DO      ? Or  ? ondansetron (ZOFRAN) injection 4 mg  4 mg Intravenous Q6H PRN Zierle-Ghosh, Asia B, DO   4 mg at 09/21/21 6767  ? ? ?Allergies as of 09/20/2021 - Review Complete 09/20/2021  ?Allergen Reaction Noted  ? Penicillins Swelling and Rash 08/15/2008  ? Azathioprine Nausea And Vomiting 11/25/2009  ? Iodinated contrast media Nausea And Vomiting 08/20/2013  ? Metrizamide Nausea And Vomiting 08/20/2013  ? Pentasa [mesalamine er] Rash 07/11/2013  ? ? ?Family History  ?Problem Relation Age of Onset  ? Hypertension Mother   ? Hypertension Father   ? Colon cancer Neg Hx   ? Colon polyps Neg Hx   ? Inflammatory bowel disease Neg Hx   ? ? ?Social History  ? ?Socioeconomic History  ? Marital status: Single  ?  Spouse name: Not on file  ? Number of children: Not on file  ? Years of education: Not on file  ? Highest education level: Not on file  ?Occupational History   ? Occupation: unemployed  ?Tobacco Use  ? Smoking status: Every Day  ?  Packs/day: 0.25  ?  Types: Cigarettes  ? Smokeless tobacco: Never  ? Tobacco comments:  ?  6 cig daily as of 07/29/2017  ?Vaping [place]

## 2021-09-21 NOTE — Progress Notes (Signed)
Curbside consult performed.  CT scan shows possible contained small perforation of the neoterminal ileum consistent with active Crohn's disease.  No free pneumoperitoneum noted. ?Patient does not require acute surgical intervention at this time.  He is scheduled to follow-up with surgery at [facility] later this month.  May advance diet as tolerated.  Discussed with GI.  Please call me if I can be of further assistance. ?

## 2021-09-21 NOTE — Plan of Care (Signed)
?  Problem: Education: ?Goal: Knowledge of General Education information will improve ?Description: Including pain rating scale, medication(s)/side effects and non-pharmacologic comfort measures ?Outcome: Progressing ?  ?Problem: Activity: ?Goal: Risk for activity intolerance will decrease ?Outcome: Progressing ?  ?Problem: Coping: ?Goal: Level of anxiety will decrease ?Outcome: Progressing ?  ?Problem: Safety: ?Goal: Ability to remain free from injury will improve ?Outcome: Progressing ?  ?Problem: Pain Managment: ?Goal: General experience of comfort will improve ?Outcome: Progressing ?  ?Problem: Skin Integrity: ?Goal: Risk for impaired skin integrity will decrease ?Outcome: Progressing ?  ?

## 2021-09-21 NOTE — Care Management Obs Status (Signed)
MEDICARE OBSERVATION STATUS NOTIFICATION ? ? ?Patient Details  ?Name: Joshua Robertson ?MRN: [id number] ?Date of Birth: Jul 17, 1975 ? ? ?Medicare Observation Status Notification Given:  Yes ? ? ? ?Iona Beard, LCSWA ?09/21/2021, 9:22 AM ?

## 2021-09-21 NOTE — Assessment & Plan Note (Addendum)
Patient has contained micorperforation of neoileum ?-- Continue Cipro and Flagyl for microperforation>>d/c home with 5 more days ?-- Appreciate general surgery consult ?-- Remain n.p.o. ?-- Continue IV fluids ?-- Judicious opioids ?

## 2021-09-21 NOTE — Assessment & Plan Note (Addendum)
Patient has exacerbation of underlying Crohn's disease ?GI consult>>d/c home with prednisone taper and advanced diet ?Last Cimzia dose 09/17/21 ?Switched to IV Solu-Medrol>>prednisone 40 mg daily x 7 days, then 30 mg daily x 7 days, then 20 mg daily x 7 days, then 10 mg daily x 7 days ?CT abd/pelvis as discussed above ?-- Continue Cipro and Flagyl for microperforation>>d/c home with 5 more days ?-- Appreciate general surgery consult ?-- Remain n.p.o. initially>>advanced to soft diet which pt tolerated ?-- Continued IV fluids ?4/11--GI cleared pt for d/c ?-- Judicious opioids ?

## 2021-09-21 NOTE — Progress Notes (Signed)
?  Transition of Care (TOC) Screening Note ? ? ?Patient Details  ?Name: Joshua Robertson ?Date of Birth: May 10, 1976 ? ? ?Transition of Care (TOC) CM/SW Contact:    ?Iona Beard, LCSWA ?Phone Number: ?09/21/2021, 12:03 PM ? ? ? ?Transition of Care Department [facility]) has reviewed patient and no TOC needs have been identified at this time. We will continue to monitor patient advancement through interdisciplinary progression rounds. If new patient transition needs arise, please place a TOC consult. ?  ?

## 2021-09-21 NOTE — Progress Notes (Signed)
?  ?       ?PROGRESS NOTE ? ?Joshua Robertson TWS:[id number] DOB: August 12, 1975 DOA: 09/20/2021 ?PCP: Caryl Bis, MD ? ?Brief History:  ?46 year old male with a history of Crohn's disease status post ileocecostomy April 2018 (Dr. Nyoka Cowden @ [facility]), hypertension, sickle cell trait, seronegative spondyloarthropathy, tobacco abuse, marijuana use presenting with 1 day history of nausea, vomiting, and right lower quadrant abdominal pain that started on 09/19/2021.  The patient denies any fevers, chills, headache, chest pain, shortness breath, cough, hemoptysis.  The patient has been having soft stool, normally 2-3 bowel movements on a daily basis without any hematochezia or melena.  His last bowel movement was on 09/19/2021.  The patient has been followed by GI here in [place] as well as at [facility].  The patient had previously failed Humira, Remicade, and Entyvio.  He is currently receiving Cimzia, last dose on 09/17/21.  He denies any other new medications. ?In the ED, the patient had low-grade temperature 99.1 ?F.  He was hemodynamically stable with oxygen saturation 100% room air.  BMP showed sodium 139, potassium 3.6, bicarbonate 26, serum creatinine 0.3.  AST 16, ALT 12, alk phos 360, total bilirubin 0.8.  WBC 24.8, hemoglobin 12.6, platelets 154,000.  Lactic acid 0.7>> 0.7.  The patient was started on IV Cipro and Flagyl as well as oral prednisone.  CT of the abdomen showed marked edema and inflammation in the right lower quadrant status post ileocolic anastomosis.  There is circumferential thickening of the neo terminal ileium.  Extraluminal gas bubbles were noted consistent with contained perforation.  General surgery was consulted.  GI will be consulted. ? ?Assessment and Plan: ?Bowel perforation ([place]) ?Patient has contained micorperforation of neoileum ?-- Continue Cipro and Flagyl for microperforation ?-- Appreciate general surgery consult ?-- Remain n.p.o. ?-- Continue IV fluids ?-- Judicious opioids ? ?Crohn's disease  of ileum with complication ([place]) ?Patient has exacerbation of underlying Crohn's disease ?GI consult ?Last Cimzia dose 09/17/21 ?Switch to IV Solu-Medrol ?CT abd/pelvis as discussed above ?-- Continue Cipro and Flagyl for microperforation ?-- Appreciate general surgery consult ?-- Remain n.p.o. ?-- Continue IV fluids ?-- Judicious opioids ? ?Marijuana use ?- Reports marijuana daily use ?-Cyclic vomiting syndrome a consideration ? ?Essential hypertension ?- Continue clonidine and amlodipine ? ? ? ? ? ? ? ? ?Status is: Observation ?The patient will require care spanning > 2 midnights and should be moved to inpatient because: severity of illness requiring IV steroids, IV abx ? ? ? ?Family Communication:   significant other at bedside 4/10 ? ?Consultants:  GI, general surgery ? ?Code Status:  FULL / DNR ? ?DVT Prophylaxis:  [place] Heparin / [place] Lovenox ? ? ?Procedures: ?As Listed in Progress Note Above ? ?Antibiotics: ?Cipro 4/9>> ?Flagyl 4/9>> ? ? ? ? ? ?Subjective: ?Patient states that abdominal pain is a little better than yesterday.  He has had some nausea without emesis.  No bowel movement since admission.  There is no hematochezia, melena, dysuria, hematuria.  He denies any headache, chest pain, shortness of breath, hemoptysis. ? ?Objective: ?Vitals:  ? 09/20/21 1700 09/20/21 2200 09/21/21 0100 09/21/21 0528  ?BP: (!) 146/99 120/85 120/76 (!) 141/90  ?Pulse: 83 79 67 66  ?Resp: 18 20 18 20   ?Temp: 99 ?F (37.2 ?C) 99.1 ?F (37.3 ?C) 98.4 ?F (36.9 ?C) 98.1 ?F (36.7 ?C)  ?TempSrc: Oral Oral Oral Oral  ?SpO2: 99% 98% 100% 100%  ?Weight:      ?Height:      ? ? ?  Intake/Output Summary (Last 24 hours) at 09/21/2021 0720 ?Last data filed at 09/20/2021 1800 ?Gross per 24 hour  ?Intake 1194.44 ml  ?Output --  ?Net 1194.44 ml  ? ?Weight change:  ?Exam: ? ?General:  Pt is alert, follows commands appropriately, not in acute distress ?HEENT: No icterus, No thrush, No neck mass, [place]/AT ?Cardiovascular: RRR, S1/S2, no rubs, no  gallops ?Respiratory: CTA bilaterally, no wheezing, no crackles, no rhonchi ?Abdomen: Soft/+BS, RLQ>LLQ tender, non distended, no guarding ?Extremities: No edema, No lymphangitis, No petechiae, No rashes, no synovitis ? ? ?Data Reviewed: ?I have personally reviewed following labs and imaging studies ?Basic Metabolic Panel: ?Recent Labs  ?Lab 09/20/21 ?1052  ?NA 139  ?K 3.6  ?CL 102  ?CO2 25  ?GLUCOSE 127*  ?BUN 13  ?CREATININE 0.83  ?CALCIUM 9.4  ? ?Liver Function Tests: ?Recent Labs  ?Lab 09/20/21 ?1052  ?AST 16  ?ALT 12  ?ALKPHOS 68  ?BILITOT 0.8  ?PROT 8.2*  ?ALBUMIN 4.2  ? ?Recent Labs  ?Lab 09/20/21 ?1052  ?LIPASE 28  ? ?No results for input(s): AMMONIA in the last 168 hours. ?Coagulation Profile: ?No results for input(s): INR, PROTIME in the last 168 hours. ?CBC: ?Recent Labs  ?Lab 09/20/21 ?1052  ?WBC 24.8*  ?HGB 12.6*  ?HCT 37.6*  ?MCV 68.1*  ?PLT 454*  ? ?Cardiac Enzymes: ?No results for input(s): CKTOTAL, CKMB, CKMBINDEX, TROPONINI in the last 168 hours. ?BNP: ?Invalid input(s): POCBNP ?CBG: ?No results for input(s): GLUCAP in the last 168 hours. ?HbA1C: ?No results for input(s): HGBA1C in the last 72 hours. ?Urine analysis: ?   ?Component Value Date/Time  ? COLORURINE YELLOW 09/20/2021 1229  ? APPEARANCEUR CLEAR 09/20/2021 1229  ? LABSPEC 1.012 09/20/2021 1229  ? PHURINE 7.0 09/20/2021 1229  ? GLUCOSEU NEGATIVE 09/20/2021 1229  ? HGBUR NEGATIVE 09/20/2021 1229  ? [place] NEGATIVE 09/20/2021 1229  ? [place] NEGATIVE 09/20/2021 1229  ? PROTEINUR NEGATIVE 09/20/2021 1229  ? UROBILINOGEN 0.2 11/26/2014 1430  ? NITRITE NEGATIVE 09/20/2021 1229  ? LEUKOCYTESUR NEGATIVE 09/20/2021 1229  ? ?Sepsis Labs: ?@[email or web address] (procalcitonin:4,lacticidven:4) ?)No results found for this or any previous visit (from the past 240 hour(s)).  ? ?Scheduled Meds: ? amLODipine  10 mg Oral Daily  ? budesonide  9 mg Oral Daily  ? cloNIDine  0.1 mg Oral BID  ? heparin  5,000 Units Subcutaneous Q8H  ? predniSONE  50 mg Oral Q6H   ? ?Continuous Infusions: ? sodium chloride 75 mL/hr at 09/20/21 1732  ? ciprofloxacin 400 mg (09/21/21 0532)  ? metronidazole 500 mg (09/21/21 0413)  ? ? ?Procedures/Studies: ?CT ABDOMEN PELVIS WO CONTRAST ? ?Result Date: 09/20/2021 ?CLINICAL DATA:  Nausea vomiting.  Abdominal pain. EXAM: CT ABDOMEN AND PELVIS WITHOUT CONTRAST TECHNIQUE: Multidetector CT imaging of the abdomen and pelvis was performed following the standard protocol without IV contrast. RADIATION DOSE REDUCTION: This exam was performed according to the departmental dose-optimization program which includes automated exposure control, adjustment of the mA and/or kV according to patient size and/or use of iterative reconstruction technique. COMPARISON:  07/12/2016 FINDINGS: Lower chest: Unremarkable. Hepatobiliary: No suspicious focal abnormality in the liver on this study without intravenous contrast. Gallbladder is distended with layering sludge. No intrahepatic or extrahepatic biliary dilation. Pancreas: No focal mass lesion. No dilatation of the main duct. No intraparenchymal cyst. No peripancreatic edema. Spleen: No splenomegaly. No focal mass lesion. Adrenals/Urinary Tract: No adrenal nodule or mass. Kidneys unremarkable. No evidence for hydroureter. The urinary bladder appears normal for the degree of distention. Stomach/Bowel: Stomach  is unremarkable. No gastric wall thickening. No evidence of outlet obstruction. Duodenum is normally positioned as is the ligament of Treitz. Small bowel loops in the lower abdomen and central pelvis are distended and fluid-filled up to 3.6 cm diameter. Suture line in the right lower quadrant suggests prior ileocecectomy with ileocolic anastomosis. There is substantial wall thickening circumferentially in the presumed neo terminal ileum with prominent mesenteric and perienteric edema/inflammation in the right lower quadrant. Assessment is limited by lack of intravenous contrast on today's study and a cluster of  small gas bubble seen on axial images 51 and 52 of series 2 (coronal 36/5) may be extraluminal. Colon is nondilated. Vascular/Lymphatic: There is mild atherosclerotic calcification of the abdominal aorta without aneurysm. T

## 2021-09-21 NOTE — Hospital Course (Addendum)
46 year old male with a history of Crohn's disease status post ileocecostomy April 2018 (Dr. Nyoka Cowden @ [facility]), hypertension, sickle cell trait, seronegative spondyloarthropathy, tobacco abuse, marijuana use presenting with 1 day history of nausea, vomiting, and right lower quadrant abdominal pain that started on 09/19/2021.  The patient denies any fevers, chills, headache, chest pain, shortness breath, cough, hemoptysis.  The patient has been having soft stool, normally 2-3 bowel movements on a daily basis without any hematochezia or melena.  His last bowel movement was on 09/19/2021.  The patient has been followed by GI here in [place] as well as at [facility].  The patient had previously failed Humira, Remicade, and Entyvio.  He is currently receiving Cimzia, last dose on 09/17/21.  He denies any other new medications. ?In the ED, the patient had low-grade temperature 99.1 ?F.  He was hemodynamically stable with oxygen saturation 100% room air.  BMP showed sodium 139, potassium 3.6, bicarbonate 26, serum creatinine 0.3.  AST 16, ALT 12, alk phos 360, total bilirubin 0.8.  WBC 24.8, hemoglobin 12.6, platelets 154,000.  Lactic acid 0.7>> 0.7.  The patient was started on IV Cipro and Flagyl as well as oral prednisone.  CT of the abdomen showed marked edema and inflammation in the right lower quadrant status post ileocolic anastomosis.  There is circumferential thickening of the neo terminal ileium.  Extraluminal gas bubbles were noted consistent with contained perforation.  General surgery was consulted.  GI will be consulted.  Surgery stated to manage nonoperatively.  GI agreed with IV steroids which were weaned to po prednisone and cipro and flagyl.  His diet was advanced which he tolerated ?

## 2021-09-22 DIAGNOSIS — R1030 Lower abdominal pain, unspecified: Secondary | ICD-10-CM

## 2021-09-22 LAB — BASIC METABOLIC PANEL
Anion gap: 9 (ref 5–15)
BUN: 21 mg/dL — ABNORMAL HIGH (ref 6–20)
CO2: 25 mmol/L (ref 22–32)
Calcium: 9.1 mg/dL (ref 8.9–10.3)
Chloride: 106 mmol/L (ref 98–111)
Creatinine, Ser: 0.92 mg/dL (ref 0.61–1.24)
GFR, Estimated: >60 mL/min (ref >60)
Glucose, Bld: 119 mg/dL — ABNORMAL HIGH (ref 70–99)
Potassium: 3.9 mmol/L (ref 3.5–5.1)
Sodium: 140 mmol/L (ref 135–145)

## 2021-09-22 LAB — CBC
HCT: 31.7 % — ABNORMAL LOW (ref 39.0–52.0)
Hemoglobin: 10.3 g/dL — ABNORMAL LOW (ref 13.0–17.0)
MCH: 22.2 pg — ABNORMAL LOW (ref 26.0–34.0)
MCHC: 32.5 g/dL (ref 30.0–36.0)
MCV: 68.5 fL — ABNORMAL LOW (ref 80.0–100.0)
Platelets: 398 10*3/uL (ref 150–400)
RBC: 4.63 MIL/uL (ref 4.22–5.81)
RDW: 19.1 % — ABNORMAL HIGH (ref 11.5–15.5)
WBC: 29.7 10*3/uL — ABNORMAL HIGH (ref 4.0–10.5)
nRBC: 0 % (ref 0.0–0.2)

## 2021-09-22 LAB — MAGNESIUM: Magnesium: 2.1 mg/dL (ref 1.7–2.4)

## 2021-09-22 MED ORDER — METRONIDAZOLE 500 MG PO TABS: 500.0000 mg | ORAL_TABLET | Freq: Two times a day (BID) | ORAL | Status: DC

## 2021-09-22 MED ORDER — CIPROFLOXACIN HCL 500 MG PO TABS: 500.0000 mg | ORAL_TABLET | Freq: Two times a day (BID) | ORAL | 0 refills | Status: DC

## 2021-09-22 MED ORDER — PREDNISONE 10 MG PO TABS: 40.0000 mg | ORAL_TABLET | Freq: Every day | ORAL | 0 refills | Status: DC

## 2021-09-22 MED ORDER — PREDNISONE 20 MG PO TABS: 40.0000 mg | ORAL_TABLET | Freq: Every day | ORAL | Status: DC

## 2021-09-22 MED ORDER — METRONIDAZOLE 500 MG PO TABS: 500.0000 mg | ORAL_TABLET | Freq: Two times a day (BID) | ORAL | 0 refills | Status: DC

## 2021-09-22 MED ORDER — CIPROFLOXACIN HCL 250 MG PO TABS: 500.0000 mg | ORAL_TABLET | Freq: Two times a day (BID) | ORAL | Status: DC

## 2021-09-22 NOTE — Progress Notes (Signed)
? ? ?Subjective: ?Feeling much better today. No abdominal pain, nausea, or vomiting. Hasn't had a BM, but is passing a lot of gas. Wanting to go home today.  ? ?Tells me that he has been on Cimzia every 4 weeks rather than every 2 weeks and at his follow-up with [facility], they are going to be discussing increasing vs changing his medication. He has also been on Entocort 9 mg daily for the last 2 weeks per [facility].  ? ?Objective: ?Vital signs in last 24 hours: ?Temp:  [98.1 ?F (36.7 ?C)-98.5 ?F (36.9 ?C)] 98.1 ?F (36.7 ?C) (04/11 2706) ?Pulse Rate:  [58-68] 58 (04/11 2376) ?Resp:  [16] 16 (04/10 1351) ?BP: (123-144)/(80-89) 123/80 (04/11 2831) ?SpO2:  [96 %-100 %] 100 % (04/11 5176) ?Last BM Date : 09/21/21 ?General:   Alert and oriented, pleasant ?Head:  Normocephalic and atraumatic. ?Eyes:  No icterus, sclera clear. Conjuctiva pink.  ?Abdomen:  Bowel sounds present, soft, non-tender, non-distended. No HSM or hernias noted. No rebound or guarding. No masses appreciated  ?Msk:  Symmetrical without gross deformities. Normal posture. ?Extremities:  Without edema. ?Neurologic:  Alert and  oriented x4 ?Psych:  Normal mood and affect. ? ?Intake/Output from previous day: ?04/10 0701 - 04/11 0700 ?In: 540 [P.O.:240; IV Piggyback:300] ?Out: -  ?Intake/Output this shift: ?No intake/output data recorded. ? ?Lab Results: ?Recent Labs  ?  09/20/21 ?1052 09/21/21 ?0621 09/22/21 ?1607  ?WBC 24.8* 16.5* 29.7*  ?HGB 12.6* 11.1* 10.3*  ?HCT 37.6* 33.2* 31.7*  ?PLT 454* 406* 398  ? ?BMET ?Recent Labs  ?  09/20/21 ?1052 09/21/21 ?0621 09/22/21 ?3710  ?NA 139 137 140  ?K 3.6 4.2 3.9  ?CL 102 101 106  ?CO2 25 26 25   ?GLUCOSE 127* 143* 119*  ?BUN 13 16 21*  ?CREATININE 0.83 0.92 0.92  ?CALCIUM 9.4 9.3 9.1  ? ?LFT ?Recent Labs  ?  09/20/21 ?1052 09/21/21 ?0621  ?PROT 8.2* 7.0  ?ALBUMIN 4.2 3.5  ?AST 16 11*  ?ALT 12 10  ?ALKPHOS 68 57  ?BILITOT 0.8 0.8  ? ? ?Studies/Results: ?CT ABDOMEN PELVIS WO CONTRAST ? ?Result Date: 09/20/2021 ?CLINICAL  DATA:  Nausea vomiting.  Abdominal pain. EXAM: CT ABDOMEN AND PELVIS WITHOUT CONTRAST TECHNIQUE: Multidetector CT imaging of the abdomen and pelvis was performed following the standard protocol without IV contrast. RADIATION DOSE REDUCTION: This exam was performed according to the departmental dose-optimization program which includes automated exposure control, adjustment of the mA and/or kV according to patient size and/or use of iterative reconstruction technique. COMPARISON:  07/12/2016 FINDINGS: Lower chest: Unremarkable. Hepatobiliary: No suspicious focal abnormality in the liver on this study without intravenous contrast. Gallbladder is distended with layering sludge. No intrahepatic or extrahepatic biliary dilation. Pancreas: No focal mass lesion. No dilatation of the main duct. No intraparenchymal cyst. No peripancreatic edema. Spleen: No splenomegaly. No focal mass lesion. Adrenals/Urinary Tract: No adrenal nodule or mass. Kidneys unremarkable. No evidence for hydroureter. The urinary bladder appears normal for the degree of distention. Stomach/Bowel: Stomach is unremarkable. No gastric wall thickening. No evidence of outlet obstruction. Duodenum is normally positioned as is the ligament of Treitz. Small bowel loops in the lower abdomen and central pelvis are distended and fluid-filled up to 3.6 cm diameter. Suture line in the right lower quadrant suggests prior ileocecectomy with ileocolic anastomosis. There is substantial wall thickening circumferentially in the presumed neo terminal ileum with prominent mesenteric and perienteric edema/inflammation in the right lower quadrant. Assessment is limited by lack of intravenous contrast on  today's study and a cluster of small gas bubble seen on axial images 51 and 52 of series 2 (coronal 36/5) may be extraluminal. Colon is nondilated. Vascular/Lymphatic: There is mild atherosclerotic calcification of the abdominal aorta without aneurysm. There is no  gastrohepatic or hepatoduodenal ligament lymphadenopathy. No retroperitoneal or mesenteric lymphadenopathy. No pelvic sidewall lymphadenopathy. Reproductive: The prostate gland and seminal vesicles are unremarkable. Other: Small volume free fluid noted in the pelvis. Trace fluid noted adjacent to the spleen and in both pericolic gutters. Musculoskeletal: No worrisome lytic or sclerotic osseous abnormality. IMPRESSION: 1. Marked edema/inflammation in the right lower quadrant. Patient is presumably status post ileocecectomy with ileocolic anastomosis. Marked circumferential wall thickening in the presumed neo terminal ileum with prominent mesenteric and perienteric edema/inflammation in the right lower quadrant. Assessment is limited by lack of intravenous contrast on today's study and a cluster of small gas bubbles in the region appears to be extraluminal, raising concern for but not definite for contained perforation. Overall imaging features are compatible with an infectious/inflammatory enteritis in this patient with a history of Crohn disease. No definite mesenteric abscess at this time. 2. Distended and fluid-filled small bowel loops in the lower abdomen and central pelvis compatible with ileus, likely secondary to inflammatory stricture. 3. Small volume free fluid in the abdomen and pelvis. 4. Gallbladder sludge. 5. Aortic Atherosclerosis (ICD10-I70.0). Electronically Signed   By: Misty Stanley M.D.   On: 09/20/2021 13:13  ? ?CT ABDOMEN PELVIS W CONTRAST ? ?Result Date: 09/21/2021 ?CLINICAL DATA:  Abdominal pain with nausea and vomiting. Possible contained perforation on yesterday's CT. History of Crohn's disease. EXAM: CT ABDOMEN AND PELVIS WITH CONTRAST TECHNIQUE: Multidetector CT imaging of the abdomen and pelvis was performed using the standard protocol following bolus administration of intravenous contrast. RADIATION DOSE REDUCTION: This exam was performed according to the departmental dose-optimization  program which includes automated exposure control, adjustment of the mA and/or kV according to patient size and/or use of iterative reconstruction technique. CONTRAST:  23m OMNIPAQUE IOHEXOL 300 MG/ML  SOLN COMPARISON:  CT abdomen pelvis from yesterday. FINDINGS: Lower chest: No acute abnormality. Minimal dependent subsegmental atelectasis in both posterior lower lobes. Hepatobiliary: No focal liver abnormality is seen. No gallstones, gallbladder wall thickening, or biliary dilatation. Pancreas: Unremarkable. No pancreatic ductal dilatation or surrounding inflammatory changes. Spleen: Normal in size without focal abnormality. Adrenals/Urinary Tract: Adrenal glands are unremarkable. Kidneys are normal, without renal calculi, solid lesion, or hydronephrosis. Bladder is unremarkable for the degree of distention. Stomach/Bowel: The stomach is within normal limits. Prior ileocecectomy with coloenteric anastomosis in the right lower quadrant. Continued severe wall thickening of the neoterminal ileum and moderate wall thickening of the distal ileum. Focal dilatation of the distal ileum just proximal to the neoterminal ileum is unchanged. Continued prominent surrounding inflammatory stranding and edema. Three foci of air adjacent to the neoterminal ileum are unchanged and are not clearly intraluminal (series 2, image 53). No rim enhancing fluid collection. Vascular/Lymphatic: Aortic atherosclerosis. No enlarged abdominal or pelvic lymph nodes. Unchanged reactive lymph nodes in the lower mesentery. Reproductive: Prostate is unremarkable. Other: Unchanged small volume free fluid in the pelvis and trace free fluid adjacent to the spleen. Musculoskeletal: No acute or significant osseous findings. IMPRESSION: 1. Similar significant inflammation of the neoterminal and distal ileum, most consistent with active Crohn's disease. Three foci of air adjacent to the neoterminal ileum are unchanged and remain concerning for contained  perforation. No progressive pneumoperitoneum or new abscess. 2. Unchanged distal small bowel ileus secondary to  downstream inflammation. 3. Aortic Atherosclerosis (ICD10-I70.0). Electronically Signed   By: Huntley Dec

## 2021-09-22 NOTE — Discharge Summary (Signed)
?Physician Discharge Summary ?  ?Patient: Joshua Robertson MRN: [id number] DOB: 1975/08/02  ?Admit date:     09/20/2021  ?Discharge date: 09/22/21  ?Discharge Physician: Shanon Brow Kamiah Fite  ? ?PCP: Caryl Bis, MD  ? ?Recommendations at discharge:  ? Please follow up with primary care provider within 1-2 weeks ? Please repeat BMP and CBC in one week ? Please follow up on/with [facility] GI/surgery on 10/08/21 ? ? ? ? ?Hospital Course: ?46 year old male with a history of Crohn's disease status post ileocecostomy April 2018 (Dr. Nyoka Cowden @ [facility]), hypertension, sickle cell trait, seronegative spondyloarthropathy, tobacco abuse, marijuana use presenting with 1 day history of nausea, vomiting, and right lower quadrant abdominal pain that started on 09/19/2021.  The patient denies any fevers, chills, headache, chest pain, shortness breath, cough, hemoptysis.  The patient has been having soft stool, normally 2-3 bowel movements on a daily basis without any hematochezia or melena.  His last bowel movement was on 09/19/2021.  The patient has been followed by GI here in [place] as well as at [facility].  The patient had previously failed Humira, Remicade, and Entyvio.  He is currently receiving Cimzia, last dose on 09/17/21.  He denies any other new medications. ?In the ED, the patient had low-grade temperature 99.1 ?F.  He was hemodynamically stable with oxygen saturation 100% room air.  BMP showed sodium 139, potassium 3.6, bicarbonate 26, serum creatinine 0.3.  AST 16, ALT 12, alk phos 360, total bilirubin 0.8.  WBC 24.8, hemoglobin 12.6, platelets 154,000.  Lactic acid 0.7>> 0.7.  The patient was started on IV Cipro and Flagyl as well as oral prednisone.  CT of the abdomen showed marked edema and inflammation in the right lower quadrant status post ileocolic anastomosis.  There is circumferential thickening of the neo terminal ileium.  Extraluminal gas bubbles were noted consistent with contained perforation.  General surgery was consulted.  GI  will be consulted.  Surgery stated to manage nonoperatively.  GI agreed with IV steroids which were weaned to po prednisone and cipro and flagyl.  His diet was advanced which he tolerated ? ?Assessment and Plan: ?Bowel perforation ([place]) ?Patient has contained micorperforation of neoileum ?-- Continue Cipro and Flagyl for microperforation>>d/c home with 5 more days ?-- Appreciate general surgery consult ?-- Remain n.p.o. ?-- Continue IV fluids ?-- Judicious opioids ? ?Crohn's disease of ileum with complication ([place]) ?Patient has exacerbation of underlying Crohn's disease ?GI consult>>d/c home with prednisone taper and advanced diet ?Last Cimzia dose 09/17/21 ?Switched to IV Solu-Medrol>>prednisone 40 mg daily x 7 days, then 30 mg daily x 7 days, then 20 mg daily x 7 days, then 10 mg daily x 7 days ?CT abd/pelvis as discussed above ?-- Continue Cipro and Flagyl for microperforation>>d/c home with 5 more days ?-- Appreciate general surgery consult ?-- Remain n.p.o. initially>>advanced to soft diet which pt tolerated ?-- Continued IV fluids ?4/11--GI cleared pt for d/c ?-- Judicious opioids ? ?Marijuana use ?- Reports marijuana daily use ?-Cyclic vomiting syndrome a consideration ? ?Essential hypertension ?- Continue clonidine and amlodipine ? ? ? ? ?  ?Consultants: GI, general surgery ?Procedures performed: none  ?Disposition: Home ?Diet recommendation:  ?Cardiac diet ?DISCHARGE MEDICATION: ?Allergies as of 09/22/2021   ? ?   Reactions  ? Penicillins Swelling, Rash  ? PATIENT HAS HAD A PCN REACTION WITH IMMEDIATE RASH, FACIAL/TONGUE/THROAT SWELLING, SOB, OR LIGHTHEADEDNESS WITH HYPOTENSION:  #  #  #  YES  #  #  #   ?HAS PT DEVELOPED SEVERE  RASH INVOLVING MUCUS MEMBRANES or SKIN NECROSIS: #  #  #  YES  #  #  #  ?Has patient had a PCN reaction that required hospitalization No ?Has patient had a PCN reaction occurring within the last 10 years: No ?If all of the above answers are "NO", then may proceed with Cephalosporin use.   ? Azathioprine Nausea And Vomiting  ? Iodinated Contrast Media Nausea And Vomiting  ? Metrizamide Nausea And Vomiting  ? Pentasa [mesalamine Er] Rash  ? ?  ? ?  ?Medication List  ?  ? ?STOP taking these medications   ? ?budesonide 3 MG 24 hr capsule ?Commonly known as: ENTOCORT EC ?  ?ondansetron 4 MG disintegrating tablet ?Commonly known as: ZOFRAN-ODT ?  ? ?  ? ?TAKE these medications   ? ?acetaminophen 500 MG tablet ?Commonly known as: TYLENOL ?Take 500-1,000 mg by mouth every 6 (six) hours as needed (for pain). ?  ?amLODipine 10 MG tablet ?Commonly known as: NORVASC ?Take 1 tablet by mouth daily. ?  ?cholecalciferol 1000 units tablet ?Commonly known as: VITAMIN D ?Take 1 tablet (1,000 Units total) by mouth daily. BEGIN AFTER WEEKLY VIT D DOSING IS COMPLETE ?What changed:  ?when to take this ?additional instructions ?  ?Cimzia Prefilled 2 X 200 MG/ML Kit ?Generic drug: Certolizumab Pegol ?Inject 400 mg into the skin every 28 (twenty-eight) days. ?  ?ciprofloxacin 500 MG tablet ?Commonly known as: CIPRO ?Take 1 tablet (500 mg total) by mouth 2 (two) times daily. ?  ?cloNIDine 0.1 MG tablet ?Commonly known as: Catapres ?Take 1 tablet (0.1 mg total) by mouth 2 (two) times daily. ?  ?diclofenac Sodium 1 % Gel ?Commonly known as: VOLTAREN ?4g ?  ?docusate sodium 100 MG capsule ?Commonly known as: COLACE ?Take 100 mg by mouth daily as needed for mild constipation. ?  ?metroNIDAZOLE 500 MG tablet ?Commonly known as: FLAGYL ?Take 1 tablet (500 mg total) by mouth every 12 (twelve) hours. ?  ?multivitamin with minerals Tabs tablet ?Take 1 tablet by mouth daily. One-A-Day for Men ?  ?predniSONE 10 MG tablet ?Commonly known as: DELTASONE ?Take 4 tablets (40 mg total) by mouth daily with breakfast. And decrease by 1 tablet daily every 7 days ?Start taking on: September 23, 2021 ?  ? ?  ? ? ?Discharge Exam: ?Filed Weights  ? 09/20/21 1041  ?Weight: 63.5 kg  ? ?HEENT:  [place]/AT, No thrush, no icterus ?CV:  RRR, no rub, no S3, no  S4 ?Lung:  CTA, no wheeze, no rhonchi ?Abd:  soft/+BS, NT ?Ext:  No edema, no lymphangitis, no synovitis, no rash ? ? ?Condition at discharge: stable ? ?The results of significant diagnostics from this hospitalization (including imaging, microbiology, ancillary and laboratory) are listed below for reference.  ? ?Imaging Studies: ?CT ABDOMEN PELVIS WO CONTRAST ? ?Result Date: 09/20/2021 ?CLINICAL DATA:  Nausea vomiting.  Abdominal pain. EXAM: CT ABDOMEN AND PELVIS WITHOUT CONTRAST TECHNIQUE: Multidetector CT imaging of the abdomen and pelvis was performed following the standard protocol without IV contrast. RADIATION DOSE REDUCTION: This exam was performed according to the departmental dose-optimization program which includes automated exposure control, adjustment of the mA and/or kV according to patient size and/or use of iterative reconstruction technique. COMPARISON:  07/12/2016 FINDINGS: Lower chest: Unremarkable. Hepatobiliary: No suspicious focal abnormality in the liver on this study without intravenous contrast. Gallbladder is distended with layering sludge. No intrahepatic or extrahepatic biliary dilation. Pancreas: No focal mass lesion. No dilatation of the main duct. No intraparenchymal cyst.  No peripancreatic edema. Spleen: No splenomegaly. No focal mass lesion. Adrenals/Urinary Tract: No adrenal nodule or mass. Kidneys unremarkable. No evidence for hydroureter. The urinary bladder appears normal for the degree of distention. Stomach/Bowel: Stomach is unremarkable. No gastric wall thickening. No evidence of outlet obstruction. Duodenum is normally positioned as is the ligament of [place]itz. Small bowel loops in the lower abdomen and central pelvis are distended and fluid-filled up to 3.6 cm diameter. Suture line in the right lower quadrant suggests prior ileocecectomy with ileocolic anastomosis. There is substantial wall thickening circumferentially in the presumed neo terminal ileum with prominent mesenteric  and perienteric edema/inflammation in the right lower quadrant. Assessment is limited by lack of intravenous contrast on today's study and a cluster of small gas bubble seen on axial images 51 and 52 of ser

## 2021-10-08 DIAGNOSIS — Z888 Allergy status to other drugs, medicaments and biological substances status: Secondary | ICD-10-CM | POA: Diagnosis not present

## 2021-10-08 DIAGNOSIS — K5000 Crohn's disease of small intestine without complications: Secondary | ICD-10-CM | POA: Diagnosis not present

## 2021-10-08 DIAGNOSIS — Z7952 Long term (current) use of systemic steroids: Secondary | ICD-10-CM | POA: Diagnosis not present

## 2021-10-08 DIAGNOSIS — Z79899 Other long term (current) drug therapy: Secondary | ICD-10-CM | POA: Diagnosis not present

## 2021-10-08 DIAGNOSIS — Z880 Allergy status to penicillin: Secondary | ICD-10-CM | POA: Diagnosis not present

## 2021-10-08 DIAGNOSIS — Z833 Family history of diabetes mellitus: Secondary | ICD-10-CM | POA: Diagnosis not present

## 2021-10-08 DIAGNOSIS — K5090 Crohn's disease, unspecified, without complications: Secondary | ICD-10-CM | POA: Diagnosis not present

## 2021-10-08 DIAGNOSIS — Z8249 Family history of ischemic heart disease and other diseases of the circulatory system: Secondary | ICD-10-CM | POA: Diagnosis not present

## 2021-10-08 DIAGNOSIS — Z1211 Encounter for screening for malignant neoplasm of colon: Secondary | ICD-10-CM | POA: Diagnosis not present

## 2021-10-08 DIAGNOSIS — F17210 Nicotine dependence, cigarettes, uncomplicated: Secondary | ICD-10-CM | POA: Diagnosis not present

## 2021-10-15 DIAGNOSIS — K50111 Crohn's disease of large intestine with rectal bleeding: Secondary | ICD-10-CM | POA: Diagnosis not present

## 2021-10-27 ENCOUNTER — Telehealth: Payer: Self-pay | Admitting: Internal Medicine

## 2021-10-27 DIAGNOSIS — K5000 Crohn's disease of small intestine without complications: Secondary | ICD-10-CM | POA: Diagnosis not present

## 2021-10-27 NOTE — Telephone Encounter (Signed)
Please call patient [phone] he needs dr Abbey Chatters to get the origional order for senzia that slf wrote for every 2 weeks and fax it to [facility] rheumatology ?Dr. Jomarie Longs wants him to go back to 2 instead of 4    ?

## 2021-10-28 NOTE — Telephone Encounter (Signed)
I phoned and spoke with the pt and he is advising that the surgeon from [facility] wants the pt 's medication increased back to 2 shots every 2 weeks like that Dr Oneida Alar had it prescribed . This needs to be done before any surgery can be done on the pt. Dr. Nyoka Cowden @ [facility]umat[facility] is where this order is needing to go. They have agreed to give the pt his shots. He advised me that he can't give himself these shots. ?

## 2021-10-29 ENCOUNTER — Telehealth: Payer: Self-pay | Admitting: Internal Medicine

## 2021-10-29 NOTE — Telephone Encounter (Signed)
PATIENT ON RECALL FOR BONE DENSITY

## 2021-10-29 NOTE — Telephone Encounter (Signed)
Recall sent 

## 2021-11-12 DIAGNOSIS — Z880 Allergy status to penicillin: Secondary | ICD-10-CM | POA: Diagnosis not present

## 2021-11-12 DIAGNOSIS — Z79899 Other long term (current) drug therapy: Secondary | ICD-10-CM | POA: Diagnosis not present

## 2021-11-12 DIAGNOSIS — Z91041 Radiographic dye allergy status: Secondary | ICD-10-CM | POA: Diagnosis not present

## 2021-11-12 DIAGNOSIS — K5000 Crohn's disease of small intestine without complications: Secondary | ICD-10-CM | POA: Diagnosis not present

## 2021-11-12 DIAGNOSIS — Z888 Allergy status to other drugs, medicaments and biological substances status: Secondary | ICD-10-CM | POA: Diagnosis not present

## 2021-11-13 DIAGNOSIS — R1031 Right lower quadrant pain: Secondary | ICD-10-CM | POA: Diagnosis not present

## 2021-11-13 DIAGNOSIS — K5000 Crohn's disease of small intestine without complications: Secondary | ICD-10-CM | POA: Diagnosis not present

## 2021-11-16 DIAGNOSIS — K50018 Crohn's disease of small intestine with other complication: Secondary | ICD-10-CM | POA: Diagnosis not present

## 2021-11-16 DIAGNOSIS — G8918 Other acute postprocedural pain: Secondary | ICD-10-CM | POA: Diagnosis not present

## 2021-11-16 DIAGNOSIS — K660 Peritoneal adhesions (postprocedural) (postinfection): Secondary | ICD-10-CM | POA: Diagnosis not present

## 2021-11-16 DIAGNOSIS — K55012 Diffuse acute (reversible) ischemia of small intestine: Secondary | ICD-10-CM | POA: Diagnosis not present

## 2021-11-16 DIAGNOSIS — I10 Essential (primary) hypertension: Secondary | ICD-10-CM | POA: Diagnosis not present

## 2021-11-16 DIAGNOSIS — K50013 Crohn's disease of small intestine with fistula: Secondary | ICD-10-CM | POA: Diagnosis not present

## 2021-11-16 DIAGNOSIS — Z79899 Other long term (current) drug therapy: Secondary | ICD-10-CM | POA: Diagnosis not present

## 2021-11-16 DIAGNOSIS — K5000 Crohn's disease of small intestine without complications: Secondary | ICD-10-CM | POA: Diagnosis not present

## 2021-11-16 DIAGNOSIS — K529 Noninfective gastroenteritis and colitis, unspecified: Secondary | ICD-10-CM | POA: Diagnosis not present

## 2021-11-16 DIAGNOSIS — F17210 Nicotine dependence, cigarettes, uncomplicated: Secondary | ICD-10-CM | POA: Diagnosis not present

## 2021-11-16 DIAGNOSIS — K632 Fistula of intestine: Secondary | ICD-10-CM | POA: Diagnosis not present

## 2021-11-16 DIAGNOSIS — K5140 Inflammatory polyps of colon without complications: Secondary | ICD-10-CM | POA: Diagnosis not present

## 2021-11-16 DIAGNOSIS — K50012 Crohn's disease of small intestine with intestinal obstruction: Secondary | ICD-10-CM | POA: Diagnosis not present

## 2022-02-05 ENCOUNTER — Encounter: Payer: Self-pay | Admitting: *Deleted

## 2022-02-05 ENCOUNTER — Telehealth: Payer: Self-pay | Admitting: *Deleted

## 2022-02-05 NOTE — Patient Outreach (Signed)
  Care Coordination   Initial Visit Note   02/05/2022 Name: Joshua Robertson MRN: [id number] DOB: 06-05-1976  Joshua Robertson is a 46 y.o. year old male who sees Joshua Bis, MD for primary care. I spoke with  Joshua Robertson by phone today.  What matters to the patients health and wellness today?  Schedule AWV    Goals Addressed             This Visit's Progress    Care Coordination Services (no follow-up required)       Care Coordination Interventions: Collaborated with Joshua Robertson regarding scheduling AWV. Recent visit by [facility] nurse for in-home assessment.  Provided patient and/or caregiver with verbal information about [place] (community resource)         SDOH assessments and interventions completed:  Yes  SDOH Interventions Today    Flowsheet Row Most Recent Value  SDOH Interventions   Financial Strain Interventions Intervention Not Indicated  Housing Interventions Intervention Not Indicated  Transportation Interventions Intervention Not Indicated        Care Coordination Interventions Activated:  Yes  Care Coordination Interventions:  Yes, provided   Follow up plan: No further intervention required.   Encounter Outcome:  Pt. Visit Completed   Joshua Robertson, BSN, RN-BC RN Care Coordinator Direct Dial: [phone]

## 2022-02-09 ENCOUNTER — Other Ambulatory Visit: Payer: Self-pay | Admitting: Gastroenterology

## 2022-02-11 DIAGNOSIS — Z9049 Acquired absence of other specified parts of digestive tract: Secondary | ICD-10-CM | POA: Diagnosis not present

## 2022-02-11 DIAGNOSIS — Z79899 Other long term (current) drug therapy: Secondary | ICD-10-CM | POA: Diagnosis not present

## 2022-02-11 DIAGNOSIS — F17210 Nicotine dependence, cigarettes, uncomplicated: Secondary | ICD-10-CM | POA: Diagnosis not present

## 2022-02-11 DIAGNOSIS — K5000 Crohn's disease of small intestine without complications: Secondary | ICD-10-CM | POA: Diagnosis not present

## 2022-02-11 DIAGNOSIS — Z720 Tobacco use: Secondary | ICD-10-CM | POA: Diagnosis not present

## 2022-03-01 ENCOUNTER — Encounter: Payer: Self-pay | Admitting: *Deleted

## 2022-03-30 ENCOUNTER — Telehealth: Payer: Self-pay

## 2022-03-30 NOTE — Telephone Encounter (Signed)
Dr. Abbey Chatters pt phoned stating that he only has one more dose of Cimzia. I asked him why he waited so long and his response was that [facility] normally sends it to him. He also stated that [facility] told him that they had been trying to contact us. I haven't seen anything. He needs this to be sent to [facility] asap. Thank you.

## 2022-03-31 ENCOUNTER — Other Ambulatory Visit (INDEPENDENT_AMBULATORY_CARE_PROVIDER_SITE_OTHER): Payer: Self-pay

## 2022-03-31 ENCOUNTER — Other Ambulatory Visit: Payer: Self-pay | Admitting: Internal Medicine

## 2022-03-31 MED ORDER — CIMZIA 2 X 200 MG ~~LOC~~ KIT: 400.0000 mg | PACK | SUBCUTANEOUS | 11 refills | Status: DC

## 2022-03-31 NOTE — Telephone Encounter (Signed)
Sent to [facility].  Thank you

## 2022-03-31 NOTE — Telephone Encounter (Signed)
Phoned and advised the pt of Rx being sent to [facility]. Pt expressed understanding

## 2022-05-14 ENCOUNTER — Other Ambulatory Visit: Payer: Self-pay | Admitting: Internal Medicine

## 2022-05-14 DIAGNOSIS — D649 Anemia, unspecified: Secondary | ICD-10-CM

## 2022-05-14 DIAGNOSIS — K50019 Crohn's disease of small intestine with unspecified complications: Secondary | ICD-10-CM

## 2022-05-14 NOTE — Telephone Encounter (Signed)
Ondansetron refilled for 30 days no refills. Pt's last ov was 09-10-2021

## 2022-05-24 ENCOUNTER — Telehealth: Payer: Self-pay

## 2022-05-24 NOTE — Telephone Encounter (Signed)
Faxed Documentation back to [place] regarding the Dr and location where the pt is being treated now. Pt was referred out to Dr Eduard Roux @ [facility]

## 2022-05-25 DIAGNOSIS — Z1329 Encounter for screening for other suspected endocrine disorder: Secondary | ICD-10-CM | POA: Diagnosis not present

## 2022-05-25 DIAGNOSIS — I10 Essential (primary) hypertension: Secondary | ICD-10-CM | POA: Diagnosis not present

## 2022-05-25 DIAGNOSIS — E039 Hypothyroidism, unspecified: Secondary | ICD-10-CM | POA: Diagnosis not present

## 2022-05-25 DIAGNOSIS — Z13220 Encounter for screening for lipoid disorders: Secondary | ICD-10-CM | POA: Diagnosis not present

## 2022-05-27 DIAGNOSIS — I10 Essential (primary) hypertension: Secondary | ICD-10-CM | POA: Diagnosis not present

## 2022-05-27 DIAGNOSIS — Z0001 Encounter for general adult medical examination with abnormal findings: Secondary | ICD-10-CM | POA: Diagnosis not present

## 2022-05-27 DIAGNOSIS — K5000 Crohn's disease of small intestine without complications: Secondary | ICD-10-CM | POA: Diagnosis not present

## 2022-05-27 DIAGNOSIS — Z23 Encounter for immunization: Secondary | ICD-10-CM | POA: Diagnosis not present

## 2022-05-27 DIAGNOSIS — Z681 Body mass index (BMI) 19 or less, adult: Secondary | ICD-10-CM | POA: Diagnosis not present

## 2022-05-27 DIAGNOSIS — F17210 Nicotine dependence, cigarettes, uncomplicated: Secondary | ICD-10-CM | POA: Diagnosis not present

## 2022-06-30 ENCOUNTER — Other Ambulatory Visit: Payer: Self-pay | Admitting: Internal Medicine

## 2022-06-30 MED ORDER — CIMZIA 2 X 200 MG ~~LOC~~ KIT: 400.0000 mg | PACK | SUBCUTANEOUS | 11 refills | Status: DC

## 2022-06-30 NOTE — Telephone Encounter (Signed)
Given complex nature of patient's disease, I did to tell him that I would be happy to support locally in any way I can while he follows primarily with [facility].  I just sent refills to [facility] for his Cimzia.  Thank you

## 2022-06-30 NOTE — Telephone Encounter (Signed)
Pt phoned regarding his medication. He just took the last dose today and I asked why he waited to call us. (His care was transferred to Dr Eduard Roux @ [facility]). I faxed on 05/24/2022 [facility] paper and where the pt is being treated at. He stated that you told him that all his colonoscopies and "things" would be done by [place] but his treating PA has to do that new Rx and it was the pt's responsibility to call before now. Please advise. I didn't pt's can see 2 GI dr's @ once from different entities.

## 2022-07-01 NOTE — Telephone Encounter (Signed)
Phoned and advised the pt that his medication had been sent to [facility] and he can give them a call. Pt expressed understanding

## 2022-07-10 DIAGNOSIS — Z87891 Personal history of nicotine dependence: Secondary | ICD-10-CM | POA: Diagnosis not present

## 2022-07-10 DIAGNOSIS — M7712 Lateral epicondylitis, left elbow: Secondary | ICD-10-CM | POA: Diagnosis not present

## 2022-07-10 DIAGNOSIS — I10 Essential (primary) hypertension: Secondary | ICD-10-CM | POA: Diagnosis not present

## 2022-07-10 DIAGNOSIS — M25522 Pain in left elbow: Secondary | ICD-10-CM | POA: Diagnosis not present

## 2022-07-10 DIAGNOSIS — Z880 Allergy status to penicillin: Secondary | ICD-10-CM | POA: Diagnosis not present

## 2022-07-10 DIAGNOSIS — M542 Cervicalgia: Secondary | ICD-10-CM | POA: Diagnosis not present

## 2022-07-13 ENCOUNTER — Other Ambulatory Visit: Payer: Self-pay | Admitting: Internal Medicine

## 2022-07-13 DIAGNOSIS — K50019 Crohn's disease of small intestine with unspecified complications: Secondary | ICD-10-CM

## 2022-07-13 DIAGNOSIS — D649 Anemia, unspecified: Secondary | ICD-10-CM

## 2022-07-19 DIAGNOSIS — M7710 Lateral epicondylitis, unspecified elbow: Secondary | ICD-10-CM | POA: Diagnosis not present

## 2022-07-19 DIAGNOSIS — Z6820 Body mass index (BMI) 20.0-20.9, adult: Secondary | ICD-10-CM | POA: Diagnosis not present

## 2022-08-11 DIAGNOSIS — R200 Anesthesia of skin: Secondary | ICD-10-CM | POA: Diagnosis not present

## 2022-08-11 DIAGNOSIS — M25522 Pain in left elbow: Secondary | ICD-10-CM | POA: Diagnosis not present

## 2022-08-11 DIAGNOSIS — G8929 Other chronic pain: Secondary | ICD-10-CM | POA: Diagnosis not present

## 2022-08-11 DIAGNOSIS — R202 Paresthesia of skin: Secondary | ICD-10-CM | POA: Diagnosis not present

## 2022-08-11 DIAGNOSIS — M5030 Other cervical disc degeneration, unspecified cervical region: Secondary | ICD-10-CM | POA: Diagnosis not present

## 2022-08-11 DIAGNOSIS — M791 Myalgia, unspecified site: Secondary | ICD-10-CM | POA: Diagnosis not present

## 2022-08-11 DIAGNOSIS — M792 Neuralgia and neuritis, unspecified: Secondary | ICD-10-CM | POA: Diagnosis not present

## 2022-08-12 ENCOUNTER — Encounter: Payer: Self-pay | Admitting: Neurology

## 2022-08-12 ENCOUNTER — Other Ambulatory Visit: Payer: Self-pay

## 2022-08-12 DIAGNOSIS — R202 Paresthesia of skin: Secondary | ICD-10-CM

## 2022-08-30 ENCOUNTER — Ambulatory Visit (INDEPENDENT_AMBULATORY_CARE_PROVIDER_SITE_OTHER): Payer: Medicare Other | Admitting: Neurology

## 2022-08-30 DIAGNOSIS — R202 Paresthesia of skin: Secondary | ICD-10-CM

## 2022-08-30 DIAGNOSIS — M5412 Radiculopathy, cervical region: Secondary | ICD-10-CM

## 2022-08-30 NOTE — Procedures (Signed)
[facility]  [address], [address]  [place], [place] [address] Tel: [phone] Fax: [phone] Test Date:  08/30/2022  Patient: Joshua Robertson DOB: 06-Dec-1975 Physician: Kai Levins, MD  Sex: Male Height: 5\' 9"  Ref Phys: Rennis Petty, FNP  ID#: [id number]   Technician:    History: This is a 47 year old male with numbness and tingling in the left upper limb.  NCV & EMG Findings: Extensive electrodiagnostic evaluation of the left upper limb with additional nerve conduction studies of the right upper limb was complicated by low limb temperatures despite multiple efforts to warm. Patient's upper limbs were 28-30 degrees C throughout which may slow nerve conduction responses. With that limitation noted, the study shows: Bilateral radial sensory responses show reduced amplitude (L13, R11 V). Left median and ulnar sensory responses show prolonged distal latencies (median 3.9, ulnar 3.5 ms). Left ulnar (ADM) motor response showed prolonged distal latency (3.9 ms) and decreased conduction velocity (45-48 m/s). Left median (APB) motor response is normal. Chronic motor axon loss changes without accompanying active denervation changes are seen in the left first dorsal interosseous, extensor indicis proprius, and flexor digitorum longus muscles. All other tested muscles, including cervical paraspinal muscles (at C7 level) are within normal limits with normal motor unit configuration and recruitment patterns.  Impression: This is an abnormal study. The findings are most consistent with the following: The residuals of an old intraspinal canal lesion (ie: motor radiculopathy) at the left C8 root or segment, mild in degree electrically. Prolonged distal latencies of left median and ulnar sensory responses and mildly reduced conduction velocity of the left ulnar motor response are likely due to temperature artifact as patient's limb temperature could not be maintained above 30 degrees C. A  median or ulnar or mononeuropathy cannot be fully excluded, but are felt to be less likely. Neuromuscular ultrasound could be considered if there is clinical concern for either. Low amplitude radial sensory responses are symmetric bilateral (including asymptomatic side). This favors technical difficulties and not radial mononeuropathies.    ___________________________ Kai Levins, MD    Nerve Conduction Studies Motor Nerve Results    Latency Amplitude F-Lat Segment Distance CV Comment  Site (ms) Norm (mV) Norm (ms)  (cm) (m/s) Norm   Left Median (APB) Motor  Wrist 3.4  < 3.9 8.7  > 6.0        Elbow 9.4 - 8.6 -  Elbow-Wrist 31 52  > 50   Left Ulnar (ADM) Motor  Wrist *3.9  < 3.1 10.9  > 7.0        Bel elbow 8.7 - 9.8 -  Bel elbow-Wrist 23 *48  > 50   Ab elbow 10.9 - 8.5 -  Ab elbow-Bel elbow 10 45 -    Sensory Sites    Neg Peak Lat Amplitude (O-P) Segment Distance Velocity Comment  Site (ms) Norm (V) Norm  (cm) (ms)   Left Median Sensory  Wrist-Dig II *3.9  < 3.4 24  > 20 Wrist-Dig II 13  Temp ~30 C  Left Radial Sensory  Forearm-Wrist 2.7  < 2.7 *13  > 18 Forearm-Wrist 10    Right Radial Sensory  Forearm 2.3  < 2.7 *11  > 18 Forearm-Wrist 10    Left Ulnar Sensory  Wrist-Dig V *3.5  < 3.1 14  > 12 Wrist-Dig V 11     Electromyography   Side Muscle Ins.Act Fibs Fasc Recrt Amp Dur Poly Activation Comment  Left FDI Nml Nml Nml *1- *  1+ *1+ *1+ Nml N/A  Left EIP Nml Nml Nml *2- *1+ *1+ *1+ Nml N/A  Left FPL Nml Nml Nml *1- *1+ *1+ *1+ Nml N/A  Left Pronator teres Nml Nml Nml Nml Nml Nml Nml Nml N/A  Left Biceps Nml Nml Nml Nml Nml Nml Nml Nml N/A  Left Triceps Nml Nml Nml Nml Nml Nml Nml Nml N/A  Left Deltoid Nml Nml Nml Nml Nml Nml Nml Nml N/A  Left C7 PSP Nml Nml Nml Nml Nml Nml Nml Nml N/A      Waveforms:  Motor      Sensory

## 2022-09-11 ENCOUNTER — Other Ambulatory Visit: Payer: Self-pay | Admitting: Internal Medicine

## 2022-09-11 DIAGNOSIS — D649 Anemia, unspecified: Secondary | ICD-10-CM

## 2022-09-11 DIAGNOSIS — K50019 Crohn's disease of small intestine with unspecified complications: Secondary | ICD-10-CM

## 2022-09-14 ENCOUNTER — Other Ambulatory Visit: Payer: Self-pay

## 2022-09-14 DIAGNOSIS — D649 Anemia, unspecified: Secondary | ICD-10-CM

## 2022-09-14 DIAGNOSIS — K50019 Crohn's disease of small intestine with unspecified complications: Secondary | ICD-10-CM

## 2022-09-14 MED ORDER — ONDANSETRON 4 MG PO TBDP: ORAL_TABLET | ORAL | 0 refills | Status: DC

## 2022-09-15 ENCOUNTER — Ambulatory Visit (INDEPENDENT_AMBULATORY_CARE_PROVIDER_SITE_OTHER): Payer: Medicare Other | Admitting: Orthopaedic Surgery

## 2022-09-15 ENCOUNTER — Encounter: Payer: Self-pay | Admitting: Orthopaedic Surgery

## 2022-09-15 VITALS — Ht 69.0 in | Wt 135.0 lb

## 2022-09-15 DIAGNOSIS — M542 Cervicalgia: Secondary | ICD-10-CM

## 2022-09-15 DIAGNOSIS — M5020 Other cervical disc displacement, unspecified cervical region: Secondary | ICD-10-CM | POA: Diagnosis not present

## 2022-09-16 DIAGNOSIS — M5020 Other cervical disc displacement, unspecified cervical region: Secondary | ICD-10-CM | POA: Insufficient documentation

## 2022-09-16 NOTE — Progress Notes (Addendum)
Office Visit Note   Patient: Joshua Robertson           Date of Birth: [birth year]           MRN: [id number] Visit Date: 09/15/2022              Requested by: Andrey Farmer, FNP [address] [place],  [place] [address] PCP: Richardean Chimera, MD   Assessment & Plan: Visit Diagnoses:  1. Cervicalgia   2. Protrusion of cervical intervertebral disc            With radiculopathy.   Plan: Patient needs MRI scan for cervical disc protrusion with left C7 weakness of wrist flexion triceps and finger extension with absent reflex and sensory deficit.  Patient started taking prednisone muscle relaxants anti-inflammatories without relief other than 2 days with a max dose the first 2 days of the steroid pack.  Office follow-up after scan.  Pathophysiology discussed.  Follow-Up Instructions: No follow-ups on file.   Orders:  Orders Placed This Encounter  Procedures   MR Cervical Spine w/o contrast   No orders of the defined types were placed in this encounter.     Procedures: No procedures performed   Clinical Data: No additional findings.   Subjective: Chief Complaint  Patient presents with   Neck - Pain   Left Arm - Pain    HPI 47 year old new patient visit with neck and left arm pain patient rates as a 9 out of 10.  Patient's had electrical test which shows changes consistent with left C8 nerve root.  Some suggestion of a left median and left ulnar nerve acute which could represent some carpal tunnel or cubital tunnel.  There is prolonged distal latency of median and ulnar nerve.  Chronic motor axonal loss changes with denervation left first dorsal compartment EIP FDP and paraspinal.  Patient states her pain is severe she has history of Crohn's disease.  Left arm is weak she has numbness primarily middle finger some ring and small finger.  No problems with the right arm no gait disturbance.  Review of Systems past history of Crohn's disease hypertension.  Positive THC.  History of  lumbar H&P.  All systems noncontributory HPI.   Objective: Vital Signs: Ht 5\' 9"  (1.753 m)   Wt 135 lb (61.2 kg)   BMI 19.94 kg/m   Physical Exam Constitutional:      Appearance: He is well-developed.  HENT:     Head: Normocephalic and atraumatic.     Right Ear: External ear normal.     Left Ear: External ear normal.  Eyes:     Pupils: Pupils are equal, round, and reactive to light.  Neck:     Thyroid: No thyromegaly.     Trachea: No tracheal deviation.  Cardiovascular:     Rate and Rhythm: Normal rate.  Pulmonary:     Effort: Pulmonary effort is normal.     Breath sounds: No wheezing.  Abdominal:     General: Bowel sounds are normal.     Palpations: Abdomen is soft.  Musculoskeletal:     Cervical back: Neck supple.  Skin:    General: Skin is warm and dry.     Capillary Refill: Capillary refill takes less than 2 seconds.  Neurological:     Mental Status: He is alert and oriented to person, place, and time.  Psychiatric:        Behavior: Behavior normal.  Thought Content: Thought content normal.        Judgment: Judgment normal.     Ortho Exam positive Spurling on the left she has 1 great triceps wrist flexion and finger extension weakness all C7 on the right side interosseous weakness without interosseous atrophy of the left hand.  Opposite right arm triceps biceps brachioradialis is normal.  Patient has absent triceps reflex on the involved left arm.  No lower extremity clonus ,normal gait ,no hyperreflexia.  Pain with cervical range of motion increased pain with cervical compression no relief with distraction.  Specialty Comments:  No specialty comments available.  Imaging: CLINICAL DATA: Neck pain and left arm pain.  EXAM: CERVICAL SPINE - 2-3 VIEW  COMPARISON: Soft tissue neck CT and reconstructions 07/09/2013.  FINDINGS: There is no evidence of cervical spine fracture or prevertebral soft tissue swelling. Alignment is normal. There is  increased degenerative disc disease and marginal endplate ridging [id number], 075-GRM and C6-7. Mild multilevel facet joint spurring.  No other significant bone abnormalities are identified. Lung apices are clear.  IMPRESSION: No radiographic evidence of fractures or malalignment. Obtain CT if there is concern for occult fracture.  Since 2015 study, interval increased degenerative disc disease and spondylosis C4-5, C5-6 and C6-7.  Mild facet spurring multiple levels.   Electronically Signed By: Telford Nab M.D. On: 07/10/2022 03:00   PMFS History: Patient Active Problem List   Diagnosis Date Noted   Protrusion of cervical intervertebral disc 09/16/2022   Lower abdominal pain    Crohn's disease of ileum with complication [id number]   Bowel perforation 09/21/2021   Marijuana use 09/20/2021   Constipation 08/12/2020   Asymptomatic hypertensive urgency 07/25/2019   At risk for osteopenia 03/15/2018   Internal and external thrombosed hemorrhoids    Joint pain 07/27/2017   Hemorrhoids, internal, thrombosed 07/27/2017   HNP (herniated nucleus pulposus), lumbar 02/04/2017   Marijuana abuse 06/01/2016   Anemia 05/28/2016   Pain of left great toe 04/02/2015   Essential hypertension 10/22/2010   MIGRAINE HEADACHE 02/04/2009   Dermatophytosis of body 11/22/2008   Past Medical History:  Diagnosis Date   BMI between 19-24,adult JAN 2010 149 LBS   Crohn's disease AUG 2009 NUR [place]   HB 9.4 FERRITIN 26  140 LBS; no serologies-pt can't afford test   Helicobacter pylori gastritis 2009   Hypertension    Migraine headache    NONE SINCE AGE 56   Sacroiliitis 2009   Sickle cell trait    Spondyloarthropathy 2009 EROSIVE    Family History  Problem Relation Age of Onset   Hypertension Mother    Hypertension Father    Colon cancer Neg Hx    Colon polyps Neg Hx    Inflammatory bowel disease Neg Hx     Past Surgical History:  Procedure Laterality Date   BACK SURGERY     2018   outpatient x2   BIOPSY  09/06/2017   Procedure: BIOPSY;  Surgeon: [name], MD;  Location: [facility];  Service: Endoscopy;;  colon ileum   BIOPSY  04/18/2020   Procedure: BIOPSY;  Surgeon: Eloise Harman, DO;  Location: [facility];  Service: Endoscopy;;   COLON RESECTION     due to chrohn's   COLONOSCOPY  03/2015   Eduard Roux [facility]: Crohn's like stricture at ICV. Stricture was not traversable. abnormal mucosa of ICV. Bx, c/w Crohn's.    COLONOSCOPY N/A 07/30/2013   stenotic IC VALVE. UNABLE TO INTUBATE ILEUM.   COLONOSCOPY WITH  PROPOFOL N/A 09/06/2017   Procedure: COLONOSCOPY WITH PROPOFOL;  Surgeon: [name], MD;  Location: [facility];  Service: Endoscopy;  Laterality: N/A;  7:30am   COLONOSCOPY WITH PROPOFOL N/A 04/18/2020   Dr. Abbey Chatters: Nonbleeding internal hemorrhoids, patent end-to-side ileocolonic anastomosis characterized by erosion, erythema, inflammation and ulceration.  Biopsies showed nonspecific chronic inflammation at the anastomosis.   HEMORRHOID SURGERY N/A 08/01/2017   Procedure: HEMORRHOIDECTOMY;  Surgeon: Aviva Signs, MD;  Location: [facility];  Service: General;  Laterality: N/A;   LUMBAR LAMINECTOMY/DECOMPRESSION MICRODISCECTOMY Right 02/04/2017   Procedure: Microdiscectomy - Lumbar four-five - right redo;  Surgeon: Kary Kos, MD;  Location: [place];  Service: Neurosurgery;  Laterality: Right;   Social History   Occupational History   Occupation: unemployed  Tobacco Use   Smoking status: Every Day    Packs/day: .25    Types: Cigarettes   Smokeless tobacco: Never   Tobacco comments:    6 cig daily as of 07/29/2017  Vaping Use   Vaping Use: Never used  Substance and Sexual Activity   Alcohol use: Yes    Alcohol/week: 0.0 standard drinks of alcohol    Comment: very rare   Drug use: Yes    Types: Marijuana    Comment: 2-3 times daily Helps appetite and stomach.   Sexual activity: Yes    Birth control/protection: None

## 2022-10-28 ENCOUNTER — Ambulatory Visit (INDEPENDENT_AMBULATORY_CARE_PROVIDER_SITE_OTHER): Payer: Medicare Other | Admitting: Orthopaedic Surgery

## 2022-10-28 ENCOUNTER — Encounter: Payer: Self-pay | Admitting: Orthopaedic Surgery

## 2022-10-28 VITALS — Ht 69.0 in | Wt 131.0 lb

## 2022-10-28 DIAGNOSIS — M5020 Other cervical disc displacement, unspecified cervical region: Secondary | ICD-10-CM

## 2022-10-28 DIAGNOSIS — M4802 Spinal stenosis, cervical region: Secondary | ICD-10-CM

## 2022-10-28 MED ORDER — HYDROCODONE-ACETAMINOPHEN 5-325 MG PO TABS: 1.0000 | ORAL_TABLET | Freq: Four times a day (QID) | ORAL | 0 refills | Status: DC | PRN

## 2022-10-28 NOTE — Progress Notes (Signed)
Office Visit Note   Patient: Joshua Robertson           Date of Birth: 03-Nov-1975           MRN: [id number] Visit Date: 10/28/2022              Requested by: Richardean Chimera, MD [address] [place],  [place] [address] PCP: Richardean Chimera, MD   Assessment & Plan: Visit Diagnoses:  1. Protrusion of cervical intervertebral disc   2. Foraminal stenosis of cervical region     Plan: Plan single level cervical fusion C6-7 for his central disc protrusion and severe biforaminal stenosis with left radiculopathy.  Patient's had anti-inflammatories muscle relaxants, Medrol Dosepak cortisone, therapy, IM Medrol injections.  He is taken anti-inflammatories even though it tends to flare his Crohn's.  Plan overnight observation soft collar postop.  Risk surgery discussed including low risk of pseudoarthrosis with 1 level cervical fusion.  Dysphagia dysphonia discussed.  Smoking reduction discussed minimizing postop coughing.  Questions were elicited and answered he understands wished to proceed.  Follow-Up Instructions: No follow-ups on file.   Orders:  No orders of the defined types were placed in this encounter.  Meds ordered this encounter  Medications   HYDROcodone-acetaminophen (NORCO/VICODIN) 5-325 MG tablet    Sig: Take 1 tablet by mouth every 6 (six) hours as needed for moderate pain.    Dispense:  30 tablet    Refill:  0    Pre op cervical fusion , nerve root compression      Procedures: No procedures performed   Clinical Data: No additional findings.   Subjective: Chief Complaint  Patient presents with   Neck - Pain, Follow-up    MRI review    HPI 47 year old male returns with severe neck and left arm pain and weakness.  Previous electrical test outlined on my/3/24 note showed changes consistent with left C8 nerve root problem.  Some suggestion of left median left ulnar nerve acute changes.  Chronic motor axonal loss changes with denervation first dorsal compartment and EIP, FDP  and paraspinals.  Patient does have history of Crohn's disease was surgery last year.  Weakness in his triceps.  MRI scan has been obtained and reviewed today with patient and his wife.  Patient has severe biforaminal stenosis at C6-7 with mild central stenosis C6-7.  Mild changes C5-6 with some severe right foraminal stenosis which is not symptomatic.  Patient has his arm over his head which she states is one of the few positions that gives him any relief he cannot sleep.  Is in the emergency room since last seen IM injection Depo-Medrol ibuprofen, Phenergan, Benadryl, Reglan.  Review of Systems positive Crohn's hypertension update unchanged from 09/15/2022 office visit.  Patient is a smoker.   Objective: Vital Signs: Ht 5\' 9"  (1.753 m)   Wt 131 lb (59.4 kg)   BMI 19.35 kg/m   Physical Exam Constitutional:      Appearance: He is well-developed.  HENT:     Head: Normocephalic and atraumatic.     Right Ear: External ear normal.     Left Ear: External ear normal.  Eyes:     Pupils: Pupils are equal, round, and reactive to light.  Neck:     Thyroid: No thyromegaly.     Trachea: No tracheal deviation.  Cardiovascular:     Rate and Rhythm: Normal rate.  Pulmonary:     Effort: Pulmonary effort is normal.     Breath sounds:  No wheezing.  Abdominal:     General: Bowel sounds are normal.     Palpations: Abdomen is soft.  Musculoskeletal:     Cervical back: Neck supple.  Skin:    General: Skin is warm and dry.     Capillary Refill: Capillary refill takes less than 2 seconds.  Neurological:     Mental Status: He is alert and oriented to person, place, and time.  Psychiatric:        Behavior: Behavior normal.        Thought Content: Thought content normal.        Judgment: Judgment normal.     Ortho Exam patient with 4-5 left triceps wrist flexion finger extension.  Opposite right arm triceps wrist flexion-extension finger extension flexion is normal.  Interossei is weak on the left  normal on the right.  Brachial plexus tenderness left side only.  No lower extremity hyperreflexia normal gait.  No thenar atrophy sensation radial 3 fingers of both hands is normal.  Tinel's over the left elbow mildly positive.  No subluxation of the ulnar nerve at the elbow.  Specialty Comments:  No specialty comments available.  Imaging: Narrative  CLINICAL DATA:  Cervicalgia Neck pain, Left shoulder arm pain, numbness, tingling since Jan 2024.  EXAM: MRI CERVICAL SPINE WITHOUT CONTRAST  TECHNIQUE: Multiplanar, multisequence MR imaging of the cervical spine was performed. No intravenous contrast was administered.  COMPARISON:  None Available.  FINDINGS: Alignment: No substantial sagittal subluxation.  Vertebrae: No fracture, evidence of discitis, or bone lesion.  Cord: Normal cord signal.  Posterior Fossa, vertebral arteries, paraspinal tissues: Visualized vertebral artery flow voids are maintained. No evidence of acute abnormality in the visualized posterior fossa. No paraspinal edema.  Disc levels:  C2-C3: No significant disc protrusion, foraminal stenosis, or canal stenosis.  C3-C4: Bilateral facet and uncovertebral hypertrophy with mild bilateral foraminal stenosis. No significant canal stenosis.  C4-C5: Bilateral facet and uncovertebral hypertrophy with mild bilateral foraminal stenosis. Small central disc protrusion without significant canal stenosis.  C5-C6: Right facet uncovertebral hypertrophy with severe right foraminal stenosis. Patent canal and left foramen.  C6-C7: Bilateral facet and uncovertebral hypertrophy. Bilateral severe foraminal stenosis. Mild canal stenosis.  C7-T1: No significant disc protrusion, foraminal stenosis, or canal stenosis. Procedure Note  Katherina Right, MD - 10/26/2022 Formatting of this note might be different from the original. CLINICAL DATA:  Cervicalgia Neck pain, Left shoulder arm pain, numbness, tingling  since Jan 2024.  EXAM: MRI CERVICAL SPINE WITHOUT CONTRAST  TECHNIQUE: Multiplanar, multisequence MR imaging of the cervical spine was performed. No intravenous contrast was administered.  COMPARISON:  None Available.  FINDINGS: Alignment: No substantial sagittal subluxation.  Vertebrae: No fracture, evidence of discitis, or bone lesion.  Cord: Normal cord signal.  Posterior Fossa, vertebral arteries, paraspinal tissues: Visualized vertebral artery flow voids are maintained. No evidence of acute abnormality in the visualized posterior fossa. No paraspinal edema.  Disc levels:  C2-C3: No significant disc protrusion, foraminal stenosis, or canal stenosis.  C3-C4: Bilateral facet and uncovertebral hypertrophy with mild bilateral foraminal stenosis. No significant canal stenosis.  C4-C5: Bilateral facet and uncovertebral hypertrophy with mild bilateral foraminal stenosis. Small central disc protrusion without significant canal stenosis.  C5-C6: Right facet uncovertebral hypertrophy with severe right foraminal stenosis. Patent canal and left foramen.  C6-C7: Bilateral facet and uncovertebral hypertrophy. Bilateral severe foraminal stenosis. Mild canal stenosis.  C7-T1: No significant disc protrusion, foraminal stenosis, or canal stenosis.  IMPRESSION: 1. Severe foraminal stenosis on the right  at C5-C6 and bilaterally at C6-C7. 2. Mild foraminal stenosis bilaterally at C3-C4 and C4-C5. 3. Mild canal stenosis at C6-C7.   Electronically Signed   By: Feliberto Harts M.D.   On: 10/26/2022 09:21   PMFS History: Patient Active Problem List   Diagnosis Date Noted   Foraminal stenosis of cervical region 10/28/2022   Protrusion of cervical intervertebral disc 09/16/2022   Lower abdominal pain    Crohn's disease of ileum with complication (HCC) 09/21/2021   Bowel perforation (HCC) 09/21/2021   Marijuana use 09/20/2021   Constipation 08/12/2020   Asymptomatic  hypertensive urgency 07/25/2019   At risk for osteopenia 03/15/2018   Internal and external thrombosed hemorrhoids    Joint pain 07/27/2017   Hemorrhoids, internal, thrombosed 07/27/2017   HNP (herniated nucleus pulposus), lumbar 02/04/2017   Marijuana abuse 06/01/2016   Anemia 05/28/2016   Pain of left great toe 04/02/2015   Essential hypertension 10/22/2010   MIGRAINE HEADACHE 02/04/2009   Dermatophytosis of body 11/22/2008   Past Medical History:  Diagnosis Date   BMI between 19-24,adult JAN 2010 149 LBS   Crohn's disease (HCC) AUG 2009 NUR [facility]   HB 9.4 FERRITIN 26  140 LBS; no serologies-pt can't afford test   Helicobacter pylori gastritis 2009   Hypertension    Migraine headache    NONE SINCE AGE 86   Sacroiliitis (HCC) 2009   Sickle cell trait (HCC)    Spondyloarthropathy 2009 EROSIVE    Family History  Problem Relation Age of Onset   Hypertension Mother    Hypertension Father    Colon cancer Neg Hx    Colon polyps Neg Hx    Inflammatory bowel disease Neg Hx     Past Surgical History:  Procedure Laterality Date   BACK SURGERY     2018  outpatient x2   BIOPSY  09/06/2017   Procedure: BIOPSY;  Surgeon: West Bali, MD;  Location: [facility];  Service: Endoscopy;;  colon ileum   BIOPSY  04/18/2020   Procedure: BIOPSY;  Surgeon: Lanelle Bal, DO;  Location: [facility];  Service: Endoscopy;;   COLON RESECTION     due to chrohn's   COLONOSCOPY  03/2015   [facility]: Crohn's like stricture at ICV. Stricture was not traversable. abnormal mucosa of ICV. Bx, c/w Crohn's.    COLONOSCOPY N/A 07/30/2013   stenotic IC VALVE. UNABLE TO INTUBATE ILEUM.   COLONOSCOPY WITH PROPOFOL N/A 09/06/2017   Procedure: COLONOSCOPY WITH PROPOFOL;  Surgeon: West Bali, MD;  Location: [facility];  Service: Endoscopy;  Laterality: N/A;  7:30am   COLONOSCOPY WITH PROPOFOL N/A 04/18/2020   Dr. Marletta Lor: Nonbleeding internal hemorrhoids, patent end-to-side ileocolonic  anastomosis characterized by erosion, erythema, inflammation and ulceration.  Biopsies showed nonspecific chronic inflammation at the anastomosis.   HEMORRHOID SURGERY N/A 08/01/2017   Procedure: HEMORRHOIDECTOMY;  Surgeon: Franky Macho, MD;  Location: [facility];  Service: General;  Laterality: N/A;   LUMBAR LAMINECTOMY/DECOMPRESSION MICRODISCECTOMY Right 02/04/2017   Procedure: Microdiscectomy - Lumbar four-five - right redo;  Surgeon: Donalee Citrin, MD;  Location: [facility] OR;  Service: Neurosurgery;  Laterality: Right;   Social History   Occupational History   Occupation: unemployed  Tobacco Use   Smoking status: Every Day    Packs/day: .25    Types: Cigarettes   Smokeless tobacco: Never   Tobacco comments:    6 cig daily as of 07/29/2017  Vaping Use   Vaping Use: Never used  Substance and  Sexual Activity   Alcohol use: Yes    Alcohol/week: 0.0 standard drinks of alcohol    Comment: very rare   Drug use: Yes    Types: Marijuana    Comment: 2-3 times daily Helps appetite and stomach.   Sexual activity: Yes    Birth control/protection: None

## 2022-11-22 NOTE — Progress Notes (Signed)
No surgical orders for pt upcoming surgery. Voicemail left with Cordelia Pen, Dr. Ophelia Charter OR scheduler to place orders.

## 2022-11-22 NOTE — Pre-Procedure Instructions (Signed)
Surgical Instructions    Your procedure is scheduled on December 01, 2022.  Report to [facility] Main Entrance "A" at 10:30 A.M., then check in with the Admitting office.  Call this number if you have problems the morning of surgery:  [phone]  If you have any questions prior to your surgery date call [phone]: Open Monday-Friday 8am-4pm If you experience any cold or flu symptoms such as cough, fever, chills, shortness of breath, etc. between now and your scheduled surgery, please notify us at the above number.     Remember:  Do not eat after midnight the night before your surgery  You may drink clear liquids until 9:30 AM the morning of your surgery.   Clear liquids allowed are: Water, Non-Citrus Juices (without pulp), Carbonated Beverages, Clear Tea, Black Coffee Only (NO MILK, CREAM OR POWDERED CREAMER of any kind), and Gatorade.     Take these medicines the morning of surgery with A SIP OF WATER:  amLODipine (NORVASC)   cloNIDine (CATAPRES)    May take these medicines IF NEEDED:  HYDROcodone-acetaminophen (NORCO/VICODIN)   ondansetron (ZOFRAN-ODT)     As of today, STOP taking any Aspirin (unless otherwise instructed by your surgeon) Aleve, Naproxen, Ibuprofen, Motrin, Advil, Goody's, BC's, all herbal medications, fish oil, and all vitamins.                     Do NOT Smoke (Tobacco/Vaping) for 24 hours prior to your procedure.  If you use a CPAP at night, you may bring your mask/headgear for your overnight stay.   Contacts, glasses, piercing's, hearing aid's, dentures or partials may not be worn into surgery, please bring cases for these belongings.    For patients admitted to the hospital, discharge time will be determined by your treatment team.   Patients discharged the day of surgery will not be allowed to drive home, and someone needs to stay with them for 24 hours.  SURGICAL WAITING ROOM VISITATION Patients having surgery or a procedure may have no more than 2  support people in the waiting area - these visitors may rotate.   Children under the age of 29 must have an adult with them who is not the patient. If the patient needs to stay at the hospital during part of their recovery, the visitor guidelines for inpatient rooms apply. Pre-op nurse will coordinate an appropriate time for 1 support person to accompany patient in pre-op.  This support person may not rotate.   Please refer to the [facility] website for the visitor guidelines for Inpatients (after your surgery is over and you are in a regular room).    If you received a COVID test during your pre-op visit  it is requested that you wear a mask when out in public, stay away from anyone that may not be feeling well and notify your surgeon if you develop symptoms. If you have been in contact with anyone that has tested positive in the last 10 days please notify you surgeon.     Pre-operative 5 CHG Bath Instructions   You can play a key role in reducing the risk of infection after surgery. Your skin needs to be as free of germs as possible. You can reduce the number of germs on your skin by washing with CHG (chlorhexidine gluconate) soap before surgery. CHG is an antiseptic soap that kills germs and continues to kill germs even after washing.   DO NOT use if you have an allergy to chlorhexidine/CHG  or antibacterial soaps. If your skin becomes reddened or irritated, stop using the CHG and notify one of our RNs at [phone].   Please shower with the CHG soap starting 4 days before surgery using the following schedule:     Please keep in mind the following:  DO NOT shave, including legs and underarms, starting the day of your first shower.   You may shave your face at any point before/day of surgery.  Place clean sheets on your bed the day you start using CHG soap. Use a clean washcloth (not used since being washed) for each shower. DO NOT sleep with pets once you start using the CHG.   CHG  Shower Instructions:  If you choose to wash your hair and private area, wash first with your normal shampoo/soap.  After you use shampoo/soap, rinse your hair and body thoroughly to remove shampoo/soap residue.  Turn the water OFF and apply about 3 tablespoons (45 ml) of CHG soap to a CLEAN washcloth.  Apply CHG soap ONLY FROM YOUR NECK DOWN TO YOUR TOES (washing for 3-5 minutes)  DO NOT use CHG soap on face, private areas, open wounds, or sores.  Pay special attention to the area where your surgery is being performed.  If you are having back surgery, having someone wash your back for you may be helpful. Wait 2 minutes after CHG soap is applied, then you may rinse off the CHG soap.  Pat dry with a clean towel  Put on clean clothes/pajamas   If you choose to wear lotion, please use ONLY the CHG-compatible lotions on the back of this paper.     Additional instructions for the day of surgery: DO NOT APPLY any lotions, deodorants, cologne, or perfumes.   Do not wear jewelry or makeup Do not wear nail polish, gel polish, artificial nails, or any other type of covering on natural nails (fingers and toes) Do not bring valuables to the hospital. [facility] is not responsible for any belongings or valuables. Put on clean/comfortable clothes.  Brush your teeth.  Ask your nurse before applying any prescription medications to the skin.      CHG Compatible Lotions   Aveeno Moisturizing lotion  Cetaphil Moisturizing Cream  Cetaphil Moisturizing Lotion  Clairol Herbal Essence Moisturizing Lotion, Dry Skin  Clairol Herbal Essence Moisturizing Lotion, Extra Dry Skin  Clairol Herbal Essence Moisturizing Lotion, Normal Skin  Curel Age Defying Therapeutic Moisturizing Lotion with Alpha Hydroxy  Curel Extreme Care Body Lotion  Curel Soothing Hands Moisturizing Hand Lotion  Curel Therapeutic Moisturizing Cream, Fragrance-Free  Curel Therapeutic Moisturizing Lotion, Fragrance-Free  Curel Therapeutic  Moisturizing Lotion, Original Formula  Eucerin Daily Replenishing Lotion  Eucerin Dry Skin Therapy Plus Alpha Hydroxy Crme  Eucerin Dry Skin Therapy Plus Alpha Hydroxy Lotion  Eucerin Original Crme  Eucerin Original Lotion  Eucerin Plus Crme Eucerin Plus Lotion  Eucerin TriLipid Replenishing Lotion  Keri Anti-Bacterial Hand Lotion  Keri Deep Conditioning Original Lotion Dry Skin Formula Softly Scented  Keri Deep Conditioning Original Lotion, Fragrance Free Sensitive Skin Formula  Keri Lotion Fast Absorbing Fragrance Free Sensitive Skin Formula  Keri Lotion Fast Absorbing Softly Scented Dry Skin Formula  Keri Original Lotion  Keri Skin Renewal Lotion Keri Silky Smooth Lotion  Keri Silky Smooth Sensitive Skin Lotion  Nivea Body Creamy Conditioning Oil  Nivea Body Extra Enriched Teacher, adult education Moisturizing Lotion Nivea Crme  Nivea Skin Firming Lotion  NutraDerm 30 Skin Lotion  NutraDerm Skin Lotion  NutraDerm Therapeutic Skin Cream  NutraDerm Therapeutic Skin Lotion  ProShield Protective Hand Cream  Provon moisturizing lotion   Please read over the following fact sheets that you were given.

## 2022-11-23 ENCOUNTER — Encounter (HOSPITAL_COMMUNITY)
Admission: RE | Admit: 2022-11-23 | Discharge: 2022-11-23 | Disposition: A | Discharge status: Home / Self Care | Payer: Medicare Other | Source: Ambulatory Visit | Attending: Orthopaedic Surgery | Admitting: Orthopaedic Surgery

## 2022-11-23 ENCOUNTER — Other Ambulatory Visit: Payer: Self-pay

## 2022-11-23 ENCOUNTER — Encounter (HOSPITAL_COMMUNITY): Payer: Self-pay

## 2022-11-23 VITALS — BP 139/101 | HR 79 | Temp 98.2°F | Resp 18 | Ht 69.5 in | Wt 130.6 lb

## 2022-11-23 DIAGNOSIS — Z01818 Encounter for other preprocedural examination: Secondary | ICD-10-CM

## 2022-11-23 DIAGNOSIS — Z01812 Encounter for preprocedural laboratory examination: Secondary | ICD-10-CM | POA: Insufficient documentation

## 2022-11-23 DIAGNOSIS — I2510 Atherosclerotic heart disease of native coronary artery without angina pectoris: Secondary | ICD-10-CM | POA: Diagnosis not present

## 2022-11-23 HISTORY — DX: Other complications of anesthesia, initial encounter: T88.59XA

## 2022-11-23 HISTORY — DX: Family history of other specified conditions: Z84.89

## 2022-11-23 LAB — CBC
HCT: 34.6 % — ABNORMAL LOW (ref 39.0–52.0)
Hemoglobin: 11.3 g/dL — ABNORMAL LOW (ref 13.0–17.0)
MCH: 23.4 pg — ABNORMAL LOW (ref 26.0–34.0)
MCHC: 32.7 g/dL (ref 30.0–36.0)
MCV: 71.6 fL — ABNORMAL LOW (ref 80.0–100.0)
Platelets: 363 10*3/uL (ref 150–400)
RBC: 4.83 MIL/uL (ref 4.22–5.81)
RDW: 18.6 % — ABNORMAL HIGH (ref 11.5–15.5)
WBC: 10.6 10*3/uL — ABNORMAL HIGH (ref 4.0–10.5)
nRBC: 0 % (ref 0.0–0.2)

## 2022-11-23 LAB — BASIC METABOLIC PANEL
Anion gap: 10 (ref 5–15)
BUN: 11 mg/dL (ref 6–20)
CO2: 23 mmol/L (ref 22–32)
Calcium: 8.9 mg/dL (ref 8.9–10.3)
Chloride: 104 mmol/L (ref 98–111)
Creatinine, Ser: 0.99 mg/dL (ref 0.61–1.24)
GFR, Estimated: >60 mL/min (ref >60)
Glucose, Bld: 91 mg/dL (ref 70–99)
Potassium: 3.4 mmol/L — ABNORMAL LOW (ref 3.5–5.1)
Sodium: 137 mmol/L (ref 135–145)

## 2022-11-23 LAB — SURGICAL PCR SCREEN
MRSA, PCR: NEGATIVE
Staphylococcus aureus: NEGATIVE

## 2022-11-23 NOTE — Progress Notes (Signed)
Spoke with Will in pharmacy about patient's allergy to penicillin.  Per pharmacy, patient should be able to receive Ancef.  Sherrie at Dr. Ophelia Charter' office is aware.

## 2022-11-23 NOTE — Progress Notes (Signed)
PCP - Dr. Lucita Lora. Daniel Cardiologist - Denies  PPM/ICD - Denies Device Orders - n/a Rep Notified - n/a  Chest x-ray - Denies EKG - 11/23/2022 Stress Test - Denies ECHO - Denies Cardiac Cath - Denies  Sleep Study - Denies CPAP - n/a  No DM  Last dose of GLP1 agonist- n/a GLP1 instructions: n/a  Blood Thinner Instructions: n/a Aspirin Instructions: n/a  ERAS Protcol - Clear liquids until 0930 morning of surgeyr PRE-SURGERY Ensure or G2- n/a  COVID TEST- n/a   Anesthesia review: Yes. EKG review.  Patient denies shortness of breath, fever, cough and chest pain at PAT appointment. Pt denies any respiratory illness/infection in the last two months.   All instructions explained to the patient, with a verbal understanding of the material. Patient agrees to go over the instructions while at home for a better understanding. Patient also instructed to self quarantine after being tested for COVID-19. The opportunity to ask questions was provided.

## 2022-11-29 ENCOUNTER — Ambulatory Visit: Payer: Medicare Other | Admitting: Physician Assistant

## 2022-11-30 ENCOUNTER — Ambulatory Visit (INDEPENDENT_AMBULATORY_CARE_PROVIDER_SITE_OTHER): Payer: Medicare Other | Admitting: Physician Assistant

## 2022-11-30 ENCOUNTER — Encounter: Payer: Self-pay | Admitting: Physician Assistant

## 2022-11-30 DIAGNOSIS — M4802 Spinal stenosis, cervical region: Secondary | ICD-10-CM

## 2022-11-30 DIAGNOSIS — M5020 Other cervical disc displacement, unspecified cervical region: Secondary | ICD-10-CM

## 2022-11-30 NOTE — H&P (View-Only) (Signed)
Joshua Robertson is an 47 y.o. male.   Chief Complaint: Cervical radiculopathy HPI: History of neck and left arm pain and weakness. Previous electrical test outlined on my/3/24 note showed changes consistent with left C8 nerve root problem. Some suggestion of left median left ulnar nerve acute changes. Chronic motor axonal loss changes with denervation first dorsal compartment and EIP, FDP and paraspinals. Patient does have history of Crohn's disease was surgery last year. Weakness in his triceps. MRI scan has been obtained and reviewed today with patient and his wife. Patient has severe biforaminal stenosis at C6-7 with mild central stenosis C6-7. Mild changes C5-6 with some severe right foraminal stenosis which is not symptomatic. Patient has his arm over his head which she states is one of the few positions that gives him any relief he cannot sleep. Is in the emergency room since last seen IM injection Depo-Medrol ibuprofen, Phenergan, Benadryl, Reglan.  Past Medical History:  Diagnosis Date   BMI between 19-24,adult JAN 2010 149 LBS   Complication of anesthesia    Woke up during colonoscopy   Crohn's disease (HCC) AUG 2009 NUR [facility]   HB 9.4 FERRITIN 26  140 LBS; no serologies-pt can't afford test   Family history of adverse reaction to anesthesia    Grandmother was hard to wake up   Helicobacter pylori gastritis 2009   Hypertension    Migraine headache    NONE SINCE AGE 19   Sacroiliitis (HCC) 2009   Sickle cell trait (HCC)    Spondyloarthropathy 2009 EROSIVE    Past Surgical History:  Procedure Laterality Date   BACK SURGERY     2018  outpatient x2   BIOPSY  09/06/2017   Procedure: BIOPSY;  Surgeon: Fields, Sandi L, MD;  [facility];  Service: Endoscopy;;  colon ileum   BIOPSY  04/18/2020   Procedure: BIOPSY;  Surgeon: Carver, Charles K, DO;  [facility];  Service: Endoscopy;;   COLON RESECTION     due to chrohn's   COLONOSCOPY  03/2015   [facility]:  Crohn's like stricture at ICV. Stricture was not traversable. abnormal mucosa of ICV. Bx, c/w Crohn's.    COLONOSCOPY N/A 07/30/2013   stenotic IC VALVE. UNABLE TO INTUBATE ILEUM.   COLONOSCOPY WITH PROPOFOL N/A 09/06/2017   Procedure: COLONOSCOPY WITH PROPOFOL;  Surgeon: Fields, Sandi L, MD;  [facility];  Service: Endoscopy;  Laterality: N/A;  7:30am   COLONOSCOPY WITH PROPOFOL N/A 04/18/2020   Dr. Carver: Nonbleeding internal hemorrhoids, patent end-to-side ileocolonic anastomosis characterized by erosion, erythema, inflammation and ulceration.  Biopsies showed nonspecific chronic inflammation at the anastomosis.   HEMORRHOID SURGERY N/A 08/01/2017   Procedure: HEMORRHOIDECTOMY;  Surgeon: Jenkins, Mark, MD;  Location: [facility];  Service: General;  Laterality: N/A;   LUMBAR LAMINECTOMY/DECOMPRESSION MICRODISCECTOMY Right 02/04/2017   Procedure: Microdiscectomy - Lumbar four-five - right redo;  Surgeon: Cram, Gary, MD;  Location: [facility];  Service: Neurosurgery;  Laterality: Right;    Family History  Problem Relation Age of Onset   Hypertension Mother    Hypertension Father    Colon cancer Neg Hx    Colon polyps Neg Hx    Inflammatory bowel disease Neg Hx    Social History:  reports that he has been smoking cigarettes. He has been smoking an average of .25 packs per day. He has never used smokeless tobacco. He reports current alcohol use. He reports current drug use. Drug: Marijuana.  Allergies:  Allergies  Allergen   Reactions   Penicillins Swelling and Rash    Patient states that he had face swelling as a child.   PATIENT HAS HAD A PCN REACTION WITH IMMEDIATE RASH, FACIAL/TONGUE/THROAT SWELLING, SOB, OR LIGHTHEADEDNESS WITH HYPOTENSION:  #    #  YES  #   #   HAS PT DEVELOPED SEVERE RASH INVOLVING MUCUS MEMBRANES or SKIN NECROSIS: # #  YES  # #  Has patient had a PCN reaction that required hospitalization No Has patient had a PCN reaction occurring w/in the last 10 years: No If  all of the above answers are "NO", then may proceed with Cephalosporin use.    Azathioprine Nausea And Vomiting   Iodinated Contrast Media Nausea And Vomiting   Metrizamide Nausea And Vomiting   Pentasa [Mesalamine Er] Rash    (Not in a hospital admission)   No results found for this or any previous visit (from the past 48 hour(s)). No results found.  Review of Systems  All other systems reviewed and are negative.   There were no vitals taken for this visit. Physical Exam  Objective: Vital Signs: Ht 5' 9" (1.753 m)   Wt 131 lb (59.4 kg)   BMI 19.35 kg/m    Physical Exam Constitutional:      Appearance: He is well-developed.  HENT:     Head: Normocephalic and atraumatic.     Right Ear: External ear normal.     Left Ear: External ear normal.  Eyes:     Pupils: Pupils are equal, round, and reactive to light.  Neck:     Thyroid: No thyromegaly.     Trachea: No tracheal deviation.  Cardiovascular:     Rate and Rhythm: Normal rate.  Pulmonary:     Effort: Pulmonary effort is normal.     Breath sounds: No wheezing.  Abdominal:     General: Bowel sounds are normal.     Palpations: Abdomen is soft.  Musculoskeletal:     Cervical back: Neck supple.  Skin:    General: Skin is warm and dry.     Capillary Refill: Capillary refill takes less than 2 seconds.  Neurological:     Mental Status: He is alert and oriented to person, place, and time.  Psychiatric:        Behavior: Behavior normal.        Thought Content: Thought content normal.        Judgment: Judgment normal.  Ortho Exam patient with 4-5 left triceps wrist flexion finger extension.  Opposite right arm triceps wrist flexion-extension finger extension flexion is normal.  Interossei is weak on the left normal on the right.  Brachial plexus tenderness left side only.  No lower extremity hyperreflexia normal gait.  No thenar atrophy sensation radial 3 fingers of both hands is normal.  Tinel's over the left elbow  mildly positive.  No subluxation of the ulnar nerve at the elbow.  Assessment/Plan Plan: Plan single level cervical fusion C6-7 for his central disc protrusion and severe biforaminal stenosis with left radiculopathy.  Patient's had anti-inflammatories muscle relaxants, Medrol Dosepak cortisone, therapy, IM Medrol injections.  He is taken anti-inflammatories even though it tends to flare his Crohn's.  Plan overnight observation soft collar postop.  Risk surgery discussed including low risk of pseudoarthrosis with 1 level cervical fusion.  Dysphagia dysphonia discussed.  Smoking reduction discussed minimizing postop coughing.  Questions were elicited and answered he understands wished to proceed.   Lebron Nauert Anne Jaqwan Wieber, PA 11/30/2022, 4:25 PM    

## 2022-11-30 NOTE — Progress Notes (Signed)
Office Visit Note   Patient: Joshua Robertson           Date of Birth: Feb 16, 1976           MRN: [id number] Visit Date: 11/30/2022              Requested by: Richardean Chimera, MD [address] [place],  [place] [address] PCP: Richardean Chimera, MD  Neck pain with radiculopathy    HPI: Patient is a pleasant 47 year old gentleman who presents for his preoperative history and physical for upcoming C6-7 anterior cervical discectomy with graft and fusion with plate.  He did have an extensive discussion with Dr. Ophelia Charter regarding surgical and nonsurgical options.  He is having significant radicular symptoms and wishes to go forward with surgery he has had surgery in the past without any difficulties with anesthesia or blood clots.  He is a smoker and he has been told as I did reiterate today that he needs to consider smoking cessation  Assessment & Plan: Visit Diagnoses:  1. Protrusion of cervical intervertebral disc   2. Foraminal stenosis of cervical region     Plan: I reviewed the risks of surgery with him which include but are not limited to bleeding, infection, anesthesia complications, neurovascular damage, nonunion, need for future surgery.  He wishes to go forward.  Full H&P is dictated in the hospital system  Follow-Up Instructions: No follow-ups on file.   Ortho Exam  PatOrtho Exam patient with 4-5 left triceps wrist flexion finger extension.  Opposite right arm triceps wrist flexion-extension finger extension flexion is normal.  Interossei is weak on the left normal on the right.  Brachial plexus tenderness left side only.  No lower extremity hyperreflexia normal gait.  No thenar atrophy sensation radial 3 fingers of both hands is normal.  Tinel's over the left elbow mildly positive.  No subluxation of the ulnar nerve at the elbow. ient is alert, oriented, no adenopathy, well-dressed, normal affect, normal respiratory effort.   Imaging: No results found. No images are attached to the  encounter.  Labs: Lab Results  Component Value Date   ESRSEDRATE 2 01/29/2020   ESRSEDRATE 45 (H) 09/13/2016   ESRSEDRATE 38 (H) 07/17/2016   CRP 14.3 (H) 01/29/2020   CRP 25.5 (H) 09/13/2016   CRP 12.9 (H) 07/17/2016   REPTSTATUS 05/05/2016 FINAL 05/03/2016   CULT NO GROWTH Performed at [facility]  05/03/2016     Lab Results  Component Value Date   ALBUMIN 3.5 09/21/2021   ALBUMIN 4.2 09/20/2021   ALBUMIN 4.2 09/11/2020    Lab Results  Component Value Date   MG 2.1 09/22/2021   MG 2.0 09/21/2021   MG 2.0 09/13/2016   Lab Results  Component Value Date   VD25OH 35 04/07/2017   VD25OH 27 (L) 03/22/2016    No results found for: "PREALBUMIN"    Latest Ref Rng & Units 11/23/2022    9:26 AM 09/22/2021    5:37 AM 09/21/2021    6:21 AM  CBC EXTENDED  WBC 4.0 - 10.5 K/uL 10.6  29.7  16.5   RBC 4.22 - 5.81 MIL/uL 4.83  4.63  4.89   Hemoglobin 13.0 - 17.0 g/dL 44.0  34.7  42.5   HCT 39.0 - 52.0 % 34.6  31.7  33.2   Platelets 150 - 400 K/uL 363  398  406   NEUT# 1.7 - 7.7 K/uL   15.5   Lymph# 0.7 - 4.0 K/uL   0.7  There is no height or weight on file to calculate BMI.  Orders:  No orders of the defined types were placed in this encounter.  No orders of the defined types were placed in this encounter.    Procedures: No procedures performed  Clinical Data: No additional findings.  ROS:  All other systems negative, except as noted in the HPI. Review of Systems  Objective: Vital Signs: There were no vitals taken for this visit.  Specialty Comments:  No specialty comments available.  PMFS History: Patient Active Problem List   Diagnosis Date Noted   Foraminal stenosis of cervical region 10/28/2022   Protrusion of cervical intervertebral disc 09/16/2022   Lower abdominal pain    Crohn's disease of ileum with complication (HCC) 09/21/2021   Bowel perforation (HCC) 09/21/2021   Marijuana use 09/20/2021   Constipation 08/12/2020    Asymptomatic hypertensive urgency 07/25/2019   At risk for osteopenia 03/15/2018   Internal and external thrombosed hemorrhoids    Joint pain 07/27/2017   Hemorrhoids, internal, thrombosed 07/27/2017   HNP (herniated nucleus pulposus), lumbar 02/04/2017   Marijuana abuse 06/01/2016   Anemia 05/28/2016   Pain of left great toe 04/02/2015   Essential hypertension 10/22/2010   MIGRAINE HEADACHE 02/04/2009   Dermatophytosis of body 11/22/2008   Past Medical History:  Diagnosis Date   BMI between 19-24,adult JAN 2010 149 LBS   Complication of anesthesia    Woke up during colonoscopy   Crohn's disease (HCC) AUG 2009 NUR [facility]   HB 9.4 FERRITIN 26  140 LBS; no serologies-pt can't afford test   Family history of adverse reaction to anesthesia    Grandmother was hard to wake up   Helicobacter pylori gastritis 2009   Hypertension    Migraine headache    NONE SINCE AGE 28   Sacroiliitis (HCC) 2009   Sickle cell trait (HCC)    Spondyloarthropathy 2009 EROSIVE    Family History  Problem Relation Age of Onset   Hypertension Mother    Hypertension Father    Colon cancer Neg Hx    Colon polyps Neg Hx    Inflammatory bowel disease Neg Hx     Past Surgical History:  Procedure Laterality Date   BACK SURGERY     2018  outpatient x2   BIOPSY  09/06/2017   Procedure: BIOPSY;  Surgeon: West Bali, MD;  [facility];  Service: Endoscopy;;  colon ileum   BIOPSY  04/18/2020   Procedure: BIOPSY;  Surgeon: Lanelle Bal, DO;  [facility];  Service: Endoscopy;;   COLON RESECTION     due to chrohn's   COLONOSCOPY  03/2015   [facility]: Crohn's like stricture at ICV. Stricture was not traversable. abnormal mucosa of ICV. Bx, c/w Crohn's.    COLONOSCOPY N/A 07/30/2013   stenotic IC VALVE. UNABLE TO INTUBATE ILEUM.   COLONOSCOPY WITH PROPOFOL N/A 09/06/2017   Procedure: COLONOSCOPY WITH PROPOFOL;  Surgeon: West Bali, MD;  [facility];   Service: Endoscopy;  Laterality: N/A;  7:30am   COLONOSCOPY WITH PROPOFOL N/A 04/18/2020   Dr. Marletta Lor: Nonbleeding internal hemorrhoids, patent end-to-side ileocolonic anastomosis characterized by erosion, erythema, inflammation and ulceration.  Biopsies showed nonspecific chronic inflammation at the anastomosis.   HEMORRHOID SURGERY N/A 08/01/2017   Procedure: HEMORRHOIDECTOMY;  Surgeon: Franky Macho, MD;  Location: [facility];  Service: General;  Laterality: N/A;   LUMBAR LAMINECTOMY/DECOMPRESSION MICRODISCECTOMY Right 02/04/2017   Procedure: Microdiscectomy - Lumbar four-five -  right redo;  Surgeon: Donalee Citrin, MD;  Location[facility];  Service: Neurosurgery;  Laterality: Right;   Social History   Occupational History   Occupation: unemployed  Tobacco Use   Smoking status: Every Day    Packs/day: .25    Types: Cigarettes   Smokeless tobacco: Never   Tobacco comments:    6 cig daily as of 07/29/2017  Vaping Use   Vaping Use: Never used  Substance and Sexual Activity   Alcohol use: Yes    Alcohol/week: 0.0 standard drinks of alcohol    Comment: very rare   Drug use: Yes    Types: Marijuana    Comment: 2-3 times daily Helps appetite and stomach.   Sexual activity: Yes    Birth control/protection: None

## 2022-11-30 NOTE — H&P (Signed)
Joshua Robertson is an 47 y.o. male.   Chief Complaint: Cervical radiculopathy HPI: History of neck and left arm pain and weakness. Previous electrical test outlined on my/3/24 note showed changes consistent with left C8 nerve root problem. Some suggestion of left median left ulnar nerve acute changes. Chronic motor axonal loss changes with denervation first dorsal compartment and EIP, FDP and paraspinals. Patient does have history of Crohn's disease was surgery last year. Weakness in his triceps. MRI scan has been obtained and reviewed today with patient and his wife. Patient has severe biforaminal stenosis at C6-7 with mild central stenosis C6-7. Mild changes C5-6 with some severe right foraminal stenosis which is not symptomatic. Patient has his arm over his head which she states is one of the few positions that gives him any relief he cannot sleep. Is in the emergency room since last seen IM injection Depo-Medrol ibuprofen, Phenergan, Benadryl, Reglan.  Past Medical History:  Diagnosis Date   BMI between 19-24,adult JAN 2010 149 LBS   Complication of anesthesia    Woke up during colonoscopy   Crohn's disease (HCC) AUG 2009 NUR [facility]   HB 9.4 FERRITIN 26  140 LBS; no serologies-pt can't afford test   Family history of adverse reaction to anesthesia    Grandmother was hard to wake up   Helicobacter pylori gastritis 2009   Hypertension    Migraine headache    NONE SINCE AGE 36   Sacroiliitis (HCC) 2009   Sickle cell trait (HCC)    Spondyloarthropathy 2009 EROSIVE    Past Surgical History:  Procedure Laterality Date   BACK SURGERY     2018  outpatient x2   BIOPSY  09/06/2017   Procedure: BIOPSY;  Surgeon: West Bali, MD;  [facility];  Service: Endoscopy;;  colon ileum   BIOPSY  04/18/2020   Procedure: BIOPSY;  Surgeon: Lanelle Bal, DO;  [facility];  Service: Endoscopy;;   COLON RESECTION     due to chrohn's   COLONOSCOPY  03/2015   [facility]:  Crohn's like stricture at ICV. Stricture was not traversable. abnormal mucosa of ICV. Bx, c/w Crohn's.    COLONOSCOPY N/A 07/30/2013   stenotic IC VALVE. UNABLE TO INTUBATE ILEUM.   COLONOSCOPY WITH PROPOFOL N/A 09/06/2017   Procedure: COLONOSCOPY WITH PROPOFOL;  Surgeon: West Bali, MD;  [facility];  Service: Endoscopy;  Laterality: N/A;  7:30am   COLONOSCOPY WITH PROPOFOL N/A 04/18/2020   Dr. Marletta Lor: Nonbleeding internal hemorrhoids, patent end-to-side ileocolonic anastomosis characterized by erosion, erythema, inflammation and ulceration.  Biopsies showed nonspecific chronic inflammation at the anastomosis.   HEMORRHOID SURGERY N/A 08/01/2017   Procedure: HEMORRHOIDECTOMY;  Surgeon: Franky Macho, MD;  Location: [facility];  Service: General;  Laterality: N/A;   LUMBAR LAMINECTOMY/DECOMPRESSION MICRODISCECTOMY Right 02/04/2017   Procedure: Microdiscectomy - Lumbar four-five - right redo;  Surgeon: Donalee Citrin, MD;  Location: [facility] OR;  Service: Neurosurgery;  Laterality: Right;    Family History  Problem Relation Age of Onset   Hypertension Mother    Hypertension Father    Colon cancer Neg Hx    Colon polyps Neg Hx    Inflammatory bowel disease Neg Hx    Social History:  reports that he has been smoking cigarettes. He has been smoking an average of .25 packs per day. He has never used smokeless tobacco. He reports current alcohol use. He reports current drug use. Drug: Marijuana.  Allergies:  Allergies  Allergen  Reactions   Penicillins Swelling and Rash    Patient states that he had face swelling as a child.   PATIENT HAS HAD A PCN REACTION WITH IMMEDIATE RASH, FACIAL/TONGUE/THROAT SWELLING, SOB, OR LIGHTHEADEDNESS WITH HYPOTENSION:  #    #  YES  #   #   HAS PT DEVELOPED SEVERE RASH INVOLVING MUCUS MEMBRANES or SKIN NECROSIS: # #  YES  # #  Has patient had a PCN reaction that required hospitalization No Has patient had a PCN reaction occurring w/in the last 10 years: No If  all of the above answers are "NO", then may proceed with Cephalosporin use.    Azathioprine Nausea And Vomiting   Iodinated Contrast Media Nausea And Vomiting   Metrizamide Nausea And Vomiting   Pentasa [Mesalamine Er] Rash    (Not in a hospital admission)   No results found for this or any previous visit (from the past 48 hour(s)). No results found.  Review of Systems  All other systems reviewed and are negative.   There were no vitals taken for this visit. Physical Exam  Objective: Vital Signs: Ht 5\' 9"  (1.753 m)   Wt 131 lb (59.4 kg)   BMI 19.35 kg/m    Physical Exam Constitutional:      Appearance: He is well-developed.  HENT:     Head: Normocephalic and atraumatic.     Right Ear: External ear normal.     Left Ear: External ear normal.  Eyes:     Pupils: Pupils are equal, round, and reactive to light.  Neck:     Thyroid: No thyromegaly.     Trachea: No tracheal deviation.  Cardiovascular:     Rate and Rhythm: Normal rate.  Pulmonary:     Effort: Pulmonary effort is normal.     Breath sounds: No wheezing.  Abdominal:     General: Bowel sounds are normal.     Palpations: Abdomen is soft.  Musculoskeletal:     Cervical back: Neck supple.  Skin:    General: Skin is warm and dry.     Capillary Refill: Capillary refill takes less than 2 seconds.  Neurological:     Mental Status: He is alert and oriented to person, place, and time.  Psychiatric:        Behavior: Behavior normal.        Thought Content: Thought content normal.        Judgment: Judgment normal.  Ortho Exam patient with 4-5 left triceps wrist flexion finger extension.  Opposite right arm triceps wrist flexion-extension finger extension flexion is normal.  Interossei is weak on the left normal on the right.  Brachial plexus tenderness left side only.  No lower extremity hyperreflexia normal gait.  No thenar atrophy sensation radial 3 fingers of both hands is normal.  Tinel's over the left elbow  mildly positive.  No subluxation of the ulnar nerve at the elbow.  Assessment/Plan Plan: Plan single level cervical fusion C6-7 for his central disc protrusion and severe biforaminal stenosis with left radiculopathy.  Patient's had anti-inflammatories muscle relaxants, Medrol Dosepak cortisone, therapy, IM Medrol injections.  He is taken anti-inflammatories even though it tends to flare his Crohn's.  Plan overnight observation soft collar postop.  Risk surgery discussed including low risk of pseudoarthrosis with 1 level cervical fusion.  Dysphagia dysphonia discussed.  Smoking reduction discussed minimizing postop coughing.  Questions were elicited and answered he understands wished to proceed.   West Bali Stefon Ramthun, [place] 11/30/2022, 4:25 PM

## 2022-12-01 ENCOUNTER — Ambulatory Visit (HOSPITAL_BASED_OUTPATIENT_CLINIC_OR_DEPARTMENT_OTHER): Payer: Medicare Other | Admitting: Anesthesiology

## 2022-12-01 ENCOUNTER — Observation Stay (HOSPITAL_COMMUNITY)
Admission: RE | Admit: 2022-12-01 | Discharge: 2022-12-02 | Disposition: A | Discharge status: Home / Self Care | Payer: Medicare Other | Attending: Orthopaedic Surgery | Admitting: Orthopaedic Surgery

## 2022-12-01 ENCOUNTER — Ambulatory Visit (HOSPITAL_COMMUNITY): Payer: Medicare Other

## 2022-12-01 ENCOUNTER — Encounter (HOSPITAL_COMMUNITY)
Admission: RE | Disposition: A | Discharge status: Home / Self Care | Payer: Self-pay | Source: Home / Self Care | Attending: Orthopaedic Surgery

## 2022-12-01 ENCOUNTER — Other Ambulatory Visit: Payer: Self-pay

## 2022-12-01 ENCOUNTER — Ambulatory Visit (HOSPITAL_COMMUNITY): Payer: Medicare Other | Admitting: Physician Assistant

## 2022-12-01 DIAGNOSIS — F17210 Nicotine dependence, cigarettes, uncomplicated: Secondary | ICD-10-CM

## 2022-12-01 DIAGNOSIS — D649 Anemia, unspecified: Secondary | ICD-10-CM

## 2022-12-01 DIAGNOSIS — M47812 Spondylosis without myelopathy or radiculopathy, cervical region: Secondary | ICD-10-CM | POA: Diagnosis not present

## 2022-12-01 DIAGNOSIS — I10 Essential (primary) hypertension: Secondary | ICD-10-CM

## 2022-12-01 DIAGNOSIS — Z981 Arthrodesis status: Secondary | ICD-10-CM

## 2022-12-01 DIAGNOSIS — M50123 Cervical disc disorder at C6-C7 level with radiculopathy: Principal | ICD-10-CM | POA: Insufficient documentation

## 2022-12-01 DIAGNOSIS — M5020 Other cervical disc displacement, unspecified cervical region: Secondary | ICD-10-CM | POA: Diagnosis not present

## 2022-12-01 DIAGNOSIS — M4802 Spinal stenosis, cervical region: Secondary | ICD-10-CM | POA: Diagnosis not present

## 2022-12-01 HISTORY — PX: ANTERIOR CERVICAL DECOMP/DISCECTOMY FUSION: SHX1161

## 2022-12-01 SURGERY — ANTERIOR CERVICAL DECOMPRESSION/DISCECTOMY FUSION 1 LEVEL
Anesthesia: General

## 2022-12-01 MED ORDER — EPHEDRINE 5 MG/ML INJ
INTRAVENOUS | Status: AC
  Filled 2022-12-01: qty 5

## 2022-12-01 MED ORDER — MENTHOL 3 MG MT LOZG: 1.0000 | LOZENGE | OROMUCOSAL | Status: DC | PRN

## 2022-12-01 MED ORDER — LIDOCAINE 2% (20 MG/ML) 5 ML SYRINGE
INTRAMUSCULAR | Status: AC
  Filled 2022-12-01: qty 5

## 2022-12-01 MED ORDER — ACETAMINOPHEN 160 MG/5ML PO SOLN: 325.0000 mg | ORAL | Status: DC | PRN

## 2022-12-01 MED ORDER — MIDAZOLAM HCL 2 MG/2ML IJ SOLN
INTRAMUSCULAR | Status: DC | PRN
  Administered 2022-12-01: 2 mg via INTRAVENOUS

## 2022-12-01 MED ORDER — PHENOL 1.4 % MT LIQD: 1.0000 | OROMUCOSAL | Status: DC | PRN

## 2022-12-01 MED ORDER — ACETAMINOPHEN 500 MG PO TABS
1000.0000 mg | ORAL_TABLET | Freq: Four times a day (QID) | ORAL | Status: DC
  Administered 2022-12-01 – 2022-12-02 (×3): 1000 mg via ORAL
  Filled 2022-12-01 (×3): qty 2

## 2022-12-01 MED ORDER — CEFAZOLIN SODIUM-DEXTROSE 2-4 GM/100ML-% IV SOLN
2.0000 g | INTRAVENOUS | Status: AC
  Administered 2022-12-01: 2 g via INTRAVENOUS
  Filled 2022-12-01: qty 100

## 2022-12-01 MED ORDER — ROCURONIUM BROMIDE 10 MG/ML (PF) SYRINGE
PREFILLED_SYRINGE | INTRAVENOUS | Status: DC | PRN
  Administered 2022-12-01: 30 mg via INTRAVENOUS
  Administered 2022-12-01: 60 mg via INTRAVENOUS

## 2022-12-01 MED ORDER — ORAL CARE MOUTH RINSE: 15.0000 mL | Freq: Once | OROMUCOSAL | Status: AC

## 2022-12-01 MED ORDER — ROCURONIUM BROMIDE 10 MG/ML (PF) SYRINGE
PREFILLED_SYRINGE | INTRAVENOUS | Status: AC
  Filled 2022-12-01: qty 10

## 2022-12-01 MED ORDER — ONDANSETRON HCL 4 MG/2ML IJ SOLN
INTRAMUSCULAR | Status: DC | PRN
  Administered 2022-12-01: 4 mg via INTRAVENOUS

## 2022-12-01 MED ORDER — ONDANSETRON HCL 4 MG PO TABS: 4.0000 mg | ORAL_TABLET | Freq: Four times a day (QID) | ORAL | Status: DC | PRN

## 2022-12-01 MED ORDER — FENTANYL CITRATE (PF) 250 MCG/5ML IJ SOLN
INTRAMUSCULAR | Status: AC
  Filled 2022-12-01: qty 5

## 2022-12-01 MED ORDER — MEPERIDINE HCL 25 MG/ML IJ SOLN: 6.2500 mg | INTRAMUSCULAR | Status: DC | PRN

## 2022-12-01 MED ORDER — HYDROMORPHONE HCL 1 MG/ML IJ SOLN
1.0000 mg | INTRAMUSCULAR | Status: DC | PRN
  Administered 2022-12-01: 1 mg via INTRAVENOUS
  Filled 2022-12-01: qty 1

## 2022-12-01 MED ORDER — 0.9 % SODIUM CHLORIDE (POUR BTL) OPTIME
TOPICAL | Status: DC | PRN
  Administered 2022-12-01: 1000 mL

## 2022-12-01 MED ORDER — CHLORHEXIDINE GLUCONATE 0.12 % MT SOLN
15.0000 mL | Freq: Once | OROMUCOSAL | Status: AC
  Administered 2022-12-01: 15 mL via OROMUCOSAL
  Filled 2022-12-01: qty 15

## 2022-12-01 MED ORDER — OXYCODONE HCL 5 MG PO TABS: 5.0000 mg | ORAL_TABLET | ORAL | Status: DC | PRN

## 2022-12-01 MED ORDER — METHOCARBAMOL 500 MG PO TABS
500.0000 mg | ORAL_TABLET | Freq: Four times a day (QID) | ORAL | Status: DC | PRN
  Administered 2022-12-01 – 2022-12-02 (×2): 500 mg via ORAL
  Filled 2022-12-01 (×2): qty 1

## 2022-12-01 MED ORDER — BUPIVACAINE HCL (PF) 0.5 % IJ SOLN
INTRAMUSCULAR | Status: DC | PRN
  Administered 2022-12-01: 6 mL

## 2022-12-01 MED ORDER — ONDANSETRON HCL 4 MG/2ML IJ SOLN: 4.0000 mg | Freq: Once | INTRAMUSCULAR | Status: DC | PRN

## 2022-12-01 MED ORDER — ACETAMINOPHEN 325 MG PO TABS: 325.0000 mg | ORAL_TABLET | ORAL | Status: DC | PRN

## 2022-12-01 MED ORDER — AMLODIPINE BESYLATE 10 MG PO TABS
10.0000 mg | ORAL_TABLET | Freq: Every day | ORAL | Status: DC
  Administered 2022-12-01: 10 mg via ORAL
  Filled 2022-12-01: qty 1

## 2022-12-01 MED ORDER — OXYCODONE HCL 5 MG PO TABS
10.0000 mg | ORAL_TABLET | ORAL | Status: DC | PRN
  Administered 2022-12-01 – 2022-12-02 (×4): 10 mg via ORAL
  Filled 2022-12-01 (×4): qty 2

## 2022-12-01 MED ORDER — SODIUM CHLORIDE 0.9% FLUSH: 3.0000 mL | INTRAVENOUS | Status: DC | PRN

## 2022-12-01 MED ORDER — MIDAZOLAM HCL 2 MG/2ML IJ SOLN
INTRAMUSCULAR | Status: AC
  Filled 2022-12-01: qty 2

## 2022-12-01 MED ORDER — DEXMEDETOMIDINE HCL IN NACL 80 MCG/20ML IV SOLN
INTRAVENOUS | Status: DC | PRN
  Administered 2022-12-01: 8 ug via INTRAVENOUS

## 2022-12-01 MED ORDER — PHENYLEPHRINE 80 MCG/ML (10ML) SYRINGE FOR IV PUSH (FOR BLOOD PRESSURE SUPPORT)
PREFILLED_SYRINGE | INTRAVENOUS | Status: AC
  Filled 2022-12-01: qty 10

## 2022-12-01 MED ORDER — DEXAMETHASONE SODIUM PHOSPHATE 10 MG/ML IJ SOLN
INTRAMUSCULAR | Status: AC
  Filled 2022-12-01: qty 1

## 2022-12-01 MED ORDER — ONDANSETRON HCL 4 MG/2ML IJ SOLN
INTRAMUSCULAR | Status: AC
  Filled 2022-12-01: qty 2

## 2022-12-01 MED ORDER — SODIUM CHLORIDE 0.45 % IV SOLN: INTRAVENOUS | Status: DC

## 2022-12-01 MED ORDER — LACTATED RINGERS IV SOLN: INTRAVENOUS | Status: DC

## 2022-12-01 MED ORDER — CLONIDINE HCL 0.1 MG PO TABS
0.1000 mg | ORAL_TABLET | Freq: Two times a day (BID) | ORAL | Status: DC
  Administered 2022-12-01: 0.1 mg via ORAL
  Filled 2022-12-01 (×2): qty 1

## 2022-12-01 MED ORDER — DEXAMETHASONE SODIUM PHOSPHATE 10 MG/ML IJ SOLN
INTRAMUSCULAR | Status: DC | PRN
  Administered 2022-12-01: 10 mg via INTRAVENOUS

## 2022-12-01 MED ORDER — BUPIVACAINE HCL (PF) 0.5 % IJ SOLN
INTRAMUSCULAR | Status: AC
  Filled 2022-12-01: qty 30

## 2022-12-01 MED ORDER — SODIUM CHLORIDE 0.9% FLUSH: 3.0000 mL | Freq: Two times a day (BID) | INTRAVENOUS | Status: DC

## 2022-12-01 MED ORDER — METHOCARBAMOL 1000 MG/10ML IJ SOLN: 500.0000 mg | Freq: Four times a day (QID) | INTRAVENOUS | Status: DC | PRN

## 2022-12-01 MED ORDER — SODIUM CHLORIDE 0.9 % IV SOLN: 250.0000 mL | INTRAVENOUS | Status: DC

## 2022-12-01 MED ORDER — SUGAMMADEX SODIUM 200 MG/2ML IV SOLN
INTRAVENOUS | Status: DC | PRN
  Administered 2022-12-01: 300 mg via INTRAVENOUS

## 2022-12-01 MED ORDER — OXYCODONE HCL 5 MG PO TABS: 5.0000 mg | ORAL_TABLET | Freq: Once | ORAL | Status: DC | PRN

## 2022-12-01 MED ORDER — OXYCODONE HCL 5 MG/5ML PO SOLN: 5.0000 mg | Freq: Once | ORAL | Status: DC | PRN

## 2022-12-01 MED ORDER — LIDOCAINE 2% (20 MG/ML) 5 ML SYRINGE
INTRAMUSCULAR | Status: DC | PRN
  Administered 2022-12-01: 100 mg via INTRAVENOUS

## 2022-12-01 MED ORDER — FENTANYL CITRATE (PF) 100 MCG/2ML IJ SOLN: 25.0000 ug | INTRAMUSCULAR | Status: DC | PRN

## 2022-12-01 MED ORDER — POTASSIUM CHLORIDE IN NACL 20-0.45 MEQ/L-% IV SOLN
INTRAVENOUS | Status: DC
  Filled 2022-12-01: qty 1000

## 2022-12-01 MED ORDER — FENTANYL CITRATE (PF) 250 MCG/5ML IJ SOLN
INTRAMUSCULAR | Status: DC | PRN
  Administered 2022-12-01: 50 ug via INTRAVENOUS
  Administered 2022-12-01: 100 ug via INTRAVENOUS
  Administered 2022-12-01 (×2): 50 ug via INTRAVENOUS

## 2022-12-01 MED ORDER — ONDANSETRON HCL 4 MG/2ML IJ SOLN: 4.0000 mg | Freq: Four times a day (QID) | INTRAMUSCULAR | Status: DC | PRN

## 2022-12-01 MED ORDER — PROPOFOL 10 MG/ML IV BOLUS
INTRAVENOUS | Status: DC | PRN
  Administered 2022-12-01: 180 mg via INTRAVENOUS

## 2022-12-01 SURGICAL SUPPLY — 55 items
AGENT HMST KT MTR STRL THRMB (HEMOSTASIS) ×1
APL SKNCLS STERI-STRIP NONHPOA (GAUZE/BANDAGES/DRESSINGS) ×1
BAG COUNTER SPONGE SURGICOUNT (BAG) ×2 IMPLANT
BAG SPNG CNTER NS LX DISP (BAG) ×1
BAND INSRT 18 STRL LF DISP RB (MISCELLANEOUS) ×2
BAND RUBBER #18 3X1/16 STRL (MISCELLANEOUS) IMPLANT
BENZOIN TINCTURE PRP APPL 2/3 (GAUZE/BANDAGES/DRESSINGS) ×2 IMPLANT
BIT DRILL SM SPINE QC 12 (BIT) IMPLANT
BLADE CLIPPER SURG (BLADE) IMPLANT
BONE CC-ACS 11X14 X8 6D (Bone Implant) ×1 IMPLANT
BUR ROUND FLUTED 4 SOFT TCH (BURR) ×2 IMPLANT
CHIPS BONE CANC-ACS 11X14X8 6D (Bone Implant) IMPLANT
COLLAR CERV LO CONTOUR FIRM DE (SOFTGOODS) ×2 IMPLANT
COVER MAYO STAND STRL (DRAPES) ×2 IMPLANT
COVER SURGICAL LIGHT HANDLE (MISCELLANEOUS) ×2 IMPLANT
DRAPE C-ARM 42X72 X-RAY (DRAPES) ×2 IMPLANT
DRAPE HALF SHEET 40X57 (DRAPES) ×2 IMPLANT
DRAPE MICROSCOPE SLANT 54X150 (MISCELLANEOUS) ×2 IMPLANT
DURAPREP 6ML APPLICATOR 50/CS (WOUND CARE) ×2 IMPLANT
ELECT COATED BLADE 2.86 ST (ELECTRODE) ×2 IMPLANT
ELECT REM PT RETURN 9FT ADLT (ELECTROSURGICAL) ×1
ELECTRODE REM PT RTRN 9FT ADLT (ELECTROSURGICAL) ×2 IMPLANT
EVACUATOR 1/8 PVC DRAIN (DRAIN) ×2 IMPLANT
GAUZE SPONGE 4X4 12PLY STRL (GAUZE/BANDAGES/DRESSINGS) ×2 IMPLANT
GLOVE BIOGEL PI IND STRL 8 (GLOVE) ×4 IMPLANT
GLOVE ORTHO TXT STRL SZ7.5 (GLOVE) ×4 IMPLANT
GOWN STRL REUS W/ TWL LRG LVL3 (GOWN DISPOSABLE) ×2 IMPLANT
GOWN STRL REUS W/ TWL XL LVL3 (GOWN DISPOSABLE) ×2 IMPLANT
GOWN STRL REUS W/TWL 2XL LVL3 (GOWN DISPOSABLE) ×2 IMPLANT
GOWN STRL REUS W/TWL LRG LVL3 (GOWN DISPOSABLE) ×1
GOWN STRL REUS W/TWL XL LVL3 (GOWN DISPOSABLE) ×1
HALTER HD/CHIN CERV TRACTION D (MISCELLANEOUS) ×2 IMPLANT
KIT BASIN OR (CUSTOM PROCEDURE TRAY) ×2 IMPLANT
KIT TURNOVER KIT B (KITS) ×2 IMPLANT
MANIFOLD NEPTUNE II (INSTRUMENTS) ×2 IMPLANT
NDL 25GX 5/8IN NON SAFETY (NEEDLE) ×2 IMPLANT
NEEDLE 25GX 5/8IN NON SAFETY (NEEDLE) ×1 IMPLANT
NS IRRIG 1000ML POUR BTL (IV SOLUTION) ×2 IMPLANT
PACK ORTHO CERVICAL (CUSTOM PROCEDURE TRAY) ×2 IMPLANT
PAD ARMBOARD 7.5X6 YLW CONV (MISCELLANEOUS) ×4 IMPLANT
PATTIES SURGICAL .5 X.5 (GAUZE/BANDAGES/DRESSINGS) IMPLANT
PIN TEMP FIXATION KIRSCHNER (EXFIX) IMPLANT
PLATE ANT CERV XTEND 1 LV 14 (Plate) IMPLANT
POSITIONER HEAD DONUT 9IN (MISCELLANEOUS) ×2 IMPLANT
SCREW XTD VAR 4.2 SELF TAP (Screw) IMPLANT
STRIP CLOSURE SKIN 1/2X4 (GAUZE/BANDAGES/DRESSINGS) ×2 IMPLANT
SURGIFLO W/THROMBIN 8M KIT (HEMOSTASIS) IMPLANT
SUT BONE WAX W31G (SUTURE) ×2 IMPLANT
SUT VIC AB 3-0 PS2 18 (SUTURE)
SUT VIC AB 3-0 PS2 18XBRD (SUTURE) ×2 IMPLANT
SUT VIC AB 4-0 PS2 27 (SUTURE) ×2 IMPLANT
SYR BULB EAR ULCER 3OZ GRN STR (SYRINGE) ×2 IMPLANT
TOWEL GREEN STERILE (TOWEL DISPOSABLE) ×2 IMPLANT
TOWEL GREEN STERILE FF (TOWEL DISPOSABLE) ×2 IMPLANT
WATER STERILE IRR 1000ML POUR (IV SOLUTION) ×2 IMPLANT

## 2022-12-01 NOTE — Anesthesia Preprocedure Evaluation (Addendum)
Anesthesia Evaluation  Patient identified by MRN, date of birth, ID band Patient awake    Reviewed: Allergy & Precautions, H&P , NPO status , Patient's Chart, lab work & pertinent test results, reviewed documented beta blocker date and time   History of Anesthesia Complications (+) Family history of anesthesia reaction and history of anesthetic complications  Airway Mallampati: II  TM Distance: >3 FB Neck ROM: full    Dental no notable dental hx. (+) Teeth Intact, Dental Advisory Given   Pulmonary Current Smoker and Patient abstained from smoking.   Pulmonary exam normal breath sounds clear to auscultation       Cardiovascular Exercise Tolerance: Good hypertension, Pt. on medications  Rhythm:regular Rate:Normal     Neuro/Psych  Headaches  negative psych ROS   GI/Hepatic negative GI ROS,,,(+)     substance abuse  marijuana use  Endo/Other  negative endocrine ROS    Renal/GU negative Renal ROS  negative genitourinary   Musculoskeletal  (+) Arthritis ,    Abdominal   Peds  Hematology  (+) Blood dyscrasia, anemia   Anesthesia Other Findings   Reproductive/Obstetrics negative OB ROS                             Anesthesia Physical Anesthesia Plan  ASA: 2  Anesthesia Plan: General   Post-op Pain Management: Tylenol PO (pre-op)* and Celebrex PO (pre-op)*   Induction: Intravenous  PONV Risk Score and Plan: Ondansetron, Dexamethasone and Treatment may vary due to age or medical condition  Airway Management Planned: Oral ETT and Video Laryngoscope Planned  Additional Equipment: None  Intra-op Plan:   Post-operative Plan: Extubation in OR  Informed Consent: I have reviewed the patients History and Physical, chart, labs and discussed the procedure including the risks, benefits and alternatives for the proposed anesthesia with the patient or authorized representative who has indicated  his/her understanding and acceptance.     Dental Advisory Given  Plan Discussed with: CRNA and Anesthesiologist  Anesthesia Plan Comments:         Anesthesia Quick Evaluation

## 2022-12-01 NOTE — Anesthesia Procedure Notes (Signed)
Procedure Name: Intubation Date/Time: 12/01/2022 1:09 PM  Performed by: Orlin Hilding, CRNAPre-anesthesia Checklist: Patient identified, Emergency Drugs available, Suction available, Patient being monitored and Timeout performed Patient Re-evaluated:Patient Re-evaluated prior to induction Oxygen Delivery Method: Circle system utilized Preoxygenation: Pre-oxygenation with 100% oxygen Induction Type: IV induction Ventilation: Mask ventilation without difficulty and Oral airway inserted - appropriate to patient size Laryngoscope Size: Glidescope and 4 Grade View: Grade I Tube type: Oral Tube size: 7.5 mm Number of attempts: 1 Placement Confirmation: ETT inserted through vocal cords under direct vision, positive ETCO2 and breath sounds checked- equal and bilateral Secured at: 23 cm Tube secured with: Tape Dental Injury: Teeth and Oropharynx as per pre-operative assessment

## 2022-12-01 NOTE — Progress Notes (Signed)
Orthopedic Tech Progress Note Patient Details:  Joshua Robertson [birth year] [id number]  Ortho Devices Type of Ortho Device: Soft collar Ortho Device/Splint Location: NECK Ortho Device/Splint Interventions: Ordered, Other (comment)   Post Interventions Patient Tolerated: Well Instructions Provided: Care of device  Donald Pore 12/01/2022, 4:23 PM

## 2022-12-01 NOTE — Interval H&P Note (Signed)
History and Physical Interval Note:  12/01/2022 12:46 PM  Joshua Robertson  has presented today for surgery, with the diagnosis of C6-7 spondylosis.  The various methods of treatment have been discussed with the patient and family. After consideration of risks, benefits and other options for treatment, the patient has consented to  Procedure(s) with comments: C6-7 ANTERIOR CERVICAL DISCECTOMY FUSION, ALLOGRAFT, PLATE (N/A) - Needs RNFA as a surgical intervention.  The patient's history has been reviewed, patient examined, no change in status, stable for surgery.  I have reviewed the patient's chart and labs.  Questions were answered to the patient's satisfaction.     Eldred Manges

## 2022-12-01 NOTE — Discharge Instructions (Signed)
Keep collar on at all times.  Use your EXTR, wrapped in Saran wrap and change from the dry collar.  The shower collar.  After you shower and dry off reapply the dry collar without moving her neck.  See Dr. Ophelia Charter in 1 week.  He will have less discomfort if he sleeps in the chair or recliner type position for a while.  Stay with softer foods and liquids initially since large chunks of food may hang up trying to swallow them.  A few weeks after surgery you can resume eating anything you like.

## 2022-12-01 NOTE — Op Note (Signed)
Pre and postop diagnosis: C6-7 cervical disc protrusion with spinal stenosis and right foraminal stenosis.  Procedure: C6-7 anterior cervical discectomy and fusion, allograft and plate.  Surgeon: Annell Greening, MD  Assistant: Youlanda Roys, RNFA  Anesthesia: General orotracheal +6 cc Marcaine local.  PATIENT IS NOT ALLERGIC TO ANCEF  EBL: Less than 100 cc  Implants:Implants  Implants  BONE CC-ACS 11X14 X8 6D - [id number]  Inventory Item: BONE CC-ACS 11X14 X8 6D Serial no.: [id number] Model/Cat no.: [id number]  Implant name: BONE CC-ACS 11X14 X8 6D - [id number] Laterality: N/A Area: Cervical Level 6-7  Manufacturer: [employer] CERVICAL BONE SPACERS Date of Manufacture:   Action: Implanted Number Used: 1   Device Identifier: Device Identifier Type:   PLATE ANT CERV XTEND 1 LV 14 - [id number]  Inventory Item: PLATE ANT CERV XTEND 1 LV 14 Serial no.: Model/Cat no.: [id number]  Implant name: PLATE ANT CERV XTEND 1 LV 14 - [id number] Laterality: N/A Area: Cervical Level 6-7  Manufacturer: [employer] Date of Manufacture:   Action: Implanted Number Used: 1   Device Identifier: Device Identifier Type:   SCREW XTD VAR 4.2 SELF TAP - [id number]  Inventory Item: SCREW XTD VAR 4.2 SELF TAP Serial no.: Model/Cat no.: [id number]  Implant name: SCREW XTD VAR 4.2 SELF TAP - [id number] Laterality: N/A Area: Cervical Level 6-7  Manufacturer: [employer] Date of Manufacture:   Action: Implanted Number Used: 4   Device Identifier: Device Identifier Type:   Procedure: After induction general anesthesia with glide scope and ovation wrist restraints neck was prepped with DuraPrep there is squared with towels sterile skin marker for incision starting at the midline extending to the left and Betadine Steri-Drape thyroid sheets and drapes.  Sterile Mayo stand at the head.  Timeout procedure completed Ancef was given prophylactically patient is not allergic to Ancef.  He does report a history of penicillin  allergy many years ago.  Call after timeout procedure incision started midline extending to the left blunt dissection after dividing the platysma down to the prominent spur at C5-6 and the next level down was localized using a short 25 needle with straight clamp confirmed with crosstable C arm.  Once the sterilely draped C-arm confirmed that we were at C6-7 level the disc was marked by taking large chunk of disc out using pituitary gland.  Self-retaining retractors were then placed underneath the elevated longus Coley right and left smooth blade cephalad caudad.  Discectomy was performed operative microscope was brought and we proceeded back to the posterior cortex the use of the bur and Cloward curettes were used.  Uncovertebral joints were stripped and 1 and 2 mm Kerrisons were used to take down the posterior spurs and chunks of disc protrusion which was central and right causing compression.  Should get better once dura was completely decompressed uncovertebral joints were stripped again with large curettes trial sizing's and then placement of an 8 mm cortical cancellous allograft with traction pulled by the CRNA.  Graft was countersunk 1 mm and 14 mm plate was selected placed adjusted and then screws were filled checked under C arm and locked down.  On AP plate was slightly rotated it was in good position on the lateral.  Negative irrigation tiny locking screwdriver locked all 4 screws down.  After copious irrigation the patient patient had closure platysma with 3-0 Vicryl 4-0 Vicryl subcuticular layer closure tincture benzoin Steri-Strips 4 x 4's tape and soft collar was applied with gauze cover.  Patient tolerated procedure well neurologically  intact and recovering.

## 2022-12-01 NOTE — Transfer of Care (Signed)
Immediate Anesthesia Transfer of Care Note  Patient: Joshua Robertson  Procedure(s) Performed: C6-7 ANTERIOR CERVICAL DISCECTOMY FUSION, ALLOGRAFT, PLATE  Patient Location: PACU  Anesthesia Type:General  Level of Consciousness: drowsy  Airway & Oxygen Therapy: Patient Spontanous Breathing and Patient connected to face mask oxygen  Post-op Assessment: Report given to RN and Post -op Vital signs reviewed and stable  Post vital signs: Reviewed and stable  Last Vitals:  Vitals Value Taken Time  BP 144/96 12/01/22 1522  Temp 36.1 C 12/01/22 1522  Pulse 67 12/01/22 1527  Resp 21 12/01/22 1527  SpO2 100 % 12/01/22 1527  Vitals shown include unvalidated device data.  Last Pain:  Vitals:   12/01/22 1106  TempSrc:   PainSc: 8       Patients Stated Pain Goal: 3 (12/01/22 1106)  Complications: No notable events documented.

## 2022-12-02 DIAGNOSIS — M50123 Cervical disc disorder at C6-C7 level with radiculopathy: Secondary | ICD-10-CM | POA: Diagnosis not present

## 2022-12-02 MED ORDER — METHOCARBAMOL 500 MG PO TABS: 500.0000 mg | ORAL_TABLET | Freq: Four times a day (QID) | ORAL | 0 refills | Status: AC | PRN

## 2022-12-02 MED ORDER — OXYCODONE-ACETAMINOPHEN 5-325 MG PO TABS: 1.0000 | ORAL_TABLET | Freq: Four times a day (QID) | ORAL | 0 refills | Status: AC | PRN

## 2022-12-02 NOTE — Evaluation (Signed)
Occupational Therapy Evaluation and Discharge Patient Details Name: Joshua Robertson MRN: [id number] DOB: 18-Dec-1975 Today's Date: 12/02/2022   History of Present Illness Pt is a 80 you male s/p C6-7 anterior cervical discectomy and fusion due to severe biforaminal stenosis at C6-7 with mild central stenosis C6-7. Mild changes C5-6 with some severe right foraminal stenosis which is not symptomatic.   Clinical Impression   This 47 yo male admitted and underwent above presents to acute OT with all education completed with patient and girlfriend as well as post op cervical handout provided. We will D/C from acute OT without need for follow up. No skilled PT needs identified, made PT aware.      Recommendations for follow up therapy are one component of a multi-disciplinary discharge planning process, led by the attending physician.  Recommendations may be updated based on patient status, additional functional criteria and insurance authorization.   Assistance Recommended at Discharge PRN  Patient can return home with the following Assistance with cooking/housework    Functional Status Assessment  Patient has had a recent decline in their functional status and demonstrates the ability to make significant improvements in function in a reasonable and predictable amount of time. (without further need for skilled OT services and post op cervical handout provided)  Equipment Recommendations  None recommended by OT       Precautions / Restrictions Precautions Precautions: Cervical Precaution Booklet Issued: Yes (comment) Required Braces or Orthoses: Cervical Brace Cervical Brace: Soft collar;At all times Restrictions Weight Bearing Restrictions: No      Mobility Bed Mobility Overal bed mobility: Modified Independent             General bed mobility comments: HOB up    Transfers Overall transfer level: Independent                        Balance Overall balance assessment:  Independent                                         ADL either performed or assessed with clinical judgement   ADL                                         General ADL Comments: Educated pt and girlfriend on donning/doffing pull over shirt as well as with LBD by bringing legs up to him not bending over, use of cups with straws to drink from, use of 2 cups for brushing teeth (one spit and one rinse with straw), protecting collar by placing a washcloth or cloth napking at front part with eating or brushing teeth, surgeon recommended they cover shower collar with saran wrap--I recommended Press-n-seal for this since it sticks together better, sleeping in a recliner or propped up the first 3-5 days, using HHS head or getting directly under the water to get rinsed off, not raising shoulders above shoulder height.     Vision Patient Visual Report: No change from baseline              Pertinent Vitals/Pain Pain Assessment Pain Assessment: 0-10 Pain Score: 4  Pain Location: left shoulder and arm (not hand)--hand still feels numb Pain Descriptors / Indicators: Sore, Aching Pain Intervention(s): Limited activity within patient's tolerance, Monitored during session  Hand Dominance Right   Extremity/Trunk Assessment Upper Extremity Assessment Upper Extremity Assessment: LUE deficits/detail LUE Deficits / Details: AROM WNL (but painful) with numbness still in fingers/hand (encouraged him to use LUE as much as possible)           Communication Communication Communication: No difficulties   Cognition Arousal/Alertness: Awake/alert Behavior During Therapy: WFL for tasks assessed/performed Overall Cognitive Status: Within Functional Limits for tasks assessed                                       General Comments  Pt reports he is not concerned about doing the steps at home. I did educate him and girlfriend on instead of doing a  step-over-step pattern he may want to do a step-too pattern that may make him feel more stable and use the hand rail            Home Living Family/patient expects to be discharged to:: Private residence Living Arrangements: Spouse/significant other Available Help at Discharge: Family;Friend(s);Available 24 hours/day Type of Home: Apartment Home Access: Stairs to enter Entrance Stairs-Number of Steps: 12 Entrance Stairs-Rails: Right Home Layout: One level     Bathroom Shower/Tub: Producer, television/film/video: Standard     Home Equipment: Hand held shower head          Prior Functioning/Environment Prior Level of Function : Independent/Modified Independent                        OT Problem List: Other (comment) (numbness in left hand)         OT Goals(Current goals can be found in the care plan section) Acute Rehab OT Goals Patient Stated Goal: to go home today         AM-PAC OT "6 Clicks" Daily Activity     Outcome Measure Help from another person eating meals?: None Help from another person taking care of personal grooming?: None Help from another person toileting, which includes using toliet, bedpan, or urinal?: None Help from another person bathing (including washing, rinsing, drying)?: None Help from another person to put on and taking off regular upper body clothing?: None Help from another person to put on and taking off regular lower body clothing?: None 6 Click Score: 24   End of Session Nurse Communication:  (no OT nor PT needs)  Activity Tolerance: Patient tolerated treatment well Patient left:  (getting up with girlfriend to get dressed)  OT Visit Diagnosis: Pain Pain - Right/Left: Left Pain - part of body: Arm                Time: 1610-9604 OT Time Calculation (min): 22 min Charges:  OT General Charges $OT Visit: 1 Visit OT Evaluation $OT Eval Moderate Complexity: 1 Mod Joshua L. OT Acute Rehabilitation Services Office  [phone]    Joshua Robertson 12/02/2022, 9:36 AM

## 2022-12-02 NOTE — Anesthesia Postprocedure Evaluation (Signed)
Anesthesia Post Note  Patient: Joshua Robertson  Procedure(s) Performed: C6-7 ANTERIOR CERVICAL DISCECTOMY FUSION, ALLOGRAFT, PLATE     Patient location during evaluation: PACU Anesthesia Type: General Level of consciousness: awake and alert Pain management: pain level controlled Vital Signs Assessment: post-procedure vital signs reviewed and stable Respiratory status: spontaneous breathing, nonlabored ventilation, respiratory function stable and patient connected to nasal cannula oxygen Cardiovascular status: blood pressure returned to baseline and stable Postop Assessment: no apparent nausea or vomiting Anesthetic complications: no   No notable events documented.  Last Vitals:  Vitals:   12/02/22 0344 12/02/22 0744  BP: (!) 142/86 (!) 147/98  Pulse: 64 (!) 59  Resp: 18 16  Temp: 36.7 C 36.8 C  SpO2: 100% 100%    Last Pain:  Vitals:   12/02/22 0744  TempSrc: Oral  PainSc:                  Collene Schlichter

## 2022-12-02 NOTE — TOC Transition Note (Signed)
Transition of Care [facility]) - CM/SW Discharge Note   Patient Details  Name: Joshua Robertson MRN: [id number] Date of Birth: [birth year]  [facility] of [facility]) CM/SW Contact:  Leone Haven, RN Phone Number: 12/02/2022, 10:40 AM   Clinical Narrative:    DC today, no needs.         Patient Goals and CMS Choice      Discharge Placement                         Discharge Plan and Services Additional resources added to the After Visit Summary for                                       Social Determinants of Health (SDOH) Interventions SDOH Screenings   Housing: Low Risk  (02/05/2022)  Transportation Needs: No Transportation Needs (02/05/2022)  Financial Resource Strain: Low Risk  (02/05/2022)  Tobacco Use: High Risk (11/30/2022)     Readmission Risk Interventions     No data to display

## 2022-12-02 NOTE — Progress Notes (Signed)
Patient ID: Infantof Bozzi, male   DOB: 11-20-1975, 47 y.o.   MRN: [id number]   Subjective: 1 Day Post-Op Procedure(s) (LRB): C6-7 ANTERIOR CERVICAL DISCECTOMY FUSION, ALLOGRAFT, PLATE (N/A) Patient reports pain as mild.    Objective: Vital signs in last 24 hours: Temp:  [97 F (36.1 C)-98.3 F (36.8 C)] 98.2 F (36.8 C) (06/20 0744) Pulse Rate:  [59-79] 59 (06/20 0744) Resp:  [16-22] 16 (06/20 0744) BP: (121-158)/(86-98) 147/98 (06/20 0744) SpO2:  [97 %-100 %] 100 % (06/20 0744) Weight:  [59 kg] 59 kg (06/19 1043)  Intake/Output from previous day: 06/19 0701 - 06/20 0700 In: -  Out: 60 [Drains:10; Blood:50] Intake/Output this shift: No intake/output data recorded.  No results for input(s): "HGB" in the last 72 hours. No results for input(s): "WBC", "RBC", "HCT", "PLT" in the last 72 hours. No results for input(s): "NA", "K", "CL", "CO2", "BUN", "CREATININE", "GLUCOSE", "CALCIUM" in the last 72 hours. No results for input(s): "LABPT", "INR" in the last 72 hours.  Neurologically intact DG Cervical Spine 2 or 3 views  Result Date: 12/01/2022 CLINICAL DATA:  C6-7 ACDF.  Intraoperative fluoroscopy. EXAM: CERVICAL SPINE - 2-3 VIEW COMPARISON:  Cervical spine radiographs 07/10/2022 FINDINGS: Images were performed intraoperatively without the presence of a radiologist. On the first image spinal instrumentation from anterior approach overlies the anterior C6-7 disc space. On the second image that is a frontal view of the cervical spine, there is ACDF hardware being placed at the C6-7 level. On the subsequent images this is completed. No hardware complication is seen. Total fluoroscopy images: 4 Total fluoroscopy time: 8 seconds Total dose: Radiation Exposure Index (as provided by the fluoroscopic device): 0.93 mGy air Kerma Please see intraoperative findings for further detail. IMPRESSION: Intraoperative fluoroscopy provided during C6-7 ACDF. Electronically Signed   By: Neita Garnet M.D.    On: 12/01/2022 17:54   DG C-Arm 1-60 Min-No Report  Result Date: 12/01/2022 Fluoroscopy was utilized by the requesting physician.  No radiographic interpretation.   DG C-Arm 1-60 Min-No Report  Result Date: 12/01/2022 Fluoroscopy was utilized by the requesting physician.  No radiographic interpretation.    Assessment/Plan: 1 Day Post-Op Procedure(s) (LRB): C6-7 ANTERIOR CERVICAL DISCECTOMY FUSION, ALLOGRAFT, PLATE (N/A) Plan:  home today. Office one week  Eldred Manges 12/02/2022, 8:05 AM

## 2022-12-02 NOTE — Plan of Care (Signed)
  Problem: Education: Goal: Knowledge of General Education information will improve Description Including pain rating scale, medication(s)/side effects and non-pharmacologic comfort measures Outcome: Adequate for Discharge   Problem: Health Behavior/Discharge Planning: Goal: Ability to manage health-related needs will improve Outcome: Adequate for Discharge   

## 2022-12-02 NOTE — TOC Initial Note (Signed)
Transition of Care [facility]) - Initial/Assessment Note    Patient Details  Name: Joshua Robertson MRN: [id number] Date of Birth: 1975-06-27  Transition of Care [facility]) CM/SW Contact:    Leone Haven, RN Phone Number: 12/02/2022, 10:39 AM  Clinical Narrative:                 From home, indep, has PCP and insurance on file. For dc today, no needs.        Patient Goals and CMS Choice            Expected Discharge Plan and Services         Expected Discharge Date: 12/02/22                                    Prior Living Arrangements/Services                       Activities of Daily Living      Permission Sought/Granted                  Emotional Assessment              Admission diagnosis:  S/P cervical spinal fusion [Z98.1] Patient Active Problem List   Diagnosis Date Noted   S/P cervical spinal fusion 12/01/2022   Foraminal stenosis of cervical region 10/28/2022   Protrusion of cervical intervertebral disc 09/16/2022   Lower abdominal pain    Crohn's disease of ileum with complication (HCC) 09/21/2021   Bowel perforation (HCC) 09/21/2021   Marijuana use 09/20/2021   Constipation 08/12/2020   Asymptomatic hypertensive urgency 07/25/2019   At risk for osteopenia 03/15/2018   Internal and external thrombosed hemorrhoids    Joint pain 07/27/2017   Hemorrhoids, internal, thrombosed 07/27/2017   HNP (herniated nucleus pulposus), lumbar 02/04/2017   Marijuana abuse 06/01/2016   Anemia 05/28/2016   Pain of left great toe 04/02/2015   Essential hypertension 10/22/2010   MIGRAINE HEADACHE 02/04/2009   Dermatophytosis of body 11/22/2008   PCP:  Richardean Chimera, MD Pharmacy:   [facility] [address] [place] - [address] [place] [address] Phone: [phone] Fax: [phone]  [facility], [facility] ([place] - [place], [place] - [address] [place] [address] [place] [address] Phone: [phone] Fax: [phone]     Social Determinants of Health (SDOH) Social History: SDOH Screenings   Housing: Low Risk  (02/05/2022)  Transportation Needs: No Transportation Needs (02/05/2022)  Financial Resource Strain: Low Risk  (02/05/2022)  Tobacco Use: High Risk (11/30/2022)   SDOH Interventions:     Readmission Risk Interventions     No data to display

## 2022-12-02 NOTE — Discharge Summary (Signed)
Physician Discharge Summary  Patient ID: Joshua Robertson MRN: [id number] DOB/AGE: 47-Jan-1977 47 y.o.  Admit date: 12/01/2022 Discharge date: 12/02/2022  Admission Diagnoses: C6-7 cervical disc protrusion with foraminal stenosis  Discharge Diagnoses: Same Principal Problem:   S/P cervical spinal fusion   Discharged Condition: Improved  Hospital Course: Patient was admitted after failed conservative treatment for C6-7 cervical fusion for central disc protrusion with mild central stenosis and severe foraminal stenosis left greater than right and left upper extremity symptoms.  He did have some foraminal narrowing worse on the right than left at C5-6.  Patient underwent cervical fusion postop drain removed postop day 1 he was amatory still had slight discomfort left upper extremity but improvement in his symptoms compared to preop.  Good motor strength dressing changed extra collar given for showering.  Office follow-up 1 week.  Consults: Physical therapy Occupational Therapy.  Significant Diagnostic Studies: Routine admission labs were unremarkable.  PCR was negative.  Treatments: Surgery as above.  Discharge Exam: Blood pressure (!) 147/98, pulse (!) 59, temperature 98.2 F (36.8 C), temperature source Oral, resp. rate 16, height 5\' 9"  (1.753 m), weight 59 kg, SpO2 100 %. Physical exam showed normal motor strength postop neck was soft dressing was changed, Hemovac was removed.  Patient was amatory with good strength.  Disposition: Discharge disposition: 01-Home or Self Care       Discharge Instructions     Incentive spirometry RT   Complete by: As directed       Allergies as of 12/02/2022       Reactions   Penicillins Swelling, Rash   Patient states that he had face swelling as a child.  PATIENT HAS HAD A PCN REACTION WITH IMMEDIATE RASH, FACIAL/TONGUE/THROAT SWELLING, SOB, OR LIGHTHEADEDNESS WITH HYPOTENSION:  #    #  YES  #   #   HAS PT DEVELOPED SEVERE RASH INVOLVING  MUCUS MEMBRANES or SKIN NECROSIS: # #  YES  # #  Has patient had a PCN reaction that required hospitalization No Has patient had a PCN reaction occurring w/in the last 10 years: No If all of the above answers are "NO", then may proceed with Cephalosporin use.   Azathioprine Nausea And Vomiting   Iodinated Contrast Media Nausea And Vomiting   Metrizamide Nausea And Vomiting   Pentasa [mesalamine Er] Rash        Medication List     STOP taking these medications    Cimzia 2 X 200 MG Kit Generic drug: certolizumab pegol   ciprofloxacin 500 MG tablet Commonly known as: CIPRO   HYDROcodone-acetaminophen 5-325 MG tablet Commonly known as: NORCO/VICODIN   metroNIDAZOLE 500 MG tablet Commonly known as: FLAGYL   ondansetron 4 MG disintegrating tablet Commonly known as: ZOFRAN-ODT   predniSONE 10 MG tablet Commonly known as: DELTASONE       TAKE these medications    amLODipine 10 MG tablet Commonly known as: NORVASC Take 1 tablet by mouth daily.   cholecalciferol 1000 units tablet Commonly known as: VITAMIN D Take 1 tablet (1,000 Units total) by mouth daily. BEGIN AFTER WEEKLY VIT D DOSING IS COMPLETE   cloNIDine 0.1 MG tablet Commonly known as: Catapres Take 1 tablet (0.1 mg total) by mouth 2 (two) times daily.   methocarbamol 500 MG tablet Commonly known as: ROBAXIN Take 1 tablet (500 mg total) by mouth every 6 (six) hours as needed for muscle spasms.   oxyCODONE-acetaminophen 5-325 MG tablet Commonly known as: Percocet Take 1-2 tablets by mouth every  6 (six) hours as needed for severe pain.         Signed: Eldred Manges 12/02/2022, 9:53 AM

## 2022-12-03 ENCOUNTER — Encounter (HOSPITAL_COMMUNITY): Payer: Self-pay | Admitting: Orthopaedic Surgery

## 2022-12-08 ENCOUNTER — Encounter: Payer: Self-pay | Admitting: Orthopaedic Surgery

## 2022-12-08 ENCOUNTER — Other Ambulatory Visit (INDEPENDENT_AMBULATORY_CARE_PROVIDER_SITE_OTHER): Payer: Medicare Other

## 2022-12-08 ENCOUNTER — Ambulatory Visit (INDEPENDENT_AMBULATORY_CARE_PROVIDER_SITE_OTHER): Payer: Medicare Other | Admitting: Orthopaedic Surgery

## 2022-12-08 VITALS — Ht 69.0 in | Wt 130.0 lb

## 2022-12-08 DIAGNOSIS — Z981 Arthrodesis status: Secondary | ICD-10-CM | POA: Diagnosis not present

## 2022-12-08 NOTE — Progress Notes (Signed)
Post-Op Visit Note   Patient: Joshua Robertson           Date of Birth: 1976-05-21           MRN: [id number] Visit Date: 12/08/2022 PCP: Richardean Chimera, MD   Assessment & Plan: Follow up C6-7 fusion.  Preop left arm symptoms are gone he has little bit of tingling in his right arm.  X-rays look good Steri-Strips changed.  Patient is on disability and no work note needed.  Return 5 weeks first lateral flexion-extension x-rays  Chief Complaint:  Chief Complaint  Patient presents with   Neck - Routine Post Op   Visit Diagnoses:  1. Status post cervical spinal fusion     Plan: Return in 5 weeks with lateral flexion-extension x-rays.  Follow-Up Instructions: No follow-ups on file.   Orders:  Orders Placed This Encounter  Procedures   XR Cervical Spine 2 or 3 views   No orders of the defined types were placed in this encounter.   Imaging: No results found.  PMFS History: Patient Active Problem List   Diagnosis Date Noted   S/P cervical spinal fusion 12/01/2022   Foraminal stenosis of cervical region 10/28/2022   Protrusion of cervical intervertebral disc 09/16/2022   Lower abdominal pain    Crohn's disease of ileum with complication (HCC) 09/21/2021   Bowel perforation (HCC) 09/21/2021   Marijuana use 09/20/2021   Constipation 08/12/2020   Asymptomatic hypertensive urgency 07/25/2019   At risk for osteopenia 03/15/2018   Internal and external thrombosed hemorrhoids    Joint pain 07/27/2017   Hemorrhoids, internal, thrombosed 07/27/2017   HNP (herniated nucleus pulposus), lumbar 02/04/2017   Marijuana abuse 06/01/2016   Anemia 05/28/2016   Pain of left great toe 04/02/2015   Essential hypertension 10/22/2010   MIGRAINE HEADACHE 02/04/2009   Dermatophytosis of body 11/22/2008   Past Medical History:  Diagnosis Date   BMI between 19-24,adult JAN 2010 149 LBS   Complication of anesthesia    Woke up during colonoscopy   Crohn's disease (HCC) AUG 2009 NUR [facility]   HB  9.4 FERRITIN 26  140 LBS; no serologies-pt can't afford test   Family history of adverse reaction to anesthesia    Grandmother was hard to wake up   Helicobacter pylori gastritis 2009   Hypertension    Migraine headache    NONE SINCE AGE 67   Sacroiliitis (HCC) 2009   Sickle cell trait (HCC)    Spondyloarthropathy 2009 EROSIVE    Family History  Problem Relation Age of Onset   Hypertension Mother    Hypertension Father    Colon cancer Neg Hx    Colon polyps Neg Hx    Inflammatory bowel disease Neg Hx     Past Surgical History:  Procedure Laterality Date   ANTERIOR CERVICAL DECOMP/DISCECTOMY FUSION N/A 12/01/2022   Procedure: C6-7 ANTERIOR CERVICAL DISCECTOMY FUSION, ALLOGRAFT, PLATE;  Surgeon: Eldred Manges, MD;  Location: [facility] OR;  Service: Orthopedics;  Laterality: N/A;  Needs RNFA   BACK SURGERY     2018  outpatient x2   BIOPSY  09/06/2017   Procedure: BIOPSY;  Surgeon: West Bali, MD;  Location: [facility];  Service: Endoscopy;;  colon ileum   BIOPSY  04/18/2020   Procedure: BIOPSY;  Surgeon: Lanelle Bal, DO;  Location: [facility];  Service: Endoscopy;;   COLON RESECTION     due to chrohn's   COLONOSCOPY  03/2015   [facility]: Crohn's  like stricture at ICV. Stricture was not traversable. abnormal mucosa of ICV. Bx, c/w Crohn's.    COLONOSCOPY N/A 07/30/2013   stenotic IC VALVE. UNABLE TO INTUBATE ILEUM.   COLONOSCOPY WITH PROPOFOL N/A 09/06/2017   Procedure: COLONOSCOPY WITH PROPOFOL;  Surgeon: West Bali, MD;  Location: [facility];  Service: Endoscopy;  Laterality: N/A;  7:30am   COLONOSCOPY WITH PROPOFOL N/A 04/18/2020   Dr. Marletta Lor: Nonbleeding internal hemorrhoids, patent end-to-side ileocolonic anastomosis characterized by erosion, erythema, inflammation and ulceration.  Biopsies showed nonspecific chronic inflammation at the anastomosis.   HEMORRHOID SURGERY N/A 08/01/2017   Procedure: HEMORRHOIDECTOMY;  Surgeon: Franky Macho, MD;  Location:  [facility];  Service: General;  Laterality: N/A;   LUMBAR LAMINECTOMY/DECOMPRESSION MICRODISCECTOMY Right 02/04/2017   Procedure: Microdiscectomy - Lumbar four-five - right redo;  Surgeon: Donalee Citrin, MD;  Location[facility] OR;  Service: Neurosurgery;  Laterality: Right;   Social History   Occupational History   Occupation: unemployed  Tobacco Use   Smoking status: Every Day    Packs/day: .25    Types: Cigarettes   Smokeless tobacco: Never   Tobacco comments:    6 cig daily as of 07/29/2017  Vaping Use   Vaping Use: Never used  Substance and Sexual Activity   Alcohol use: Yes    Alcohol/week: 0.0 standard drinks of alcohol    Comment: very rare   Drug use: Yes    Types: Marijuana    Comment: 2-3 times daily Helps appetite and stomach.   Sexual activity: Yes    Birth control/protection: None

## 2023-01-12 ENCOUNTER — Other Ambulatory Visit (INDEPENDENT_AMBULATORY_CARE_PROVIDER_SITE_OTHER): Payer: Medicare Other

## 2023-01-12 ENCOUNTER — Ambulatory Visit (INDEPENDENT_AMBULATORY_CARE_PROVIDER_SITE_OTHER): Payer: Medicare Other | Admitting: Orthopaedic Surgery

## 2023-01-12 ENCOUNTER — Encounter: Payer: Self-pay | Admitting: Orthopaedic Surgery

## 2023-01-12 VITALS — Ht 69.0 in | Wt 130.0 lb

## 2023-01-12 DIAGNOSIS — Z981 Arthrodesis status: Secondary | ICD-10-CM | POA: Diagnosis not present

## 2023-01-12 NOTE — Progress Notes (Signed)
Post-Op Visit Note   Patient: Joshua Robertson           Date of Birth: 1975/06/27           MRN: [id number] Visit Date: 01/12/2023 PCP: Richardean Chimera, MD   Assessment & Plan: Follow-up C6-7 fusion.  He denies pain Seshan hands intact.  Flexion-extension x-rays show no motion and good graft incorporation incisions well-healed.  He is released from care and can return on as-needed basis.  Chief Complaint:  Chief Complaint  Patient presents with   Neck - Follow-up, Routine Post Op    12/01/2022 C6-7 ACDF   Visit Diagnoses:  1. Status post cervical spinal fusion     Plan: Patient happy with the results surgery is released from care DC collar return as needed.  Follow-Up Instructions: No follow-ups on file.   Orders:  Orders Placed This Encounter  Procedures   XR Cervical Spine 2 or 3 views   No orders of the defined types were placed in this encounter.   Imaging: XR Cervical Spine 2 or 3 views  Result Date: 01/12/2023 [facility] and lateral flexion-extension cervical spine images are obtained and reviewed this shows fusion C6-7 with incorporation no motion on flexion-extension radiographs. Impression: Satisfactory C6-7 fusion without motion.  Graft incorporation is noted radiographically.   PMFS History: Patient Active Problem List   Diagnosis Date Noted   S/P cervical spinal fusion 12/01/2022   Foraminal stenosis of cervical region 10/28/2022   Protrusion of cervical intervertebral disc 09/16/2022   Lower abdominal pain    Crohn's disease of ileum with complication (HCC) 09/21/2021   Bowel perforation (HCC) 09/21/2021   Marijuana use 09/20/2021   Constipation 08/12/2020   Asymptomatic hypertensive urgency 07/25/2019   At risk for osteopenia 03/15/2018   Internal and external thrombosed hemorrhoids    Joint pain 07/27/2017   Hemorrhoids, internal, thrombosed 07/27/2017   HNP (herniated nucleus pulposus), lumbar 02/04/2017   Marijuana abuse 06/01/2016   Anemia 05/28/2016    Pain of left great toe 04/02/2015   Essential hypertension 10/22/2010   MIGRAINE HEADACHE 02/04/2009   Dermatophytosis of body 11/22/2008   Past Medical History:  Diagnosis Date   BMI between 19-24,adult JAN 2010 149 LBS   Complication of anesthesia    Woke up during colonoscopy   Crohn's disease (HCC) AUG 2009 NUR [facility]   HB 9.4 FERRITIN 26  140 LBS; no serologies-pt can't afford test   Family history of adverse reaction to anesthesia    Grandmother was hard to wake up   Helicobacter pylori gastritis 2009   Hypertension    Migraine headache    NONE SINCE AGE 73   Sacroiliitis (HCC) 2009   Sickle cell trait (HCC)    Spondyloarthropathy 2009 EROSIVE    Family History  Problem Relation Age of Onset   Hypertension Mother    Hypertension Father    Colon cancer Neg Hx    Colon polyps Neg Hx    Inflammatory bowel disease Neg Hx     Past Surgical History:  Procedure Laterality Date   ANTERIOR CERVICAL DECOMP/DISCECTOMY FUSION N/A 12/01/2022   Procedure: C6-7 ANTERIOR CERVICAL DISCECTOMY FUSION, ALLOGRAFT, PLATE;  Surgeon: Eldred Manges, MD;  Location: [facility] OR;  Service: Orthopedics;  Laterality: N/A;  Needs RNFA   BACK SURGERY     2018  outpatient x2   BIOPSY  09/06/2017   Procedure: BIOPSY;  Surgeon: West Bali, MD;  [facility]: [facility];  Service: Endoscopy;;  colon  ileum   BIOPSY  04/18/2020   Procedure: BIOPSY;  Surgeon: Lanelle Bal, DO;  [facility]: [facility];  Service: Endoscopy;;   COLON RESECTION     due to chrohn's   COLONOSCOPY  03/2015   [facility]: Crohn's like stricture at ICV. Stricture was not traversable. abnormal mucosa of ICV. Bx, c/w Crohn's.    COLONOSCOPY N/A 07/30/2013   stenotic IC VALVE. UNABLE TO INTUBATE ILEUM.   COLONOSCOPY WITH PROPOFOL N/A 09/06/2017   Procedure: COLONOSCOPY WITH PROPOFOL;  Surgeon: West Bali, MD;  Location: [facility];  Service: Endoscopy;  Laterality: N/A;  7:30am   COLONOSCOPY WITH PROPOFOL N/A  04/18/2020   Dr. Marletta Lor: Nonbleeding internal hemorrhoids, patent end-to-side ileocolonic anastomosis characterized by erosion, erythema, inflammation and ulceration.  Biopsies showed nonspecific chronic inflammation at the anastomosis.   HEMORRHOID SURGERY N/A 08/01/2017   Procedure: HEMORRHOIDECTOMY;  Surgeon: Franky Macho, MD;  Location: [facility];  Service: General;  Laterality: N/A;   LUMBAR LAMINECTOMY/DECOMPRESSION MICRODISCECTOMY Right 02/04/2017   Procedure: Microdiscectomy - Lumbar four-five - right redo;  Surgeon: Donalee Citrin, MD;  Location: [facility] OR;  Service: Neurosurgery;  Laterality: Right;   Social History   Occupational History   Occupation: unemployed  Tobacco Use   Smoking status: Every Day    Current packs/day: 0.25    Types: Cigarettes   Smokeless tobacco: Never   Tobacco comments:    6 cig daily as of 07/29/2017  Vaping Use   Vaping status: Never Used  Substance and Sexual Activity   Alcohol use: Yes    Alcohol/week: 0.0 standard drinks of alcohol    Comment: very rare   Drug use: Yes    Types: Marijuana    Comment: 2-3 times daily Helps appetite and stomach.   Sexual activity: Yes    Birth control/protection: None
